# Patient Record
Sex: Male | Born: 1950 | Race: Black or African American | Hispanic: No | Marital: Single | Smoking: Former smoker
Health system: Southern US, Community
[De-identification: ages and names within clinical notes are randomized; demographics above are authoritative.]

## PROBLEM LIST (undated history)

## (undated) ENCOUNTER — Emergency Department: Discharge status: Absent without leave | Payer: Medicare Other

## (undated) DIAGNOSIS — Z87891 Personal history of nicotine dependence: Principal | ICD-10-CM

## (undated) DIAGNOSIS — M1990 Unspecified osteoarthritis, unspecified site: Secondary | ICD-10-CM

## (undated) DIAGNOSIS — C109 Malignant neoplasm of oropharynx, unspecified: Secondary | ICD-10-CM

## (undated) DIAGNOSIS — E119 Type 2 diabetes mellitus without complications: Secondary | ICD-10-CM

## (undated) HISTORY — DX: Personal history of nicotine dependence: Z87.891

## (undated) HISTORY — DX: Malignant neoplasm of oropharynx, unspecified: C10.9

## (undated) HISTORY — DX: Unspecified osteoarthritis, unspecified site: M19.90

## (undated) HISTORY — DX: Type 2 diabetes mellitus without complications: E11.9

---

## 1992-07-05 HISTORY — PX: THORACIC DISC SURGERY: SHX801

## 2007-07-06 DIAGNOSIS — C109 Malignant neoplasm of oropharynx, unspecified: Secondary | ICD-10-CM

## 2007-07-06 DIAGNOSIS — Z931 Gastrostomy status: Secondary | ICD-10-CM

## 2007-07-06 DIAGNOSIS — K9423 Gastrostomy malfunction: Secondary | ICD-10-CM

## 2007-07-06 HISTORY — DX: Malignant neoplasm of oropharynx, unspecified: C10.9

## 2007-07-12 ENCOUNTER — Emergency Department: Payer: Self-pay

## 2008-02-04 ENCOUNTER — Other Ambulatory Visit: Payer: Self-pay

## 2008-02-04 ENCOUNTER — Emergency Department: Payer: Self-pay | Admitting: Internal Medicine

## 2008-06-20 ENCOUNTER — Ambulatory Visit: Payer: Self-pay | Admitting: Gastroenterology

## 2015-08-29 ENCOUNTER — Ambulatory Visit: Payer: Self-pay | Admitting: Internal Medicine

## 2015-09-12 ENCOUNTER — Inpatient Hospital Stay: Payer: Medicare HMO | Attending: Internal Medicine | Admitting: Internal Medicine

## 2015-09-12 VITALS — BP 156/94 | HR 65 | Temp 95.8°F | Resp 18 | Wt 156.1 lb

## 2015-09-12 DIAGNOSIS — Z87891 Personal history of nicotine dependence: Secondary | ICD-10-CM | POA: Diagnosis not present

## 2015-09-12 DIAGNOSIS — E119 Type 2 diabetes mellitus without complications: Secondary | ICD-10-CM | POA: Insufficient documentation

## 2015-09-12 DIAGNOSIS — M26609 Unspecified temporomandibular joint disorder, unspecified side: Secondary | ICD-10-CM

## 2015-09-12 DIAGNOSIS — Z85819 Personal history of malignant neoplasm of unspecified site of lip, oral cavity, and pharynx: Secondary | ICD-10-CM | POA: Diagnosis not present

## 2015-09-12 DIAGNOSIS — C760 Malignant neoplasm of head, face and neck: Secondary | ICD-10-CM

## 2015-09-12 DIAGNOSIS — Z923 Personal history of irradiation: Secondary | ICD-10-CM | POA: Insufficient documentation

## 2015-09-12 DIAGNOSIS — Z79899 Other long term (current) drug therapy: Secondary | ICD-10-CM | POA: Diagnosis not present

## 2015-09-12 DIAGNOSIS — Z8042 Family history of malignant neoplasm of prostate: Secondary | ICD-10-CM | POA: Insufficient documentation

## 2015-09-12 DIAGNOSIS — Z9221 Personal history of antineoplastic chemotherapy: Secondary | ICD-10-CM | POA: Insufficient documentation

## 2015-09-12 NOTE — Progress Notes (Signed)
Patient here today as new evaluation regarding hx of throat cancer.  Referred by Dr. Brynda Greathouse.  Patient initially treated @ [facility].

## 2015-09-12 NOTE — Progress Notes (Signed)
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 Couch]; PET 2009- NED.   # 2009- ? Lung nodules ~1cm [on follow up PET]- March 2017-refer to Lung cancer screening program  # ? Bil TMJ  HISTORY OF PRESENTING ILLNESS:  Louis Stone 65 y.o.  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   Patient denies any unusual throat pain or difficulty swallowing or lumps or bumps. His appetite is good. Denies any  Chest pain or shortness of breath or cough.  Denies any dry mouth. Denies any tingling and numbness of his extremity.   Patient denies any unusual fatigue or tiredness.   No constipation.  ROS: A complete 10 point review of system is done which is negative except mentioned above in history of present illness  MEDICAL HISTORY:  Past Medical History  Diagnosis Date  . Oropharynx cancer ([place]) 2009    squamous cell cancer  Stage T3, N2c, M0  . Diabetes mellitus without complication ([place])   . Arthritis     per pt. I do not have md notes that indicate dx.    SURGICAL HISTORY: Past Surgical History  Procedure Laterality Date  . Thoracic disc surgery  1994    pt states ruptured disc and had surgery to repair it    SOCIAL HISTORY:  2 packs a day for  40 years;  Quit in 2008. Quit alcohol in 1991. Social History   Social History  . Marital Status: Single    Spouse Name: N/A  . Number of Children: N/A  . Years of Education: N/A   Occupational History  . Not on file.   Social History Main Topics  . Smoking status: Former Smoker -- 2.00 packs/day for 40 years    Quit date: 07/05/2006   . Smokeless tobacco: Not on file  . Alcohol Use: No     Comment: quit in 1991  . Drug Use: Not on file  . Sexual Activity: Not on file   Other Topics Concern  . Not on file   Social History Narrative  . No narrative on file    FAMILY HISTORY:  Prostate cancer brother. Family History  Problem Relation Age of Onset  . Prostate cancer Neg Hx     not sure what family member    ALLERGIES:  has no allergies on file.  MEDICATIONS:  Current Outpatient Prescriptions  Medication Sig Dispense Refill  . traMADol (ULTRAM) 50 MG tablet Take by mouth every 6 (six) hours as needed.    Nelva Nay SOLOSTAR 300 UNIT/ML SOPN      No current facility-administered medications for this visit.      Marland Kitchen  PHYSICAL EXAMINATION: ECOG PERFORMANCE STATUS: 0 - Asymptomatic  Filed Vitals:   09/12/15 1120  BP: 156/94  Pulse: 65  Temp: 95.8 F (35.4 C)  Resp: 18   Filed Weights   09/12/15 1120  Weight: 156 lb 1.4 oz (70.8 kg)    GENERAL: Well-nourished well-developed; Alert, no distress and comfortable. Alone.  EYES: no pallor or icterus OROPHARYNX: no thrush or ulceration; dentures.  NECK: supple, no masses felt LYMPH:  no palpable lymphadenopathy in the  cervical, axillary or inguinal regions LUNGS: clear to auscultation and  No wheeze or crackles HEART/CVS: regular rate & rhythm and no murmurs; No lower extremity edema ABDOMEN: abdomen soft, non-tender and normal bowel sounds Musculoskeletal:no cyanosis of digits and no clubbing  PSYCH: alert & oriented x 3 with fluent speech NEURO: no focal motor/sensory deficits SKIN:  no rashes or significant lesions  .  ASSESSMENT & PLAN:   #  Squamous cell carcinoma of the oropharynx-  Status post chemoradiation [ 2009].  Clinically no evidence of recurrence.  Recommend ENT evaluation.  Recommend CBC CMP TSH today.   #   History of smoking quit many years ago-  Patient is likely a candidate for  Lung cancer screening.  Patient is interested.   Will refer to the  Lung cancer screening program.  #  Patient follow-up with me  In 6 months/ no labs;  However  If labs today are abnormal he was see  Sooner.  # 30 minutes face-to-face with the patient discussing the above plan of care; more than 50% of time spent on counseling and coordination.  Thank you Dr. Brynda Greathouse  for allowing me to participate in the care of your pleasant patient. Please do not hesitate to contact me with questions or concerns in the interim.      Cammie Sickle, MD 09/16/2015 3:18 PM

## 2015-09-14 ENCOUNTER — Encounter: Payer: Self-pay | Admitting: Internal Medicine

## 2015-09-15 ENCOUNTER — Telehealth: Payer: Self-pay | Admitting: *Deleted

## 2015-09-15 NOTE — Telephone Encounter (Signed)
Called pt tto let him know about ENT appt with Dr. Pryor Ochoa on 10/01/15 3 pm arrival and 3:30 md visit.  I spoke to pt and told him this and then he asked what it will cost.  I told him that he needs to call them because depending on his insurance and if he has copay would be the deciding factor and I offered to give him the number and he will have to call me back he is in the middle of something and can't take down the phone number.  I had also asked him to come over and get labs done but he says he has them all the time at [place] office.  I called there and got latest labs 02/03/2015 and I showed them to Dr. B and he is ok with them.

## 2015-09-22 NOTE — Telephone Encounter (Signed)
Called back on 3/15 and pt was able to take down phone number of ENT in case he had questions. He already knew where to go because when he was in our clinic I took him to the window and showed him the [facility] where ENT was located.

## 2015-10-03 ENCOUNTER — Encounter: Payer: Self-pay | Admitting: *Deleted

## 2015-10-08 ENCOUNTER — Inpatient Hospital Stay: Payer: Medicare HMO | Attending: Family Medicine | Admitting: Family Medicine

## 2015-10-08 ENCOUNTER — Ambulatory Visit
Admission: RE | Admit: 2015-10-08 | Discharge: 2015-10-08 | Disposition: A | Discharge status: Home / Self Care | Payer: Medicare HMO | Source: Ambulatory Visit | Attending: Family Medicine | Admitting: Family Medicine

## 2015-10-08 ENCOUNTER — Encounter: Payer: Self-pay | Admitting: Family Medicine

## 2015-10-08 ENCOUNTER — Other Ambulatory Visit: Payer: Self-pay | Admitting: Family Medicine

## 2015-10-08 DIAGNOSIS — Z87891 Personal history of nicotine dependence: Secondary | ICD-10-CM

## 2015-10-08 DIAGNOSIS — Z122 Encounter for screening for malignant neoplasm of respiratory organs: Secondary | ICD-10-CM | POA: Diagnosis not present

## 2015-10-08 HISTORY — DX: Personal history of nicotine dependence: Z87.891

## 2015-10-08 NOTE — Progress Notes (Signed)
In accordance with CMS guidelines, patient has meet eligibility criteria including age, absence of signs or symptoms of lung cancer, the specific calculation of cigarette smoking pack-years was 60 years and is a former smoker having quit 9 years ago.   A shared decision-making session was conducted prior to the performance of CT scan. This includes one or more decision aids, includes benefits and harms of screening, follow-up diagnostic testing, over-diagnosis, false positive rate, and total radiation exposure.  Counseling on the importance of adherence to annual lung cancer LDCT screening, impact of co-morbidities, and ability or willingness to undergo diagnosis and treatment is imperative for compliance of the program.  Counseling on the importance of continued smoking cessation for former smokers; the importance of smoking cessation for current smokers and information about tobacco cessation interventions have been given to patient including the [place] at [facility], 1800 quit [place], as well as [place] specific smoking cessation programs.  Written order for lung cancer screening with LDCT has been given to the patient and any and all questions have been answered to the best of my abilities.   Yearly follow up will be scheduled by Burgess Estelle, Thoracic Navigator.

## 2015-10-10 ENCOUNTER — Telehealth: Payer: Self-pay | Admitting: *Deleted

## 2015-10-10 NOTE — Telephone Encounter (Signed)
Notified patient of LDCT lung cancer screening results of Lung [place] 1 finding with recommendation for 12 month follow up imaging. Patient verbalizes understanding.

## 2016-03-15 ENCOUNTER — Inpatient Hospital Stay: Payer: Medicare HMO | Admitting: Internal Medicine

## 2016-04-02 ENCOUNTER — Ambulatory Visit: Payer: Medicare HMO | Admitting: Internal Medicine

## 2016-09-16 ENCOUNTER — Inpatient Hospital Stay
Admission: EM | Admit: 2016-09-16 | Discharge: 2016-09-18 | DRG: 871 | Disposition: A | Discharge status: Home Health | Payer: Medicare HMO | Attending: Internal Medicine | Admitting: Internal Medicine

## 2016-09-16 ENCOUNTER — Emergency Department: Payer: Medicare HMO

## 2016-09-16 ENCOUNTER — Encounter: Payer: Self-pay | Admitting: Emergency Medicine

## 2016-09-16 DIAGNOSIS — Z87891 Personal history of nicotine dependence: Secondary | ICD-10-CM

## 2016-09-16 DIAGNOSIS — E870 Hyperosmolality and hypernatremia: Secondary | ICD-10-CM | POA: Diagnosis present

## 2016-09-16 DIAGNOSIS — E860 Dehydration: Secondary | ICD-10-CM | POA: Diagnosis present

## 2016-09-16 DIAGNOSIS — Z79899 Other long term (current) drug therapy: Secondary | ICD-10-CM | POA: Diagnosis not present

## 2016-09-16 DIAGNOSIS — E1165 Type 2 diabetes mellitus with hyperglycemia: Secondary | ICD-10-CM | POA: Diagnosis present

## 2016-09-16 DIAGNOSIS — R6520 Severe sepsis without septic shock: Secondary | ICD-10-CM | POA: Diagnosis present

## 2016-09-16 DIAGNOSIS — Z7951 Long term (current) use of inhaled steroids: Secondary | ICD-10-CM | POA: Diagnosis not present

## 2016-09-16 DIAGNOSIS — A419 Sepsis, unspecified organism: Principal | ICD-10-CM | POA: Diagnosis present

## 2016-09-16 DIAGNOSIS — J189 Pneumonia, unspecified organism: Secondary | ICD-10-CM

## 2016-09-16 DIAGNOSIS — N179 Acute kidney failure, unspecified: Secondary | ICD-10-CM | POA: Diagnosis present

## 2016-09-16 DIAGNOSIS — E11649 Type 2 diabetes mellitus with hypoglycemia without coma: Secondary | ICD-10-CM | POA: Diagnosis present

## 2016-09-16 DIAGNOSIS — R739 Hyperglycemia, unspecified: Secondary | ICD-10-CM

## 2016-09-16 DIAGNOSIS — R531 Weakness: Secondary | ICD-10-CM

## 2016-09-16 DIAGNOSIS — C760 Malignant neoplasm of head, face and neck: Secondary | ICD-10-CM

## 2016-09-16 DIAGNOSIS — J181 Lobar pneumonia, unspecified organism: Secondary | ICD-10-CM

## 2016-09-16 DIAGNOSIS — Z85818 Personal history of malignant neoplasm of other sites of lip, oral cavity, and pharynx: Secondary | ICD-10-CM | POA: Diagnosis not present

## 2016-09-16 LAB — URINALYSIS, COMPLETE (UACMP) WITH MICROSCOPIC
Bacteria, UA: NONE SEEN
Bilirubin Urine: NEGATIVE
Glucose, UA: >=500 mg/dL — AB
Ketones, ur: 5 mg/dL — AB
Leukocytes, UA: NEGATIVE
Nitrite: NEGATIVE
Protein, ur: NEGATIVE mg/dL
Specific Gravity, Urine: 1.027 (ref 1.005–1.030)
Squamous Epithelial / LPF: NONE SEEN
pH: 5 (ref 5.0–8.0)

## 2016-09-16 LAB — GLUCOSE, CAPILLARY
Glucose-Capillary: 353 mg/dL — ABNORMAL HIGH (ref 65–99)
Glucose-Capillary: 298 mg/dL — ABNORMAL HIGH (ref 65–99)
Glucose-Capillary: 254 mg/dL — ABNORMAL HIGH (ref 65–99)
Glucose-Capillary: 246 mg/dL — ABNORMAL HIGH (ref 65–99)

## 2016-09-16 LAB — CBC WITH DIFFERENTIAL/PLATELET
Basophils Absolute: 0.1 10*3/uL (ref 0–0.1)
Basophils Relative: 0 %
Eosinophils Absolute: 0 10*3/uL (ref 0–0.7)
Eosinophils Relative: 0 %
HCT: 43.5 % (ref 40.0–52.0)
Hemoglobin: 13.6 g/dL (ref 13.0–18.0)
Lymphocytes Relative: 3 %
Lymphs Abs: 0.6 10*3/uL — ABNORMAL LOW (ref 1.0–3.6)
MCH: 26.2 pg (ref 26.0–34.0)
MCHC: 31.2 g/dL — ABNORMAL LOW (ref 32.0–36.0)
MCV: 84 fL (ref 80.0–100.0)
Monocytes Absolute: 0.4 10*3/uL (ref 0.2–1.0)
Monocytes Relative: 3 %
Neutro Abs: 15.9 10*3/uL — ABNORMAL HIGH (ref 1.4–6.5)
Neutrophils Relative %: 94 %
Platelets: 295 10*3/uL (ref 150–440)
RBC: 5.17 MIL/uL (ref 4.40–5.90)
RDW: 16.6 % — ABNORMAL HIGH (ref 11.5–14.5)
WBC: 16.9 10*3/uL — ABNORMAL HIGH (ref 3.8–10.6)

## 2016-09-16 LAB — COMPREHENSIVE METABOLIC PANEL
ALT: 39 U/L (ref 17–63)
AST: 33 U/L (ref 15–41)
Albumin: 2.5 g/dL — ABNORMAL LOW (ref 3.5–5.0)
Alkaline Phosphatase: 225 U/L — ABNORMAL HIGH (ref 38–126)
Anion gap: 12 (ref 5–15)
BUN: 65 mg/dL — ABNORMAL HIGH (ref 6–20)
CO2: 23 mmol/L (ref 22–32)
Calcium: 8.7 mg/dL — ABNORMAL LOW (ref 8.9–10.3)
Chloride: 117 mmol/L — ABNORMAL HIGH (ref 101–111)
Creatinine, Ser: 1.53 mg/dL — ABNORMAL HIGH (ref 0.61–1.24)
GFR calc Af Amer: 54 mL/min — ABNORMAL LOW (ref >60)
GFR calc non Af Amer: 46 mL/min — ABNORMAL LOW (ref >60)
Glucose, Bld: 553 mg/dL (ref 65–99)
Potassium: 4.4 mmol/L (ref 3.5–5.1)
Sodium: 152 mmol/L — ABNORMAL HIGH (ref 135–145)
Total Bilirubin: 1.4 mg/dL — ABNORMAL HIGH (ref 0.3–1.2)
Total Protein: 7.6 g/dL (ref 6.5–8.1)

## 2016-09-16 LAB — LACTIC ACID, PLASMA: Lactic Acid, Venous: 1.9 mmol/L (ref 0.5–1.9)

## 2016-09-16 LAB — ETHANOL: Alcohol, Ethyl (B): <5 mg/dL (ref <5)

## 2016-09-16 LAB — LIPASE, BLOOD: Lipase: 34 U/L (ref 11–51)

## 2016-09-16 MED ORDER — INSULIN ASPART 100 UNIT/ML ~~LOC~~ SOLN
0.0000 [IU] | Freq: Three times a day (TID) | SUBCUTANEOUS | Status: DC
  Administered 2016-09-17: 7 [IU] via SUBCUTANEOUS
  Administered 2016-09-17 (×2): 5 [IU] via SUBCUTANEOUS
  Administered 2016-09-18 (×2): 3 [IU] via SUBCUTANEOUS
  Filled 2016-09-16: qty 7
  Filled 2016-09-16: qty 3
  Filled 2016-09-16 (×2): qty 5
  Filled 2016-09-16: qty 3

## 2016-09-16 MED ORDER — SODIUM CHLORIDE 0.9 % IV BOLUS (SEPSIS)
1000.0000 mL | Freq: Once | INTRAVENOUS | Status: AC
  Administered 2016-09-16: 1000 mL via INTRAVENOUS

## 2016-09-16 MED ORDER — INSULIN ASPART 100 UNIT/ML ~~LOC~~ SOLN
0.0000 [IU] | Freq: Every day | SUBCUTANEOUS | Status: DC
  Administered 2016-09-16 – 2016-09-17 (×2): 2 [IU] via SUBCUTANEOUS
  Filled 2016-09-16 (×2): qty 2

## 2016-09-16 MED ORDER — HEPARIN SODIUM (PORCINE) 5000 UNIT/ML IJ SOLN
5000.0000 [IU] | Freq: Three times a day (TID) | INTRAMUSCULAR | Status: DC
  Administered 2016-09-16 – 2016-09-18 (×6): 5000 [IU] via SUBCUTANEOUS
  Filled 2016-09-16 (×6): qty 1

## 2016-09-16 MED ORDER — CEFTRIAXONE SODIUM 1 G IJ SOLR: 1.0000 g | Freq: Once | INTRAMUSCULAR | Status: DC

## 2016-09-16 MED ORDER — DEXTROSE 5 % IV SOLN: INTRAVENOUS | Status: DC

## 2016-09-16 MED ORDER — DEXTROSE-NACL 5-0.45 % IV SOLN
INTRAVENOUS | Status: DC
  Administered 2016-09-16: 22:00:00 via INTRAVENOUS

## 2016-09-16 MED ORDER — INSULIN GLARGINE 100 UNIT/ML ~~LOC~~ SOLN
10.0000 [IU] | Freq: Every day | SUBCUTANEOUS | Status: DC
  Administered 2016-09-16: 10 [IU] via SUBCUTANEOUS
  Filled 2016-09-16 (×2): qty 0.1

## 2016-09-16 MED ORDER — CEFTRIAXONE SODIUM-DEXTROSE 1-3.74 GM-% IV SOLR
1.0000 g | Freq: Once | INTRAVENOUS | Status: AC
  Administered 2016-09-16: 1 g via INTRAVENOUS
  Filled 2016-09-16: qty 50

## 2016-09-16 MED ORDER — DEXTROSE 5 % IV SOLN: 500.0000 mg | INTRAVENOUS | Status: DC

## 2016-09-16 MED ORDER — ACETAMINOPHEN 325 MG PO TABS
650.0000 mg | ORAL_TABLET | Freq: Four times a day (QID) | ORAL | Status: DC | PRN
  Administered 2016-09-18: 650 mg via ORAL
  Filled 2016-09-16: qty 2

## 2016-09-16 MED ORDER — SODIUM CHLORIDE 0.9% FLUSH
3.0000 mL | Freq: Two times a day (BID) | INTRAVENOUS | Status: DC
  Administered 2016-09-16 – 2016-09-18 (×4): 3 mL via INTRAVENOUS

## 2016-09-16 MED ORDER — ACETAMINOPHEN 650 MG RE SUPP: 650.0000 mg | Freq: Four times a day (QID) | RECTAL | Status: DC | PRN

## 2016-09-16 MED ORDER — ONDANSETRON HCL 4 MG/2ML IJ SOLN: 4.0000 mg | Freq: Four times a day (QID) | INTRAMUSCULAR | Status: DC | PRN

## 2016-09-16 MED ORDER — INSULIN ASPART 100 UNIT/ML ~~LOC~~ SOLN
10.0000 [IU] | Freq: Once | SUBCUTANEOUS | Status: AC
  Administered 2016-09-16: 10 [IU] via INTRAVENOUS
  Filled 2016-09-16: qty 10

## 2016-09-16 MED ORDER — DEXTROSE 5 % IV SOLN
1.0000 g | INTRAVENOUS | Status: DC
  Administered 2016-09-17: 1 g via INTRAVENOUS
  Filled 2016-09-16 (×2): qty 10

## 2016-09-16 MED ORDER — DEXTROSE 5 % IV SOLN
500.0000 mg | Freq: Once | INTRAVENOUS | Status: AC
  Administered 2016-09-16: 500 mg via INTRAVENOUS
  Filled 2016-09-16: qty 500

## 2016-09-16 MED ORDER — AZITHROMYCIN 250 MG PO TABS
250.0000 mg | ORAL_TABLET | Freq: Every day | ORAL | Status: DC
  Administered 2016-09-17 – 2016-09-18 (×2): 250 mg via ORAL
  Filled 2016-09-16 (×2): qty 1

## 2016-09-16 MED ORDER — ONDANSETRON HCL 4 MG PO TABS: 4.0000 mg | ORAL_TABLET | Freq: Four times a day (QID) | ORAL | Status: DC | PRN

## 2016-09-16 NOTE — ED Provider Notes (Signed)
[facility] Emergency Department Provider Note  ____________________________________________  Time seen: Approximately 11:33 AM  I have reviewed the triage vital signs and the nursing notes.   HISTORY  Chief Complaint Shortness of Breath and Weakness    HPI Abas Leicht is a 66 y.o. male who complains of generalized weakness for the past 3 weeks. He is feeling fatigued and having decreased oral intake as well. He is diabetic. Denies any alcohol use. Denies any pain anywhere.No falls or other trauma     Past Medical History:  Diagnosis Date  . Arthritis    per pt. I do not have md notes that indicate dx.  . Diabetes mellitus without complication ([place])   . Oropharynx cancer ([place]) 2009   squamous cell cancer  Stage T3, N2c, M0  . Personal history of tobacco use, presenting hazards to health 10/08/2015     Patient Active Problem List   Diagnosis Date Noted  . Personal history of tobacco use, presenting hazards to health 10/08/2015     Past Surgical History:  Procedure Laterality Date  . [place]   pt states ruptured disc and had surgery to repair it     Prior to Admission medications   Medication Sig Start Date End Date Taking? Authorizing Provider  albuterol (PROVENTIL HFA;VENTOLIN HFA) 108 (90 Base) MCG/ACT inhaler Inhale 2 puffs into the lungs.   Yes Historical Provider, MD  benzonatate (TESSALON) 200 MG capsule Take 200 mg by mouth 3 (three) times daily as needed for cough.   Yes Historical Provider, MD  doxycycline (VIBRA-TABS) 100 MG tablet Take 100 mg by mouth 2 (two) times daily.   Yes Historical Provider, MD  TOUJEO SOLOSTAR 300 UNIT/ML [facility]  07/16/15   Historical Provider, MD     Allergies Patient has no known allergies.   Family History  Problem Relation Age of Onset  . Prostate cancer Neg Hx     not sure what family member    Social History Social History  Substance Use Topics  . Smoking status:  Former Smoker    Packs/day: 1.50    Years: 40.00    Quit date: 07/05/2006  . Smokeless tobacco: Never Used  . Alcohol use No     Comment: quit in 1991    Review of Systems  Constitutional:   No fever or chills.  ENT:   No sore throat. No rhinorrhea. Cardiovascular:   No chest pain. Respiratory:   No dyspnea or cough. Gastrointestinal:   Negative for abdominal pain, vomiting and diarrhea.  Genitourinary:   Negative for dysuria or difficulty urinating. Musculoskeletal:   Negative for focal pain or swelling Neurological:   Negative for headaches 10-point ROS otherwise negative.  ____________________________________________   PHYSICAL EXAM:  VITAL SIGNS: ED Triage Vitals  Enc Vitals Group     BP 09/16/16 1048 (!) 123/95     Pulse --      Resp 09/16/16 1048 18     Temp 09/16/16 1048 99.2 F (37.3 C)     Temp Source 09/16/16 1048 Oral     SpO2 --      Weight 09/16/16 1053 145 lb (65.8 kg)     Height 09/16/16 1053 5\' 7"  (1.702 m)     Head Circumference --      Peak Flow --      Pain Score 09/16/16 1054 6     Pain Loc --      Pain Edu? --  Excl. in [place]? --     Vital signs reviewed, nursing assessments reviewed.   Constitutional:   Alert and oriented. Well appearing and in no distress. Eyes:   No scleral icterus. No conjunctival pallor. PERRL. EOMI.  No nystagmus. ENT   Head:   Normocephalic and atraumatic.   Nose:   No congestion/rhinnorhea. No septal hematoma   Mouth/Throat:   Dry mucous membranes. Dentures, no pharyngeal erythema. No peritonsillar mass. Fruity breath   Neck:   No stridor. No SubQ emphysema. No meningismus. Hematological/Lymphatic/Immunilogical:   No cervical lymphadenopathy. Cardiovascular:   RRR. Symmetric bilateral radial and DP pulses.  No murmurs.  Respiratory:   Normal respiratory effort without tachypnea nor retractions. Breath sounds are clear and equal bilaterally. No wheezes/rales/rhonchi. Gastrointestinal:   Soft and  nontender. Non distended. There is no CVA tenderness.  No rebound, rigidity, or guarding. Genitourinary:   deferred Musculoskeletal:   Normal range of motion in all extremities. No joint effusions.  No lower extremity tenderness.  No edema. Neurologic:   Normal speech and language.  CN 2-10 normal. Motor grossly intact. No gross focal neurologic deficits are appreciated.  Skin:    Skin is warm, dry and intact. No rash noted.  No petechiae, purpura, or bullae.  ____________________________________________    LABS (pertinent positives/negatives) (all labs ordered are listed, but only abnormal results are displayed) Labs Reviewed  COMPREHENSIVE METABOLIC PANEL - Abnormal; Notable for the following:       Result Value   Sodium 152 (*)    Chloride 117 (*)    Glucose, Bld 553 (*)    BUN 65 (*)    Creatinine, Ser 1.53 (*)    Calcium 8.7 (*)    Albumin 2.5 (*)    Alkaline Phosphatase 225 (*)    Total Bilirubin 1.4 (*)    GFR calc non Af Amer 46 (*)    GFR calc Af Amer 54 (*)    All other components within normal limits  CBC WITH DIFFERENTIAL/PLATELET - Abnormal; Notable for the following:    WBC 16.9 (*)    MCHC 31.2 (*)    RDW 16.6 (*)    Neutro Abs 15.9 (*)    Lymphs Abs 0.6 (*)    All other components within normal limits  ETHANOL  LACTIC ACID, PLASMA  LIPASE, BLOOD  URINALYSIS, COMPLETE (UACMP) WITH MICROSCOPIC   ____________________________________________   EKG    ____________________________________________    RADIOLOGY  Dg Chest 2 View  Result Date: 09/16/2016 CLINICAL DATA:  Shortness of Breath EXAM: CHEST  2 VIEW COMPARISON:  Chest radiograph July 12, 2007 and chest CT October 08, 2015 FINDINGS: There is patchy airspace disease in the left lower lobe. Lungs elsewhere are clear. Heart size and pulmonary vascularity are normal. No adenopathy. No bone lesions. IMPRESSION: Left lower lobe patchy airspace disease consistent with pneumonia. Lungs elsewhere clear. No  adenopathy evident. Followup PA and lateral chest radiographs recommended in 3-4 weeks following trial of antibiotic therapy to ensure resolution and exclude underlying malignancy. Electronically Signed   By: Lowella Grip III M.D.   On: 09/16/2016 11:38    ____________________________________________   PROCEDURES Procedures  ____________________________________________   INITIAL IMPRESSION / ASSESSMENT AND PLAN / ED COURSE  Pertinent labs & imaging results that were available during my care of the patient were reviewed by me and considered in my medical decision making (see chart for details).  Patient not in distress, presents with generalized weakness. Ketotic smell on his breath. We'll check  lactate and labs, IV fluids. Concern for DKA.   ----------------------------------------- 3:24 PM on 09/16/2016 -----------------------------------------  Patient reports feeling better after 1 L IV fluids, but labs showed hemoconcentration. Still mildly tachycardic with a heart rate of 100. We'll give a second liter of IV fluids. Glucose is 550 on labs, we'll give IV insulin. Recheck sugar and reassess after IV fluids for sympathetic improvement. If not, may need hospitalization for failure of outpatient treatment for his community-acquired pneumonia with doxycycline.no acidosis on labs.  patient reexamined, abdomen is completely nontender.  cAre patient signed out to Dr. Dineen Kid for reassessment.      ____________________________________________   FINAL CLINICAL IMPRESSION(S) / ED DIAGNOSES  Final diagnoses:  Generalized weakness  Dehydration  Hyperglycemia      New Prescriptions   No medications on file     Portions of this note were generated with dragon dictation software. Dictation errors may occur despite best attempts at proofreading.    Carrie Mew, MD 09/16/16 [phone]

## 2016-09-16 NOTE — ED Triage Notes (Signed)
Patient presents to the ED with increased weakness and shortness of breath.  Patient has been diagnosed with pneumonia and rib fracture and has been taking doxycycline and using an inhaler.  Patient's FSBS was 416 per EMS.  Patient has not taken his insulin yet this morning.  Patient states, "I don't feel like I'm getting better."  Patient states he called his doctor and the doctor advised patient he needed to come to the ED.

## 2016-09-16 NOTE — ED Notes (Signed)
Admitting MD at bedside.

## 2016-09-16 NOTE — Progress Notes (Signed)
Patient admitted with elevated blood sugars and hypernatremia. Started on D5 fluids-change the fluids to D5 half normal saline and decrease the rate as sugars are elevated. -Seems like new onset diabetes mellitus. Along with sliding scale, will add low-dose Lantus at this time. Diabetes coordinator to follow up in the a.m.

## 2016-09-16 NOTE — H&P (Signed)
[facility] [facility] [place] NAME: Louis Stone    MR#:  [id number]  DATE OF BIRTH:  03/07/1951  DATE OF ADMISSION:  09/16/2016  PRIMARY CARE PHYSICIAN: Marden Noble, MD   REQUESTING/REFERRING PHYSICIAN: Dr. Larae Grooms  CHIEF COMPLAINT:   Chief Complaint  Patient presents with  . Shortness of Breath  . Weakness    HISTORY OF PRESENT ILLNESS:  Louis Stone  is a 66 y.o. male with a known history of Osteoarthritis, diabetes, history of oral cancer who presents to the hospital due to weakness, cough and shortness of breath and noted to have pneumonia. Patient is difficult to understand due to his history of oral cancer. Patient says that he has been having shortness of breath on exertion over the past few days, feeling weak, admits to a cough which is nonproductive but no fever. He presented to the emergency room and noted to have chest x-ray findings suggestive of a left lower lobe pneumonia. Patient was also noted to be hypoglycemic, hypernatremic. Hospitalist services were contacted further treatment and evaluation. Patient denies any chest pain, abdominal pain, nausea, vomiting, diaphoresis, palpitations, syncope, hematuria, weight loss. He does admit to poor by mouth intake.  PAST MEDICAL HISTORY:   Past Medical History:  Diagnosis Date  . Arthritis    per pt. I do not have md notes that indicate dx.  . Diabetes mellitus without complication ([place])   . Oropharynx cancer ([place]) 2009   squamous cell cancer  Stage T3, N2c, M0  . Personal history of tobacco use, presenting hazards to health 10/08/2015    PAST SURGICAL HISTORY:   Past Surgical History:  Procedure Laterality Date  . [place]   pt states ruptured disc and had surgery to repair it    SOCIAL HISTORY:   Social History  Substance Use Topics  . Smoking status: Former Smoker    Packs/day: 1.50    Years: 40.00    Quit date: 07/05/2006  .  Smokeless tobacco: Never Used  . Alcohol use No     Comment: quit in 1991    FAMILY HISTORY:   Family History  Problem Relation Age of Onset  . CVA Father   . Prostate cancer Neg Hx     not sure what family member    DRUG ALLERGIES:  No Known Allergies  REVIEW OF SYSTEMS:   Review of Systems  Constitutional: Negative for fever and weight loss.  HENT: Negative for congestion, nosebleeds and tinnitus.   Eyes: Negative for blurred vision, double vision and redness.  Respiratory: Positive for cough and shortness of breath. Negative for hemoptysis.   Cardiovascular: Negative for chest pain, orthopnea, leg swelling and PND.  Gastrointestinal: Negative for abdominal pain, diarrhea, melena, nausea and vomiting.  Genitourinary: Negative for dysuria, hematuria and urgency.  Musculoskeletal: Negative for falls and joint pain.  Neurological: Positive for weakness. Negative for dizziness, tingling, sensory change, focal weakness, seizures and headaches.  Endo/Heme/Allergies: Negative for polydipsia. Does not bruise/bleed easily.  Psychiatric/Behavioral: Negative for depression and memory loss. The patient is not nervous/anxious.     MEDICATIONS AT HOME:   Prior to Admission medications   Medication Sig Start Date End Date Taking? Authorizing Provider  albuterol (PROVENTIL HFA;VENTOLIN HFA) 108 (90 Base) MCG/ACT inhaler Inhale 2 puffs into the lungs.   Yes Historical Provider, MD  benzonatate (TESSALON) 200 MG capsule Take 200 mg by mouth 3 (three) times daily as needed for cough.  Yes Historical Provider, MD  doxycycline (VIBRA-TABS) 100 MG tablet Take 100 mg by mouth 2 (two) times daily.   Yes Historical Provider, MD  TOUJEO SOLOSTAR 300 UNIT/ML [facility]  07/16/15   Historical Provider, MD      VITAL SIGNS:  Blood pressure (!) 157/77, pulse (!) 49, temperature 99.2 F (37.3 C), temperature source Oral, resp. rate (!) 22, height 5\' 7"  (1.702 m), weight 65.8 kg (145 lb), SpO2 95  %.  PHYSICAL EXAMINATION:  Physical Exam  GENERAL:  66 y.o.-year-old unkempt patient lying in bed in no acute distress.  EYES: Pupils equal, round, reactive to light and accommodation. No scleral icterus. Extraocular muscles intact.  HEENT: Head atraumatic, normocephalic. Oropharynx and nasopharynx clear. No oropharyngeal erythema, dry oral Mucosa.  NECK:  Supple, no jugular venous distention. No thyroid enlargement, no tenderness.  LUNGS: Prolonged Insp. & exp. phase, no wheezing, rales, rhonchi. No use of accessory muscles of respiration.  CARDIOVASCULAR: S1, S2 RRR. No murmurs, rubs, gallops, clicks.  ABDOMEN: Soft, nontender, nondistended. Bowel sounds present. No organomegaly or mass.  EXTREMITIES: No pedal edema, cyanosis, or clubbing. + 2 pedal & radial pulses b/l.   NEUROLOGIC: Cranial nerves II through XII are intact. No focal Motor or sensory deficits appreciated b/l PSYCHIATRIC: The patient is alert and oriented x 3. Good affect.  SKIN: No obvious rash, lesion, or ulcer.   LABORATORY PANEL:   CBC  Recent Labs Lab 09/16/16 1054  WBC 16.9*  HGB 13.6  HCT 43.5  PLT 295   ------------------------------------------------------------------------------------------------------------------  Chemistries   Recent Labs Lab 09/16/16 1054  NA 152*  K 4.4  CL 117*  CO2 23  GLUCOSE 553*  BUN 65*  CREATININE 1.53*  CALCIUM 8.7*  AST 33  ALT 39  ALKPHOS 225*  BILITOT 1.4*   ------------------------------------------------------------------------------------------------------------------  Cardiac Enzymes No results for input(s): TROPONINI in the last 168 hours. ------------------------------------------------------------------------------------------------------------------  RADIOLOGY:  Dg Chest 2 View  Result Date: 09/16/2016 CLINICAL DATA:  Shortness of Breath EXAM: CHEST  2 VIEW COMPARISON:  Chest radiograph July 12, 2007 and chest CT October 08, 2015 FINDINGS:  There is patchy airspace disease in the left lower lobe. Lungs elsewhere are clear. Heart size and pulmonary vascularity are normal. No adenopathy. No bone lesions. IMPRESSION: Left lower lobe patchy airspace disease consistent with pneumonia. Lungs elsewhere clear. No adenopathy evident. Followup PA and lateral chest radiographs recommended in 3-4 weeks following trial of antibiotic therapy to ensure resolution and exclude underlying malignancy. Electronically Signed   By: Lowella Grip III M.D.   On: 09/16/2016 11:38     IMPRESSION AND PLAN:   66 year old male with past medical history of diabetes, history of oral cancer, osteoarthritis who presented to the hospital due to weakness shortness of breath and noted to have a pneumonia.  1. Pneumonia-this is the cause of patient's shortness of breath and weakness. -I will treat the patient with IV ceftriaxone, Zithromax. Follow blood, sputum cultures.  2. Hypernatremia - will place on D5W and follow sodium.  - due to dehydration and poor PO intake.   3. Acute kidney injury-secondary to dehydration, will place an IV fluids, follow BUN/creatinine.  4. Diabetes type 2 without complication-patient's blood sugars are significantly uncontrolled. -I will place the patient on Insulin SS and get diabetes coordinator consult.  - check A1c.   5. Leukocytosis - due to # 1.  - follow with IV abx therapy.   7. Hx of Oral Cancer - will get a speech eval as  pt. Is difficult to understand.    All the records are reviewed and case discussed with ED provider. Management plans discussed with the patient, family and they are in agreement.  CODE STATUS: Full code  TOTAL TIME TAKING CARE OF THIS PATIENT: 45 minutes.    Henreitta Leber M.D on 09/16/2016 at 4:47 PM  Between 7am to 6pm - Pager - [phone]  After 6pm go to [email or web address] - password [email or web address] [facility]  Office  [phone]  CC: Primary care physician; Marden Noble, MD

## 2016-09-16 NOTE — Progress Notes (Addendum)
Pharmacy Antibiotic Note  Louis Stone is a 66 y.o. male admitted on 09/16/2016 with  pneumonia.  Pharmacy has been consulted for Ceftriaxone and Azithromycin dosing. Patient received 1 dose Ceftriaxone 1gm IV and Azithromycin 500mg  IV in ED. Patient failed outpatient therapy with doxycycline.   Plan: Azithromycin 500mg  x 1 given in ED, then Azithromycin 250mg  every 24 hours ordered. Will continue ceftriaxone 1gm IV every 24 hours.   Height: 5\' 7"  (170.2 cm) Weight: 145 lb (65.8 kg) IBW/kg (Calculated) : 66.1  Temp (24hrs), Avg:99.2 F (37.3 C), Min:99.2 F (37.3 C), Max:99.2 F (37.3 C)   Recent Labs Lab 09/16/16 1054  WBC 16.9*  CREATININE 1.53*  LATICACIDVEN 1.9    Estimated Creatinine Clearance: 45.4 mL/min (A) (by C-G formula based on SCr of 1.53 mg/dL (H)).    No Known Allergies  Antimicrobials this admission: 3/15 Azithromycin  >>  3/15 Ceftriaxone  >>   Dose adjustments this admission:  Microbiology results: 3/15 BCx: sent   Thank you for allowing pharmacy to be a part of this patient's care.  Pernell Dupre, PharmD, BCPS Clinical Pharmacist 09/16/2016 4:36 PM

## 2016-09-16 NOTE — Progress Notes (Signed)
MD notified of pt's elevated blood sugars and hypernatremia. Received order to change IVF from D5 @100  to D5 1/2 NS @60 .

## 2016-09-16 NOTE — ED Provider Notes (Signed)
Sign off from Dr. Joni Fears to reassess this patient after IV fluids. Patient already on doxycycline by mouth as an outpatient for pneumonia. However, the patient is not having any improvement and continues to feel weak and was found to be tachycardic and hemoconcentrated in the emergency department here.   Physical Exam  BP (!) 157/98   Pulse (!) 101   Temp 99.2 F (37.3 C) (Oral)   Resp (!) 21   Ht 5\' 7"  (1.702 m)   Wt 145 lb (65.8 kg)   SpO2 96%   BMI 22.71 kg/m  ----------------------------------------- 3:57 PM on 09/16/2016 -----------------------------------------   Physical Exam Patient resting comfortable without any distress. Heart rate still below 100. ED Course  Procedures  MDM Glucose is improved. Patient says that he still feels weak and lightheaded when he gets up to walk around. Failure medication and antibiotics with patient meeting sepsis criteria. Sepsis alert called. He'll be admitted to the hospital. Signed out to Dr. Tressia Miners. Patient is aware of the plan and willing to comply. We will treat with community acquired antibiotics.       Orbie Pyo, MD 09/16/16 [phone]

## 2016-09-17 LAB — CBC
HCT: 40.7 % (ref 40.0–52.0)
Hemoglobin: 12.9 g/dL — ABNORMAL LOW (ref 13.0–18.0)
MCH: 26.9 pg (ref 26.0–34.0)
MCHC: 31.6 g/dL — ABNORMAL LOW (ref 32.0–36.0)
MCV: 85 fL (ref 80.0–100.0)
Platelets: 217 10*3/uL (ref 150–440)
RBC: 4.79 MIL/uL (ref 4.40–5.90)
RDW: 16.5 % — ABNORMAL HIGH (ref 11.5–14.5)
WBC: 10.5 10*3/uL (ref 3.8–10.6)

## 2016-09-17 LAB — BASIC METABOLIC PANEL
Anion gap: 2 — ABNORMAL LOW (ref 5–15)
BUN: 38 mg/dL — ABNORMAL HIGH (ref 6–20)
CO2: 25 mmol/L (ref 22–32)
Calcium: 8.5 mg/dL — ABNORMAL LOW (ref 8.9–10.3)
Chloride: 126 mmol/L — ABNORMAL HIGH (ref 101–111)
Creatinine, Ser: 0.89 mg/dL (ref 0.61–1.24)
GFR calc Af Amer: >60 mL/min (ref >60)
GFR calc non Af Amer: >60 mL/min (ref >60)
Glucose, Bld: 278 mg/dL — ABNORMAL HIGH (ref 65–99)
Potassium: 3.9 mmol/L (ref 3.5–5.1)
Sodium: 153 mmol/L — ABNORMAL HIGH (ref 135–145)

## 2016-09-17 LAB — GLUCOSE, CAPILLARY
Glucose-Capillary: 282 mg/dL — ABNORMAL HIGH (ref 65–99)
Glucose-Capillary: 264 mg/dL — ABNORMAL HIGH (ref 65–99)
Glucose-Capillary: 339 mg/dL — ABNORMAL HIGH (ref 65–99)
Glucose-Capillary: 205 mg/dL — ABNORMAL HIGH (ref 65–99)

## 2016-09-17 LAB — HEMOGLOBIN A1C
Hgb A1c MFr Bld: 10.6 % — ABNORMAL HIGH (ref 4.8–5.6)
Mean Plasma Glucose: 258 mg/dL

## 2016-09-17 MED ORDER — INSULIN GLARGINE 100 UNIT/ML ~~LOC~~ SOLN
20.0000 [IU] | Freq: Every day | SUBCUTANEOUS | Status: DC
  Administered 2016-09-17: 20 [IU] via SUBCUTANEOUS
  Filled 2016-09-17 (×2): qty 0.2

## 2016-09-17 MED ORDER — POTASSIUM CL IN DEXTROSE 5% 20 MEQ/L IV SOLN
20.0000 meq | INTRAVENOUS | Status: DC
  Administered 2016-09-17 (×2): 20 meq via INTRAVENOUS
  Filled 2016-09-17 (×5): qty 1000

## 2016-09-17 MED ORDER — METOCLOPRAMIDE HCL 5 MG/5ML PO SOLN
10.0000 mg | Freq: Once | ORAL | Status: AC
  Administered 2016-09-17: 10 mg via ORAL
  Filled 2016-09-17: qty 10

## 2016-09-17 NOTE — Evaluation (Signed)
Clinical/Bedside Swallow Evaluation Patient Details  Name: Louis Stone MRN: [id number] Date of Birth: May 16, 1951  Today's Date: 09/17/2016 Time: SLP Start Time (ACUTE ONLY): 1300 SLP Stop Time (ACUTE ONLY): 1400 SLP Time Calculation (min) (ACUTE ONLY): 60 min  Past Medical History:  Past Medical History:  Diagnosis Date  . Arthritis    per pt. I do not have md notes that indicate dx.  . Diabetes mellitus without complication ([place])   . 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 Personal history of tobacco use, presenting hazards to health 10/08/2015   Past Surgical History:  Past Surgical History:  Procedure Laterality Date  . [place]   pt states ruptured disc and had surgery to repair it   HPI:  Pt is a 66 y.o. 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 Patient is difficult to understand due to his history of oral-pharynx cancer(baseline for pt per his report - left tonsillar and supraglottic region). Patient says that he has been having shortness of breath on exertion over the past few days, feeling weak, admits to a cough which is nonproductive but no fever. He presented to the emergency room and noted to have chest x-ray findings suggestive of a left lower lobe pneumonia. Patient was also noted to be hypoglycemic, hypernatremic. Pt does admit to fatigue and reduced oral intake by mouth since being sick. Pt does wear full dentures which he stated the upper plate is loose - prefers to use the powder adhesive to secure them.    Assessment / Plan / Recommendation Clinical Impression  Pt appeared to adequately tolerate trials of thin liquids via straw only, then trials of purees/softened solids, w/ no immediate, overt s/s of aspiration noted. He fed self his drinks taking  single, then multiple, sips w/ clear vocal quality and no decline in respiratory status following. Pt exhibits reduced mouth opening/excursion to take boluses d/t results of h/o oral-pharynx cancer(this presentation is baseline for pt since 2008-2009 per his report). Given time, pt was able to demo adequate oral phase management and oral clearing of the boluses; pt does have an upper denture plate that is too loose and impeded bolus manipulation which he removed - he prefers to use a powder adhesive from home to secure the plate. Pt fed self indepdendently w/ setup assist. Education was given on general aspiration precautions; food prep and options for better bolus management d/t decreased oral excursion and bolus mastication when not wearing the upper denture plate. Recommend a Dysphagia level 3 diet w/ GROUND or well-cut meats; thin liquids. Recommend pills in Puree if any difficulty swallowing w/ liquids. NSG updated.  SLP Visit Diagnosis: Dysphagia, oral phase (R13.11)    Aspiration Risk   (reduced when following general precautions)    Diet Recommendation  Dysphagia level 3 w/ well-chopped or Ground meats moistened; Thin liquids. General aspiration precautions.  Medication Administration: Whole meds with liquid (as tolerates)    Other  Recommendations Recommended Consults:  (Dietician f/u - drink supplement) Oral Care Recommendations: Oral care BID;Staff/trained caregiver to provide oral care   Follow up Recommendations None      Frequency and Duration            Prognosis Prognosis for Safe Diet Advancement: Good Barriers to Reach Goals: Time post onset (  oral-pharynx cancer)      Swallow Study   General Date of Onset: 09/16/16 HPI: Pt is a 66 y.o. 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 Patient is difficult to  understand due to his history of oral-pharynx cancer(baseline for pt per his report - left tonsillar and supraglottic region). Patient says that he has been having shortness of breath on exertion over the past few days, feeling weak, admits to a cough which is nonproductive but no fever. He presented to the emergency room and noted to have chest x-ray findings suggestive of a left lower lobe pneumonia. Patient was also noted to be hypoglycemic, hypernatremic. Pt does admit to fatigue and reduced oral intake by mouth since being sick. Pt does wear full dentures which he stated the upper plate is loose - prefers to use the powder adhesive to secure them.  Type of Study: Bedside Swallow Evaluation Previous Swallow Assessment: none reported Diet Prior to this Study: Dysphagia 3 (soft);Thin liquids (soft foods for easier mastication - less meat/bread) Temperature Spikes Noted: No (wbc not elevated 10.5) Respiratory Status: Room air History of Recent Intubation: No Behavior/Cognition: Alert;Cooperative;Pleasant mood Oral Cavity Assessment: Within Functional Limits (min limited) Oral Care Completed by SLP: Recent completion by staff Oral Cavity - Dentition: Dentures, top;Dentures, bottom (loose upper dentition) Vision: Functional for self-feeding Self-Feeding Abilities: Able to feed self Patient Positioning: Upright in bed Baseline Vocal Quality: Normal Volitional Cough: Strong Volitional Swallow: Able to elicit    Oral/Motor/Sensory Function Overall Oral Motor/Sensory Function: Mild impairment Facial ROM: Within Functional Limits Facial Symmetry: Within Functional Limits Facial Strength: Within Functional Limits Lingual ROM:  (min reduced protrusion) Lingual Symmetry: Within Functional Limits Lingual Strength: Within Functional Limits Velum:  (CNT) Mandible: Impaired (reduced ROM (baseline))   Ice Chips Ice chips: Not tested Other Comments: already drinking liquids in room   Thin Liquid Thin  Liquid: Within functional limits Presentation: Self Fed;Straw ((baseline) ~10 ozs total)    Nectar Thick Nectar Thick Liquid: Not tested   Honey Thick Honey Thick Liquid: Not tested   Puree Puree: Impaired Presentation: Self Fed;Spoon (8 trials) Oral Phase Impairments:  (reduced mouth opening/excursion to take boluses) Oral Phase Functional Implications:  (none) Pharyngeal Phase Impairments:  (none) Other Comments: loose upper denture plate; reduced mouth opening/excursion to take boluses   Solid   GO   Solid: Impaired Presentation: Self Fed;Spoon (2 trials) Oral Phase Impairments: Impaired mastication (reduced mouth opening/excursion to take boluses) Oral Phase Functional Implications: Impaired mastication (lacking upper denture plate; gumming food) Pharyngeal Phase Impairments:  (none) Other Comments: ate more puree foods; Ensure supplement          Orinda Kenner, MS, CCC-SLP Watson,Katherine 09/17/2016,2:43 PM

## 2016-09-17 NOTE — Progress Notes (Signed)
Inpatient Diabetes Program Recommendations  AACE/ADA: New Consensus Statement on Inpatient Glycemic Control (2015)  Target Ranges:  Prepandial:   less than 140 mg/dL      Peak postprandial:   less than 180 mg/dL (1-2 hours)      Critically ill patients:  140 - 180 mg/dL   Lab Results  Component Value Date   GLUCAP 282 (H) 09/17/2016   HGBA1C 10.6 (H) 09/16/2016    Review of Glycemic Control  Results for Louis Stone, Louis Stone (MRN [id number]) as of 09/17/2016 09:04  Ref. Range 09/16/2016 15:48 09/16/2016 17:06 09/16/2016 19:57 09/16/2016 21:13 09/17/2016 07:52  Glucose-Capillary Latest Ref Range: 65 - 99 mg/dL 353 (H) 298 (H) 254 (H) 246 (H) 282 (H)    Diabetes history: Type 2 since 2009 Outpatient Diabetes medications: Taking Toujeo insulin 10 units qam Current orders for Inpatient glycemic control: Lantus 10 units qhs, Novolog 0-9 units tid, Novolog 0-5 units qhs  Inpatient Diabetes Program Recommendations:  Please consider increasing Lantus to 20 units qhs (0.30units/kg)- fasting blood sugar remains elevated.   Spoke to Louis Stone- he tells me he is taking 10 units of insulin every morning but could not tell me the type.  Spoke to Dr. Jerene Dilling office where the patient is cared for- the last note states the patient was ordered 20 units Toujeo qam.   Consider adding Novolog 3 units tid with meals (hold if the patient eats less than 50%).   Consider increasing Novolog correction to moderate correction 0-15 units tid.  Continue Novolog 0-5 units qhs as ordered.   Gentry Fitz, RN, BA, MHA, CDE Diabetes Coordinator Inpatient Diabetes Program  [phone] (Team Pager) [phone] ([place]) 09/17/2016 9:54 AM

## 2016-09-17 NOTE — Evaluation (Signed)
Physical Therapy Evaluation Patient Details Name: Louis Stone MRN: [id number] DOB: 12/29/1950 Today's Date: 09/17/2016   History of Present Illness  Pt came into hospital increased weakness, cough and SOB, diagnosed w/ sepsis due to L Lower Lobe pneumonia, also found to have elevated blood glucose and hypernatremia. PMH includes; OA, Diabetes, and oral cancer    Clinical Impression  Pt cognitively intact and willing to participate in PT evaluation, he is somewhat difficult to understand secondary to history of oral cancer. Pt lives with his wife and is active at baseline, independent in all ADLs and IADLs, drives independently, performs yard work and goes on occasional bike rides Overall strength appears WFLs for tasks assessed and patient able to perform bed mobility and transfers independently. Attempted ambulating w/o AD and he appeared unsteady with increased lateral swaying and required min assist to maintain balance, then attempted ambulating w/ RW and he appeared more stable and able to walk around nursing station w/ increased gait speed and min guarding for safety. Pt complained of bilat foot pain in standing that is uncommon for him at baseline. Pt able to perform toileting and hand washing under PT supervision. Overall pt displays decreased balance and appears unsteady during ambulation, he will benefit from skilled PT to return to his baseline level of functioning. Recommend HHPT following acute hospitalization.      Follow Up Recommendations Home health PT    Equipment Recommendations  Rolling walker with 5" wheels    Recommendations for Other Services       Precautions / Restrictions Precautions Precautions: Fall Restrictions Weight Bearing Restrictions: No      Mobility  Bed Mobility Overal bed mobility: Independent             General bed mobility comments: able to roll over and move to sitting at EOB w/o andy difficulties   Transfers Overall transfer  level: Needs assistance Equipment used: Rolling walker (2 wheeled) Transfers: Sit to/from Stand Sit to Stand: Supervision         General transfer comment: pt transfers w/ use of B UE, appears unsteady w/o AD  Ambulation/Gait Ambulation/Gait assistance: Min guard Ambulation Distance (Feet): 50 Feet Assistive device: None Gait Pattern/deviations: Decreased step length - right;Decreased step length - left;Decreased stride length;Antalgic   Gait velocity interpretation: Below normal speed for age/gender General Gait Details: pt attempted ambulating w/o AD, appeared unstable w/ increased pain in his feet stated this was not normal for him, required min assist to maintain balance, then ambulated w/ RW and pt displayed improved stability and able to walk around nursing station   Stairs            Wheelchair Mobility    Modified Rankin (Stroke Patients Only)       Balance Overall balance assessment: Needs assistance Sitting-balance support: No upper extremity supported;Feet supported Sitting balance-Leahy Scale: Good Sitting balance - Comments: able to maintain upright seated posture w/o back support   Standing balance support: No upper extremity supported Standing balance-Leahy Scale: Fair Standing balance comment: slighty unstable standing requires hands on assist due to lateral swaying and bilat foot pain, improved standing balance w/ use of RW                             Pertinent Vitals/Pain Pain Assessment: Faces Faces Pain Scale: Hurts little more Pain Location: R foot palmar side Pain Descriptors / Indicators: Aching Pain Intervention(s): Limited activity within patient's tolerance;Monitored during session;Repositioned  Home Living Family/patient expects to be discharged to:: Private residence Living Arrangements: Spouse/significant other Available Help at Discharge: Family;Available PRN/intermittently Type of Home: House Home Access: Stairs to  enter Entrance Stairs-Rails: Left Entrance Stairs-Number of Steps: 3 Home Layout: One level Home Equipment: None      Prior Function Level of Independence: Independent         Comments: pt independent at baseline able to drive and care for himself w/ AE or ADs, he is active and enjoys riding his bicycle      Hand Dominance        Extremity/Trunk Assessment   Upper Extremity Assessment Upper Extremity Assessment: Overall WFL for tasks assessed    Lower Extremity Assessment Lower Extremity Assessment: RLE deficits/detail;LLE deficits/detail RLE Deficits / Details: strength appears WFLs for tasks, increased pain in feet w/ some numbness and tingling  LLE Deficits / Details: strength appears WFLs for tasks, increased pain in feet w/ some numbness and tingling        Communication   Communication: Other (comment) (somewhat difficult to understand secondary to history of oral cancer)  Cognition Arousal/Alertness: Awake/alert Behavior During Therapy: WFL for tasks assessed/performed Overall Cognitive Status: Within Functional Limits for tasks assessed                      General Comments      Exercises Other Exercises Other Exercises: gait training w/ RW, ambulated 200' w/ RW and min guarding to assess need for assistive device, displayed improved stability and gait speed w/ use of RW   Assessment/Plan    PT Assessment Patient needs continued PT services  PT Problem List Decreased balance;Decreased activity tolerance;Decreased mobility;Decreased knowledge of use of DME       PT Treatment Interventions DME instruction;Gait training;Stair training;Functional mobility training;Balance training;Therapeutic exercise;Therapeutic activities;Patient/family education    PT Goals (Current goals can be found in the Care Plan section)  Acute Rehab PT Goals Patient Stated Goal: To decrease foot pain and return home  PT Goal Formulation: With patient Time For Goal  Achievement: 10/01/16 Potential to Achieve Goals: Good    Frequency Min 2X/week   Barriers to discharge        Co-evaluation               End of Session Equipment Utilized During Treatment: Gait belt Activity Tolerance: Patient tolerated treatment well Patient left: in bed;with call bell/phone within reach Nurse Communication: Mobility status PT Visit Diagnosis: Unsteadiness on feet (R26.81);Difficulty in walking, not elsewhere classified (R26.2)         Time: 5784-6962 PT Time Calculation (min) (ACUTE ONLY): 22 min   Charges:         PT G Codes:         Syrena Burges Student PT 09/17/2016, 4:04 PM

## 2016-09-17 NOTE — Progress Notes (Signed)
[facility] NAME: Tregan Read    MR#:  [id number]  DATE OF BIRTH:  06/25/1951  SUBJECTIVE:  CHIEF COMPLAINT:   Chief Complaint  Patient presents with  . Shortness of Breath  . Weakness   Feels weak. Some cough Afebrile  REVIEW OF SYSTEMS:    Review of Systems  Constitutional: Positive for malaise/fatigue. Negative for chills and fever.  HENT: Negative for sore throat.   Eyes: Negative for blurred vision, double vision and pain.  Respiratory: Positive for cough. Negative for hemoptysis, shortness of breath and wheezing.   Cardiovascular: Negative for chest pain, palpitations, orthopnea and leg swelling.  Gastrointestinal: Negative for abdominal pain, constipation, diarrhea, heartburn, nausea and vomiting.  Genitourinary: Negative for dysuria and hematuria.  Musculoskeletal: Negative for back pain and joint pain.  Skin: Negative for rash.  Neurological: Positive for weakness. Negative for sensory change, speech change, focal weakness and headaches.  Endo/Heme/Allergies: Does not bruise/bleed easily.  Psychiatric/Behavioral: Negative for depression. The patient is not nervous/anxious.     DRUG ALLERGIES:  No Known Allergies  VITALS:  Blood pressure (!) 153/66, pulse (!) 42, temperature 97.3 F (36.3 C), temperature source Oral, resp. rate 20, height 5\' 7"  (1.702 m), weight 65.8 kg (145 lb), SpO2 97 %.  PHYSICAL EXAMINATION:   Physical Exam  GENERAL:  66 y.o.-year-old patient lying in the bed with no acute distress.  EYES: Pupils equal, round, reactive to light and accommodation. No scleral icterus. Extraocular muscles intact.  HEENT: Head atraumatic, normocephalic. Oropharynx and nasopharynx clear.Edentulous   NECK:  Supple, no jugular venous distention. No thyroid enlargement, no tenderness.  LUNGS: Normal breath sounds bilaterally, no wheezing, rales, rhonchi. No use of accessory muscles of respiration.   CARDIOVASCULAR: S1, S2 normal. No murmurs, rubs, or gallops.  ABDOMEN: Soft, nontender, nondistended. Bowel sounds present. No organomegaly or mass.  EXTREMITIES: No cyanosis, clubbing or edema b/l.    NEUROLOGIC: Cranial nerves II through XII are intact. No focal Motor or sensory deficits b/l.   PSYCHIATRIC: The patient is alert and oriented x 3.  SKIN: No obvious rash, lesion, or ulcer.   LABORATORY PANEL:   CBC  Recent Labs Lab 09/17/16 0539  WBC 10.5  HGB 12.9*  HCT 40.7  PLT 217   ------------------------------------------------------------------------------------------------------------------ Chemistries   Recent Labs Lab 09/16/16 1054 09/17/16 0539  NA 152* 153*  K 4.4 3.9  CL 117* 126*  CO2 23 25  GLUCOSE 553* 278*  BUN 65* 38*  CREATININE 1.53* 0.89  CALCIUM 8.7* 8.5*  AST 33  --   ALT 39  --   ALKPHOS 225*  --   BILITOT 1.4*  --    ------------------------------------------------------------------------------------------------------------------  Cardiac Enzymes No results for input(s): TROPONINI in the last 168 hours. ------------------------------------------------------------------------------------------------------------------  RADIOLOGY:  Dg Chest 2 View  Result Date: 09/16/2016 CLINICAL DATA:  Shortness of Breath EXAM: CHEST  2 VIEW COMPARISON:  Chest radiograph July 12, 2007 and chest CT October 08, 2015 FINDINGS: There is patchy airspace disease in the left lower lobe. Lungs elsewhere are clear. Heart size and pulmonary vascularity are normal. No adenopathy. No bone lesions. IMPRESSION: Left lower lobe patchy airspace disease consistent with pneumonia. Lungs elsewhere clear. No adenopathy evident. Followup PA and lateral chest radiographs recommended in 3-4 weeks following trial of antibiotic therapy to ensure resolution and exclude underlying malignancy. Electronically Signed   By: Lowella Grip III M.D.   On: 09/16/2016 11:38     ASSESSMENT  AND PLAN:   66 year old male with past medical history of diabetes, history of oral cancer, osteoarthritis who presented to the hospital due to weakness shortness of breath and noted to have a pneumonia.  * LLL Pneumonia On IV abx Cx pending  * Hypernatremia - due to dehydration and poor PO intake.  - Change 1/2 NS to D5  * Acute kidney injury-secondary to dehydration On IVF  * Diabetes type 2 without complication-patient's blood sugars are significantly uncontrolled. HbA1c is 10.6 Increase lantus to 20 units. SSI Blood sugars high here due to D5  * Hx of Oral Cancer OP f/u  All the records are reviewed and case discussed with Care Management/Social Workerr. Management plans discussed with the patient, family and they are in agreement.  CODE STATUS: FULL CODE  DVT Prophylaxis: SCDs  TOTAL TIME TAKING CARE OF THIS PATIENT: 35 minutes.   POSSIBLE D/C IN 1-2 DAYS, DEPENDING ON CLINICAL CONDITION.  Hillary Bow R M.D on 09/17/2016 at 1:27 PM  Between 7am to 6pm - Pager - [phone]  After 6pm go to [email or web address] - password [email or web address] [facility]  Office  [phone]  CC: Primary care physician; Marden Noble, MD  Note: This dictation was prepared with Dragon dictation along with smaller phrase technology. Any transcriptional errors that result from this process are unintentional.

## 2016-09-17 NOTE — Care Management Important Message (Signed)
Important Message  Patient Details  Name: Louis Stone MRN: [id number] Date of Birth: 1950/08/06   Medicare Important Message Given:  Yes    Beverly Sessions, RN 09/17/2016, 11:54 AM

## 2016-09-17 NOTE — Plan of Care (Signed)
Problem: Respiratory: Goal: Respiratory status will improve Outcome: Progressing Patient maintaining acceptable O2 saturations on RA

## 2016-09-18 LAB — CBC WITH DIFFERENTIAL/PLATELET
Basophils Absolute: 0 10*3/uL (ref 0–0.1)
Basophils Relative: 0 %
Eosinophils Absolute: 0.2 10*3/uL (ref 0–0.7)
Eosinophils Relative: 2 %
HCT: 35.9 % — ABNORMAL LOW (ref 40.0–52.0)
Hemoglobin: 11.7 g/dL — ABNORMAL LOW (ref 13.0–18.0)
Lymphocytes Relative: 10 %
Lymphs Abs: 0.8 10*3/uL — ABNORMAL LOW (ref 1.0–3.6)
MCH: 27.5 pg (ref 26.0–34.0)
MCHC: 32.6 g/dL (ref 32.0–36.0)
MCV: 84.3 fL (ref 80.0–100.0)
Monocytes Absolute: 0.3 10*3/uL (ref 0.2–1.0)
Monocytes Relative: 4 %
Neutro Abs: 6.5 10*3/uL (ref 1.4–6.5)
Neutrophils Relative %: 84 %
Platelets: 194 10*3/uL (ref 150–440)
RBC: 4.26 MIL/uL — ABNORMAL LOW (ref 4.40–5.90)
RDW: 15.7 % — ABNORMAL HIGH (ref 11.5–14.5)
WBC: 7.7 10*3/uL (ref 3.8–10.6)

## 2016-09-18 LAB — BASIC METABOLIC PANEL
Anion gap: 4 — ABNORMAL LOW (ref 5–15)
BUN: 26 mg/dL — ABNORMAL HIGH (ref 6–20)
CO2: 26 mmol/L (ref 22–32)
Calcium: 8 mg/dL — ABNORMAL LOW (ref 8.9–10.3)
Chloride: 116 mmol/L — ABNORMAL HIGH (ref 101–111)
Creatinine, Ser: 0.86 mg/dL (ref 0.61–1.24)
GFR calc Af Amer: >60 mL/min (ref >60)
GFR calc non Af Amer: >60 mL/min (ref >60)
Glucose, Bld: 249 mg/dL — ABNORMAL HIGH (ref 65–99)
Potassium: 4 mmol/L (ref 3.5–5.1)
Sodium: 146 mmol/L — ABNORMAL HIGH (ref 135–145)

## 2016-09-18 LAB — GLUCOSE, CAPILLARY
Glucose-Capillary: 215 mg/dL — ABNORMAL HIGH (ref 65–99)
Glucose-Capillary: 228 mg/dL — ABNORMAL HIGH (ref 65–99)

## 2016-09-18 LAB — HIV ANTIBODY (ROUTINE TESTING W REFLEX): HIV Screen 4th Generation wRfx: NONREACTIVE

## 2016-09-18 LAB — MAGNESIUM: Magnesium: 2.3 mg/dL (ref 1.7–2.4)

## 2016-09-18 MED ORDER — TOUJEO SOLOSTAR 300 UNIT/ML ~~LOC~~ SOPN: 18.0000 [IU] | PEN_INJECTOR | Freq: Every day | SUBCUTANEOUS | Status: DC

## 2016-09-18 MED ORDER — AZITHROMYCIN 250 MG PO TABS: 250.0000 mg | ORAL_TABLET | Freq: Every day | ORAL | 0 refills | Status: DC

## 2016-09-18 NOTE — Care Management Note (Addendum)
Case Management Note  Patient Details  Name: Louis Stone MRN: [id number] Date of Birth: [birth year]  Subjective/Objective:      Discussed discharge planning with [name]. He does not have a RW at home and one was ordered from [facility] to be delivered to [name]'s room today. A referral for HH-PT was called to Melene Muller at [facility].              Action/Plan:   Expected Discharge Date:  09/18/16               Expected Discharge Plan:    DC home In-House Referral:     Discharge planning Services   HH-PT  Post Acute Care Choice:   [facility] Choice offered to:   [name]  DME Arranged:   RW DME Agency:     [facility] HH Arranged:   HH-PT HH Agency:    [facility]   Status of Service:   In Progress.  If discussed at [place] of Stay Meetings, dates discussed:    Additional Comments:  Doyle Tegethoff A, RN 09/18/2016, 10:28 AM

## 2016-09-18 NOTE — Progress Notes (Signed)
Notified Md of spikes on tele monitor, pt VSS and no chest pain, labs ordered, continue to monitor

## 2016-09-18 NOTE — Care Management Note (Signed)
Case Management Note  Patient Details  Name: Louis Stone MRN: [id number] Date of Birth: 04/12/1951  Subjective/Objective:      Call to [facility]              Action/Plan:   Expected Discharge Date:  09/18/16               Expected Discharge Plan:     In-House Referral:     Discharge planning Services     Post Acute Care Choice:    Choice offered to:     DME Arranged:    DME Agency:     HH Arranged:    [place] Agency:     Status of Service:     If discussed at [employer] of Stay Meetings, dates discussed:    Additional Comments:  Tudor Chandley A, RN 09/18/2016, 10:23 AM

## 2016-09-18 NOTE — Discharge Instructions (Signed)
Resume diet and activity as before ° ° °

## 2016-09-18 NOTE — Progress Notes (Signed)
Patient discharged this shift; denies pain; discharged instructions as ordered; voices understanding; discharged via w/c by staff with family by side.

## 2016-09-20 NOTE — ED Notes (Signed)
Chart reviewed by this RN. Two sets of blood cultures were collected and sent to the lab before the administration of antibiotics.

## 2016-09-21 LAB — CULTURE, BLOOD (ROUTINE X 2)
Culture: NO GROWTH
Culture: NO GROWTH

## 2016-09-21 NOTE — Discharge Summary (Signed)
[place] [facility] [place] NAME: Louis Stone    MR#:  [id number]  DATE OF BIRTH:  11-07-1950  DATE OF ADMISSION:  09/16/2016 ADMITTING PHYSICIAN: Henreitta Leber, MD  DATE OF DISCHARGE: 09/18/2016  4:09 PM  PRIMARY CARE PHYSICIAN: Marden Noble, MD   ADMISSION DIAGNOSIS:  Dehydration [E86.0] Hyperglycemia [R73.9] Generalized weakness [R53.1] Sepsis, due to unspecified organism ([place]) [A41.9] Community acquired pneumonia of left lower lobe of lung ([place]) [J18.1]  DISCHARGE DIAGNOSIS:  Active Problems:   Sepsis ([place])   SECONDARY DIAGNOSIS:   Past Medical History:  Diagnosis Date  . Arthritis    per pt. I do not have md notes that indicate dx.  . Diabetes mellitus without complication ([place])   . 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 Personal history of tobacco use, presenting hazards to health 10/08/2015     ADMITTING HISTORY  HISTORY OF PRESENT ILLNESS:  Louis Stone  is a 66 y.o. male with a known history of Osteoarthritis, diabetes, history of oral cancer who presents to the hospital due to weakness, cough and shortness of breath and noted to have pneumonia. Patient is difficult to understand due to his history of oral cancer. Patient says that he has been having shortness of breath on exertion over the past few days, feeling weak, admits to a cough which is nonproductive but no fever. He presented to the emergency room and noted to have chest x-ray findings suggestive of a left lower lobe pneumonia. Patient was also noted to be hypoglycemic, hypernatremic. Hospitalist services were contacted further treatment and evaluation. Patient denies any chest pain, abdominal pain, nausea, vomiting, diaphoresis, palpitations, syncope, hematuria, weight loss. He does admit to poor by mouth intake.   HOSPITAL COURSE:   65 year old male with past medical history of diabetes, history of oral cancer,  osteoarthritis who presented to the hospital due to weakness shortness of breath and noted to have a pneumonia.  * LLL Pneumonia On IV abx in the hospital. Afebrile with normal WBC by day of discharge. Change to azithromycin.  * Hypernatremia - due to dehydration and poor PO intake.  - Initially started on half-normal saline and later switched to D5. With this his sodium trended down to normal range. Consult to increase his fluid intake.  * Acute kidney injury-secondary to dehydration On IVF. And returned to normal  * Diabetes type 2 without complication-patient's blood sugars are significantly uncontrolled. HbA1c is 10.6 Blood sugars were uncontrolled in the hospital as patient was on D5. At discharge his insulin is being increased from 10 units to 15 units. Continue sliding scale.  * Hx of Oral Cancer OP f/u Did not have any trouble swallowing during the hospital stay. His problem was more with chewing due to not having his dentures.  Stable for discharge home.  CONSULTS OBTAINED:    DRUG ALLERGIES:  No Known Allergies  DISCHARGE MEDICATIONS:   Discharge Medication List as of 09/18/2016  9:26 AM    START taking these medications   Details  azithromycin (ZITHROMAX) 250 MG tablet Take 1 tablet (250 mg total) by mouth daily., Starting Sat 09/18/2016, Print      CONTINUE these medications which have CHANGED   Details  TOUJEO SOLOSTAR 300 UNIT/ML SOPN Inject 18 Units into the skin daily., Starting Sat 09/18/2016, No Print      CONTINUE these medications which have NOT CHANGED   Details  albuterol (  PROVENTIL HFA;VENTOLIN HFA) 108 (90 Base) MCG/ACT inhaler Inhale 2 puffs into the lungs., Historical Med    benzonatate (TESSALON) 200 MG capsule Take 200 mg by mouth 3 (three) times daily as needed for cough., Historical Med      STOP taking these medications     doxycycline (VIBRA-TABS) 100 MG tablet         Today   VITAL SIGNS:  Blood pressure 108/62, pulse 61,  temperature 98.2 F (36.8 C), temperature source Oral, resp. rate 20, height 5\' 7"  (1.702 m), weight 65.8 kg (145 lb), SpO2 97 %.  I/O:  No intake or output data in the 24 hours ending 09/21/16 1043  PHYSICAL EXAMINATION:  Physical Exam  GENERAL:  66 y.o.-year-old patient lying in the bed with no acute distress.  LUNGS: Normal breath sounds bilaterally, no wheezing, rales,rhonchi or crepitation. No use of accessory muscles of respiration.  CARDIOVASCULAR: S1, S2 normal. No murmurs, rubs, or gallops.  ABDOMEN: Soft, non-tender, non-distended. Bowel sounds present. No organomegaly or mass.  NEUROLOGIC: Moves all 4 extremities. PSYCHIATRIC: The patient is alert and oriented x 3.  SKIN: No obvious rash, lesion, or ulcer.   DATA REVIEW:   CBC  Recent Labs Lab 09/18/16 0346  WBC 7.7  HGB 11.7*  HCT 35.9*  PLT 194    Chemistries   Recent Labs Lab 09/16/16 1054  09/18/16 0346  NA 152*  < > 146*  K 4.4  < > 4.0  CL 117*  < > 116*  CO2 23  < > 26  GLUCOSE 553*  < > 249*  BUN 65*  < > 26*  CREATININE 1.53*  < > 0.86  CALCIUM 8.7*  < > 8.0*  MG  --   --  2.3  AST 33  --   --   ALT 39  --   --   ALKPHOS 225*  --   --   BILITOT 1.4*  --   --   < > = values in this interval not displayed.  Cardiac Enzymes No results for input(s): TROPONINI in the last 168 hours.  Microbiology Results  Results for orders placed or performed during the hospital encounter of 09/16/16  Blood Culture (routine x 2)     Status: None   Collection Time: 09/16/16  4:15 PM  Result Value Ref Range Status   Specimen Description BLOOD R AC  Final   Special Requests BOTTLES DRAWN AEROBIC AND ANAEROBIC BCAV  Final   Culture NO GROWTH 5 DAYS  Final   Report Status 09/21/2016 FINAL  Final  Blood Culture (routine x 2)     Status: None   Collection Time: 09/16/16  4:15 PM  Result Value Ref Range Status   Specimen Description BLOOD  L AC  Final   Special Requests BOTTLES DRAWN AEROBIC AND ANAEROBIC  BCAV  Final   Culture NO GROWTH 5 DAYS  Final   Report Status 09/21/2016 FINAL  Final    RADIOLOGY:  No results found.  Follow up with PCP in 1 week.  Management plans discussed with the patient, family and they are in agreement.  CODE STATUS:  Code Status History    Date Active Date Inactive Code Status Order ID Comments User Context   09/16/2016  7:43 PM 09/18/2016  7:14 PM Full Code [id number]  Henreitta Leber, MD Inpatient      TOTAL TIME TAKING CARE OF THIS PATIENT ON DAY OF DISCHARGE: more than 30 minutes.   Louis Stone,  Leia Alf M.D on 09/21/2016 at 10:43 AM  Between 7am to 6pm - Pager - [phone]  After 6pm go to [email or web address] - password [email or web address] [facility]  Office  [phone]  CC: Primary care physician; Marden Noble, MD  Note: This dictation was prepared with Dragon dictation along with smaller phrase technology. Any transcriptional errors that result from this process are unintentional.

## 2016-09-29 ENCOUNTER — Telehealth: Payer: Self-pay | Admitting: *Deleted

## 2016-09-29 DIAGNOSIS — Z87891 Personal history of nicotine dependence: Secondary | ICD-10-CM

## 2016-09-29 NOTE — Telephone Encounter (Signed)
Notified patient that annual lung cancer screening low dose CT scan is due. Confirmed that patient is within the age range of 55-77, and asymptomatic, (no signs or symptoms of lung cancer). Patient denies illness that would prevent curative treatment for lung cancer if found. The patient is a former smoker quit 2008, with a 60 pack year history. The shared decision making visit was done 10/08/15. Patient is agreeable for CT scan being scheduled at end of month.

## 2016-11-01 ENCOUNTER — Ambulatory Visit: Admission: RE | Admit: 2016-11-01 | Payer: Medicare HMO | Source: Ambulatory Visit

## 2016-11-10 ENCOUNTER — Telehealth: Payer: Self-pay | Admitting: *Deleted

## 2016-11-10 NOTE — Telephone Encounter (Signed)
Notified patient that annual lung cancer screening low dose CT scan is due and assessed desire for lung screening scan due to no show at last appt. Offered rescheduling. Patient refuses lung screening scan at this time.

## 2017-09-05 ENCOUNTER — Encounter: Payer: Self-pay | Admitting: Emergency Medicine

## 2017-09-05 ENCOUNTER — Emergency Department
Admission: EM | Admit: 2017-09-05 | Discharge: 2017-09-05 | Disposition: A | Discharge status: Home / Self Care | Payer: Medicare HMO | Attending: Emergency Medicine | Admitting: Emergency Medicine

## 2017-09-05 ENCOUNTER — Other Ambulatory Visit: Payer: Self-pay

## 2017-09-05 DIAGNOSIS — R066 Hiccough: Secondary | ICD-10-CM | POA: Insufficient documentation

## 2017-09-05 DIAGNOSIS — K2970 Gastritis, unspecified, without bleeding: Secondary | ICD-10-CM

## 2017-09-05 DIAGNOSIS — Z87891 Personal history of nicotine dependence: Secondary | ICD-10-CM | POA: Diagnosis not present

## 2017-09-05 DIAGNOSIS — Z79899 Other long term (current) drug therapy: Secondary | ICD-10-CM | POA: Diagnosis not present

## 2017-09-05 DIAGNOSIS — E119 Type 2 diabetes mellitus without complications: Secondary | ICD-10-CM | POA: Insufficient documentation

## 2017-09-05 DIAGNOSIS — K2900 Acute gastritis without bleeding: Secondary | ICD-10-CM | POA: Insufficient documentation

## 2017-09-05 DIAGNOSIS — R109 Unspecified abdominal pain: Secondary | ICD-10-CM

## 2017-09-05 DIAGNOSIS — R1010 Upper abdominal pain, unspecified: Secondary | ICD-10-CM | POA: Diagnosis present

## 2017-09-05 LAB — CBC
HCT: 31.7 % — ABNORMAL LOW (ref 40.0–52.0)
Hemoglobin: 10.5 g/dL — ABNORMAL LOW (ref 13.0–18.0)
MCH: 26.5 pg (ref 26.0–34.0)
MCHC: 33 g/dL (ref 32.0–36.0)
MCV: 80.4 fL (ref 80.0–100.0)
Platelets: 347 10*3/uL (ref 150–440)
RBC: 3.95 MIL/uL — ABNORMAL LOW (ref 4.40–5.90)
RDW: 14.8 % — ABNORMAL HIGH (ref 11.5–14.5)
WBC: 6.1 10*3/uL (ref 3.8–10.6)

## 2017-09-05 LAB — COMPREHENSIVE METABOLIC PANEL
ALT: 30 U/L (ref 17–63)
AST: 28 U/L (ref 15–41)
Albumin: 2.4 g/dL — ABNORMAL LOW (ref 3.5–5.0)
Alkaline Phosphatase: 190 U/L — ABNORMAL HIGH (ref 38–126)
Anion gap: 9 (ref 5–15)
BUN: 12 mg/dL (ref 6–20)
CO2: 28 mmol/L (ref 22–32)
Calcium: 8 mg/dL — ABNORMAL LOW (ref 8.9–10.3)
Chloride: 96 mmol/L — ABNORMAL LOW (ref 101–111)
Creatinine, Ser: 0.86 mg/dL (ref 0.61–1.24)
GFR calc Af Amer: >60 mL/min (ref >60)
GFR calc non Af Amer: >60 mL/min (ref >60)
Glucose, Bld: 176 mg/dL — ABNORMAL HIGH (ref 65–99)
Potassium: 3 mmol/L — ABNORMAL LOW (ref 3.5–5.1)
Sodium: 133 mmol/L — ABNORMAL LOW (ref 135–145)
Total Bilirubin: 1.2 mg/dL (ref 0.3–1.2)
Total Protein: 7.5 g/dL (ref 6.5–8.1)

## 2017-09-05 LAB — URINALYSIS, COMPLETE (UACMP) WITH MICROSCOPIC
Bacteria, UA: NONE SEEN
Bilirubin Urine: NEGATIVE
Glucose, UA: >=500 mg/dL — AB
Ketones, ur: NEGATIVE mg/dL
Leukocytes, UA: NEGATIVE
Nitrite: NEGATIVE
Protein, ur: 30 mg/dL — AB
Specific Gravity, Urine: 1.027 (ref 1.005–1.030)
pH: 5 (ref 5.0–8.0)

## 2017-09-05 LAB — TROPONIN I: Troponin I: <0.03 ng/mL (ref <0.03)

## 2017-09-05 LAB — LIPASE, BLOOD: Lipase: 27 U/L (ref 11–51)

## 2017-09-05 MED ORDER — GI COCKTAIL ~~LOC~~
30.0000 mL | Freq: Once | ORAL | Status: AC
  Administered 2017-09-05: 30 mL via ORAL
  Filled 2017-09-05: qty 30

## 2017-09-05 MED ORDER — ONDANSETRON HCL 4 MG/2ML IJ SOLN: 4.0000 mg | Freq: Once | INTRAMUSCULAR | Status: DC

## 2017-09-05 MED ORDER — SUCRALFATE 1 G PO TABS: 1.0000 g | ORAL_TABLET | Freq: Four times a day (QID) | ORAL | 0 refills | Status: DC

## 2017-09-05 MED ORDER — SODIUM CHLORIDE 0.9 % IV BOLUS (SEPSIS): 1000.0000 mL | Freq: Once | INTRAVENOUS | Status: DC

## 2017-09-05 MED ORDER — POTASSIUM CHLORIDE CRYS ER 20 MEQ PO TBCR
40.0000 meq | EXTENDED_RELEASE_TABLET | Freq: Once | ORAL | Status: AC
  Administered 2017-09-05: 40 meq via ORAL
  Filled 2017-09-05: qty 2

## 2017-09-05 MED ORDER — FAMOTIDINE 40 MG PO TABS: 40.0000 mg | ORAL_TABLET | Freq: Every evening | ORAL | 1 refills | Status: DC

## 2017-09-05 MED ORDER — MORPHINE SULFATE (PF) 4 MG/ML IV SOLN: 4.0000 mg | Freq: Once | INTRAVENOUS | Status: DC

## 2017-09-05 NOTE — ED Provider Notes (Addendum)
[facility] Emergency Department Provider Note  ____________________________________________   I have reviewed the triage vital signs and the nursing notes.   HISTORY  Chief Complaint Abdominal Pain   History limited by: Not Limited   HPI Louis Stone is a 67 y.o. male who presents to the emergency department today with concerns for abdominal pain and hiccups.  The pain is been present for the past 2-3 weeks.  Located primarily in the upper abdomen.  Has been fairly constant.  Describes it as somewhat sharp.  This has been accompanied by frequent hiccupping. States that he went to his primary care doctor's office today for routine checkup and they wanted him to be evaluated in the emergency department.  The patient is not sure what their concerns were.  He denies any fevers.  Denies any vomiting.  No bloody or black or tarry stool.   Per medical record review patient has a history of DM.  Past Medical History:  Diagnosis Date  . Arthritis    per pt. I do not have md notes that indicate dx.  . Diabetes mellitus without complication ([place])   . 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 Personal history of tobacco use, presenting hazards to health 10/08/2015    Patient Active Problem List   Diagnosis Date Noted  . Sepsis ([place]) 09/16/2016  . Personal history of tobacco use, presenting hazards to health 10/08/2015    Past Surgical History:  Procedure Laterality Date  . [place]   pt states ruptured disc and had surgery to repair it    Prior to Admission medications   Medication Sig Start Date End Date Taking? Authorizing Provider  albuterol (PROVENTIL HFA;VENTOLIN HFA) 108 (90 Base) MCG/ACT inhaler Inhale 2 puffs into the lungs.    [provider]  azithromycin (ZITHROMAX) 250 MG tablet Take 1 tablet (250 mg total) by mouth daily. 09/18/16   Hillary Bow, MD  benzonatate (TESSALON) 200 MG  capsule Take 200 mg by mouth 3 (three) times daily as needed for cough.    [provider]  TOUJEO SOLOSTAR 300 UNIT/ML SOPN Inject 18 Units into the skin daily. 09/18/16   Hillary Bow, MD    Allergies Patient has no known allergies.  Family History  Problem Relation Age of Onset  . CVA Father   . Prostate cancer Neg Hx        not sure what family member    Social History Social History   Tobacco Use  . Smoking status: Former Smoker    Packs/day: 1.50    Years: 40.00    Pack years: 60.00    Last attempt to quit: 07/05/2006    Years since quitting: 11.1  . Smokeless tobacco: Never Used  Substance Use Topics  . Alcohol use: No    Comment: quit in 1991  . Drug use: No    Review of Systems Constitutional: No fever/chills Eyes: No visual changes. ENT: No sore throat. Cardiovascular: Denies chest pain. Respiratory: Denies shortness of breath. Gastrointestinal: Positive for abdominal pain. Genitourinary: Negative for dysuria. Musculoskeletal: Negative for back pain. Skin: Negative for rash. Neurological: Negative for headaches, focal weakness or numbness.  ____________________________________________   PHYSICAL EXAM:  VITAL SIGNS: ED Triage Vitals  Enc Vitals Group     BP 09/05/17 1841 119/84     Pulse Rate 09/05/17 1840 86     Resp 09/05/17 1840 16  Temp 09/05/17 1840 98.5 F (36.9 C)     Temp Source 09/05/17 1840 Oral     SpO2 09/05/17 1840 98 %     Weight 09/05/17 1840 146 lb (66.2 kg)     Height 09/05/17 1840 5\' 7"  (1.702 m)     Head Circumference --      Peak Flow --      Pain Score 09/05/17 1839 8   Constitutional: Alert and oriented. Well appearing and in no distress. Frequent hiccupping.  Eyes: Conjunctivae are normal.  ENT   Head: Normocephalic and atraumatic.   Nose: No congestion/rhinnorhea.   Mouth/Throat: Mucous membranes are moist.   Neck: No stridor. Hematological/Lymphatic/Immunilogical: No cervical  lymphadenopathy. Cardiovascular: Normal rate, regular rhythm.  No murmurs, rubs, or gallops.  Respiratory: Normal respiratory effort without tachypnea nor retractions. Breath sounds are clear and equal bilaterally. No wheezes/rales/rhonchi. Gastrointestinal: Soft and non tender. No rebound. No guarding.  Genitourinary: Deferred Musculoskeletal: Normal range of motion in all extremities. No lower extremity edema. Neurologic:  Normal speech and language. No gross focal neurologic deficits are appreciated.  Skin:  Skin is warm, dry and intact. No rash noted. Psychiatric: Mood and affect are normal. Speech and behavior are normal. Patient exhibits appropriate insight and judgment.  ____________________________________________    LABS (pertinent positives/negatives)  Lipase 27 Trop <0.03 CBC wbc 6.1, hgb 10.5, plt 347 CMP na 133, k 3.0, glu 176, cr 0.86 UA rbc too numerous to count, wbc 6.-30 wbc, glu >500 ____________________________________________   EKG  I, Nance Pear, attending physician, personally viewed and interpreted this EKG  EKG Time: 1846 Rate: 85 Rhythm: sinus rhythm with pac Axis: normal Intervals: qtc 452 QRS: narrow, q waves v1 ST changes: no st elevation Impression: abnormal ekg   ____________________________________________    RADIOLOGY  None  ____________________________________________   PROCEDURES  Procedures  ____________________________________________   INITIAL IMPRESSION / ASSESSMENT AND PLAN / ED COURSE  Pertinent labs & imaging results that were available during my care of the patient were reviewed by me and considered in my medical decision making (see chart for details).  Patient presented to the emergency department today at the request of primary care physician because of concerns for abdominal pain.  Patient has also had frequent hiccuping with this pain.  Blood work without any concerning leukocytosis.  Abdomen is benign.   This point unclear etiology of the patient's symptoms although he did feel some relief with GI cocktail.  Will treat for gastritis.  Discussed with patient importance of primary care follow-up.   ____________________________________________   FINAL CLINICAL IMPRESSION(S) / ED DIAGNOSES  Final diagnoses:  Hiccups  Abdominal pain, unspecified abdominal location  Gastritis, presence of bleeding unspecified, unspecified chronicity, unspecified gastritis type     Note: This dictation was prepared with Dragon dictation. Any transcriptional errors that result from this process are unintentional     Nance Pear, MD 09/06/17 1540    Nance Pear, MD 09/19/17 1536

## 2017-09-05 NOTE — ED Notes (Signed)
Pt discharged to home.  Discharge instructions reviewed.  Verbalized understanding.  No questions or concerns at this time.  Teach back verified.  Pt in NAD.  No items left in ED.   

## 2017-09-05 NOTE — Discharge Instructions (Signed)
Please seek medical attention for any high fevers, chest pain, shortness of breath, change in behavior, persistent vomiting, bloody stool or any other new or concerning symptoms.  

## 2017-09-05 NOTE — ED Triage Notes (Signed)
Here for upper abdominal pain X 2-3 weeks that pt reports is constant.  No worse with food per pt. Denies NVD.  Denies fevers.  Denies urinary sx.  Ambulatory. VSS.

## 2020-02-08 ENCOUNTER — Encounter: Payer: Self-pay | Admitting: Emergency Medicine

## 2020-02-08 ENCOUNTER — Other Ambulatory Visit: Payer: Self-pay

## 2020-02-08 DIAGNOSIS — E119 Type 2 diabetes mellitus without complications: Secondary | ICD-10-CM | POA: Diagnosis not present

## 2020-02-08 DIAGNOSIS — R112 Nausea with vomiting, unspecified: Secondary | ICD-10-CM | POA: Diagnosis not present

## 2020-02-08 DIAGNOSIS — R109 Unspecified abdominal pain: Secondary | ICD-10-CM | POA: Diagnosis not present

## 2020-02-08 DIAGNOSIS — Z20822 Contact with and (suspected) exposure to covid-19: Secondary | ICD-10-CM | POA: Insufficient documentation

## 2020-02-08 DIAGNOSIS — J181 Lobar pneumonia, unspecified organism: Secondary | ICD-10-CM | POA: Insufficient documentation

## 2020-02-08 DIAGNOSIS — Z87891 Personal history of nicotine dependence: Secondary | ICD-10-CM | POA: Insufficient documentation

## 2020-02-08 DIAGNOSIS — R05 Cough: Secondary | ICD-10-CM | POA: Diagnosis present

## 2020-02-08 LAB — CBC
HCT: 36 % — ABNORMAL LOW (ref 39.0–52.0)
Hemoglobin: 11.8 g/dL — ABNORMAL LOW (ref 13.0–17.0)
MCH: 26.6 pg (ref 26.0–34.0)
MCHC: 32.8 g/dL (ref 30.0–36.0)
MCV: 81.1 fL (ref 80.0–100.0)
Platelets: 178 10*3/uL (ref 150–400)
RBC: 4.44 MIL/uL (ref 4.22–5.81)
RDW: 14 % (ref 11.5–15.5)
WBC: 9.6 10*3/uL (ref 4.0–10.5)
nRBC: 0 % (ref 0.0–0.2)

## 2020-02-08 LAB — COMPREHENSIVE METABOLIC PANEL
ALT: 14 U/L (ref 0–44)
AST: 28 U/L (ref 15–41)
Albumin: 3.4 g/dL — ABNORMAL LOW (ref 3.5–5.0)
Alkaline Phosphatase: 74 U/L (ref 38–126)
Anion gap: 7 (ref 5–15)
BUN: 18 mg/dL (ref 8–23)
CO2: 25 mmol/L (ref 22–32)
Calcium: 8.4 mg/dL — ABNORMAL LOW (ref 8.9–10.3)
Chloride: 103 mmol/L (ref 98–111)
Creatinine, Ser: 1.01 mg/dL (ref 0.61–1.24)
GFR calc Af Amer: >60 mL/min (ref >60)
GFR calc non Af Amer: >60 mL/min (ref >60)
Glucose, Bld: 254 mg/dL — ABNORMAL HIGH (ref 70–99)
Potassium: 4.1 mmol/L (ref 3.5–5.1)
Sodium: 135 mmol/L (ref 135–145)
Total Bilirubin: 1 mg/dL (ref 0.3–1.2)
Total Protein: 7 g/dL (ref 6.5–8.1)

## 2020-02-08 LAB — URINALYSIS, COMPLETE (UACMP) WITH MICROSCOPIC
Bacteria, UA: NONE SEEN
Bilirubin Urine: NEGATIVE
Glucose, UA: >=500 mg/dL — AB
Hgb urine dipstick: NEGATIVE
Ketones, ur: NEGATIVE mg/dL
Leukocytes,Ua: NEGATIVE
Nitrite: NEGATIVE
Protein, ur: NEGATIVE mg/dL
Specific Gravity, Urine: 1.023 (ref 1.005–1.030)
Squamous Epithelial / LPF: NONE SEEN (ref 0–5)
pH: 5 (ref 5.0–8.0)

## 2020-02-08 LAB — LIPASE, BLOOD: Lipase: 30 U/L (ref 11–51)

## 2020-02-08 NOTE — ED Triage Notes (Signed)
First RN note: [facility] with c/o emesis. Per EMS pt had approx 2 episodes of emesis PTA. Per EMS CBG >300. Pt given approx 400cc's NS bolus PTA.   325 systolic

## 2020-02-08 NOTE — ED Triage Notes (Signed)
Here for vomiting that started two hours ago. Some lower abdominal pain.  VSS, NAD. Unlabored.  Denies diarrhea.

## 2020-02-09 ENCOUNTER — Encounter: Payer: Self-pay | Admitting: Radiology

## 2020-02-09 ENCOUNTER — Emergency Department: Payer: Medicare Other

## 2020-02-09 ENCOUNTER — Emergency Department
Admission: EM | Admit: 2020-02-09 | Discharge: 2020-02-09 | Disposition: A | Discharge status: Home / Self Care | Payer: Medicare Other | Attending: Emergency Medicine | Admitting: Emergency Medicine

## 2020-02-09 DIAGNOSIS — J189 Pneumonia, unspecified organism: Secondary | ICD-10-CM

## 2020-02-09 DIAGNOSIS — R112 Nausea with vomiting, unspecified: Secondary | ICD-10-CM

## 2020-02-09 LAB — SARS CORONAVIRUS 2 BY RT PCR (HOSPITAL ORDER, PERFORMED IN ~~LOC~~ HOSPITAL LAB): SARS Coronavirus 2: NEGATIVE

## 2020-02-09 LAB — LACTIC ACID, PLASMA: Lactic Acid, Venous: 1.2 mmol/L (ref 0.5–1.9)

## 2020-02-09 MED ORDER — AZITHROMYCIN 500 MG PO TABS
500.0000 mg | ORAL_TABLET | Freq: Once | ORAL | Status: AC
  Administered 2020-02-09: 500 mg via ORAL
  Filled 2020-02-09: qty 1

## 2020-02-09 MED ORDER — SODIUM CHLORIDE 0.9 % IV BOLUS: 1000.0000 mL | Freq: Once | INTRAVENOUS | Status: DC

## 2020-02-09 MED ORDER — SODIUM CHLORIDE 0.9 % IV SOLN
2.0000 g | Freq: Once | INTRAVENOUS | Status: AC
  Administered 2020-02-09: 2 g via INTRAVENOUS
  Filled 2020-02-09: qty 20

## 2020-02-09 MED ORDER — MORPHINE SULFATE (PF) 4 MG/ML IV SOLN
4.0000 mg | Freq: Once | INTRAVENOUS | Status: AC
  Administered 2020-02-09: 4 mg via INTRAVENOUS
  Filled 2020-02-09: qty 1

## 2020-02-09 MED ORDER — PANTOPRAZOLE SODIUM 40 MG PO TBEC
40.0000 mg | DELAYED_RELEASE_TABLET | Freq: Every day | ORAL | 0 refills | Status: DC
Start: 2020-02-09 — End: 2020-07-17

## 2020-02-09 MED ORDER — ONDANSETRON HCL 4 MG/2ML IJ SOLN
4.0000 mg | Freq: Once | INTRAMUSCULAR | Status: AC
  Administered 2020-02-09: 4 mg via INTRAVENOUS
  Filled 2020-02-09: qty 2

## 2020-02-09 MED ORDER — IOHEXOL 300 MG/ML  SOLN
100.0000 mL | Freq: Once | INTRAMUSCULAR | Status: AC | PRN
  Administered 2020-02-09: 100 mL via INTRAVENOUS

## 2020-02-09 MED ORDER — SODIUM CHLORIDE 0.9 % IV SOLN
500.0000 mg | Freq: Once | INTRAVENOUS | Status: DC
  Filled 2020-02-09: qty 500

## 2020-02-09 MED ORDER — ONDANSETRON HCL 4 MG PO TABS: 4.0000 mg | ORAL_TABLET | Freq: Every day | ORAL | 0 refills | Status: AC | PRN

## 2020-02-09 MED ORDER — LEVOFLOXACIN 750 MG PO TABS
750.0000 mg | ORAL_TABLET | Freq: Every day | ORAL | 0 refills | Status: AC
Start: 2020-02-09 — End: 2020-02-14

## 2020-02-09 MED ORDER — SODIUM CHLORIDE 0.9 % IV BOLUS
1000.0000 mL | Freq: Once | INTRAVENOUS | Status: AC
  Administered 2020-02-09: 1000 mL via INTRAVENOUS

## 2020-02-09 NOTE — ED Provider Notes (Signed)
[facility] Emergency Department Provider Note  ____________________________________________   First MD Initiated Contact with Patient 02/09/20 [phone]     (approximate)  I have reviewed the triage vital signs and the nursing notes.   HISTORY  Chief Complaint Emesis    HPI Louis Stone is a 69 y.o. male  Here with fatigue, nausea/vomiting, cough. Pt has a h/o DM, oropharyngeal CA, prior smoking. He reports that over the past day, he has felt fatigued. He began coughing more than usual today but has not felt SOB, and has not produced any sputum. Earlier this afternoon, he began to feel more weak and nauseous, and vomited NBNB emesis several times. He subsequently presents for evaluation. Denies any fever, chills. No CP, SOB. His cough is chronic but slightly worse. No known sick contacts. No suspicious food intakes. Has some mild epigastric abd discomfort. No overt CP.        Past Medical History:  Diagnosis Date  . Arthritis    per pt. I do not have md notes that indicate dx.  . Diabetes mellitus without complication ([place])   . 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 Personal history of tobacco use, presenting hazards to health 10/08/2015    Patient Active Problem List   Diagnosis Date Noted  . Sepsis ([place]) 09/16/2016  . Personal history of tobacco use, presenting hazards to health 10/08/2015    Past Surgical History:  Procedure Laterality Date  . [place]   pt states ruptured disc and had surgery to repair it    Prior to Admission medications   Medication Sig Start Date End Date Taking? Authorizing Provider  albuterol (PROVENTIL HFA;VENTOLIN HFA) 108 (90 Base) MCG/ACT inhaler Inhale 2 puffs into the lungs.    [provider]  azithromycin (ZITHROMAX) 250 MG tablet Take 1 tablet (250 mg total) by mouth daily. 09/18/16   Hillary Bow, MD  benzonatate (TESSALON) 200 MG capsule Take  200 mg by mouth 3 (three) times daily as needed for cough.    [provider]  famotidine (PEPCID) 40 MG tablet Take 1 tablet (40 mg total) by mouth every evening. 09/05/17 09/05/18  Nance Pear, MD  levofloxacin (LEVAQUIN) 750 MG tablet Take 1 tablet (750 mg total) by mouth daily for 5 days. 02/09/20 02/14/20  Duffy Bruce, MD  ondansetron (ZOFRAN) 4 MG tablet Take 1 tablet (4 mg total) by mouth daily as needed for nausea or vomiting. 02/09/20 02/08/21  Duffy Bruce, MD  pantoprazole (PROTONIX) 40 MG tablet Take 1 tablet (40 mg total) by mouth daily for 14 days. 02/09/20 02/23/20  Duffy Bruce, MD  sucralfate (CARAFATE) 1 g tablet Take 1 tablet (1 g total) by mouth 4 (four) times daily. 09/05/17   Nance Pear, MD  TOUJEO SOLOSTAR 300 UNIT/ML SOPN Inject 18 Units into the skin daily. 09/18/16   Hillary Bow, MD    Allergies Patient has no known allergies.  Family History  Problem Relation Age of Onset  . CVA Father   . Prostate cancer Neg Hx        not sure what family member    Social History Social History   Tobacco Use  . Smoking status: Former Smoker    Packs/day: 1.50    Years: 40.00    Pack years: 60.00    Quit date: 07/05/2006    Years since quitting: 13.6  . Smokeless tobacco: Never Used  Substance Use  Topics  . Alcohol use: No    Comment: quit in 1991  . Drug use: No    Review of Systems  Review of Systems  Constitutional: Positive for fatigue. Negative for chills and fever.  HENT: Negative for sore throat.   Respiratory: Positive for cough. Negative for shortness of breath.   Cardiovascular: Negative for chest pain.  Gastrointestinal: Positive for abdominal pain, nausea and vomiting.  Genitourinary: Negative for flank pain.  Musculoskeletal: Negative for neck pain.  Skin: Negative for rash and wound.  Allergic/Immunologic: Negative for immunocompromised state.  Neurological: Positive for weakness. Negative for numbness.  Hematological: Does not  bruise/bleed easily.  All other systems reviewed and are negative.    ____________________________________________  PHYSICAL EXAM:      VITAL SIGNS: ED Triage Vitals  Enc Vitals Group     BP 02/08/20 1302 (!) 150/81     Pulse Rate 02/08/20 1302 89     Resp 02/08/20 1302 18     Temp 02/08/20 1302 99.8 F (37.7 C)     Temp Source 02/08/20 1302 Oral     SpO2 02/08/20 1302 99 %     Weight 02/08/20 1303 155 lb (70.3 kg)     Height 02/08/20 1303 5\' 7"  (1.702 m)     Head Circumference --      Peak Flow --      Pain Score 02/08/20 1302 4     Pain Loc --      Pain Edu? --      Excl. in [place]? --      Physical Exam Vitals and nursing note reviewed.  Constitutional:      General: He is not in acute distress.    Appearance: He is well-developed.  HENT:     Head: Normocephalic and atraumatic.  Eyes:     Conjunctiva/sclera: Conjunctivae normal.  Cardiovascular:     Rate and Rhythm: Normal rate and regular rhythm.     Heart sounds: Normal heart sounds. No murmur heard.  No friction rub.  Pulmonary:     Effort: Pulmonary effort is normal. No respiratory distress.     Breath sounds: Rhonchi present. No wheezing or rales.  Abdominal:     General: There is no distension.     Palpations: Abdomen is soft.     Tenderness: There is abdominal tenderness (mild, epigastric).  Musculoskeletal:     Cervical back: Neck supple.  Skin:    General: Skin is warm.     Capillary Refill: Capillary refill takes less than 2 seconds.  Neurological:     Mental Status: He is alert and oriented to person, place, and time.     Motor: No abnormal muscle tone.       ____________________________________________   LABS (all labs ordered are listed, but only abnormal results are displayed)  Labs Reviewed  COMPREHENSIVE METABOLIC PANEL - Abnormal; Notable for the following components:      Result Value   Glucose, Bld 254 (*)    Calcium 8.4 (*)    Albumin 3.4 (*)    All other components within  normal limits  CBC - Abnormal; Notable for the following components:   Hemoglobin 11.8 (*)    HCT 36.0 (*)    All other components within normal limits  URINALYSIS, COMPLETE (UACMP) WITH MICROSCOPIC - Abnormal; Notable for the following components:   Color, Urine YELLOW (*)    APPearance HAZY (*)    Glucose, UA >=500 (*)    All other components  within normal limits  SARS CORONAVIRUS 2 BY RT PCR (HOSPITAL ORDER, [place] [facility])  LIPASE, BLOOD  LACTIC ACID, PLASMA  LACTIC ACID, PLASMA    ____________________________________________  EKG: Normal sinus rhythm, VR 70. QRS 81, QTc 444. No ST elevations or depressions. ________________________________________  RADIOLOGY All imaging, including plain films, CT scans, and ultrasounds, independently reviewed by me, and interpretations confirmed via formal radiology reads.  ED MD interpretation:   CXR: Left basilar infiltrate CT A/P: Airspace disease in lung bases, hiatal hernia, no bowel obstruction  Official radiology report(s): DG Chest 2 View  Result Date: 02/09/2020 CLINICAL DATA:  Cough and shortness of breath. Possible pneumonia on CT. Vomiting. EXAM: CHEST - 2 VIEW COMPARISON:  09/16/2016 FINDINGS: Heart size and pulmonary vascularity are normal. Mild hyperinflation in the lungs. Suggestion of infiltration in the left lung base. No consolidation. No pleural effusions. No pneumothorax. Mediastinal contours appear intact. IMPRESSION: Suggestion of infiltration in the left lung base. Electronically Signed   By: Lucienne Capers M.D.   On: 02/09/2020 02:09   CT ABDOMEN PELVIS W CONTRAST  Result Date: 02/09/2020 CLINICAL DATA:  Abdominal pain and fever.  Vomiting. EXAM: CT ABDOMEN AND PELVIS WITH CONTRAST TECHNIQUE: Multidetector CT imaging of the abdomen and pelvis was performed using the standard protocol following bolus administration of intravenous contrast. CONTRAST:  116mL OMNIPAQUE IOHEXOL 300 MG/ML  SOLN  COMPARISON:  CT chest 10/08/2015 FINDINGS: Lower chest: Motion artifact limits examination. Airspace infiltrates in the lung bases possibly due to pneumonia. Moderate esophageal hiatal hernia. Hepatobiliary: No focal liver abnormality is seen. No gallstones, gallbladder wall thickening, or biliary dilatation. Pancreas: Unremarkable. No pancreatic ductal dilatation or surrounding inflammatory changes. Spleen: Normal in size without focal abnormality. Adrenals/Urinary Tract: No adrenal gland nodules. Bilateral renal cysts. Nephrograms are symmetrical. No hydronephrosis or hydroureter. Bladder is unremarkable. Stomach/Bowel: Stomach, small bowel, and colon are not abnormally distended. No wall thickening or inflammatory change. Colon is diffusely stool-filled. Appendix is not identified. Vascular/Lymphatic: Aortic atherosclerosis. No enlarged abdominal or pelvic lymph nodes. Reproductive: Prostate gland is enlarged, measuring 5.1 cm diameter. Other: No abdominal wall hernia or abnormality. No abdominopelvic ascites. Musculoskeletal: No acute or significant osseous findings. IMPRESSION: 1. Airspace infiltrates in the lung bases possibly due to pneumonia. 2. Moderate esophageal hiatal hernia. 3. No evidence of bowel obstruction or inflammation. 4. Enlarged prostate gland. 5. Aortic atherosclerosis. Aortic Atherosclerosis (ICD10-I70.0). Electronically Signed   By: Lucienne Capers M.D.   On: 02/09/2020 01:29    ____________________________________________  PROCEDURES   Procedure(s) performed (including Critical Care):  Procedures  ____________________________________________  INITIAL IMPRESSION / MDM / [place] / ED COURSE  As part of my medical decision making, I reviewed the following data within the [facility] notes reviewed and incorporated, Old chart reviewed, Notes from prior ED visits, and [place] Controlled Substance Database       *Chukwuemeka Artola was  evaluated in Emergency Department on 02/09/2020 for the symptoms described in the history of present illness. He was evaluated in the context of the global COVID-19 pandemic, which necessitated consideration that the patient might be at risk for infection with the SARS-CoV-2 virus that causes COVID-19. Institutional protocols and algorithms that pertain to the evaluation of patients at risk for COVID-19 are in a state of rapid change based on information released by regulatory bodies including the CDC and federal and state organizations. These policies and algorithms were followed during the patient's care in the ED.  Some  ED evaluations and interventions may be delayed as a result of limited staffing during the pandemic.*     Medical Decision Making:  69 yo M here with nausea, vomiting, mild cough. Suspect symptoms are 2/2 a basilar CAP, with related n/v and epigastric abd discomfort. Otherwise, WBC normal, no lactic acidosis, no fever, no hypoxia or signs of sepsis or resp failure. Remainder of CT of his abd/pelvis is unremarkable with exception of PNA. COVID is negative. He feels much better after IVF, antiemetics. He is ambulatory w/o difficulty or hypoxia. Will treat for CAP. Given that he has difficulty remembering to take meds, will elect for levaquin for possibility of better adherence, and f/u with PCP.  ____________________________________________  FINAL CLINICAL IMPRESSION(S) / ED DIAGNOSES  Final diagnoses:  Community acquired pneumonia of left lower lobe of lung  Non-intractable vomiting with nausea, unspecified vomiting type     MEDICATIONS GIVEN DURING THIS VISIT:  Medications  sodium chloride 0.9 % bolus 1,000 mL (0 mLs Intravenous Stopped 02/09/20 0547)  iohexol (OMNIPAQUE) 300 MG/ML solution 100 mL (100 mLs Intravenous Contrast Given 02/09/20 0114)  ondansetron (ZOFRAN) injection 4 mg (4 mg Intravenous Given 02/09/20 0338)  morphine 4 MG/ML injection 4 mg (4 mg Intravenous Given  02/09/20 0339)  cefTRIAXone (ROCEPHIN) 2 g in sodium chloride 0.9 % 100 mL IVPB (0 g Intravenous Stopped 02/09/20 0547)  azithromycin (ZITHROMAX) tablet 500 mg (500 mg Oral Given 02/09/20 0551)     ED Discharge Orders         Ordered    levofloxacin (LEVAQUIN) 750 MG tablet  Daily     Discontinue  Reprint     02/09/20 0503    ondansetron (ZOFRAN) 4 MG tablet  Daily PRN     Discontinue  Reprint     02/09/20 0504    pantoprazole (PROTONIX) 40 MG tablet  Daily     Discontinue  Reprint     02/09/20 0511           Note:  This document was prepared using Dragon voice recognition software and may include unintentional dictation errors.   Duffy Bruce, MD 02/09/20 [phone]

## 2020-07-16 ENCOUNTER — Observation Stay (HOSPITAL_COMMUNITY): Payer: Medicare Other

## 2020-07-16 ENCOUNTER — Emergency Department (HOSPITAL_COMMUNITY): Payer: Medicare Other

## 2020-07-16 ENCOUNTER — Inpatient Hospital Stay (HOSPITAL_COMMUNITY)
Admission: EM | Admit: 2020-07-16 | Discharge: 2020-07-21 | DRG: 177 | Disposition: A | Discharge status: Home Health | Payer: Medicare Other | Attending: Internal Medicine | Admitting: Internal Medicine

## 2020-07-16 ENCOUNTER — Encounter (HOSPITAL_COMMUNITY): Payer: Self-pay | Admitting: Radiology

## 2020-07-16 DIAGNOSIS — R001 Bradycardia, unspecified: Secondary | ICD-10-CM | POA: Diagnosis present

## 2020-07-16 DIAGNOSIS — C760 Malignant neoplasm of head, face and neck: Secondary | ICD-10-CM

## 2020-07-16 DIAGNOSIS — E160 Drug-induced hypoglycemia without coma: Secondary | ICD-10-CM | POA: Diagnosis not present

## 2020-07-16 DIAGNOSIS — Z823 Family history of stroke: Secondary | ICD-10-CM

## 2020-07-16 DIAGNOSIS — Z85818 Personal history of malignant neoplasm of other sites of lip, oral cavity, and pharynx: Secondary | ICD-10-CM | POA: Diagnosis not present

## 2020-07-16 DIAGNOSIS — Z794 Long term (current) use of insulin: Secondary | ICD-10-CM

## 2020-07-16 DIAGNOSIS — E1169 Type 2 diabetes mellitus with other specified complication: Secondary | ICD-10-CM | POA: Diagnosis present

## 2020-07-16 DIAGNOSIS — R531 Weakness: Secondary | ICD-10-CM

## 2020-07-16 DIAGNOSIS — R471 Dysarthria and anarthria: Secondary | ICD-10-CM | POA: Diagnosis present

## 2020-07-16 DIAGNOSIS — J1282 Pneumonia due to coronavirus disease 2019: Secondary | ICD-10-CM | POA: Diagnosis present

## 2020-07-16 DIAGNOSIS — Z923 Personal history of irradiation: Secondary | ICD-10-CM | POA: Diagnosis not present

## 2020-07-16 DIAGNOSIS — G9341 Metabolic encephalopathy: Secondary | ICD-10-CM | POA: Diagnosis present

## 2020-07-16 DIAGNOSIS — G459 Transient cerebral ischemic attack, unspecified: Secondary | ICD-10-CM

## 2020-07-16 DIAGNOSIS — E876 Hypokalemia: Secondary | ICD-10-CM | POA: Diagnosis present

## 2020-07-16 DIAGNOSIS — Z791 Long term (current) use of non-steroidal anti-inflammatories (NSAID): Secondary | ICD-10-CM | POA: Diagnosis not present

## 2020-07-16 DIAGNOSIS — K219 Gastro-esophageal reflux disease without esophagitis: Secondary | ICD-10-CM

## 2020-07-16 DIAGNOSIS — E11649 Type 2 diabetes mellitus with hypoglycemia without coma: Secondary | ICD-10-CM | POA: Diagnosis present

## 2020-07-16 DIAGNOSIS — I10 Essential (primary) hypertension: Secondary | ICD-10-CM | POA: Diagnosis present

## 2020-07-16 DIAGNOSIS — Z9221 Personal history of antineoplastic chemotherapy: Secondary | ICD-10-CM

## 2020-07-16 DIAGNOSIS — Z79899 Other long term (current) drug therapy: Secondary | ICD-10-CM

## 2020-07-16 DIAGNOSIS — R0902 Hypoxemia: Secondary | ICD-10-CM

## 2020-07-16 DIAGNOSIS — T383X5A Adverse effect of insulin and oral hypoglycemic [antidiabetic] drugs, initial encounter: Secondary | ICD-10-CM | POA: Diagnosis not present

## 2020-07-16 DIAGNOSIS — J9601 Acute respiratory failure with hypoxia: Secondary | ICD-10-CM | POA: Diagnosis present

## 2020-07-16 DIAGNOSIS — R1310 Dysphagia, unspecified: Secondary | ICD-10-CM | POA: Diagnosis present

## 2020-07-16 DIAGNOSIS — Z87891 Personal history of nicotine dependence: Secondary | ICD-10-CM | POA: Diagnosis not present

## 2020-07-16 DIAGNOSIS — E782 Mixed hyperlipidemia: Secondary | ICD-10-CM | POA: Diagnosis present

## 2020-07-16 DIAGNOSIS — U071 COVID-19: Principal | ICD-10-CM | POA: Diagnosis present

## 2020-07-16 DIAGNOSIS — I6389 Other cerebral infarction: Secondary | ICD-10-CM | POA: Diagnosis not present

## 2020-07-16 DIAGNOSIS — G9340 Encephalopathy, unspecified: Secondary | ICD-10-CM

## 2020-07-16 DIAGNOSIS — E162 Hypoglycemia, unspecified: Secondary | ICD-10-CM

## 2020-07-16 LAB — DIFFERENTIAL
Abs Immature Granulocytes: 0.03 10*3/uL (ref 0.00–0.07)
Basophils Absolute: 0 10*3/uL (ref 0.0–0.1)
Basophils Relative: 0 %
Eosinophils Absolute: 0 10*3/uL (ref 0.0–0.5)
Eosinophils Relative: 0 %
Immature Granulocytes: 0 %
Lymphocytes Relative: 4 %
Lymphs Abs: 0.3 10*3/uL — ABNORMAL LOW (ref 0.7–4.0)
Monocytes Absolute: 0.6 10*3/uL (ref 0.1–1.0)
Monocytes Relative: 8 %
Neutro Abs: 6.5 10*3/uL (ref 1.7–7.7)
Neutrophils Relative %: 88 %

## 2020-07-16 LAB — CBG MONITORING, ED
Glucose-Capillary: 58 mg/dL — ABNORMAL LOW (ref 70–99)
Glucose-Capillary: 87 mg/dL (ref 70–99)
Glucose-Capillary: 89 mg/dL (ref 70–99)
Glucose-Capillary: 57 mg/dL — ABNORMAL LOW (ref 70–99)
Glucose-Capillary: 14 mg/dL — CL (ref 70–99)
Glucose-Capillary: 173 mg/dL — ABNORMAL HIGH (ref 70–99)
Glucose-Capillary: 88 mg/dL (ref 70–99)

## 2020-07-16 LAB — I-STAT CHEM 8, ED
BUN: 34 mg/dL — ABNORMAL HIGH (ref 8–23)
Calcium, Ion: 1.06 mmol/L — ABNORMAL LOW (ref 1.15–1.40)
Chloride: 104 mmol/L (ref 98–111)
Creatinine, Ser: 1.1 mg/dL (ref 0.61–1.24)
Glucose, Bld: 58 mg/dL — ABNORMAL LOW (ref 70–99)
HCT: 43 % (ref 39.0–52.0)
Hemoglobin: 14.6 g/dL (ref 13.0–17.0)
Potassium: 3.2 mmol/L — ABNORMAL LOW (ref 3.5–5.1)
Sodium: 143 mmol/L (ref 135–145)
TCO2: 28 mmol/L (ref 22–32)

## 2020-07-16 LAB — COMPREHENSIVE METABOLIC PANEL
ALT: 21 U/L (ref 0–44)
AST: 34 U/L (ref 15–41)
Albumin: 2.9 g/dL — ABNORMAL LOW (ref 3.5–5.0)
Alkaline Phosphatase: 59 U/L (ref 38–126)
Anion gap: 13 (ref 5–15)
BUN: 33 mg/dL — ABNORMAL HIGH (ref 8–23)
CO2: 25 mmol/L (ref 22–32)
Calcium: 8.7 mg/dL — ABNORMAL LOW (ref 8.9–10.3)
Chloride: 103 mmol/L (ref 98–111)
Creatinine, Ser: 1.17 mg/dL (ref 0.61–1.24)
GFR, Estimated: >60 mL/min (ref >60)
Glucose, Bld: 60 mg/dL — ABNORMAL LOW (ref 70–99)
Potassium: 3.3 mmol/L — ABNORMAL LOW (ref 3.5–5.1)
Sodium: 141 mmol/L (ref 135–145)
Total Bilirubin: 1.2 mg/dL (ref 0.3–1.2)
Total Protein: 7.8 g/dL (ref 6.5–8.1)

## 2020-07-16 LAB — CBC
HCT: 43.5 % (ref 39.0–52.0)
Hemoglobin: 13.3 g/dL (ref 13.0–17.0)
MCH: 25.8 pg — ABNORMAL LOW (ref 26.0–34.0)
MCHC: 30.6 g/dL (ref 30.0–36.0)
MCV: 84.3 fL (ref 80.0–100.0)
Platelets: 328 10*3/uL (ref 150–400)
RBC: 5.16 MIL/uL (ref 4.22–5.81)
RDW: 13.9 % (ref 11.5–15.5)
WBC: 7.4 10*3/uL (ref 4.0–10.5)
nRBC: 0 % (ref 0.0–0.2)

## 2020-07-16 LAB — TSH: TSH: 2.695 u[IU]/mL (ref 0.350–4.500)

## 2020-07-16 LAB — AMMONIA: Ammonia: 25 umol/L (ref 9–35)

## 2020-07-16 LAB — FOLATE: Folate: 4.6 ng/mL — ABNORMAL LOW (ref >5.9)

## 2020-07-16 LAB — LACTIC ACID, PLASMA: Lactic Acid, Venous: 1 mmol/L (ref 0.5–1.9)

## 2020-07-16 LAB — PROTIME-INR
INR: 1.2 (ref 0.8–1.2)
Prothrombin Time: 14.8 seconds (ref 11.4–15.2)

## 2020-07-16 LAB — TROPONIN I (HIGH SENSITIVITY): Troponin I (High Sensitivity): 91 ng/L — ABNORMAL HIGH (ref <18)

## 2020-07-16 LAB — POC SARS CORONAVIRUS 2 AG -  ED: SARS Coronavirus 2 Ag: POSITIVE — AB

## 2020-07-16 LAB — VITAMIN B12: Vitamin B-12: 144 pg/mL — ABNORMAL LOW (ref 180–914)

## 2020-07-16 LAB — APTT: aPTT: 31 seconds (ref 24–36)

## 2020-07-16 MED ORDER — LORAZEPAM 2 MG/ML IJ SOLN
1.0000 mg | Freq: Once | INTRAMUSCULAR | Status: AC
  Administered 2020-07-16: 1 mg via INTRAVENOUS
  Filled 2020-07-16: qty 1

## 2020-07-16 MED ORDER — DEXTROSE 50 % IV SOLN
INTRAVENOUS | Status: AC
  Administered 2020-07-16: 50 mL via INTRAVENOUS
  Filled 2020-07-16: qty 50

## 2020-07-16 MED ORDER — IOHEXOL 350 MG/ML SOLN
50.0000 mL | Freq: Once | INTRAVENOUS | Status: AC | PRN
  Administered 2020-07-16: 50 mL via INTRAVENOUS

## 2020-07-16 MED ORDER — INSULIN ASPART 100 UNIT/ML ~~LOC~~ SOLN: 0.0000 [IU] | SUBCUTANEOUS | Status: AC

## 2020-07-16 MED ORDER — DEXTROSE 50 % IV SOLN
25.0000 mL | Freq: Once | INTRAVENOUS | Status: AC
  Administered 2020-07-16: 25 mL via INTRAVENOUS

## 2020-07-16 MED ORDER — ACETAMINOPHEN 650 MG RE SUPP: 650.0000 mg | RECTAL | Status: DC | PRN

## 2020-07-16 MED ORDER — HYDRALAZINE HCL 20 MG/ML IJ SOLN: 10.0000 mg | Freq: Four times a day (QID) | INTRAMUSCULAR | Status: DC | PRN

## 2020-07-16 MED ORDER — DEXTROSE 50 % IV SOLN: 50.0000 mL | Freq: Once | INTRAVENOUS | Status: AC

## 2020-07-16 MED ORDER — DEXTROSE-NACL 5-0.9 % IV SOLN: INTRAVENOUS | Status: DC

## 2020-07-16 MED ORDER — ASPIRIN 300 MG RE SUPP: 300.0000 mg | Freq: Every day | RECTAL | Status: DC

## 2020-07-16 MED ORDER — DEXTROSE 50 % IV SOLN: 12.5000 g | Freq: Once | INTRAVENOUS | Status: AC

## 2020-07-16 MED ORDER — POTASSIUM CHLORIDE 10 MEQ/100ML IV SOLN
10.0000 meq | INTRAVENOUS | Status: AC
  Administered 2020-07-17 (×5): 10 meq via INTRAVENOUS
  Filled 2020-07-16 (×5): qty 100

## 2020-07-16 MED ORDER — ACETAMINOPHEN 160 MG/5ML PO SOLN: 650.0000 mg | ORAL | Status: DC | PRN

## 2020-07-16 MED ORDER — DEXTROSE 10 % IV SOLN
INTRAVENOUS | Status: DC
  Filled 2020-07-16: qty 1000

## 2020-07-16 MED ORDER — ENOXAPARIN SODIUM 40 MG/0.4ML ~~LOC~~ SOLN
40.0000 mg | Freq: Every day | SUBCUTANEOUS | Status: DC
  Administered 2020-07-17 – 2020-07-21 (×5): 40 mg via SUBCUTANEOUS
  Filled 2020-07-16 (×6): qty 0.4

## 2020-07-16 MED ORDER — ONDANSETRON HCL 4 MG/2ML IJ SOLN: 4.0000 mg | Freq: Four times a day (QID) | INTRAMUSCULAR | Status: DC | PRN

## 2020-07-16 MED ORDER — DEXTROSE 50 % IV SOLN
INTRAVENOUS | Status: AC
  Administered 2020-07-16: 12.5 g via INTRAVENOUS
  Filled 2020-07-16: qty 50

## 2020-07-16 MED ORDER — PANTOPRAZOLE SODIUM 40 MG IV SOLR
40.0000 mg | Freq: Every day | INTRAVENOUS | Status: DC
  Administered 2020-07-17: 40 mg via INTRAVENOUS
  Filled 2020-07-16 (×3): qty 40

## 2020-07-16 MED ORDER — SODIUM CHLORIDE 0.9% FLUSH
3.0000 mL | Freq: Once | INTRAVENOUS | Status: AC
Start: 2020-07-16 — End: 2020-07-16
  Administered 2020-07-16: 3 mL via INTRAVENOUS

## 2020-07-16 MED ORDER — STROKE: EARLY STAGES OF RECOVERY BOOK
Freq: Once | Status: DC
  Filled 2020-07-16: qty 1

## 2020-07-16 MED ORDER — ACETAMINOPHEN 325 MG PO TABS
650.0000 mg | ORAL_TABLET | ORAL | Status: DC | PRN
  Administered 2020-07-18: 650 mg via ORAL
  Filled 2020-07-16: qty 2

## 2020-07-16 MED ORDER — ASPIRIN 325 MG PO TABS
325.0000 mg | ORAL_TABLET | Freq: Every day | ORAL | Status: DC
  Administered 2020-07-16 – 2020-07-17 (×2): 325 mg via ORAL
  Filled 2020-07-16 (×2): qty 1

## 2020-07-16 NOTE — ED Triage Notes (Signed)
Per ems they were called out after patients family went to check on the patient and found him not acting his normal self. Unsure patients LKW. Ems states patients speech is different and pt having left sided weakness and unable to ambulate like normal. With ems pt sugar 48. Pt alert and oriented.

## 2020-07-16 NOTE — ED Notes (Signed)
Patient transported to CT 

## 2020-07-16 NOTE — Consult Note (Addendum)
Neurology Consultation  CC: Code stroke; per EMS left sided weakness, slurred speech  History is obtained from: EMS, patient, chart review  HPI: Louis Stone is a 70 y.o. male with a history significant for type 2 diabetes mellitus, oropharyngeal cancer, osteoarthritis, past history of smoking (quit in 2008) who presented to [facility] today as a stroke alert. Patient was found by family at his girlfriend's home with slurred speech and left arm weakness. EMS arrived and noted he had an altered and unsteady gait in addition to his slurred speech and left arm drift. His blood glucose was checked and was found to be 48. EMS administered 213mL of D5W with improvement in blood glucose to 82. On arrival to the ED, there was no weakness noted in his extremities and only his slurred speech persisted. His blood glucose was found to be 58 on arrival to the ED.   Patient is able to give history but the consistency and accuracy is undetermined without collateral. He lives alone and states he woke up around 10:00AM this morning laying beside of the bed and struggled to get up because he was weak (generalized). He states he later managed to ambulate to take his 25 units of insulin this morning but endorses feeling "crummy" recently and not having much of an appetite. He did claim that he did not check his blood glucose this morning prior to the insulin administration. He claims the small area of edema and scab to his left forehead was from bumping his head on a cabinet but he does not recall falling or injuring his head.    LKW: 07/15/2020 before bed (22:00 approximately with patient's best guess) tpa given?: No, last known well unknown.  IR Thrombectomy? No, no LVO Modified Rankin Scale: 0-Completely asymptomatic and back to baseline post- stroke NIHSS:  1a Level of Conscious.: 0 1b LOC Questions: 0 1c LOC Commands: 0 2 Best Gaze: 0 3 Visual: 0 4 Facial Palsy: 0 5a Motor Arm - left: 0 5b Motor Arm - Right:  0 6a Motor Leg - Left: 0 6b Motor Leg - Right: 0 7 Limb Ataxia: 0 8 Sensory: 0 9 Best Language: 0 10 Dysarthria: 1 11 Extinct. and Inatten.: 0 TOTAL: 1  ROS: A complete ROS was performed and is negative except as noted in the HPI.  Past Medical History:  Diagnosis Date  . Arthritis    per pt. I do not have md notes that indicate dx.  . Diabetes mellitus without complication ([place])   . 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 Personal history of tobacco use, presenting hazards to health 10/08/2015   Family History  Problem Relation Age of Onset  . CVA Father   . Prostate cancer Neg Hx        not sure what family member   Social History:  reports that he quit smoking about 14 years ago. He has a 60.00 pack-year smoking history. He has never used smokeless tobacco. He reports that he does not drink alcohol and does not use drugs.  Current Scheduled Medications: Current Outpatient Medications  Medication Instructions  . albuterol (PROVENTIL HFA;VENTOLIN HFA) 108 (90 Base) MCG/ACT inhaler 2 puffs, Inhalation  . azithromycin (ZITHROMAX) 250 mg, Oral, Daily  . benzonatate (TESSALON) 200 mg, Oral, 3 times daily PRN  . famotidine (PEPCID) 40 mg, Oral, Every evening  . ondansetron (ZOFRAN) 4 mg, Oral, Daily PRN  . pantoprazole (PROTONIX) 40 mg, Oral, Daily  .  sucralfate (CARAFATE) 1 g, Oral, 4 times daily  . Toujeo SoloStar 18 Units, Subcutaneous, Daily   Exam: Current vital signs: BP (!) 155/82   Pulse 77   Resp 19   SpO2 97%   Physical Exam  Constitutional: Appears disheveled, in no acute distress Psych: Affect appropriate to situation HEENT: Small laceration and area of edema to the left forehead without active hemorrhage, wears eyeglasses at baseline, head normocephalic. Corneal arcus present bilaterally.  Cardiovascular: Extremities warm without edema Respiratory: Effort normal, non-labored breathing.  GI: Soft.  No distension.  Skin:  skin is dry, intact  Neuro: Mental Status: Patient is awake, alert to self, time, age, and place. Unclear if patient's history is clear and coherent without collateral. Occasionally changes his history depending on which examiner is asking. No aphasia or neglect noted. Follows all commands, intermittently struggles with 2-step commands.  Speech is dysarthric, per EMS/family his speech is acutely altered.  Cranial Nerves: II: Visual Fields are full. Pupils are equal, round, and reactive to light 66mm/brisk.  III,IV, VI: EOMI. No ptosis or diplopia noted.  V: Facial sensation is symmetric to light touch.  VII: Facial movement is symmetric resting and smiling.   VIII: Hearing is intact to voice X: Phonation intact. XI: Shoulder shrug is symmetric. XII: Tongue protrudes midline  Motor: All extremities with antigravity movement without pronator drift. Upper and lower extremities 5/5 and equal bilaterally. Tone is normal. Bulk is normal.  Sensory: Sensation is symmetric to light touch in the arms and legs. Plantars: Toes are downgoing bilaterally.  Cerebellar: FNF and HKS are intact bilaterally. Gait: Deferred  I have reviewed labs in epic and the pertinent results are: Blood glucose 48 PTA improved to 82 with EMS en route with 237mL D5W. Blood glucose 58 on arrival to ER; patient was given 1/2 amp of 10% dextrose  CBC    Component Value Date/Time   WBC 7.4 07/16/2020 1538   RBC 5.16 07/16/2020 1538   HGB 14.6 07/16/2020 1550   HCT 43.0 07/16/2020 1550   PLT 328 07/16/2020 1538   MCV 84.3 07/16/2020 1538   MCH 25.8 (L) 07/16/2020 1538   MCHC 30.6 07/16/2020 1538   RDW 13.9 07/16/2020 1538   LYMPHSABS 0.3 (L) 07/16/2020 1538   MONOABS 0.6 07/16/2020 1538   EOSABS 0.0 07/16/2020 1538   BASOSABS 0.0 07/16/2020 1538   CMP     Component Value Date/Time   NA 143 07/16/2020 1550   K 3.2 (L) 07/16/2020 1550   CL 104 07/16/2020 1550   CO2 25 07/16/2020 1538   GLUCOSE 58 (L)  07/16/2020 1550   BUN 34 (H) 07/16/2020 1550   CREATININE 1.10 07/16/2020 1550   CALCIUM 8.7 (L) 07/16/2020 1538   PROT 7.8 07/16/2020 1538   ALBUMIN 2.9 (L) 07/16/2020 1538   AST 34 07/16/2020 1538   ALT 21 07/16/2020 1538   ALKPHOS 59 07/16/2020 1538   BILITOT 1.2 07/16/2020 1538   GFRNONAA >60 07/16/2020 1538   GFRAA >60 02/08/2020 1304   I have reviewed the images obtained: CT head IMPRESSION: 1. Asymmetric blurring of the left frontotemporal gray-white junctions may reflect artifact versus acute insult. 2. ASPECTS is 8  CT angio head and neck IMPRESSION: No large vessel occlusion. Circumferential plaque about the right common carotid causing 50% stenosis. No hemodynamically significant stenosis at the ICA origins. Noncalcified plaque along the distal left V1 vertebral artery causing 50% stenosis. Asymmetric enhancement at the left lateral aspect of the tongue (series 7,  image 85). Recommend direct visual inspection in this patient with history of head/neck cancer. Nonenlarged, but prominent submental lymph node.  Primary Diagnosis:  Hypoglycemia- symptomatic  Secondary Diagnosis: Type 2 diabetes mellitus with complications  Assessment: 70 year old male who lives alone and last known well unknown presents with slurred speech and transient left-sided weakness that had resolved on hospital arrival. Patient found to be hypoglycemic with persistent slurred speech but transient left-sided weakness with correction of blood glucose.  CT head with questionable artifact versus insult in the left frontotemporal area and CT angio with approximately 50% stenosis of the right common carotid and distal left V1 vertebral artery but without LVO. DDx include hypoglycemia and TIA/ischemic etiology from embolic or atherosclerotic source.   Impression:  - Symptomatic hypoglycemia - Concern for TIA/ischemic etiology for symptoms with multiple stroke risk factors- history of cancer, type 2 diabetes  mellitus with complications (last I6N 62.9 in 2018 with large amounts of glucosuria noted 02/08/2020).  Recommendations: - Frequent blood glucose monitoring and correction of hypoglycemia - HgbA1c, fasting lipid panel - MRI, MRA of the brain without contrast to further evaluate CT head findings: artifact versus acute insult - Frequent neuro checks - Echocardiogram - Prophylactic therapy- Antiplatelet med: Aspirin - dose 325mg  PO or 300mg  PR - Risk factor modification - Telemetry monitoring - PT consult, OT consult, Speech consult - Stroke team to follow  Examination performed alongside attending provider, Dr. Erlinda Hong. Assessment and plan to be edited by attending provider as needed.   Anibal Henderson, AGAC-NP [facility] [phone]  ATTENDING NOTE: I reviewed above note and agree with the assessment and plan. Pt was seen and examined.   70 year old male with history of diabetes living alone was found to have left-sided weakness and slurred speech. Per EMS, patient lives with "a lady" who has died in the hospital over the weekend. Therefore, patient lives by himself for the last several days. This afternoon around 2:30 PM, the lady's daughter came back home and found patient to have left-sided weakness and slurred speech. Patient not a good historian, however, he denies he has a slurred speech but stated that this morning he could not get out of bed due to weakness. He cannot elaborate further about his weakness. Time onset unclear. EMS was called at that time. On arrival he was found to have left-sided and slurred speech, not able to walk, but also found glucose 48. He was given D5 250 cc and recheck glucose 82. However his symptoms did not improve. Code stroke activated. In route, EKG concerning for V1 to V3 ST elevation, code STEMI was also activated. However, EKG was with a lot of artifact. He is EKG on arrival showed sinus rhythm, no STEMI. Also on arrival, patient left-sided  weakness has resolved. Still has slurred speech, however patient said that was his baseline. Otherwise neurologically intact, NIH score 1 for dysarthria. CT no acute abnormality. CT head and neck showed right common carotid mid segment 50% stenosis, left V1 50% stenosis, no LVO.  Pt not tPA candidate due to unclear LSW, low NIHSS with nondisabling symptoms and likely nonstroke. Not IR candidate given no LVO and low NIHSS. Etiology for patient's symptoms could be due to hypoglycemia. Currently glucose 58 on CMP at ED arrival but a finger stick 87. We will give D50 12.5 g and put on D5NS until swallow screen to see better he can be put on diet. DDx also including TIA given his risk factors. Recommend TIA work-up including MRI, 2D echo,  LDL and A1c. Recommend internal medicine admission for overnight observation while getting TIA work-up done and getting his glucose stabilized. We will follow.  Rosalin Hawking, MD PhD Stroke Neurology 07/16/2020 5:49 PM

## 2020-07-16 NOTE — ED Provider Notes (Signed)
[facility] EMERGENCY DEPARTMENT Provider Note   CSN: [id number] Arrival date & time: 07/16/20  1533     History Chief Complaint  Patient presents with   Code Stroke    Louis Stone is a 70 y.o. male.  HPI Patient presents via EMS as a code stroke and a code STEMI. Patient himself is a withdrawn, slow historian, but seemingly has developed dysarthria, weakness and gait difficulties. History is obtained by EMS providers, who spoke with family prior to transfer. Unclear last seen normal. Patient has multiple medical issues including prior cancer, diabetes. Seemingly, however, the patient was in his usual state of health until developing dysarthria, gait instability and left-sided weakness. Patient denies pain, acknowledges weakness, otherwise seemingly denies focal changes, though there is some lack of clarity given the patient's withdrawn status, dysarthria, level 5 caveat secondary to acuity of condition. EMS reports that the patient seemingly had ST elevations in his anterior leads on monitoring in route and he was designated as a code STEMI as well.     Past Medical History:  Diagnosis Date   Arthritis    per pt. I do not have md notes that indicate dx.   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) 07/16/2020   COVID-19 virus infection 07/16/2020   GERD without esophagitis 07/16/2020   Bradycardia 07/16/2020   Sepsis ([place]) 09/16/2016   Personal  history of tobacco use, presenting hazards to health 10/08/2015    Past Surgical History:  Procedure Laterality Date   [place]   pt states ruptured disc and had surgery to repair it       Family History  Problem Relation Age of Onset   CVA Father    Prostate cancer Neg Hx        not sure what family member    Social History   Tobacco Use   Smoking status: Former Smoker    Packs/day: 1.50    Years: 40.00    Pack years: 60.00    Quit date: 07/05/2006    Years since quitting: 14.0   Smokeless tobacco: Never Used  Substance Use Topics   Alcohol use: No    Comment: quit in 1991   Drug use: No    Home Medications Prior to Admission medications   Medication Sig Start Date End Date Taking? Authorizing Provider  acetaminophen (TYLENOL) 325 MG tablet Take 650 mg by mouth every 6 (six) hours as needed for headache or mild pain.   Yes [provider]  albuterol (PROVENTIL HFA;VENTOLIN HFA) 108 (90 Base) MCG/ACT inhaler Inhale 2 puffs into the lungs every 6 (six) hours as needed for wheezing.   Yes [provider]  atorvastatin (LIPITOR) 20 MG tablet Take 20 mg by mouth daily. 06/06/20  Yes [provider]  benzonatate (TESSALON) 200 MG capsule Take 200 mg by mouth 3 (three) times daily as needed for cough.   Yes [provider]  famotidine (PEPCID) 40 MG tablet Take 40 mg by mouth daily.  Yes [provider]  lisinopril (ZESTRIL) 5 MG tablet Take 5 mg by mouth daily. 06/06/20  Yes [provider]  meloxicam (MOBIC) 7.5 MG tablet Take 7.5 mg by mouth 2 (two) times daily. 06/06/20  Yes [provider]  omeprazole (PRILOSEC) 40 MG capsule Take 40 mg by mouth daily. 06/06/20  Yes [provider]  ondansetron (ZOFRAN) 4 MG tablet Take 1 tablet (4 mg total) by mouth daily as needed for nausea or vomiting. 02/09/20 02/08/21 Yes Duffy Bruce, MD  TOUJEO SOLOSTAR 300 UNIT/ML SOPN Inject 18 Units into  the skin daily. Patient taking differently: Inject 25 Units into the skin daily. 09/18/16  Yes Hillary Bow, MD    Allergies    Patient has no known allergies.  Review of Systems   Review of Systems  Unable to perform ROS: Acuity of condition    Physical Exam Updated Vital Signs BP (!) 160/100 (BP Location: Right Arm)    Pulse 71    Temp 97.9 F (36.6 C) (Oral)    Resp 17    SpO2 93%   Physical Exam Vitals and nursing note reviewed.  Constitutional:      Appearance: He is well-developed. He is ill-appearing. He is not diaphoretic.  HENT:     Head: Normocephalic and atraumatic.  Eyes:     Extraocular Movements: EOM normal.     Conjunctiva/sclera: Conjunctivae normal.  Cardiovascular:     Rate and Rhythm: Normal rate and regular rhythm.  Pulmonary:     Effort: Pulmonary effort is normal. No respiratory distress.     Breath sounds: No stridor.  Abdominal:     General: There is no distension.     Tenderness: There is no abdominal tenderness.  Musculoskeletal:        General: No deformity or edema.  Skin:    General: Skin is warm and dry.  Neurological:     Mental Status: He is alert.     Comments: Face is symmetric, though dysarthria is obvious.  Patient follows commands appropriately, lifting both lower extremities, both upper extremities, strength is 4/5 throughout, the patient describes more weakness in the left upper extremity.   Psychiatric:        Mood and Affect: Mood and affect normal.        Behavior: Behavior is slowed and withdrawn.        Cognition and Memory: Cognition is impaired. Memory is impaired.     ED Results / Procedures / Treatments   Labs (all labs ordered are listed, but only abnormal results are displayed) Labs Reviewed  CBC - Abnormal; Notable for the following components:      Result Value   MCH 25.8 (*)    All other components within normal limits  DIFFERENTIAL - Abnormal; Notable for the following components:   Lymphs Abs 0.3 (*)     All other components within normal limits  COMPREHENSIVE METABOLIC PANEL - Abnormal; Notable for the following components:   Potassium 3.3 (*)    Glucose, Bld 60 (*)    BUN 33 (*)    Calcium 8.7 (*)    Albumin 2.9 (*)    All other components within normal limits  HEMOGLOBIN A1C - Abnormal; Notable for the following components:   Hgb A1c MFr Bld 6.3 (*)    All other components within normal limits  LIPID PANEL - Abnormal; Notable for the following components:   HDL 26 (*)    All other components within normal limits  VITAMIN B12 - Abnormal; Notable for the following components:   Vitamin B-12 144 (*)    All other components within normal limits  URINALYSIS, COMPLETE (UACMP) WITH MICROSCOPIC - Abnormal; Notable for the following components:   APPearance HAZY (*)    Specific Gravity, Urine >1.046 (*)    Glucose, UA 150 (*)    Hgb urine dipstick SMALL (*)    Protein, ur 30 (*)    Bacteria, UA RARE (*)    All other components within normal limits  FOLATE - Abnormal; Notable for the following components:   Folate 4.6 (*)    All other components within normal limits  BASIC METABOLIC PANEL - Abnormal; Notable for the following components:   Potassium 3.4 (*)    Glucose, Bld 67 (*)    BUN 25 (*)    Calcium 8.6 (*)    All other components within normal limits  GLUCOSE, CAPILLARY - Abnormal; Notable for the following components:   Glucose-Capillary 128 (*)    All other components within normal limits  I-STAT CHEM 8, ED - Abnormal; Notable for the following components:   Potassium 3.2 (*)    BUN 34 (*)    Glucose, Bld 58 (*)    Calcium, Ion 1.06 (*)    All other components within normal limits  CBG MONITORING, ED - Abnormal; Notable for the following components:   Glucose-Capillary 58 (*)    All other components within normal limits  POC SARS CORONAVIRUS 2 AG -  ED - Abnormal; Notable for the following components:   SARS Coronavirus 2 Ag POSITIVE (*)    All other components within  normal limits  CBG MONITORING, ED - Abnormal; Notable for the following components:   Glucose-Capillary <10 (*)    All other components within normal limits  CBG MONITORING, ED - Abnormal; Notable for the following components:   Glucose-Capillary 57 (*)    All other components within normal limits  CBG MONITORING, ED - Abnormal; Notable for the following components:   Glucose-Capillary 14 (*)    All other components within normal limits  CBG MONITORING, ED - Abnormal; Notable for the following components:   Glucose-Capillary 173 (*)    All other components within normal limits  I-STAT VENOUS BLOOD GAS, ED - Abnormal; Notable for the following components:   pO2, Ven 178.0 (*)    Bicarbonate 33.4 (*)    TCO2 35 (*)    Acid-Base Excess 6.0 (*)    Potassium 3.4 (*)    All other components within normal limits  CBG MONITORING, ED - Abnormal; Notable for the following components:   Glucose-Capillary 58 (*)    All other components within normal limits  CBG MONITORING, ED - Abnormal; Notable for the following components:   Glucose-Capillary 58 (*)    All other components within normal limits  CBG MONITORING, ED - Abnormal; Notable for the following components:   Glucose-Capillary 157 (*)    All other components within normal limits  TROPONIN I (HIGH SENSITIVITY) - Abnormal; Notable for the following components:   Troponin I (High Sensitivity) 91 (*)    All other components within normal limits  PROTIME-INR  APTT  RAPID URINE DRUG SCREEN, HOSP PERFORMED  AMMONIA  LACTIC ACID, PLASMA  TSH  MAGNESIUM  HIV ANTIBODY (ROUTINE TESTING W REFLEX)  BLOOD GAS, VENOUS  COMPREHENSIVE METABOLIC PANEL  C-REACTIVE PROTEIN  D-DIMER, QUANTITATIVE (NOT AT [facility])  MAGNESIUM  CBC  PHOSPHORUS  CBG MONITORING, ED  CBG MONITORING, ED  CBG MONITORING, ED  CBG MONITORING, ED  CBG MONITORING, ED  CBG MONITORING, ED  CBG MONITORING, ED  CBG MONITORING, ED  CBG MONITORING, ED  CBG MONITORING, ED   CBG MONITORING, ED  CBG MONITORING, ED    EKG EKG Interpretation  Date/Time:  Wednesday July 16 2020 16:11:39 EST Ventricular Rate:  78 PR Interval:    QRS Duration: 113 QT Interval:  449 QTC Calculation: 512 R Axis:   -27 Text Interpretation: Sinus rhythm Probable left ventricular hypertrophy Anterior Q waves, possibly due to LVH Borderline T abnormalities, inferior leads Prolonged QT interval Baseline wander Artifact Abnormal ECG Confirmed by Carmin Muskrat [phone]) on 07/16/2020 4:47:15 PM   Radiology CT ANGIO HEAD W OR WO CONTRAST  Result Date: 07/16/2020 CLINICAL DATA:  Code stroke follow-up EXAM: CT ANGIOGRAPHY HEAD AND NECK TECHNIQUE: Multidetector CT imaging of the head and neck was performed using the standard protocol during bolus administration of intravenous contrast. Multiplanar CT image reconstructions and MIPs were obtained to evaluate the vascular anatomy. Carotid stenosis measurements (when applicable) are obtained utilizing NASCET criteria, using the distal internal carotid diameter as the denominator. CONTRAST:  49mL OMNIPAQUE IOHEXOL 350 MG/ML SOLN COMPARISON:  None. FINDINGS: CTA NECK Aortic arch: Great vessel origins are patent. Right carotid system: There is circumferential plaque about the common carotid causing 50% stenosis. Mixed plaque is present at the ICA origin with minimal stenosis. Left carotid system: Patent. Mild noncalcified plaque along the common carotid causing less than 50% stenosis. Mixed plaque at the ICA origin causing minimal stenosis. Vertebral arteries: Patent and is codominant. Eccentric noncalcified plaque along the distal left V1 segment causing 50% stenosis. Skeleton: Mild cervical spine degenerative changes. Other neck: Probable radiation changes in the neck; correlate with history. There is asymmetric enhancement at the left lateral aspect of the tongue (series 7, image 85). No enlarged lymph nodes. Prominent level 1 submental node is not  pathologically enlarged. Upper chest: Scarring at the lung apices. Review of the MIP images confirms the above findings CTA HEAD Anterior circulation: Intracranial internal carotid arteries are patent with calcified plaque causing mild stenosis. Anterior and middle cerebral arteries are patent. Posterior circulation: Intracranial vertebral arteries, basilar artery, and posterior cerebral arteries are patent. Venous sinuses: Patent as allowed by contrast bolus timing. Review of the MIP images confirms the above findings IMPRESSION: No large vessel occlusion. Circumferential plaque about the right common carotid causing 50% stenosis. No hemodynamically significant stenosis at the ICA origins. Noncalcified plaque along the distal left V1 vertebral artery causing 50% stenosis. Asymmetric enhancement at the left lateral aspect of the tongue (series 7, image 85). Recommend direct visual inspection in this patient with history of head/neck cancer. Nonenlarged, but prominent submental lymph node. Electronically Signed   By: Macy Mis M.D.   On: 07/16/2020 16:29   DG Chest 1 View  Result Date: 07/17/2020 CLINICAL DATA:  Hypoxia, COVID-19, code stroke EXAM: CHEST  1 VIEW COMPARISON:  Radiograph 02/09/2020, CT 10/08/2015 FINDINGS: Heterogeneous areas of coarse interstitial and patchy opacity throughout both lungs. No pneumothorax. No effusion. Pulmonary vascularity is normally distributed. The aorta is calcified. The remaining cardiomediastinal contours are unremarkable. No acute osseous or soft tissue abnormality. Degenerative changes are present in the imaged spine and shoulders. Telemetry leads overlie the chest. IMPRESSION: Heterogeneous areas of coarse interstitial and patchy opacity throughout both lungs, compatible with a multifocal infection in the setting of COVID-19. Electronically Signed   By: Lovena Le M.D.   On: 07/17/2020 00:33  CT ANGIO NECK W OR WO CONTRAST  Result Date: 07/16/2020 CLINICAL  DATA:  Code stroke follow-up EXAM: CT ANGIOGRAPHY HEAD AND NECK TECHNIQUE: Multidetector CT imaging of the head and neck was performed using the standard protocol during bolus administration of intravenous contrast. Multiplanar CT image reconstructions and MIPs were obtained to evaluate the vascular anatomy. Carotid stenosis measurements (when applicable) are obtained utilizing NASCET criteria, using the distal internal carotid diameter as the denominator. CONTRAST:  31mL OMNIPAQUE IOHEXOL 350 MG/ML SOLN COMPARISON:  None. FINDINGS: CTA NECK Aortic arch: Great vessel origins are patent. Right carotid system: There is circumferential plaque about the common carotid causing 50% stenosis. Mixed plaque is present at the ICA origin with minimal stenosis. Left carotid system: Patent. Mild noncalcified plaque along the common carotid causing less than 50% stenosis. Mixed plaque at the ICA origin causing minimal stenosis. Vertebral arteries: Patent and is codominant. Eccentric noncalcified plaque along the distal left V1 segment causing 50% stenosis. Skeleton: Mild cervical spine degenerative changes. Other neck: Probable radiation changes in the neck; correlate with history. There is asymmetric enhancement at the left lateral aspect of the tongue (series 7, image 85). No enlarged lymph nodes. Prominent level 1 submental node is not pathologically enlarged. Upper chest: Scarring at the lung apices. Review of the MIP images confirms the above findings CTA HEAD Anterior circulation: Intracranial internal carotid arteries are patent with calcified plaque causing mild stenosis. Anterior and middle cerebral arteries are patent. Posterior circulation: Intracranial vertebral arteries, basilar artery, and posterior cerebral arteries are patent. Venous sinuses: Patent as allowed by contrast bolus timing. Review of the MIP images confirms the above findings IMPRESSION: No large vessel occlusion. Circumferential plaque about the right  common carotid causing 50% stenosis. No hemodynamically significant stenosis at the ICA origins. Noncalcified plaque along the distal left V1 vertebral artery causing 50% stenosis. Asymmetric enhancement at the left lateral aspect of the tongue (series 7, image 85). Recommend direct visual inspection in this patient with history of head/neck cancer. Nonenlarged, but prominent submental lymph node. Electronically Signed   By: Macy Mis M.D.   On: 07/16/2020 16:29     CT HEAD CODE STROKE WO CONTRAST  Result Date: 07/16/2020 CLINICAL DATA:  Code stroke.  Neuro deficit, acute, stroke suspected EXAM: CT HEAD WITHOUT CONTRAST TECHNIQUE: Contiguous axial images were obtained from the base of the skull through the vertex without intravenous contrast. COMPARISON:  None. FINDINGS: Brain: No intracranial hemorrhage. Asymmetric blurring of the gray-white junctions involving the left frontotemporal region. No mass lesion. No midline shift, ventriculomegaly or extra-axial fluid collection. Vascular: No hyperdense vessel or unexpected calcification. Bilateral skull base atherosclerotic calcifications. Skull: Negative for fracture or focal lesion. Sinuses/Orbits: Normal orbits. Right maxillary sinus secretions. No mastoid effusion. Other: None. ASPECTS [facility] Stroke Program Early CT Score) - Ganglionic level infarction (caudate, lentiform nuclei, internal capsule, insula, M1-M3 cortex): 6 - Supraganglionic infarction (M4-M6 cortex): 2 Total score (0-10 with 10 being normal): 8 IMPRESSION: 1. Asymmetric blurring of the left frontotemporal gray-white junctions may reflect artifact versus acute insult. 2. ASPECTS is 8 3. Code stroke imaging results were communicated on 07/16/2020 at 3:49 pm to provider Dr. Erlinda Hong Via secure text paging. Electronically Signed   By: Primitivo Gauze M.D.   On: 07/16/2020 16:00     Procedures Procedures (including critical care time)  Medications Ordered in ED Medications   stroke:  mapping our early stages of recovery book (has no administration in time range)  dextrose 10 % infusion (  Intravenous Rate/Dose Change 07/17/20 1510)  pantoprazole (PROTONIX) injection 40 mg (40 mg Intravenous Given 07/17/20 0920)  acetaminophen (TYLENOL) tablet 650 mg (has no administration in time range)    Or  acetaminophen (TYLENOL) 160 MG/5ML solution 650 mg (has no administration in time range)    Or  acetaminophen (TYLENOL) suppository 650 mg (has no administration in time range)  enoxaparin (LOVENOX) injection 40 mg (40 mg Subcutaneous Given 07/17/20 0920)  ondansetron (ZOFRAN) injection 4 mg (has no administration in time range)  hydrALAZINE (APRESOLINE) injection 10 mg (has no administration in time range)  insulin aspart (novoLOG) injection 0-3 Units (0 Units Subcutaneous Not Given 07/17/20 0619)  remdesivir 200 mg in sodium chloride 0.9% 250 mL IVPB (0 mg Intravenous Stopped 07/17/20 1724)    Followed by  remdesivir 100 mg in sodium chloride 0.9 % 100 mL IVPB (has no administration in time range)  albuterol (VENTOLIN HFA) 108 (90 Base) MCG/ACT inhaler 2 puff (has no administration in time range)  guaiFENesin-dextromethorphan (ROBITUSSIN DM) 100-10 MG/5ML syrup 10 mL (has no administration in time range)  chlorpheniramine-HYDROcodone (TUSSIONEX) 10-8 MG/5ML suspension 5 mL (has no administration in time range)  ascorbic acid (VITAMIN C) tablet 500 mg (500 mg Oral Not Given 07/17/20 1008)  zinc sulfate capsule 220 mg (220 mg Oral Not Given 07/17/20 1004)  aspirin EC tablet 81 mg (has no administration in time range)  atorvastatin (LIPITOR) tablet 20 mg (20 mg Oral Given 07/17/20 2118)  sodium chloride flush (NS) 0.9 % injection 3 mL (3 mLs Intravenous Given 07/16/20 1623)  iohexol (OMNIPAQUE) 350 MG/ML injection 50 mL (50 mLs Intravenous Contrast Given 07/16/20 1605)  dextrose 50 % solution 12.5 g (12.5 g Intravenous Given 07/16/20 1622)  LORazepam (ATIVAN) injection 1 mg (1 mg Intravenous  Given 07/16/20 1935)  dextrose 50 % solution 25 mL (25 mLs Intravenous Given 07/16/20 1935)  dextrose 50 % solution 50 mL (50 mLs Intravenous Given 07/16/20 2209)  potassium chloride 10 mEq in 100 mL IVPB (0 mEq Intravenous Stopped 07/17/20 [id number])    ED Course  I have reviewed the triage vital signs and the nursing notes.  Pertinent labs & imaging results that were available during my care of the patient were reviewed by me and considered in my medical decision making (see chart for details).   With consideration of stroke first STEMI patient had expedited evaluation in the EMS arrival bay.  Without chest pain, I reviewed his EKG, canceled the code STEMI. In regards to the patient's code stroke he was evaluated with our neurology colleagues, patient had expedited CT, labs, monitoring.  Update:, Initial labs notable for mild hypokalemia, mild hypoglycemia.  Update:, Patient started on D5, half-normal saline drip after he had a recurrence of hypoglycemia. Soon thereafter the patient became agitated, while he had a brief cessation of his medication in an attempt to get an MRI. Upon returning to the ED the patient was found to have hypoglycemia, value of 10.  This again improved with dextrose bolus, patient was started on a D10 drip.  Update:, Patient again back to condition upon arrival, with dysarthria persistently, but spontaneous movement of his left extremities.  Elderly male presents as a code stroke, initially as a code STEMI as well, though this latter concern was canceled. Patient has dysarthria, but the rest of his neurologic exam is inconsistent, with ongoing motion in all extremities. Patient symptoms more consistent to hypoglycemia, and at one point he acknowledges having taken his insulin in the morning, not  eating anything afterwards. Hypoglycemia was difficult control, required multiple boluses of dextrose, as well as drips, first D5, then D10. With consideration of hypoglycemia  versus stroke, the patient will require admission, and I discussed his case with internal medicine colleagues.  Next day EMR completion: MRI does not demonstrate stroke. Final Clinical Impression(s) / ED Diagnoses Final diagnoses:  Dysarthria  Hypoglycemia  Encephalopathy   MDM Number of Diagnoses or Management Options Dysarthria: new, needed workup Encephalopathy: new, needed workup Hypoglycemia: new, needed workup   Amount and/or Complexity of Data Reviewed Clinical lab tests: reviewed Tests in the radiology section of CPT: reviewed Tests in the medicine section of CPT: reviewed Decide to obtain previous medical records or to obtain history from someone other than the patient: yes Obtain history from someone other than the patient: yes Review and summarize past medical records: yes Discuss the patient with other providers: yes Independent visualization of images, tracings, or specimens: yes  Risk of Complications, Morbidity, and/or Mortality Presenting problems: high Diagnostic procedures: high Management options: high  Critical Care Total time providing critical care: 30-74 minutes (35)  Patient Progress Patient progress: stable     Carmin Muskrat, MD 07/17/20 2329

## 2020-07-16 NOTE — ED Notes (Signed)
Patient very drowsy and not following commands. Provider made aware of this and CBG being 57.

## 2020-07-16 NOTE — ED Notes (Signed)
MRI called.patient being very combative and not following commands. When this RN arrived, patient being very combative with staff and not Alert and oriented. Pt not redirectable. Provider made aware. CBG checked.

## 2020-07-16 NOTE — H&P (Signed)
History and Physical    Louis Stone [id number] DOB: [birth year] DOA: 07/16/2020  PCP: Theotis Burrow, MD  Patient coming from: Home via EMS   Chief Complaint:  Chief Complaint  Patient presents with  . Code Stroke     HPI:   70 year old male with past medical history of hyperlipidemia, hypertension, gastroesophageal reflux disease, diabetes mellitus type 2 and oropharyngeal cancer who presents via EMS due to severe lethargy and concerns for acute stroke.  Patient is extremely lethargic at this time and unable to provide history.  41 of the history is obtained from the daughter of the patient's deceased girlfriend who visits the patient on a somewhat regular basis.  Of note, both patient and his girlfriend were mutual caretakers of each other.  His deceased girlfriend's daughters would frequently visit them as well and assist with their care.  According to the daughter via phone conversation, her mother unfortunately expired last week.  Since then, the patient has not been feeling well with poor oral intake.  Apparently, patient has still been consistently taking his insulin regimen despite his extremely poor oral intake.  Patient has been unable to comment as to whether or not patient has experienced any recent symptoms such as cough, shortness of breath, diarrhea, abdominal pain or dysuria.  This morning, patient's girlfriend's other daughter came to the home to check on him and found him extremely lethargic and slumped over in his chair.  This point, EMS was contacted.  Upon EMS prompt assessment patient was found to be hypoglycemic with initial blood sugar of 48.  There was also initial concern for possible left-sided weakness in combination with patient's continued dysarthria and therefore patient was brought into [facility] emergency department as a code stroke.   Upon patient at [facility] emergency department patient was promptly evaluated by   Dr. Erlinda Hong with neurology.  Due to the fact that a last known normal cannot be established tPA was not administered.  Interval noncontrast CT head revealed asymmetric blurring in the left frontotemporal gray-white junctions that were thought possibly secondary to artifact versus acute insult.  Neurology recommended that patient not only undergo complete stroke work-up including CT angiogram of the head and neck and MRI brain but also recommended that urgent attention be given to patient severe hypoglycemia as this could be exacerbating patient's presentation.  The hospitalist group was then called to assess the patient for admission to the hospital.   Review of Systems:   Review of Systems  Unable to perform ROS: Mental status change    Past Medical History:  Diagnosis Date  . Arthritis    per pt. I do not have md notes that indicate dx.  . Diabetes mellitus without complication ([place])   . 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 Personal history of tobacco use, presenting hazards to health 10/08/2015    Past Surgical History:  Procedure Laterality Date  . [place]   pt states ruptured disc and had surgery to repair it     reports that he quit smoking about 14 years ago. He has a 60.00 pack-year smoking history. He has never used smokeless tobacco. He reports that he does not drink alcohol and does not use drugs.  No Known Allergies  Family History  Problem Relation Age of Onset  . CVA Father   . Prostate cancer Neg Hx        not  sure what family member     Prior to Admission medications   Medication Sig Start Date End Date Taking? Authorizing Provider  famotidine (PEPCID) 40 MG tablet Take 40 mg by mouth daily.   Yes [provider]  albuterol (PROVENTIL HFA;VENTOLIN HFA) 108 (90 Base) MCG/ACT inhaler Inhale 2 puffs into the lungs.    [provider]  atorvastatin (LIPITOR) 20 MG tablet Take 20 mg by mouth daily.  06/06/20   [provider]  azithromycin (ZITHROMAX) 250 MG tablet Take 1 tablet (250 mg total) by mouth daily. 09/18/16   Hillary Bow, MD  benzonatate (TESSALON) 200 MG capsule Take 200 mg by mouth 3 (three) times daily as needed for cough.    [provider]  famotidine (PEPCID) 40 MG tablet Take 1 tablet (40 mg total) by mouth every evening. 09/05/17 09/05/18  Nance Pear, MD  lisinopril (ZESTRIL) 5 MG tablet Take 5 mg by mouth daily. 06/06/20   [provider]  meloxicam (MOBIC) 7.5 MG tablet Take 7.5 mg by mouth 2 (two) times daily. 06/06/20   [provider]  omeprazole (PRILOSEC) 40 MG capsule Take 40 mg by mouth daily. 06/06/20   [provider]  ondansetron (ZOFRAN) 4 MG tablet Take 1 tablet (4 mg total) by mouth daily as needed for nausea or vomiting. 02/09/20 02/08/21  Duffy Bruce, MD  pantoprazole (PROTONIX) 40 MG tablet Take 1 tablet (40 mg total) by mouth daily for 14 days. 02/09/20 02/23/20  Duffy Bruce, MD  sucralfate (CARAFATE) 1 g tablet Take 1 tablet (1 g total) by mouth 4 (four) times daily. 09/05/17   Nance Pear, MD  TOUJEO SOLOSTAR 300 UNIT/ML SOPN Inject 18 Units into the skin daily. 09/18/16   Hillary Bow, MD    Physical Exam: Vitals:   07/16/20 2000 07/16/20 2200 07/16/20 2215 07/16/20 2230  BP: 116/68 (!) 143/69 (!) 147/67 (!) 162/67  Pulse: 61 (!) 45 (!) 47 (!) 42  Resp: 18 17 20 17   Temp:      TempSrc:      SpO2: 91% 97% 95% 98%    Constitutional: Patient is extremely lethargic, minimally arousable.  Patient is not in any acute distress. Skin: no rashes, no lesions, notable poor skin turgor. Eyes: Pupils are equally reactive to light.  No evidence of scleral icterus or conjunctival pallor.  ENMT: Unable to assess mucous membranes and posterior oropharynx as patient is not willingly opening his mouth for me.   Neck: normal, supple, no masses, no thyromegaly.  No evidence of jugular venous distension.    Respiratory: Scattered rhonchi bilaterally.  No evidence of wheezing or rales.  Normal respiratory effort. No accessory muscle use.  Cardiovascular: Bradycardic rate with regular rhythm, no murmurs / rubs / gallops. No extremity edema. 2+ pedal pulses. No carotid bruits.  Chest:   Nontender without crepitus or deformity.   Back:   Nontender without crepitus or deformity. Abdomen: Abdomen is soft and nontender.  No evidence of intra-abdominal masses.  Positive bowel sounds noted in all quadrants.   Musculoskeletal: No joint deformity upper and lower extremities. Good ROM, no contractures. Normal muscle tone.  Neurologic: Patient is extremely lethargic and minimally arousable.  Patient is essentially nonverbal.  Patient is spontaneously moving all 4 extremities.  Patient is currently not following commands.  Patient is responsive to painful stimuli and does seem to localize to pain Psychiatric: Unable to assess due to severe lethargy.  Patient currently does not seem to possess insight as to  his current situation.  Labs on Admission: I have personally reviewed following labs and imaging studies -   CBC: Recent Labs  Lab 07/16/20 1538 07/16/20 1550  WBC 7.4  --   NEUTROABS 6.5  --   HGB 13.3 14.6  HCT 43.5 43.0  MCV 84.3  --   PLT 328  --    Basic Metabolic Panel: Recent Labs  Lab 07/16/20 1538 07/16/20 1550  NA 141 143  K 3.3* 3.2*  CL 103 104  CO2 25  --   GLUCOSE 60* 58*  BUN 33* 34*  CREATININE 1.17 1.10  CALCIUM 8.7*  --    GFR: CrCl cannot be calculated (Unknown ideal weight.). Liver Function Tests: Recent Labs  Lab 07/16/20 1538  AST 34  ALT 21  ALKPHOS 59  BILITOT 1.2  PROT 7.8  ALBUMIN 2.9*   No results for input(s): LIPASE, AMYLASE in the last 168 hours. Recent Labs  Lab 07/16/20 2127  AMMONIA 25   Coagulation Profile: Recent Labs  Lab 07/16/20 1538  INR 1.2   Cardiac Enzymes: No results for input(s): CKTOTAL, CKMB, CKMBINDEX, TROPONINI in the  last 168 hours. BNP (last 3 results) No results for input(s): PROBNP in the last 8760 hours. HbA1C: No results for input(s): HGBA1C in the last 72 hours. CBG: Recent Labs  Lab 07/16/20 1933 07/16/20 1938 07/16/20 2041 07/16/20 2202 07/16/20 2225  GLUCAP <10* 89 57* 14* 173*   Lipid Profile: No results for input(s): CHOL, HDL, LDLCALC, TRIG, CHOLHDL, LDLDIRECT in the last 72 hours. Thyroid Function Tests: Recent Labs    07/16/20 2127  TSH 2.695   Anemia Panel: Recent Labs    07/16/20 2127  VITAMINB12 144*  FOLATE 4.6*   Urine analysis:    Component Value Date/Time   COLORURINE YELLOW (A) 02/08/2020 1305   APPEARANCEUR HAZY (A) 02/08/2020 1305   LABSPEC 1.023 02/08/2020 1305   PHURINE 5.0 02/08/2020 1305   GLUCOSEU >=500 (A) 02/08/2020 1305   HGBUR NEGATIVE 02/08/2020 1305   [place] 02/08/2020 1305   KETONESUR NEGATIVE 02/08/2020 1305   PROTEINUR NEGATIVE 02/08/2020 1305   NITRITE NEGATIVE 02/08/2020 [place] 02/08/2020 1305    Radiological Exams on Admission - Personally Reviewed: CT ANGIO HEAD W OR WO CONTRAST  Result Date: 07/16/2020 CLINICAL DATA:  Code stroke follow-up EXAM: CT ANGIOGRAPHY HEAD AND NECK TECHNIQUE: Multidetector CT imaging of the head and neck was performed using the standard protocol during bolus administration of intravenous contrast. Multiplanar CT image reconstructions and MIPs were obtained to evaluate the vascular anatomy. Carotid stenosis measurements (when applicable) are obtained utilizing NASCET criteria, using the distal internal carotid diameter as the denominator. CONTRAST:  11mL OMNIPAQUE IOHEXOL 350 MG/ML SOLN COMPARISON:  None. FINDINGS: CTA NECK Aortic arch: Great vessel origins are patent. Right carotid system: There is circumferential plaque about the common carotid causing 50% stenosis. Mixed plaque is present at the ICA origin with minimal stenosis. Left carotid system: Patent. Mild noncalcified  plaque along the common carotid causing less than 50% stenosis. Mixed plaque at the ICA origin causing minimal stenosis. Vertebral arteries: Patent and is codominant. Eccentric noncalcified plaque along the distal left V1 segment causing 50% stenosis. Skeleton: Mild cervical spine degenerative changes. Other neck: Probable radiation changes in the neck; correlate with history. There is asymmetric enhancement at the left lateral aspect of the tongue (series 7, image 85). No enlarged lymph nodes. Prominent level 1 submental node is not pathologically enlarged. Upper chest: Scarring at  the lung apices. Review of the MIP images confirms the above findings CTA HEAD Anterior circulation: Intracranial internal carotid arteries are patent with calcified plaque causing mild stenosis. Anterior and middle cerebral arteries are patent. Posterior circulation: Intracranial vertebral arteries, basilar artery, and posterior cerebral arteries are patent. Venous sinuses: Patent as allowed by contrast bolus timing. Review of the MIP images confirms the above findings IMPRESSION: No large vessel occlusion. Circumferential plaque about the right common carotid causing 50% stenosis. No hemodynamically significant stenosis at the ICA origins. Noncalcified plaque along the distal left V1 vertebral artery causing 50% stenosis. Asymmetric enhancement at the left lateral aspect of the tongue (series 7, image 85). Recommend direct visual inspection in this patient with history of head/neck cancer. Nonenlarged, but prominent submental lymph node. Electronically Signed   By: Macy Mis M.D.   On: 07/16/2020 16:29   CT ANGIO NECK W OR WO CONTRAST  Result Date: 07/16/2020 CLINICAL DATA:  Code stroke follow-up EXAM: CT ANGIOGRAPHY HEAD AND NECK TECHNIQUE: Multidetector CT imaging of the head and neck was performed using the standard protocol during bolus administration of intravenous contrast. Multiplanar CT image reconstructions and MIPs  were obtained to evaluate the vascular anatomy. Carotid stenosis measurements (when applicable) are obtained utilizing NASCET criteria, using the distal internal carotid diameter as the denominator. CONTRAST:  69mL OMNIPAQUE IOHEXOL 350 MG/ML SOLN COMPARISON:  None. FINDINGS: CTA NECK Aortic arch: Great vessel origins are patent. Right carotid system: There is circumferential plaque about the common carotid causing 50% stenosis. Mixed plaque is present at the ICA origin with minimal stenosis. Left carotid system: Patent. Mild noncalcified plaque along the common carotid causing less than 50% stenosis. Mixed plaque at the ICA origin causing minimal stenosis. Vertebral arteries: Patent and is codominant. Eccentric noncalcified plaque along the distal left V1 segment causing 50% stenosis. Skeleton: Mild cervical spine degenerative changes. Other neck: Probable radiation changes in the neck; correlate with history. There is asymmetric enhancement at the left lateral aspect of the tongue (series 7, image 85). No enlarged lymph nodes. Prominent level 1 submental node is not pathologically enlarged. Upper chest: Scarring at the lung apices. Review of the MIP images confirms the above findings CTA HEAD Anterior circulation: Intracranial internal carotid arteries are patent with calcified plaque causing mild stenosis. Anterior and middle cerebral arteries are patent. Posterior circulation: Intracranial vertebral arteries, basilar artery, and posterior cerebral arteries are patent. Venous sinuses: Patent as allowed by contrast bolus timing. Review of the MIP images confirms the above findings IMPRESSION: No large vessel occlusion. Circumferential plaque about the right common carotid causing 50% stenosis. No hemodynamically significant stenosis at the ICA origins. Noncalcified plaque along the distal left V1 vertebral artery causing 50% stenosis. Asymmetric enhancement at the left lateral aspect of the tongue (series 7, image  85). Recommend direct visual inspection in this patient with history of head/neck cancer. Nonenlarged, but prominent submental lymph node. Electronically Signed   By: Macy Mis M.D.   On: 07/16/2020 16:29   CT HEAD CODE STROKE WO CONTRAST  Result Date: 07/16/2020 CLINICAL DATA:  Code stroke.  Neuro deficit, acute, stroke suspected EXAM: CT HEAD WITHOUT CONTRAST TECHNIQUE: Contiguous axial images were obtained from the base of the skull through the vertex without intravenous contrast. COMPARISON:  None. FINDINGS: Brain: No intracranial hemorrhage. Asymmetric blurring of the gray-white junctions involving the left frontotemporal region. No mass lesion. No midline shift, ventriculomegaly or extra-axial fluid collection. Vascular: No hyperdense vessel or unexpected calcification. Bilateral skull base atherosclerotic  calcifications. Skull: Negative for fracture or focal lesion. Sinuses/Orbits: Normal orbits. Right maxillary sinus secretions. No mastoid effusion. Other: None. ASPECTS [facility] Stroke Program Early CT Score) - Ganglionic level infarction (caudate, lentiform nuclei, internal capsule, insula, M1-M3 cortex): 6 - Supraganglionic infarction (M4-M6 cortex): 2 Total score (0-10 with 10 being normal): 8 IMPRESSION: 1. Asymmetric blurring of the left frontotemporal gray-white junctions may reflect artifact versus acute insult. 2. ASPECTS is 8 3. Code stroke imaging results were communicated on 07/16/2020 at 3:49 pm to provider Dr. Erlinda Hong Via secure text paging. Electronically Signed   By: Primitivo Gauze M.D.   On: 07/16/2020 16:00    EKG: Personally reviewed.  Rhythm is normal sinus rhythm with heart rate of 78 bpm.  Evidence of left-ventricular hypertrophy.     Assessment/Plan Principal Problem:   Acute metabolic encephalopathy   Patient presenting with extreme lethargy and minimal responsiveness in the setting of several days of not feeling well with poor oral intake and continued use of daily  insulin  Profound encephalopathy this point is likely secondary to severe hypoglycemia  Patient has continued to exhibit bouts of severe hypoglycemia despite D5 infusion and several doses of D50.  We will transition patient to D10 infusion for now and admit to progressive unit  Close clinical  monitoring with every hour Accu-Cheks  Completing stroke work-up per neurology recommendations to ensure that there is no underlying acute stroke  Also evaluating patient for other potential causes of encephalopathy with urine toxicology screen, urinalysis, TSH, vitamin B12, folate, chest x-ray  Patient will remain n.p.o. as patient is at extremely high aspiration risk at this point  Monitoring for clinical improvement  Active Problems:   Acute left-sided weakness   EMS had questioned left-sided weakness on their initial assessment.  Initial noncontrast CT of the head did reveal possible asymmetric blurring of the left frontotemporal gray-white junctions  Patient is already been evaluated by Dr. Erlinda Hong neurology who is recommending completion of stroke work-up  Provided patient with daily antiplatelet therapy with aspirin for now  MRI brain  Serial neurologic checks  PT, OT and SLP evaluations  Echocardiogram in the morning  Permissive hypertension for now until stroke is ruled out  Hemoglobin A1c and lipid panel in the morning    Uncontrolled type 2 diabetes mellitus with hypoglycemia without coma, with long-term current use of insulin ([place])   Patient presenting with profound hypoglycemia secondary to ongoing insulin use despite extremely poor oral intake as of late  Accu-Cheks every hour while patient is on D10 infusion.  Once patient is transitioned off of D10 patient can be transition to every 2 hours Accu-Cheks    COVID-19 virus infection   Patient incidentally found to have COVID-19 based on PCR testing  Patient is currently not hypoxic, however per my discussion with  Verdene Lennert (deceased girlfriends daughter) she was recently positive for COVID  Supportive care for now  If patient develops hypoxia will readdress treatment plan  Airborne and contact isolation    Bradycardia   Patient exhibiting significant bradycardia here in the emergency department with heart rates in the mid 40s  Patient is hemodynamically stable  It is my opinion that patient's encephalopathy is not secondary to the bradycardia  Severe hypoglycemia is likely exacerbating bradycardia  Additionally attempting to replace electrolyte abnormalities including hypokalemia with intravenous potassium chloride  Mixed diabetic hyperlipidemia associated with type 2 diabetes mellitus ([place])  Holding statin therapy until patient is able to safely take oral intake  GERD without esophagitis    Placing patient on intravenous PPI for now.   Code Status:  Full code Family Communication: Patient does not have a power of attorney or guardian.  Case has been discussed with Verdene Lennert, patient's recently deceased girlfriend's daughter who has intermittently been the patient's caretaker.  She has been updated on plan of care.  Status is: Inpatient  Remains inpatient appropriate because:Ongoing diagnostic testing needed not appropriate for outpatient work up, IV treatments appropriate due to intensity of illness or inability to take PO and Inpatient level of care appropriate due to severity of illness   Dispo: The patient is from: Home              Anticipated d/c is to: Home              Anticipated d/c date is: > 3 days              Patient currently is not medically stable to d/c.        Vernelle Emerald MD [facility] Pager [phone]  If 7PM-7AM, please contact night-coverage [email or web address] Use universal [place] password for that web site. If you do not have the password, please call the hospital operator.  07/16/2020, 11:24 PM

## 2020-07-16 NOTE — Code Documentation (Signed)
Stroke Response Nurse Documentation Code Documentation  Louis Stone is a 70 y.o. male arriving to [place] ED via [place] [facility] on 07/16/2020 with past medical hx of insulin dependant diabetes. Code stroke was activated by EMS. Patient from home where his LKW was unknown. Pt lives by himself and sees family often. He is living in his gilrfriend's house alone. The daughter of the grilfroend came over and noted he was acting abnormal with slurred speech. She could not state when he was last known well. The patient reports that he woke up in the floor this morning around 1000. He struggled, but he put himself back in bed. He had a abrasion to his left forehead and reports hitting a cabinet, but "it didn't hurt." EMS called and noted slurred speech. Code Stroke activated.   CBG noted to be 48 and given dextrose with no resolution of slurred speech during transport. Upon arrival to the ED, CBG 58 and patient NIHSS 1 due to dysarthria.  Stroke team at the bedside on patient arrival. Labs drawn and patient cleared for CT by Dr. Vanita Panda. Patient to CT with team. See documentation for details and code stroke times. Patient with dysarthria  on exam. The following imaging was completed:  CT, CTA head and neck. Patient is not a candidate for tPA due to outside window. Care/Plan: q2 mNIHSS/VS. Bedside handoff with ED RN Daija.    Kathrin Greathouse  Stroke Response RN

## 2020-07-16 NOTE — ED Notes (Signed)
Provider at bedside

## 2020-07-16 NOTE — ED Notes (Signed)
Provider aware of patients HR being in the 40s

## 2020-07-17 ENCOUNTER — Inpatient Hospital Stay (HOSPITAL_COMMUNITY): Payer: Medicare Other

## 2020-07-17 DIAGNOSIS — U071 COVID-19: Secondary | ICD-10-CM | POA: Diagnosis not present

## 2020-07-17 DIAGNOSIS — Z794 Long term (current) use of insulin: Secondary | ICD-10-CM

## 2020-07-17 DIAGNOSIS — E1169 Type 2 diabetes mellitus with other specified complication: Secondary | ICD-10-CM

## 2020-07-17 DIAGNOSIS — I6389 Other cerebral infarction: Secondary | ICD-10-CM | POA: Diagnosis not present

## 2020-07-17 DIAGNOSIS — E782 Mixed hyperlipidemia: Secondary | ICD-10-CM

## 2020-07-17 DIAGNOSIS — G9341 Metabolic encephalopathy: Secondary | ICD-10-CM | POA: Diagnosis not present

## 2020-07-17 DIAGNOSIS — E11649 Type 2 diabetes mellitus with hypoglycemia without coma: Secondary | ICD-10-CM | POA: Diagnosis not present

## 2020-07-17 LAB — LIPID PANEL
Cholesterol: 100 mg/dL (ref 0–200)
HDL: 26 mg/dL — ABNORMAL LOW (ref >40)
LDL Cholesterol: 63 mg/dL (ref 0–99)
Total CHOL/HDL Ratio: 3.8 RATIO
Triglycerides: 57 mg/dL (ref <150)
VLDL: 11 mg/dL (ref 0–40)

## 2020-07-17 LAB — URINALYSIS, COMPLETE (UACMP) WITH MICROSCOPIC
Bilirubin Urine: NEGATIVE
Glucose, UA: 150 mg/dL — AB
Ketones, ur: NEGATIVE mg/dL
Leukocytes,Ua: NEGATIVE
Nitrite: NEGATIVE
Protein, ur: 30 mg/dL — AB
Specific Gravity, Urine: >1.046 — ABNORMAL HIGH (ref 1.005–1.030)
pH: 5 (ref 5.0–8.0)

## 2020-07-17 LAB — RAPID URINE DRUG SCREEN, HOSP PERFORMED
Amphetamines: NOT DETECTED
Barbiturates: NOT DETECTED
Benzodiazepines: NOT DETECTED
Cocaine: NOT DETECTED
Opiates: NOT DETECTED
Tetrahydrocannabinol: NOT DETECTED

## 2020-07-17 LAB — CBG MONITORING, ED
Glucose-Capillary: 157 mg/dL — ABNORMAL HIGH (ref 70–99)
Glucose-Capillary: 58 mg/dL — ABNORMAL LOW (ref 70–99)
Glucose-Capillary: 58 mg/dL — ABNORMAL LOW (ref 70–99)
Glucose-Capillary: 71 mg/dL (ref 70–99)
Glucose-Capillary: 71 mg/dL (ref 70–99)
Glucose-Capillary: 75 mg/dL (ref 70–99)
Glucose-Capillary: 75 mg/dL (ref 70–99)
Glucose-Capillary: 76 mg/dL (ref 70–99)
Glucose-Capillary: 79 mg/dL (ref 70–99)
Glucose-Capillary: 82 mg/dL (ref 70–99)
Glucose-Capillary: 83 mg/dL (ref 70–99)
Glucose-Capillary: 85 mg/dL (ref 70–99)
Glucose-Capillary: <10 mg/dL — CL (ref 70–99)

## 2020-07-17 LAB — GLUCOSE, CAPILLARY: Glucose-Capillary: 128 mg/dL — ABNORMAL HIGH (ref 70–99)

## 2020-07-17 LAB — I-STAT VENOUS BLOOD GAS, ED
Acid-Base Excess: 6 mmol/L — ABNORMAL HIGH (ref 0.0–2.0)
Bicarbonate: 33.4 mmol/L — ABNORMAL HIGH (ref 20.0–28.0)
Calcium, Ion: 1.2 mmol/L (ref 1.15–1.40)
HCT: 40 % (ref 39.0–52.0)
Hemoglobin: 13.6 g/dL (ref 13.0–17.0)
O2 Saturation: 99 %
Potassium: 3.4 mmol/L — ABNORMAL LOW (ref 3.5–5.1)
Sodium: 144 mmol/L (ref 135–145)
TCO2: 35 mmol/L — ABNORMAL HIGH (ref 22–32)
pCO2, Ven: 58.7 mmHg (ref 44.0–60.0)
pH, Ven: 7.363 (ref 7.250–7.430)
pO2, Ven: 178 mmHg — ABNORMAL HIGH (ref 32.0–45.0)

## 2020-07-17 LAB — ECHOCARDIOGRAM LIMITED
Area-P 1/2: 2.6 cm2
S' Lateral: 2.9 cm
Single Plane A4C EF: 43.9 %

## 2020-07-17 LAB — BASIC METABOLIC PANEL
Anion gap: 13 (ref 5–15)
BUN: 25 mg/dL — ABNORMAL HIGH (ref 8–23)
CO2: 24 mmol/L (ref 22–32)
Calcium: 8.6 mg/dL — ABNORMAL LOW (ref 8.9–10.3)
Chloride: 104 mmol/L (ref 98–111)
Creatinine, Ser: 0.85 mg/dL (ref 0.61–1.24)
GFR, Estimated: >60 mL/min (ref >60)
Glucose, Bld: 67 mg/dL — ABNORMAL LOW (ref 70–99)
Potassium: 3.4 mmol/L — ABNORMAL LOW (ref 3.5–5.1)
Sodium: 141 mmol/L (ref 135–145)

## 2020-07-17 LAB — MAGNESIUM: Magnesium: 2.3 mg/dL (ref 1.7–2.4)

## 2020-07-17 LAB — HIV ANTIBODY (ROUTINE TESTING W REFLEX): HIV Screen 4th Generation wRfx: NONREACTIVE

## 2020-07-17 LAB — HEMOGLOBIN A1C
Hgb A1c MFr Bld: 6.3 % — ABNORMAL HIGH (ref 4.8–5.6)
Mean Plasma Glucose: 134.11 mg/dL

## 2020-07-17 MED ORDER — ALBUTEROL SULFATE HFA 108 (90 BASE) MCG/ACT IN AERS
2.0000 | INHALATION_SPRAY | RESPIRATORY_TRACT | Status: DC | PRN
  Filled 2020-07-17: qty 6.7

## 2020-07-17 MED ORDER — HYDROCOD POLST-CPM POLST ER 10-8 MG/5ML PO SUER
5.0000 mL | Freq: Two times a day (BID) | ORAL | Status: DC | PRN
Start: 2020-07-17 — End: 2020-07-21
  Administered 2020-07-20: 5 mL via ORAL
  Filled 2020-07-17: qty 5

## 2020-07-17 MED ORDER — SODIUM CHLORIDE 0.9 % IV SOLN
100.0000 mg | Freq: Every day | INTRAVENOUS | Status: DC
  Administered 2020-07-18: 100 mg via INTRAVENOUS
  Filled 2020-07-17: qty 20

## 2020-07-17 MED ORDER — ATORVASTATIN CALCIUM 10 MG PO TABS
20.0000 mg | ORAL_TABLET | Freq: Every day | ORAL | Status: DC
  Administered 2020-07-17 – 2020-07-20 (×4): 20 mg via ORAL
  Filled 2020-07-17 (×4): qty 2

## 2020-07-17 MED ORDER — GUAIFENESIN-DM 100-10 MG/5ML PO SYRP
10.0000 mL | ORAL_SOLUTION | ORAL | Status: DC | PRN
  Administered 2020-07-18: 10 mL via ORAL
  Filled 2020-07-17: qty 10

## 2020-07-17 MED ORDER — ASCORBIC ACID 500 MG PO TABS
500.0000 mg | ORAL_TABLET | Freq: Every day | ORAL | Status: DC
  Administered 2020-07-18 – 2020-07-21 (×4): 500 mg via ORAL
  Filled 2020-07-17 (×4): qty 1

## 2020-07-17 MED ORDER — ZINC SULFATE 220 (50 ZN) MG PO CAPS
220.0000 mg | ORAL_CAPSULE | Freq: Every day | ORAL | Status: DC
  Administered 2020-07-18 – 2020-07-21 (×4): 220 mg via ORAL
  Filled 2020-07-17 (×5): qty 1

## 2020-07-17 MED ORDER — SODIUM CHLORIDE 0.9 % IV SOLN
200.0000 mg | Freq: Once | INTRAVENOUS | Status: AC
  Administered 2020-07-17: 200 mg via INTRAVENOUS
  Filled 2020-07-17: qty 40

## 2020-07-17 MED ORDER — ASPIRIN EC 81 MG PO TBEC
81.0000 mg | DELAYED_RELEASE_TABLET | Freq: Every day | ORAL | Status: DC
  Administered 2020-07-18 – 2020-07-21 (×4): 81 mg via ORAL
  Filled 2020-07-17 (×5): qty 1

## 2020-07-17 NOTE — ED Notes (Signed)
Given dinner tray resting in bed  On hospital bed

## 2020-07-17 NOTE — ED Notes (Signed)
Lunch Tray Ordered @ 1022. 

## 2020-07-17 NOTE — ED Notes (Signed)
Provider notified of patients CBG being 58. Will continue to monitor the patient at this time

## 2020-07-17 NOTE — Evaluation (Signed)
Physical Therapy Evaluation Patient Details Name: Louis Stone MRN: [id number] DOB: 06/29/1951 Today's Date: 07/17/2020   History of Present Illness  Pt is a 70 y/o male admitted secondary to weakness and decreased responsiveness. Thought to be secondary to acute metabolic encephalopathy and hypoglycemia. Pt also found to be COVID +. PMH includes DM, HTN, GERD, oropharyngeal cancer.  Clinical Impression  Pt admitted secondary to problem above with deficits below. Pt requiring min to min guard A to take steps at EOB. Pt with decreased safety awareness, and disoriented to time. Pt lives alone and has been unable to care for himself. Feel pt would benefit from SNF at d/c, however, pt likely to refuse. If pt refuses, will require max HH services. Will continue to follow acutely.     Follow Up Recommendations SNF;Home health PT;Supervision/Assistance - 24 hour (Pt likely to refuse SNF)    Equipment Recommendations  Rolling walker with 5" wheels    Recommendations for Other Services       Precautions / Restrictions Precautions Precautions: Fall Restrictions Weight Bearing Restrictions: No      Mobility  Bed Mobility Overal bed mobility: Needs Assistance Bed Mobility: Supine to Sit;Sit to Supine     Supine to sit: Min assist Sit to supine: Min assist   General bed mobility comments: Min A for sequencing and LE assist. Increased time required.    Transfers Overall transfer level: Needs assistance Equipment used: 1 person hand held assist Transfers: Sit to/from Stand Sit to Stand: Min guard         General transfer comment: Min guard A for safety.  Ambulation/Gait Ambulation/Gait assistance: Min guard   Assistive device: 1 person hand held assist   Gait velocity: Decreased   General Gait Details: Able to take side steps at EOB with min guard A. Distance limited as IV pole connected to stretcher in ED.  Stairs            Wheelchair Mobility    Modified  Rankin (Stroke Patients Only)       Balance Overall balance assessment: Needs assistance Sitting-balance support: Feet supported Sitting balance-Leahy Scale: Fair     Standing balance support: Single extremity supported;During functional activity Standing balance-Leahy Scale: Poor Standing balance comment: reliant on at least 1 UE support                             Pertinent Vitals/Pain Pain Assessment: No/denies pain    Home Living Family/patient expects to be discharged to:: Private residence Living Arrangements: Alone Available Help at Discharge: Family;Available PRN/intermittently Type of Home: House Home Access: Ramped entrance     Home Layout: One level Home Equipment: None Additional Comments: Pt's girlfriend recently passed, but her daughters have been checking on pt.    Prior Function Level of Independence: Independent               Hand Dominance        Extremity/Trunk Assessment   Upper Extremity Assessment Upper Extremity Assessment: Defer to OT evaluation    Lower Extremity Assessment Lower Extremity Assessment: Generalized weakness       Communication   Communication: HOH  Cognition Arousal/Alertness: Awake/alert Behavior During Therapy: WFL for tasks assessed/performed Overall Cognitive Status: No family/caregiver present to determine baseline cognitive functioning  General Comments: Decreased safety awareness, as pt down at end of stretcher sitting up upon entry. Pt unsure of month, but could recall the year.      General Comments General comments (skin integrity, edema, etc.): During mobility, oxygen sats at 85-86% on RA. Notified RN and RN requesting to place pt on oxygen. Placed on 2.5L and oxygen sats at 90%; notified RN.    Exercises     Assessment/Plan    PT Assessment Patient needs continued PT services  PT Problem List Decreased strength;Decreased  balance;Decreased mobility;Decreased activity tolerance;Decreased knowledge of use of DME;Decreased knowledge of precautions       PT Treatment Interventions Gait training;DME instruction;Functional mobility training;Therapeutic activities;Therapeutic exercise;Balance training;Stair training;Patient/family education    PT Goals (Current goals can be found in the Care Plan section)  Acute Rehab PT Goals Patient Stated Goal: to go home PT Goal Formulation: With patient Time For Goal Achievement: 07/31/20 Potential to Achieve Goals: Good    Frequency Min 3X/week   Barriers to discharge        Co-evaluation               AM-PAC PT "6 Clicks" Mobility  Outcome Measure Help needed turning from your back to your side while in a flat bed without using bedrails?: A Little Help needed moving from lying on your back to sitting on the side of a flat bed without using bedrails?: A Little Help needed moving to and from a bed to a chair (including a wheelchair)?: A Little Help needed standing up from a chair using your arms (e.g., wheelchair or bedside chair)?: A Little Help needed to walk in hospital room?: A Little Help needed climbing 3-5 steps with a railing? : A Little 6 Click Score: 18    End of Session Equipment Utilized During Treatment: Oxygen Activity Tolerance: Patient tolerated treatment well Patient left: in bed;with call bell/phone within reach (on stretcher in ED) Nurse Communication: Mobility status;Other (comment) (pt oxygen sats) PT Visit Diagnosis: Other abnormalities of gait and mobility (R26.89);Difficulty in walking, not elsewhere classified (R26.2)    Time: 4196-2229 PT Time Calculation (min) (ACUTE ONLY): 20 min   Charges:   PT Evaluation $PT Eval Moderate Complexity: 1 Mod          Reuel Derby, PT, DPT  Acute Rehabilitation Services  Pager: [phone] Office: [phone]   Rudean Hitt 07/17/2020, 12:00 PM

## 2020-07-17 NOTE — Progress Notes (Addendum)
STROKE TEAM PROGRESS NOTE   INTERVAL HISTORY Blood glucose continued to be low overnight with 58 reading at 0200.  Per ED RN having difficulty swallowing medications. She activated SLP eval per protocol.  Patient is conversant but not appropriate at times and does not stay on topic. He is a poor historian.  Patient denies any new issues currently. He feels he is speaking and swallowing at his baseline. 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 He states he was eating a lot of chicken noodle soup before admission.  He denies any weakness or difficulty with coordination in limbs today.    Vitals:   07/17/20 0500 07/17/20 0515 07/17/20 0600 07/17/20 0815  BP: (!) 150/78 (!) 155/82 (!) 168/89 (!) 148/76  Pulse: (!) 50 (!) 50 (!) 50 (!) 48  Resp: 18 20 16 18   Temp:      TempSrc:      SpO2: 96% 96% 96% 98%   CBC:  Recent Labs  Lab 07/16/20 1538 07/16/20 1550 07/17/20 0038  WBC 7.4  --   --   NEUTROABS 6.5  --   --   HGB 13.3 14.6 13.6  HCT 43.5 43.0 40.0  MCV 84.3  --   --   PLT 328  --   --    Basic Metabolic Panel:  Recent Labs  Lab 07/16/20 1538 07/16/20 1550 07/17/20 0038 07/17/20 0509  NA 141 143 144 141  K 3.3* 3.2* 3.4* 3.4*  CL 103 104  --  104  CO2 25  --   --  24  GLUCOSE 60* 58*  --  67*  BUN 33* 34*  --  25*  CREATININE 1.17 1.10  --  0.85  CALCIUM 8.7*  --   --  8.6*  MG  --   --   --  2.3   Lipid Panel:  Recent Labs  Lab 07/17/20 0509  CHOL 100  TRIG 57  HDL 26*  CHOLHDL 3.8  VLDL 11  LDLCALC 63   HgbA1c:  Recent Labs  Lab 07/17/20 0509  HGBA1C 6.3*   Urine Drug Screen:  Recent Labs  Lab 07/17/20 0218  LABOPIA NONE DETECTED  COCAINSCRNUR NONE DETECTED  LABBENZ NONE DETECTED  AMPHETMU NONE DETECTED  THCU NONE DETECTED  LABBARB NONE DETECTED    Alcohol Level No results for input(s): ETH in the last 168 hours.  IMAGING past 24 hours CT ANGIO HEAD W OR WO CONTRAST  Result  Date: 07/16/2020 CLINICAL DATA:  Code stroke follow-up EXAM: CT ANGIOGRAPHY HEAD AND NECK TECHNIQUE: Multidetector CT imaging of the head and neck was performed using the standard protocol during bolus administration of intravenous contrast. Multiplanar CT image reconstructions and MIPs were obtained to evaluate the vascular anatomy. Carotid stenosis measurements (when applicable) are obtained utilizing NASCET criteria, using the distal internal carotid diameter as the denominator. CONTRAST:  67mL OMNIPAQUE IOHEXOL 350 MG/ML SOLN COMPARISON:  None. FINDINGS: CTA NECK Aortic arch: Great vessel origins are patent. Right carotid system: There is circumferential plaque about the common carotid causing 50% stenosis. Mixed plaque is present at the ICA origin with minimal stenosis. Left carotid system: Patent. Mild noncalcified plaque along the common carotid causing less than 50% stenosis. Mixed plaque at the ICA origin causing minimal stenosis. Vertebral arteries: Patent and is codominant. Eccentric noncalcified plaque along the distal left V1 segment causing 50% stenosis. Skeleton: Mild cervical spine degenerative changes. Other neck: Probable radiation changes in  the neck; correlate with history. There is asymmetric enhancement at the left lateral aspect of the tongue (series 7, image 85). No enlarged lymph nodes. Prominent level 1 submental node is not pathologically enlarged. Upper chest: Scarring at the lung apices. Review of the MIP images confirms the above findings CTA HEAD Anterior circulation: Intracranial internal carotid arteries are patent with calcified plaque causing mild stenosis. Anterior and middle cerebral arteries are patent. Posterior circulation: Intracranial vertebral arteries, basilar artery, and posterior cerebral arteries are patent. Venous sinuses: Patent as allowed by contrast bolus timing. Review of the MIP images confirms the above findings IMPRESSION: No large vessel occlusion.  Circumferential plaque about the right common carotid causing 50% stenosis. No hemodynamically significant stenosis at the ICA origins. Noncalcified plaque along the distal left V1 vertebral artery causing 50% stenosis. Asymmetric enhancement at the left lateral aspect of the tongue (series 7, image 85). Recommend direct visual inspection in this patient with history of head/neck cancer. Nonenlarged, but prominent submental lymph node. Electronically Signed   By: Macy Mis M.D.   On: 07/16/2020 16:29   DG Chest 1 View  Result Date: 07/17/2020 CLINICAL DATA:  Hypoxia, COVID-19, code stroke EXAM: CHEST  1 VIEW COMPARISON:  Radiograph 02/09/2020, CT 10/08/2015 FINDINGS: Heterogeneous areas of coarse interstitial and patchy opacity throughout both lungs. No pneumothorax. No effusion. Pulmonary vascularity is normally distributed. The aorta is calcified. The remaining cardiomediastinal contours are unremarkable. No acute osseous or soft tissue abnormality. Degenerative changes are present in the imaged spine and shoulders. Telemetry leads overlie the chest. IMPRESSION: Heterogeneous areas of coarse interstitial and patchy opacity throughout both lungs, compatible with a multifocal infection in the setting of COVID-19. Electronically Signed   By: Lovena Le M.D.   On: 07/17/2020 00:33   CT ANGIO NECK W OR WO CONTRAST  Result Date: 07/16/2020 CLINICAL DATA:  Code stroke follow-up EXAM: CT ANGIOGRAPHY HEAD AND NECK TECHNIQUE: Multidetector CT imaging of the head and neck was performed using the standard protocol during bolus administration of intravenous contrast. Multiplanar CT image reconstructions and MIPs were obtained to evaluate the vascular anatomy. Carotid stenosis measurements (when applicable) are obtained utilizing NASCET criteria, using the distal internal carotid diameter as the denominator. CONTRAST:  3mL OMNIPAQUE IOHEXOL 350 MG/ML SOLN COMPARISON:  None. FINDINGS: CTA NECK Aortic arch: Great  vessel origins are patent. Right carotid system: There is circumferential plaque about the common carotid causing 50% stenosis. Mixed plaque is present at the ICA origin with minimal stenosis. Left carotid system: Patent. Mild noncalcified plaque along the common carotid causing less than 50% stenosis. Mixed plaque at the ICA origin causing minimal stenosis. Vertebral arteries: Patent and is codominant. Eccentric noncalcified plaque along the distal left V1 segment causing 50% stenosis. Skeleton: Mild cervical spine degenerative changes. Other neck: Probable radiation changes in the neck; correlate with history. There is asymmetric enhancement at the left lateral aspect of the tongue (series 7, image 85). No enlarged lymph nodes. Prominent level 1 submental node is not pathologically enlarged. Upper chest: Scarring at the lung apices. Review of the MIP images confirms the above findings CTA HEAD Anterior circulation: Intracranial internal carotid arteries are patent with calcified plaque causing mild stenosis. Anterior and middle cerebral arteries are patent. Posterior circulation: Intracranial vertebral arteries, basilar artery, and posterior cerebral arteries are patent. Venous sinuses: Patent as allowed by contrast bolus timing. Review of the MIP images confirms the above findings IMPRESSION: No large vessel occlusion. Circumferential plaque about the right common carotid causing  50% stenosis. No hemodynamically significant stenosis at the ICA origins. Noncalcified plaque along the distal left V1 vertebral artery causing 50% stenosis. Asymmetric enhancement at the left lateral aspect of the tongue (series 7, image 85). Recommend direct visual inspection in this patient with history of head/neck cancer. Nonenlarged, but prominent submental lymph node. Electronically Signed   By: Macy Mis M.D.   On: 07/16/2020 16:29   MR BRAIN WO CONTRAST  Result Date: 07/16/2020 CLINICAL DATA:  Transient ischemic attack.   Slurred speech. EXAM: MRI HEAD WITHOUT CONTRAST TECHNIQUE: Multiplanar, multiecho pulse sequences of the brain and surrounding structures were obtained without intravenous contrast. COMPARISON:  None. FINDINGS: Brain: No acute infarct, mass effect or extra-axial collection. Single chronic microhemorrhage in the left parietal lobe. There is multifocal hyperintense T2-weighted signal within the white matter. Generalized volume loss without a clear lobar predilection. The midline structures are normal. Vascular: Major flow voids are preserved. Skull and upper cervical spine: Normal calvarium and skull base. Visualized upper cervical spine and soft tissues are normal. Sinuses/Orbits:Trace fluid in the right maxillary sinus. Normal orbits. IMPRESSION: 1. No acute intracranial abnormality. 2. Findings of chronic small vessel ischemia and generalized volume loss. Electronically Signed   By: Ulyses Jarred M.D.   On: 07/16/2020 23:26   CT HEAD CODE STROKE WO CONTRAST  Result Date: 07/16/2020 CLINICAL DATA:  Code stroke.  Neuro deficit, acute, stroke suspected EXAM: CT HEAD WITHOUT CONTRAST TECHNIQUE: Contiguous axial images were obtained from the base of the skull through the vertex without intravenous contrast. COMPARISON:  None. FINDINGS: Brain: No intracranial hemorrhage. Asymmetric blurring of the gray-white junctions involving the left frontotemporal region. No mass lesion. No midline shift, ventriculomegaly or extra-axial fluid collection. Vascular: No hyperdense vessel or unexpected calcification. Bilateral skull base atherosclerotic calcifications. Skull: Negative for fracture or focal lesion. Sinuses/Orbits: Normal orbits. Right maxillary sinus secretions. No mastoid effusion. Other: None. ASPECTS [facility] Stroke Program Early CT Score) - Ganglionic level infarction (caudate, lentiform nuclei, internal capsule, insula, M1-M3 cortex): 6 - Supraganglionic infarction (M4-M6 cortex): 2 Total score (0-10 with 10  being normal): 8 IMPRESSION: 1. Asymmetric blurring of the left frontotemporal gray-white junctions may reflect artifact versus acute insult. 2. ASPECTS is 8 3. Code stroke imaging results were communicated on 07/16/2020 at 3:49 pm to provider Dr. Erlinda Hong Via secure text paging. Electronically Signed   By: Primitivo Gauze M.D.   On: 07/16/2020 16:00   Echo 07/17/2020 IMPRESSIONS  1. Left ventricular ejection fraction, by estimation, is 50 to 55%. The  left ventricle has low normal function. There is moderate asymmetric left  ventricular hypertrophy of the basal-septal segment. Left ventricular  diastolic parameters are consistent  with Grade I diastolic dysfunction (impaired relaxation).  2. Right ventricular systolic function is normal. The right ventricular  size is normal.  3. The mitral valve is normal in structure. No evidence of mitral valve  regurgitation.  4. The aortic valve was not well visualized. Aortic valve regurgitation  is not visualized. No aortic stenosis is present.  5. The inferior vena cava is normal in size with greater than 50%  respiratory variability, suggesting right atrial pressure of 3 mmHg.   PHYSICAL EXAM General:  70 y.o. male sitting up in bed in NAD No increased work of breathing.   Mental Status: Awake, alert, oriented to place with multiple choice provided, oriented to date, year, president and self.  Conversant. Requires cueing to stay on topic and answer questions.   Cranial Nerves: II: Visual Fields  are full. Pupils are equal, round, and reactive to light 67mm/brisk.  III,IV, VI: EOMI. No ptosis or diplopia noted.  V: Facial sensation is symmetric to light touch.  VII: Facial movement is symmetric resting and smiling.   VIII: Hearing is intact to voice X: Phonation intact. XI: Shoulder shrug is symmetric. XII: Tongue protrudes midline  Motor: All extremities with antigravity movement without pronator drift. Upper and lower extremities 5/5  and equal bilaterally. Tone is normal. Bulk is normal.  Sensory: Sensation is symmetric to light touch in the arms and legs. Cerebellar: FNF and HKS are intact bilaterally. Gait: Deferred  ASSESSMENT/PLAN Mr. Iven Earnhart is a 70 y.o. male with history of hyperlipidemia, hypertension, gastroesophageal reflux disease, diabetes mellitus type 2 and oropharyngeal cancer who presented with generalized weakness, dysarthria and LUE weakness found to have severe hypoglycemia. He was incidentally found to be positive for COVID 19 on recent PCR testing with sick contact confirmed (girlfriend's daughter). There is  concern for PNA on XR chest. No hypoxia.   Stroke like symptoms likely due to hypoglycemia. DDx including TIA  CT head No acute abnormality.    CTA head & neck: No large vessel occlusion. Right mid CCA 50% and left V1 50% stenosis  MRI Brain: No acute intracranial abnormality. Chronic small vessel ischemia and generalized volume loss.  2D Echo: 50-55% EF, The interatrial septum was not well visualized.   LDL 63  HgbA1c 6.3  VTE prophylaxis - Lovenox 40 mg daily   No antiplatelet or anticoagulant therapy prior to admission, now on ASA 81mg  for stroke prevention.  Therapy recommendations:  Pending  Disposition:  Pending   Neurology Stroke willl sign off.   Diabetes type II Controlled hypoglycemia  Home meds:  Toujeo solostar 18 units into the skin daily  HgbA1c 6.3, goal < 7.0  Several hypoglycemia readings  S/p D50 and now on D10  CBGs   SSI  Close PCP follow up for better DM control  Hypertension  Home meds:    Stable . Long-term BP goal normotensive  Hyperlipidemia  Home meds: resumed in hospital  LDL 63, goal < 70  Continue statin at discharge  COVID infection  COVID positive this admission  On remdesivir   oxygenating well on RA.  CXR - Heterogeneous areas of coarse interstitial and patchy opacity throughout both lungs, compatible  with a multifocal infection in the setting of COVID-19.  Other Stroke Risk Factors Advanced Age >/= 65   60 pack year cigarette smoking history. Quit 14 years ago.   Substance abuse - UDS:  THC NONE DETECTED, Cocaine NONE DETECTED.   Other Active Problems  Hypokalemia - K 3.4 - supplement  Hospital day # 1  Bailey-Modzik, Delila A, NP  ATTENDING NOTE: I reviewed above note and agree with the assessment and plan. Pt was seen and examined.   71 year old male with history of hypertension, hyperlipidemia, diabetes presented as a code stroke due to slurred speech and left arm weakness.  On arrival weakness resolved.  Patient stated that he is speech at baseline.  Glucose was low at EMS arrival and ED arrival, received D50 and now on D10.  Currently passed a swallow, on diet.  However, glucose level still 70 to 80s. Stroke work-up negative MRI, plain CT.  CT head and neck showed right mid CCA and left V1 50% stenosis.  LDL 63 and A1c 6.3, EF 50 to 55%.  Strokelike symptoms likely due to hypoglycemia.  Put on aspirin 81 and continue  statin on discharge.  Patient found to have a COVID infection with COVID positive.  CXR consistent with COVID pneumonia.  On remdesivir.   Neurology will sign off. Please call with questions. Thanks for the consult.   Rosalin Hawking, MD PhD Stroke Neurology 07/17/2020 5:38 PM   To contact Stroke Continuity provider, please refer to [email or web address] After hours, contact General Neurology

## 2020-07-17 NOTE — Progress Notes (Signed)
  Echocardiogram 2D Echocardiogram has been performed.  Louis Stone 07/17/2020, 2:30 PM

## 2020-07-17 NOTE — ED Notes (Signed)
Attempted to call report on patient. Unable to give report at this time.  

## 2020-07-17 NOTE — ED Notes (Signed)
Dinner Tray Ordered @ 1733. 

## 2020-07-17 NOTE — Progress Notes (Signed)
PROGRESS NOTE    Louis Stone  MWN:[id number] DOB: March 13, 1951 DOA: 07/16/2020 PCP: Theotis Burrow, MD    Brief Narrative:  Louis Stone is a 70 year old male with past medical history significant for hyperlipidemia, essential hypertension, GERD, type 2 diabetes mellitus, and oropharyngeal cancer who presented to the ED via EMS due to confusion, lethargy.  Patient was found by family slumped over in his chair.  Upon EMS arrival, patient was noted to be hypoglycemic with a blood sugar of 48.  There was also concern of possible left-sided weakness with dysarthria and patient was transported to [facility] ED for concern of acute stroke.  Patient was living with his girlfriend, in which she passed away last week.  And per family's report, since he has been not feeling well with poor oral intake.  In the ED, temperature 97.6, HR 77, RR 19, BP 155/82, SPO2 97% on room air.  Sodium 141, potassium 3.3, chloride 103, CO2 25, glucose 60, BUN 33, creatinine 1.17, albumin 2.9, AST 34, ALT 21, total bilirubin 1.2.  WBC 7.4 hemoglobin 13.3, platelets 328.  SARS-CoV-2/COVID-19 PCR positive.  CT head without contrast with asymmetric blurring left frontotemporal gray-white junctions which may reflect artifact versus acute insult.  Patient was seen by neurology, Dr. Erlinda Hong; who recommended against tPA administration given his last known normal is not well-established.  Hospital service consulted for further evaluation and treatment of acute COVID-19 infection, acute metabolic encephalopathy, and hypoglycemia.   Assessment & Plan:   Principal Problem:   Acute metabolic encephalopathy Active Problems:   Acute left-sided weakness   Mixed diabetic hyperlipidemia associated with type 2 diabetes mellitus ([place])   Uncontrolled type 2 diabetes mellitus with hypoglycemia without coma, with long-term current use of insulin ([place])   COVID-19 virus infection   GERD without esophagitis    Bradycardia   Acute metabolic encephalopathy, POA Patient presenting with confusion, concern for possible focal neurological weakness with a possible CVA.  Patient was also noted to be hypoglycemic with reported poor oral oral intake in the preceding days prior to hospitalization and with recent loss of his girlfriend last week.  Patient was found to be COVID-19 positive.  CT head without contrast with area of possible concern of acute insult left frontotemporal gray-white junction.  MR brain with no acute intracranial findings but notes chronic small vessel ischemia.  CT angiogram head/neck with no large vessel occlusion.  Etiology of his presenting symptoms likely related to active COVID-19 infection coupled with poor oral intake and hypoglycemia.  Treatment as dictated below. --Neurology following --PT/OT/SLP pending --TTE pending  Hypoglycemia Hx T2DM Hemoglobin A1c 6.3, well-controlled.  Home regimen includes Toujeo 25 units subcutaneously daily.  Patient was found to have a blood sugar of 48 on EMS arrival. --Continue D10 infusion at 74mL/hr --Change q1h CBGs to q4h now that glucose seems to have stabilized on drip --Hold insulin for now until SLP swallow evaluation and start diet  Acute Covid-19 viral infection during the ongoing Covid 19 Pandemic - POA Patient with recent loss of his girlfriend of unclear reasons and was found to be positive for COVID-19 presentation.  Patient with poor oral intake, likely etiology from underlying viral infection.  Chest x-ray with heterogeneous areas coarse interstitial and patchy opacities throughout both lungs consistent with multifocal infection. --COVID test: + 07/16/2020 --CRP pending --ddimer pending --Remdesivir, plan 5-day course (Day #1/3) --Patient oxygenating well on room air, deferring baricitinib/Actemra and steroids at this time --prone for 2-3hrs every 12hrs if able --Continue  supplemental oxygen, titrate to maintain SPO2 greater than  92%, currently oxygenating well on room air --Continue supportive care with albuterol MDI prn, vitamin C, zinc, Tylenol, antitussives --Follow CBC, CMP, D-dimer, and CRP daily --Continue airborne/contact isolation precautions for 10 days from the day of diagnosis  The treatment plan and use of medications and known side effects were discussed with patient/family. Some of the medications used are based on case reports/anecdotal data.  All other medications being used in the management of COVID-19 based on limited study data.  Complete risks and long-term side effects are unknown, however in the best clinical judgment they seem to be of some benefit.  Patient wanted to proceed with treatment options provided.  Essential hypertension BP 168/89 this am. On lisinopril 5 mg p.o. daily at home. --Hold oral medicines until swallow eval --Hydralazine 10mg  q6h prn   Hyperlipidemia --Hold home atorvastatin 20 mg p.o. daily at home mental status improved and following swallow evaluation  GERD On Protonix 40 mg p.o. daily, Pepcid 40 mg daily, and Carafate 1 g p.o. 4 times daily at home. 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  Last seen by medical oncology, [facility] Don[facility].  CT angiogram head/neck on admission shows asymmetric echogenicity left lateral aspect tongue and nonenlarged prominent level 1 submental lymph node. -- Outpatient follow-up with oncology   DVT prophylaxis: Lovenox Code Status: Full code Family Communication: No family present at bedside this morning  Disposition Plan:  Status is: Inpatient  Remains inpatient appropriate because:Altered mental status, Ongoing diagnostic testing needed not appropriate for outpatient work up, Unsafe d/c plan, IV treatments appropriate due to intensity of illness or inability to  take PO and Inpatient level of care appropriate due to severity of illness   Dispo: The patient is from: Home              Anticipated d/c is to: To be determined              Anticipated d/c date is: 3 days              Patient currently is not medically stable to d/c.   Consultants:   Neurology  Procedures:   None  Antimicrobials:   None   Subjective: Patient seen and examined bedside, resting comfortably.  Continues in ED holding area.  Confusion improved, remains slow to respond but appropriate answers to orientation questions.  States he is hungry and thirsty.  Continues with weakness and fatigue.  No other specific complaints or concerns this morning.  Denies headache, no fever/chills/night sweats, no nausea/vomiting/diarrhea, no chest pain, palpitations, no shortness of breath, no abdominal pain.  Nursing now reports glucose has been stable requesting change in every hour glucose checks, otherwise no no acute concerns overnight per nursing staff.  Objective: Vitals:   07/17/20 0515 07/17/20 0600 07/17/20 0815 07/17/20 1113  BP: (!) 155/82 (!) 168/89 (!) 148/76   Pulse: (!) 50 (!) 50 (!) 48   Resp: 20 16 18    Temp:    97.6 F (36.4 C)  TempSrc:    Oral  SpO2: 96% 96% 98%    No intake or output data in the 24 hours ending 07/17/20 1116 There were no vitals filed for this visit.  Examination:  General exam: Appears calm and comfortable, appears older than stated age, ill in appearance Respiratory system: Clear  to auscultation. Respiratory effort normal.  Oxygenating well on room air Cardiovascular system: S1 & S2 heard, RRR. No JVD, murmurs, rubs, gallops or clicks. No pedal edema. Gastrointestinal system: Abdomen is nondistended, soft and nontender. No organomegaly or masses felt. Normal bowel sounds heard. Central nervous system: Alert and oriented. No focal neurological deficits. Extremities: Symmetric 5 x 5 power. Skin: No rashes, lesions or ulcers Psychiatry:  Judgement and insight appear poor.  Depressed mood & flat affect.     Data Reviewed: I have personally reviewed following labs and imaging studies  CBC: Recent Labs  Lab 07/16/20 1538 07/16/20 1550 07/17/20 0038  WBC 7.4  --   --   NEUTROABS 6.5  --   --   HGB 13.3 14.6 13.6  HCT 43.5 43.0 40.0  MCV 84.3  --   --   PLT 328  --   --    Basic Metabolic Panel: Recent Labs  Lab 07/16/20 1538 07/16/20 1550 07/17/20 0038 07/17/20 0509  NA 141 143 144 141  K 3.3* 3.2* 3.4* 3.4*  CL 103 104  --  104  CO2 25  --   --  24  GLUCOSE 60* 58*  --  67*  BUN 33* 34*  --  25*  CREATININE 1.17 1.10  --  0.85  CALCIUM 8.7*  --   --  8.6*  MG  --   --   --  2.3   GFR: CrCl cannot be calculated (Unknown ideal weight.). Liver Function Tests: Recent Labs  Lab 07/16/20 1538  AST 34  ALT 21  ALKPHOS 59  BILITOT 1.2  PROT 7.8  ALBUMIN 2.9*   No results for input(s): LIPASE, AMYLASE in the last 168 hours. Recent Labs  Lab 07/16/20 2127  AMMONIA 25   Coagulation Profile: Recent Labs  Lab 07/16/20 1538  INR 1.2   Cardiac Enzymes: No results for input(s): CKTOTAL, CKMB, CKMBINDEX, TROPONINI in the last 168 hours. BNP (last 3 results) No results for input(s): PROBNP in the last 8760 hours. HbA1C: Recent Labs    07/17/20 0509  HGBA1C 6.3*   CBG: Recent Labs  Lab 07/17/20 0414 07/17/20 0520 07/17/20 0602 07/17/20 0720 07/17/20 1111  GLUCAP 71 75 83 82 85   Lipid Profile: Recent Labs    07/17/20 0509  CHOL 100  HDL 26*  LDLCALC 63  TRIG 57  CHOLHDL 3.8   Thyroid Function Tests: Recent Labs    07/16/20 2127  TSH 2.695   Anemia Panel: Recent Labs    07/16/20 2127  VITAMINB12 144*  FOLATE 4.6*   Sepsis Labs: Recent Labs  Lab 07/16/20 2127  LATICACIDVEN 1.0    No results found for this or any previous visit (from the past 240 hour(s)).       Radiology Studies: CT ANGIO HEAD W OR WO CONTRAST  Result Date: 07/16/2020 CLINICAL DATA:  Code  stroke follow-up EXAM: CT ANGIOGRAPHY HEAD AND NECK TECHNIQUE: Multidetector CT imaging of the head and neck was performed using the standard protocol during bolus administration of intravenous contrast. Multiplanar CT image reconstructions and MIPs were obtained to evaluate the vascular anatomy. Carotid stenosis measurements (when applicable) are obtained utilizing NASCET criteria, using the distal internal carotid diameter as the denominator. CONTRAST:  52mL OMNIPAQUE IOHEXOL 350 MG/ML SOLN COMPARISON:  None. FINDINGS: CTA NECK Aortic arch: Great vessel origins are patent. Right carotid system: There is circumferential plaque about the common carotid causing 50% stenosis. Mixed plaque is present at the ICA origin  with minimal stenosis. Left carotid system: Patent. Mild noncalcified plaque along the common carotid causing less than 50% stenosis. Mixed plaque at the ICA origin causing minimal stenosis. Vertebral arteries: Patent and is codominant. Eccentric noncalcified plaque along the distal left V1 segment causing 50% stenosis. Skeleton: Mild cervical spine degenerative changes. Other neck: Probable radiation changes in the neck; correlate with history. There is asymmetric enhancement at the left lateral aspect of the tongue (series 7, image 85). No enlarged lymph nodes. Prominent level 1 submental node is not pathologically enlarged. Upper chest: Scarring at the lung apices. Review of the MIP images confirms the above findings CTA HEAD Anterior circulation: Intracranial internal carotid arteries are patent with calcified plaque causing mild stenosis. Anterior and middle cerebral arteries are patent. Posterior circulation: Intracranial vertebral arteries, basilar artery, and posterior cerebral arteries are patent. Venous sinuses: Patent as allowed by contrast bolus timing. Review of the MIP images confirms the above findings IMPRESSION: No large vessel occlusion. Circumferential plaque about the right common  carotid causing 50% stenosis. No hemodynamically significant stenosis at the ICA origins. Noncalcified plaque along the distal left V1 vertebral artery causing 50% stenosis. Asymmetric enhancement at the left lateral aspect of the tongue (series 7, image 85). Recommend direct visual inspection in this patient with history of head/neck cancer. Nonenlarged, but prominent submental lymph node. Electronically Signed   By: Macy Mis M.D.   On: 07/16/2020 16:29   DG Chest 1 View  Result Date: 07/17/2020 CLINICAL DATA:  Hypoxia, COVID-19, code stroke EXAM: CHEST  1 VIEW COMPARISON:  Radiograph 02/09/2020, CT 10/08/2015 FINDINGS: Heterogeneous areas of coarse interstitial and patchy opacity throughout both lungs. No pneumothorax. No effusion. Pulmonary vascularity is normally distributed. The aorta is calcified. The remaining cardiomediastinal contours are unremarkable. No acute osseous or soft tissue abnormality. Degenerative changes are present in the imaged spine and shoulders. Telemetry leads overlie the chest. IMPRESSION: Heterogeneous areas of coarse interstitial and patchy opacity throughout both lungs, compatible with a multifocal infection in the setting of COVID-19. Electronically Signed   By: Lovena Le M.D.   On: 07/17/2020 00:33   CT ANGIO NECK W OR WO CONTRAST  Result Date: 07/16/2020 CLINICAL DATA:  Code stroke follow-up EXAM: CT ANGIOGRAPHY HEAD AND NECK TECHNIQUE: Multidetector CT imaging of the head and neck was performed using the standard protocol during bolus administration of intravenous contrast. Multiplanar CT image reconstructions and MIPs were obtained to evaluate the vascular anatomy. Carotid stenosis measurements (when applicable) are obtained utilizing NASCET criteria, using the distal internal carotid diameter as the denominator. CONTRAST:  21mL OMNIPAQUE IOHEXOL 350 MG/ML SOLN COMPARISON:  None. FINDINGS: CTA NECK Aortic arch: Great vessel origins are patent. Right carotid  system: There is circumferential plaque about the common carotid causing 50% stenosis. Mixed plaque is present at the ICA origin with minimal stenosis. Left carotid system: Patent. Mild noncalcified plaque along the common carotid causing less than 50% stenosis. Mixed plaque at the ICA origin causing minimal stenosis. Vertebral arteries: Patent and is codominant. Eccentric noncalcified plaque along the distal left V1 segment causing 50% stenosis. Skeleton: Mild cervical spine degenerative changes. Other neck: Probable radiation changes in the neck; correlate with history. There is asymmetric enhancement at the left lateral aspect of the tongue (series 7, image 85). No enlarged lymph nodes. Prominent level 1 submental node is not pathologically enlarged. Upper chest: Scarring at the lung apices. Review of the MIP images confirms the above findings CTA HEAD Anterior circulation: Intracranial internal carotid arteries are  patent with calcified plaque causing mild stenosis. Anterior and middle cerebral arteries are patent. Posterior circulation: Intracranial vertebral arteries, basilar artery, and posterior cerebral arteries are patent. Venous sinuses: Patent as allowed by contrast bolus timing. Review of the MIP images confirms the above findings IMPRESSION: No large vessel occlusion. Circumferential plaque about the right common carotid causing 50% stenosis. No hemodynamically significant stenosis at the ICA origins. Noncalcified plaque along the distal left V1 vertebral artery causing 50% stenosis. Asymmetric enhancement at the left lateral aspect of the tongue (series 7, image 85). Recommend direct visual inspection in this patient with history of head/neck cancer. Nonenlarged, but prominent submental lymph node. Electronically Signed   By: Macy Mis M.D.   On: 07/16/2020 16:29   MR BRAIN WO CONTRAST  Result Date: 07/16/2020 CLINICAL DATA:  Transient ischemic attack.  Slurred speech. EXAM: MRI HEAD WITHOUT  CONTRAST TECHNIQUE: Multiplanar, multiecho pulse sequences of the brain and surrounding structures were obtained without intravenous contrast. COMPARISON:  None. FINDINGS: Brain: No acute infarct, mass effect or extra-axial collection. Single chronic microhemorrhage in the left parietal lobe. There is multifocal hyperintense T2-weighted signal within the white matter. Generalized volume loss without a clear lobar predilection. The midline structures are normal. Vascular: Major flow voids are preserved. Skull and upper cervical spine: Normal calvarium and skull base. Visualized upper cervical spine and soft tissues are normal. Sinuses/Orbits:Trace fluid in the right maxillary sinus. Normal orbits. IMPRESSION: 1. No acute intracranial abnormality. 2. Findings of chronic small vessel ischemia and generalized volume loss. Electronically Signed   By: Ulyses Jarred M.D.   On: 07/16/2020 23:26   CT HEAD CODE STROKE WO CONTRAST  Result Date: 07/16/2020 CLINICAL DATA:  Code stroke.  Neuro deficit, acute, stroke suspected EXAM: CT HEAD WITHOUT CONTRAST TECHNIQUE: Contiguous axial images were obtained from the base of the skull through the vertex without intravenous contrast. COMPARISON:  None. FINDINGS: Brain: No intracranial hemorrhage. Asymmetric blurring of the gray-white junctions involving the left frontotemporal region. No mass lesion. No midline shift, ventriculomegaly or extra-axial fluid collection. Vascular: No hyperdense vessel or unexpected calcification. Bilateral skull base atherosclerotic calcifications. Skull: Negative for fracture or focal lesion. Sinuses/Orbits: Normal orbits. Right maxillary sinus secretions. No mastoid effusion. Other: None. ASPECTS [facility] Stroke Program Early CT Score) - Ganglionic level infarction (caudate, lentiform nuclei, internal capsule, insula, M1-M3 cortex): 6 - Supraganglionic infarction (M4-M6 cortex): 2 Total score (0-10 with 10 being normal): 8 IMPRESSION: 1. Asymmetric  blurring of the left frontotemporal gray-white junctions may reflect artifact versus acute insult. 2. ASPECTS is 8 3. Code stroke imaging results were communicated on 07/16/2020 at 3:49 pm to provider Dr. Erlinda Hong Via secure text paging. Electronically Signed   By: Primitivo Gauze M.D.   On: 07/16/2020 16:00        Scheduled Meds: .  stroke: mapping our early stages of recovery book   Does not apply Once  . vitamin C  500 mg Oral Daily  . aspirin  300 mg Rectal Daily   Or  . aspirin  325 mg Oral Daily  . enoxaparin (LOVENOX) injection  40 mg Subcutaneous Daily  . pantoprazole (PROTONIX) IV  40 mg Intravenous Daily  . zinc sulfate  220 mg Oral Daily   Continuous Infusions: . dextrose 75 mL/hr at 07/16/20 2340  . remdesivir 200 mg in sodium chloride 0.9% 250 mL IVPB     Followed by  . [START ON 07/18/2020] remdesivir 100 mg in NS 100 mL  LOS: 1 day    Time spent: 38 minutes spent on chart review, discussion with nursing staff, consultants, updating family and interview/physical exam; more than 50% of that time was spent in counseling and/or coordination of care.    Cody Oliger J [place] DO [facility] Available via Epic secure chat 7am-7pm After these hours, please refer to coverage provider listed on [email or web address] 07/17/2020, 11:16 AM

## 2020-07-18 ENCOUNTER — Other Ambulatory Visit: Payer: Self-pay

## 2020-07-18 DIAGNOSIS — G9341 Metabolic encephalopathy: Secondary | ICD-10-CM | POA: Diagnosis not present

## 2020-07-18 LAB — CBC
HCT: 40.2 % (ref 39.0–52.0)
Hemoglobin: 13.2 g/dL (ref 13.0–17.0)
MCH: 26.5 pg (ref 26.0–34.0)
MCHC: 32.8 g/dL (ref 30.0–36.0)
MCV: 80.7 fL (ref 80.0–100.0)
Platelets: 313 10*3/uL (ref 150–400)
RBC: 4.98 MIL/uL (ref 4.22–5.81)
RDW: 13.7 % (ref 11.5–15.5)
WBC: 7.6 10*3/uL (ref 4.0–10.5)
nRBC: 0 % (ref 0.0–0.2)

## 2020-07-18 LAB — COMPREHENSIVE METABOLIC PANEL
ALT: 19 U/L (ref 0–44)
AST: 30 U/L (ref 15–41)
Albumin: 2.3 g/dL — ABNORMAL LOW (ref 3.5–5.0)
Alkaline Phosphatase: 56 U/L (ref 38–126)
Anion gap: 8 (ref 5–15)
BUN: 15 mg/dL (ref 8–23)
CO2: 27 mmol/L (ref 22–32)
Calcium: 8.3 mg/dL — ABNORMAL LOW (ref 8.9–10.3)
Chloride: 98 mmol/L (ref 98–111)
Creatinine, Ser: 0.93 mg/dL (ref 0.61–1.24)
GFR, Estimated: >60 mL/min (ref >60)
Glucose, Bld: 123 mg/dL — ABNORMAL HIGH (ref 70–99)
Potassium: 3.3 mmol/L — ABNORMAL LOW (ref 3.5–5.1)
Sodium: 133 mmol/L — ABNORMAL LOW (ref 135–145)
Total Bilirubin: 0.7 mg/dL (ref 0.3–1.2)
Total Protein: 6.4 g/dL — ABNORMAL LOW (ref 6.5–8.1)

## 2020-07-18 LAB — GLUCOSE, CAPILLARY
Glucose-Capillary: 121 mg/dL — ABNORMAL HIGH (ref 70–99)
Glucose-Capillary: 133 mg/dL — ABNORMAL HIGH (ref 70–99)
Glucose-Capillary: 150 mg/dL — ABNORMAL HIGH (ref 70–99)
Glucose-Capillary: 156 mg/dL — ABNORMAL HIGH (ref 70–99)
Glucose-Capillary: 160 mg/dL — ABNORMAL HIGH (ref 70–99)
Glucose-Capillary: 195 mg/dL — ABNORMAL HIGH (ref 70–99)

## 2020-07-18 LAB — MAGNESIUM: Magnesium: 1.9 mg/dL (ref 1.7–2.4)

## 2020-07-18 LAB — PHOSPHORUS: Phosphorus: 2.1 mg/dL — ABNORMAL LOW (ref 2.5–4.6)

## 2020-07-18 LAB — D-DIMER, QUANTITATIVE: D-Dimer, Quant: 2.69 ug/mL-FEU — ABNORMAL HIGH (ref 0.00–0.50)

## 2020-07-18 LAB — C-REACTIVE PROTEIN: CRP: 7.3 mg/dL — ABNORMAL HIGH (ref <1.0)

## 2020-07-18 MED ORDER — POTASSIUM CHLORIDE CRYS ER 20 MEQ PO TBCR: 40.0000 meq | EXTENDED_RELEASE_TABLET | Freq: Once | ORAL | Status: DC

## 2020-07-18 MED ORDER — POTASSIUM CHLORIDE CRYS ER 20 MEQ PO TBCR
40.0000 meq | EXTENDED_RELEASE_TABLET | Freq: Once | ORAL | Status: AC
  Administered 2020-07-18: 40 meq via ORAL
  Filled 2020-07-18: qty 2

## 2020-07-18 MED ORDER — BENZONATATE 100 MG PO CAPS
200.0000 mg | ORAL_CAPSULE | Freq: Three times a day (TID) | ORAL | Status: DC | PRN
  Administered 2020-07-21: 200 mg via ORAL
  Filled 2020-07-18: qty 2

## 2020-07-18 MED ORDER — LISINOPRIL 5 MG PO TABS
5.0000 mg | ORAL_TABLET | Freq: Every day | ORAL | Status: DC
  Administered 2020-07-18 – 2020-07-21 (×4): 5 mg via ORAL
  Filled 2020-07-18 (×4): qty 1

## 2020-07-18 MED ORDER — PANTOPRAZOLE SODIUM 40 MG PO TBEC
40.0000 mg | DELAYED_RELEASE_TABLET | Freq: Every day | ORAL | Status: DC
Start: 2020-07-18 — End: 2020-07-21
  Administered 2020-07-19 – 2020-07-21 (×3): 40 mg via ORAL
  Filled 2020-07-18 (×3): qty 1

## 2020-07-18 MED ORDER — POTASSIUM PHOSPHATES 15 MMOLE/5ML IV SOLN
30.0000 mmol | Freq: Once | INTRAVENOUS | Status: AC
  Administered 2020-07-18: 30 mmol via INTRAVENOUS
  Filled 2020-07-18: qty 10

## 2020-07-18 MED ORDER — METHYLPREDNISOLONE SODIUM SUCC 40 MG IJ SOLR
40.0000 mg | Freq: Two times a day (BID) | INTRAMUSCULAR | Status: DC
  Administered 2020-07-18 – 2020-07-20 (×4): 40 mg via INTRAVENOUS
  Filled 2020-07-18 (×4): qty 1

## 2020-07-18 MED ORDER — SODIUM CHLORIDE 0.9 % IV SOLN
100.0000 mg | Freq: Every day | INTRAVENOUS | Status: AC
  Administered 2020-07-19 – 2020-07-21 (×3): 100 mg via INTRAVENOUS
  Filled 2020-07-18 (×3): qty 100

## 2020-07-18 NOTE — Plan of Care (Signed)
  Problem: Education: Goal: Knowledge of risk factors and measures for prevention of condition will improve Outcome: Progressing   Problem: Coping: Goal: Psychosocial and spiritual needs will be supported Outcome: Progressing   Problem: Respiratory: Goal: Will maintain a patent airway Outcome: Progressing Goal: Complications related to the disease process, condition or treatment will be avoided or minimized Outcome: Progressing   Problem: Education: Goal: Knowledge of disease or condition will improve Outcome: Progressing Goal: Knowledge of secondary prevention will improve Outcome: Progressing Goal: Knowledge of patient specific risk factors addressed and post discharge goals established will improve Outcome: Progressing   Problem: Coping: Goal: Will verbalize positive feelings about self Outcome: Progressing Goal: Will identify appropriate support needs Outcome: Progressing   Problem: Self-Care: Goal: Ability to participate in self-care as condition permits will improve Outcome: Progressing Goal: Ability to communicate needs accurately will improve Outcome: Progressing   Problem: Nutrition: Goal: Risk of aspiration will decrease Outcome: Progressing   Problem: Ischemic Stroke/TIA Tissue Perfusion: Goal: Complications of ischemic stroke/TIA will be minimized Outcome: Progressing   Problem: Education: Goal: Knowledge of General Education information will improve Description: Including pain rating scale, medication(s)/side effects and non-pharmacologic comfort measures Outcome: Progressing   Problem: Health Behavior/Discharge Planning: Goal: Ability to manage health-related needs will improve Outcome: Progressing   Problem: Clinical Measurements: Goal: Ability to maintain clinical measurements within normal limits will improve Outcome: Progressing Goal: Will remain free from infection Outcome: Progressing Goal: Diagnostic test results will improve Outcome:  Progressing Goal: Respiratory complications will improve Outcome: Progressing Goal: Cardiovascular complication will be avoided Outcome: Progressing   Problem: Activity: Goal: Risk for activity intolerance will decrease Outcome: Progressing   Problem: Nutrition: Goal: Adequate nutrition will be maintained Outcome: Progressing   Problem: Coping: Goal: Level of anxiety will decrease Outcome: Progressing   Problem: Elimination: Goal: Will not experience complications related to bowel motility Outcome: Progressing Goal: Will not experience complications related to urinary retention Outcome: Progressing   Problem: Pain Managment: Goal: General experience of comfort will improve Outcome: Progressing   Problem: Safety: Goal: Ability to remain free from injury will improve Outcome: Progressing   Problem: Skin Integrity: Goal: Risk for impaired skin integrity will decrease Outcome: Progressing

## 2020-07-18 NOTE — Progress Notes (Addendum)
Speech Pathology: Bedside swallow eval LATE NOTE FROM 07/17/20  Pt evaluated and documentation completed by Herbie Baltimore; Progress note populated by Juan Quam     07/17/20 1400  SLP Visit Information  SLP Received On 07/17/20  General Information  HPI Mr. Louis Stone is a 70 y.o. male with history of hyperlipidemia, hypertension, gastroesophageal reflux disease, diabetes mellitus type 2 and oropharyngeal cancer who presented with generalized weakness, dysarthria and LUE weakness found to have severe hypoglycemia. He was incidentally found to be positive for COVID 19 on recent PCR testing with sick contact confirmed (girlfriend's daughter). There is  concern for PNA on XR chest. No hypoxia.Pts history of oropharyngeal cancer includes this information from 2018 clinical swallow eval "Pt exhibits reduced mouth opening/excursion to take boluses d/t results of h/o oral-pharynx cancer(this presentation is baseline for pt since 2008-2009 per his report)." Pt recommended to consume mech soft solids at that time.  Type of Study Bedside Swallow Evaluation  Diet Prior to this Study Regular;Thin liquids  Temperature Spikes Noted No  Respiratory Status Room air  History of Recent Intubation No  Behavior/Cognition Alert;Cooperative;Pleasant mood  Oral Cavity Assessment WFL  Oral Care Completed by SLP No  Oral Cavity - Dentition Dentures, top;Dentures, bottom  Vision Functional for self-feeding  Self-Feeding Abilities Able to feed self  Patient Positioning Upright in bed  Baseline Vocal Quality Normal  Volitional Cough Strong  Volitional Swallow Able to elicit  Oral Motor/Sensory Function  Overall Oral Motor/Sensory Function WFL  Thin Liquid  Thin Liquid Impaired  Presentation Straw  Oral Phase Impairments Reduced lingual movement/coordination  Pharyngeal  Phase Impairments Multiple swallows;Decreased hyoid-laryngeal movement  Nectar Thick Liquid  Nectar Thick Liquid NT  Honey  Thick Liquid  Honey Thick Liquid NT  Puree  Puree Impaired  Presentation Spoon;Self Fed  Oral Phase Impairments Reduced lingual movement/coordination  Pharyngeal Phase Impairments Decreased hyoid-laryngeal movement;Multiple swallows  Solid  Solid Impaired  Presentation Self Fed  Oral Phase Impairments Impaired mastication;Reduced lingual movement/coordination  Oral Phase Functional Implications Prolonged oral transit;Impaired mastication  SLP - End of Session  Patient left in bed  SLP Assessment  Clinical Impression Statement (ACUTE ONLY) Pt seen in ED, demonstrates a stable chronic baseline dysphagia secondary to a history of head and neck cancer with radiation. He is has moderate trismus, can only open mouth about an inch, decreased lingual lateralization and fibrotic neck tissue with apparent decreased hyolaryngeal movement with swallows. Despite these impairments, pt has been toelrating soft solids and thin liquids since 2008. He does ahve to take extra precautions, such as soft moist food choices, following bites with sips, crushing meds. He is not the best historian and these behaviors were more observed than explicitly stated. Pt does feel he typically eats well, but not over the past few days due to lack of appetite, likely due to change in level of care (partner died) and COVID 19 infection. Pt is recommended to resume a dys 3 mech soft diet with thin liquids. Discussed ened for crushed meds with RN. Set up suction in room given oral impairment. Will f/u for tolerance as needed while admitted.  SLP Visit Diagnosis Dysphagia, unspecified (R13.10)  Impact on safety and function Moderate aspiration risk;Risk for inadequate nutrition/hydration  Other Related Risk Factors History of dysphagia  Swallow Evaluation Recommendations  Medication Administration Crushed with puree  Treatment Plan  Treatment Recommendations Therapy as outlined in treatment plan below  Follow up Recommendations None   Speech Therapy Frequency (ACUTE ONLY) min 2x/week  Treatment Duration 2 weeks  Prognosis  Prognosis for Safe Diet Advancement Fair  Individuals Consulted  Consulted and Agree with Results and Recommendations Patient;RN  SLP Time Calculation  SLP Start Time (ACUTE ONLY) 1415  SLP Stop Time (ACUTE ONLY) 1430  SLP Time Calculation (min) (ACUTE ONLY) 15 min  SLP Evaluations  $ SLP Speech Visit 1 Visit  SLP Evaluations  $BSS Swallow 1 Procedure

## 2020-07-18 NOTE — Significant Event (Addendum)
Rapid Response Event Note   Reason for Call :   O2 sats 82% on 4L [place] Per RN patient choked on his pill when she was giving him this morning medication.  Initial Focused Assessment:  Upon my arrival he is in no distress on a NRB. Lung sounds decreased bases. Heart tones regular He is alert and oriented.  Interventions:  Assisted patient OOB to chair, he moved easily and tolerated the activity well. Placed on 6L [place] O2 sats 94% IS 500 No respiratory distress  Plan of Care:  Continue to wean O2 as tolerated  1600:  Patient is sitting up in the chair.  OT assisted him to ambulate to the door x2,  O2 sat to 85-90%  And HR 120s.  After sitting for a few minutes HR 90s O2 sat 91 on 4L RR 21  Event Summary:   MD Notified: [place] Call Time: 2957 Arrival Time: 4734 End Time: [place]  Raliegh Ip, RN

## 2020-07-18 NOTE — Progress Notes (Signed)
PROGRESS NOTE  Louis Stone  DOB: 1951/07/01  PCP: Theotis Burrow, MD JC:[id number]  DOA: 07/16/2020  LOS: 2 days   Chief Complaint  Patient presents with  . Code Stroke   Brief narrative: Louis Stone is a 70 y.o. male with PMH significant for DM2, HTN, HLD, GERD, oropharyngeal cancer. Patient was brought to the ED on 1/12 by EMS after family found him slumped on the chair confused and lethargic.   EMS noted him hypoglycemic to 48.  There was also concern of possible left-sided weakness with dysarthria and patient was transported to [facility] ED for concern of acute stroke. Of note, patient was living with his girlfriend, in which she passed away last week.  And per family's report, since he has been not feeling well with poor oral intake.  In the ED, patient was hemodynamically stable. COVID PCR positive Chest x-ray showed heterogeneous areas coarse interstitial and patchy opacities throughout both lungs consistent with multifocal infection. CT head without contrast with asymmetric blurring left frontotemporal gray-white junctions which may reflect artifact versus acute insult.  Patient was seen by neurology, Dr. Erlinda Hong; who recommended against tPA administration given his last known normal is not well-established.   Patient was admitted under hospitalist service   Subjective: Patient was seen and examined this afternoon.  Elderly African-American male.  Sitting up in chair.  Not in distress.  On 4 L oxygen by nasal telemetry.  Working with occupational therapy.  Assessment/Plan: Acute metabolic encephalopathy, POA -Brought into the hospital with confusion, possible focal neurological deficit.  -Initially concern for CVA.  Neurology evaluation was obtained.   CT head without contrast with area of possible concern of acute insult left frontotemporal gray-white junction.  MR brain with no acute intracranial findings but notes chronic small vessel ischemia.  CT  angiogram head/neck with no large vessel occlusion.  -It was ultimately determined that patient's altered mental status was due to hypoglycemia, coexisting COVID-19 infection, poor oral intake.  -PT/OT/ST eval obtained.  COVID pneumonia Acute respiratory failure with hypoxia  -Presented with lethargy -COVID test: PCR positive -Chest x-ray showed heterogeneous areas coarse interstitial and patchy opacities throughout both lungs consistent with multifocal infection. -Treatment: On a course of IV remdesivir.  I we will plan to continue it for 5 days.  I will also start the patient on Solu-Medrol IV twice daily. -Oxygen - SpO2: 94 % O2 Flow Rate (L/min): 4 L/min -Supportive care: Vitamin C, Zinc, PRN inhalers, Tylenol, Antitussives (benzonatate/ Mucinex/Tussionex).   -Encouraged incentive spirometry, prone position, out of bed and early mobilization as much as possible -Continue airborne/contact isolation precautions for duration of 3 weeks from the day of diagnosis. -WBC and inflammatory markers trend as below. Recent Labs  Lab 07/16/20 1538 07/16/20 2127 07/18/20 0500  WBC 7.4  --  7.6  LATICACIDVEN  --  1.0  --   DDIMER  --   --  2.69*  CRP  --   --  7.3*  ALT 21  --  19   The treatment plan and use of medications and known side effects were discussed with patient/family. Some of the medications used are based on case reports/anecdotal data.  All other medications being used in the management of COVID-19 based on limited study data.  Complete risks and long-term side effects are unknown, however in the best clinical judgment they seem to be of some benefit.  Patient wanted to proceed with treatment options provided.  Dysphagia -Speech therapy evaluation ordered..  Hypoglycemia Type  2 diabetes mellitus -A1c 6.3 on 1/13.   -Home regimen includes Toujeo 25 units subcutaneously daily.  Patient was found to have a blood sugar of 48 on EMS arrival.  He was started on D10 drip and sliding  scale insulin. -He is currently not on any long-acting insulin.  Patient's alertness and appetite has improved.  We will stop dextrose drip at this time.  Continue blood sugar monitoring. -Will initiate long-acting insulin once the blood sugar level trends up. Recent Labs  Lab 07/18/20 0042 07/18/20 0336 07/18/20 0853 07/18/20 1141 07/18/20 1544  GLUCAP 133* 121* 160* 156* 150*   Hypokalemia/hypophosphatemia -Replacement ordered this afternoon.  Repeat tomorrow. Recent Labs  Lab 07/16/20 1538 07/16/20 1550 07/17/20 0038 07/17/20 0509 07/18/20 0500  K 3.3* 3.2* 3.4* 3.4* 3.3*  MG  --   --   --  2.3 1.9  PHOS  --   --   --   --  2.1*   Essential hypertension -Resume lisinopril.  Continue hydralazine IV as needed.    Hyperlipidemia -Continue aspirin and statin  GERD -On Protonix 40 mg p.o. daily, Pepcid 40 mg daily, and Carafate 1 g p.o. 4 times daily at home. -Resume oral meds.  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  Last seen by medical oncology, Dr. Rogue Bussing at [facility] 09/11/2017.  CT angiogram head/neck on admission shows asymmetric echogenicity left lateral aspect tongue and nonenlarged prominent level 1 submental lymph node. -Outpatient follow-up with oncology  Mobility: PT/OT eval Code Status:   Code Status: Full Code  Nutritional status: There is no height or weight on file to calculate BMI.     Diet Order            DIET DYS 3 Room service appropriate? Yes; Fluid consistency: Thin  Diet effective now                 DVT prophylaxis: enoxaparin (LOVENOX) injection 40 mg Start: 07/17/20 1000   Antimicrobials:  None Fluid: Stop dextrose drip Consultants: Neurology Family Communication:  Not at bedside  Status is: Inpatient  Remains inpatient appropriate because -ongoing management for COVID-19 pneumonia  Dispo:  Patient From: Home  Planned  Disposition: To be determined  Expected discharge date: 1/17  medically stable for discharge: No        Infusions:  . potassium PHOSPHATE IVPB (in mmol)    . [START ON 07/19/2020] remdesivir 100 mg in NS 100 mL      Scheduled Meds: .  stroke: mapping our early stages of recovery book   Does not apply Once  . vitamin C  500 mg Oral Daily  . aspirin EC  81 mg Oral Daily  . atorvastatin  20 mg Oral QHS  . enoxaparin (LOVENOX) injection  40 mg Subcutaneous Daily  . lisinopril  5 mg Oral Daily  . methylPREDNISolone (SOLU-MEDROL) injection  40 mg Intravenous Q12H  . pantoprazole  40 mg Oral Daily  . potassium chloride  40 mEq Oral Once  . zinc sulfate  220 mg Oral Daily    Antimicrobials: Anti-infectives (From admission, onward)   Start     Dose/Rate Route Frequency Ordered Stop   07/19/20 1000  remdesivir 100 mg in sodium chloride 0.9 % 100 mL IVPB       "Followed by" Linked Group Details   100 mg 200 mL/hr over 30 Minutes Intravenous Daily 07/18/20 1602 07/22/20 0959   07/18/20 1000  remdesivir 100 mg in sodium chloride 0.9 % 100 mL IVPB  Status:  Discontinued       "Followed by" Linked Group Details   100 mg 200 mL/hr over 30 Minutes Intravenous Daily 07/17/20 0936 07/18/20 1602   07/17/20 1030  remdesivir 200 mg in sodium chloride 0.9% 250 mL IVPB       "Followed by" Linked Group Details   200 mg 580 mL/hr over 30 Minutes Intravenous Once 07/17/20 0936 07/17/20 1724      PRN meds: acetaminophen **OR** acetaminophen (TYLENOL) oral liquid 160 mg/5 mL **OR** acetaminophen, albuterol, benzonatate, chlorpheniramine-HYDROcodone, guaiFENesin-dextromethorphan, hydrALAZINE, ondansetron (ZOFRAN) IV   Objective: Vitals:   07/18/20 1140 07/18/20 1142  BP: 125/75 125/75  Pulse: 73 73  Resp: (!) 24 19  Temp:  98.6 F (37 C)  SpO2: 94% 94%    Intake/Output Summary (Last 24 hours) at 07/18/2020 1610 Last data filed at 07/18/2020 1500 Gross per 24 hour  Intake 1914.38 ml   Output 650 ml  Net 1264.38 ml   There were no vitals filed for this visit. Weight change:  There is no height or weight on file to calculate BMI.   Physical Exam: General exam: Thin built elderly African-American male.  Not in physical distress Skin: No rashes, lesions or ulcers. HEENT: Atraumatic, normocephalic, no obvious bleeding Lungs: Diminished air entry bilaterally CVS: Regular rate and rhythm, no murmur GI/Abd soft and nontender, nondistended, bowel sounds present CNS: Hard of hearing but oriented to place and person Psychiatry: Depressed look Extremities: No pedal edema, no calf tenderness  Data Review: I have personally reviewed the laboratory data and studies available.  Recent Labs  Lab 07/16/20 1538 07/16/20 1550 07/17/20 0038 07/18/20 0500  WBC 7.4  --   --  7.6  NEUTROABS 6.5  --   --   --   HGB 13.3 14.6 13.6 13.2  HCT 43.5 43.0 40.0 40.2  MCV 84.3  --   --  80.7  PLT 328  --   --  313   Recent Labs  Lab 07/16/20 1538 07/16/20 1550 07/17/20 0038 07/17/20 0509 07/18/20 0500  NA 141 143 144 141 133*  K 3.3* 3.2* 3.4* 3.4* 3.3*  CL 103 104  --  104 98  CO2 25  --   --  24 27  GLUCOSE 60* 58*  --  67* 123*  BUN 33* 34*  --  25* 15  CREATININE 1.17 1.10  --  0.85 0.93  CALCIUM 8.7*  --   --  8.6* 8.3*  MG  --   --   --  2.3 1.9  PHOS  --   --   --   --  2.1*    F/u labs ordered  Signed, Terrilee Croak, MD [facility] 07/18/2020

## 2020-07-18 NOTE — Progress Notes (Signed)
  Speech Language Pathology Treatment: Dysphagia  Patient Details Name: Louis Stone MRN: [id number] DOB: August 10, 1950 Today's Date: 07/18/2020 Time: 3295-1884 SLP Time Calculation (min) (ACUTE ONLY): 21 min  Assessment / Plan / Recommendation Clinical Impression  Ongoing diagnostic dysphagia therapy provided today; Pt reportedly "choked" on a large pill provided earlier today and rapid response was called. Pt was briefly placed on non-rebreather mask but is now tolerating nasal cannula 4L with O2 in the 90s.  Pt consumed sips of thin without incident and consumed applesauce without overt s/sx of aspiration. As previously reported, trismus limits oral cavity opening more than about an inch; pt was presented with a soft cookie. Pt put a piece of the cookie in his mouth and despite a lengthy amount of time, was unable to thoroughly masticate the cookie which was eventually expectorated. Recommend downgrade Pt to D1/puree continue with thin liquids and meds should be crushed in puree. ST will continue to follow acutely  .  HPI HPI: Mr. Petro Talent is a 70 y.o. male with history of hyperlipidemia, hypertension, gastroesophageal reflux disease, diabetes mellitus type 2 and oropharyngeal cancer who presented with generalized weakness, dysarthria and LUE weakness found to have severe hypoglycemia. He was incidentally found to be positive for COVID 19 on recent PCR testing with sick contact confirmed (girlfriend's daughter). There is  concern for PNA on XR chest. No hypoxia.Pts history of oropharyngeal cancer includes this information from 2018 clinical swallow eval "Pt exhibits reduced mouth opening/excursion to take boluses d/t results of h/o oral-pharynx cancer(this presentation is baseline for pt since 2008-2009 per his report)." Pt recommended to consume mech soft solids at that time.      SLP Plan  Continue with current plan of care       Recommendations  Diet recommendations: Dysphagia  1 (puree);Thin liquid Liquids provided via: Cup;Straw Medication Administration: Crushed with puree Supervision: Patient able to self feed Compensations: Minimize environmental distractions;Slow rate;Small sips/bites;Clear throat intermittently Postural Changes and/or Swallow Maneuvers: Seated upright 90 degrees;Upright 30-60 min after meal                Plan: Continue with current plan of care       Damarkus Balis H. Roddie Mc, CCC-SLP Speech Language Pathologist   Wende Bushy 07/18/2020, 5:30 PM

## 2020-07-18 NOTE — Evaluation (Signed)
Occupational Therapy Evaluation Patient Details Name: Louis Stone MRN: [id number] DOB: 1950/11/23 Today's Date: 07/18/2020    History of Present Illness Pt is a 70 y/o male admitted secondary to weakness and decreased responsiveness. Thought to be secondary to acute metabolic encephalopathy and hypoglycemia. Pt also found to be COVID +. PMH includes DM, HTN, GERD, oropharyngeal cancer.   Clinical Impression   PTA, pt was living alone and was independent; using a RW "as needed" per patient. Pt currently requiring Min A for LB ADLs and functional mobility. Presenting with decreased balance and posterior/left lateral lean; one episode of LOB while using urinal requiring physical A to prevent fall. Pt also presenting with decreased activity tolerance as seen by fatigue and drops in SpO2 requiring 4L O2 to maintain >87%. Pt would benefit from further acute OT to facilitate safe dc. Recommend dc to SNF for further OT to optimize safety, independence with ADLs, and return to PLOF.   BP 147/85 (101).  Rest: SPO2 96% on 4L; dropped to 86% on 2L at rest. HR 101. RR 18-22.  Activity: SpO2 90-87% on 4L, HR 120-130s, RR 30s.    Follow Up Recommendations  SNF    Equipment Recommendations  Other (comment) (Defer to next venue)    Recommendations for Other Services PT consult;Speech consult     Precautions / Restrictions Precautions Precautions: Fall      Mobility Bed Mobility               General bed mobility comments: In recliner upon arrival    Transfers Overall transfer level: Needs assistance Equipment used: None;Rolling walker (2 wheeled) Transfers: Sit to/from Stand Sit to Stand: Min guard;Min assist         General transfer comment: Without RW. Pt requiring Min A for correcting posterior lean. Min Guard A for safety with RW    Balance Overall balance assessment: Needs assistance Sitting-balance support: Feet supported Sitting balance-Leahy Scale: Fair      Standing balance support: Single extremity supported;During functional activity Standing balance-Leahy Scale: Poor                             ADL either performed or assessed with clinical judgement   ADL Overall ADL's : Needs assistance/impaired Eating/Feeding: Set up;Sitting Eating/Feeding Details (indicate cue type and reason): Difficulty swallowing pill earlier Grooming: Set up;Supervision/safety;Sitting   Upper Body Bathing: Min guard;Sitting   Lower Body Bathing: Minimal assistance;Sit to/from stand   Upper Body Dressing : Min guard;Sitting   Lower Body Dressing: Minimal assistance;Sit to/from stand   Toilet Transfer: Minimal assistance;Ambulation;RW (simulated to recliner) Toilet Transfer Details (indicate cue type and reason): Min A for posterior and left lean         Functional mobility during ADLs: Minimal assistance;Rolling walker General ADL Comments: pt presenting with decreased cognition, balance, and activity tolerance. Presenting with posterior and left lean requiring Min A and RW for balance. Pt fatigues quickly     Vision Baseline Vision/History: Wears glasses Wears Glasses: At all times Patient Visual Report: No change from baseline       Perception     Praxis      Pertinent Vitals/Pain Pain Assessment: No/denies pain     Hand Dominance Right   Extremity/Trunk Assessment Upper Extremity Assessment Upper Extremity Assessment: Generalized weakness   Lower Extremity Assessment Lower Extremity Assessment: Generalized weakness   Cervical / Trunk Assessment Cervical / Trunk Assessment: Normal   Communication Communication Communication:  HOH   Cognition Arousal/Alertness: Awake/alert Behavior During Therapy: WFL for tasks assessed/performed Overall Cognitive Status: Impaired/Different from baseline Area of Impairment: Following commands;Problem solving;Safety/judgement                       Following Commands: Follows  one step commands with increased time;Follows multi-step commands with increased time;Follows multi-step commands inconsistently Safety/Judgement: Decreased awareness of safety   Problem Solving: Slow processing;Requires verbal cues General Comments: Increased time and poor safety. Very HOH and requiring cues repeated.   General Comments  BP 147/85 (101). Rest: SPO2 96% on 4L; dropped to 86% on 2L at rest. HR 101. RR 18-22. Standing: SpO2 90-87% on 4L, HR 120-130s, RR 30s.    Exercises Exercises: Other exercises Other Exercises Other Exercises: IS; 255ml pull. x10 Other Exercises: BLE "kicks" while sitting in recliner; 10x   Shoulder Instructions      Home Living Family/patient expects to be discharged to:: Private residence Living Arrangements: Alone Available Help at Discharge: Family;Available PRN/intermittently Type of Home: House Home Access: Ramped entrance     Home Layout: One level     Bathroom Shower/Tub: Occupational psychologist: Standard     Home Equipment: Environmental consultant - 2 wheels          Prior Functioning/Environment Level of Independence: Independent with assistive device(s)        Comments: uses RW "as needed". Reports he perfroms ADLs, IADLs,and drives        OT Problem List: Decreased strength;Decreased range of motion;Decreased activity tolerance;Impaired balance (sitting and/or standing);Decreased cognition;Decreased safety awareness;Decreased knowledge of use of DME or AE;Decreased knowledge of precautions;Cardiopulmonary status limiting activity      OT Treatment/Interventions: Self-care/ADL training;Therapeutic exercise;Energy conservation;DME and/or AE instruction;Therapeutic activities;Patient/family education    OT Goals(Current goals can be found in the care plan section) Acute Rehab OT Goals Patient Stated Goal: to go home OT Goal Formulation: With patient Time For Goal Achievement: 08/01/20 Potential to Achieve Goals: Good  OT  Frequency: Min 2X/week   Barriers to D/C:            Co-evaluation              AM-PAC OT "6 Clicks" Daily Activity     Outcome Measure Help from another person eating meals?: A Little Help from another person taking care of personal grooming?: A Little Help from another person toileting, which includes using toliet, bedpan, or urinal?: A Little Help from another person bathing (including washing, rinsing, drying)?: A Little Help from another person to put on and taking off regular upper body clothing?: A Little Help from another person to put on and taking off regular lower body clothing?: A Little 6 Click Score: 18   End of Session Equipment Utilized During Treatment: Rolling walker;Oxygen (4L) Nurse Communication: Mobility status  Activity Tolerance: Patient tolerated treatment well Patient left: in chair;with call bell/phone within reach;with chair alarm set  OT Visit Diagnosis: Unsteadiness on feet (R26.81);Other abnormalities of gait and mobility (R26.89);Muscle weakness (generalized) (M62.81)                Time: 6948-5462 OT Time Calculation (min): 37 min Charges:  OT General Charges $OT Visit: 1 Visit OT Evaluation $OT Eval Moderate Complexity: 1 Mod OT Treatments $Self Care/Home Management : 8-22 mins  Belva Koziel MSOT, OTR/L Acute Rehab Pager: [phone] Office: [place] 07/18/2020, 6:02 PM

## 2020-07-18 NOTE — Progress Notes (Signed)
OT Cancellation Note  Patient Details Name: Elena Cothern MRN: [id number] DOB: 1950/11/18   Cancelled Treatment:    Reason Eval/Treat Not Completed: Other (comment) (Just got OOB to recliner with nursing staff. SpO2 dropped per RN. Needs rest break. Will return as schedule allows. Thank you.)  [place], OTR/L Acute Rehab Pager: [phone] Office: [phone] 07/18/2020, 10:50 AM

## 2020-07-18 NOTE — Progress Notes (Signed)
[facility] night shift telemetry coverage note..  The patient's glucose level has been trending up and he no longer has symptoms of hypoglycemia.  CBG monitoring was changed from every 2 to every 4 hours.  Tennis Must, MD.

## 2020-07-19 DIAGNOSIS — G9341 Metabolic encephalopathy: Secondary | ICD-10-CM | POA: Diagnosis not present

## 2020-07-19 LAB — CBC WITH DIFFERENTIAL/PLATELET
Abs Immature Granulocytes: 0.04 10*3/uL (ref 0.00–0.07)
Basophils Absolute: 0 10*3/uL (ref 0.0–0.1)
Basophils Relative: 0 %
Eosinophils Absolute: 0 10*3/uL (ref 0.0–0.5)
Eosinophils Relative: 0 %
HCT: 40.6 % (ref 39.0–52.0)
Hemoglobin: 13.6 g/dL (ref 13.0–17.0)
Immature Granulocytes: 0 %
Lymphocytes Relative: 5 %
Lymphs Abs: 0.6 10*3/uL — ABNORMAL LOW (ref 0.7–4.0)
MCH: 26.6 pg (ref 26.0–34.0)
MCHC: 33.5 g/dL (ref 30.0–36.0)
MCV: 79.5 fL — ABNORMAL LOW (ref 80.0–100.0)
Monocytes Absolute: 0.2 10*3/uL (ref 0.1–1.0)
Monocytes Relative: 2 %
Neutro Abs: 11.1 10*3/uL — ABNORMAL HIGH (ref 1.7–7.7)
Neutrophils Relative %: 93 %
Platelets: 338 10*3/uL (ref 150–400)
RBC: 5.11 MIL/uL (ref 4.22–5.81)
RDW: 13.7 % (ref 11.5–15.5)
WBC: 11.9 10*3/uL — ABNORMAL HIGH (ref 4.0–10.5)
nRBC: 0 % (ref 0.0–0.2)

## 2020-07-19 LAB — PHOSPHORUS: Phosphorus: 4.3 mg/dL (ref 2.5–4.6)

## 2020-07-19 LAB — COMPREHENSIVE METABOLIC PANEL
ALT: 20 U/L (ref 0–44)
AST: 28 U/L (ref 15–41)
Albumin: 2.3 g/dL — ABNORMAL LOW (ref 3.5–5.0)
Alkaline Phosphatase: 61 U/L (ref 38–126)
Anion gap: 11 (ref 5–15)
BUN: 21 mg/dL (ref 8–23)
CO2: 24 mmol/L (ref 22–32)
Calcium: 8 mg/dL — ABNORMAL LOW (ref 8.9–10.3)
Chloride: 96 mmol/L — ABNORMAL LOW (ref 98–111)
Creatinine, Ser: 1.18 mg/dL (ref 0.61–1.24)
GFR, Estimated: >60 mL/min (ref >60)
Glucose, Bld: 200 mg/dL — ABNORMAL HIGH (ref 70–99)
Potassium: 4.5 mmol/L (ref 3.5–5.1)
Sodium: 131 mmol/L — ABNORMAL LOW (ref 135–145)
Total Bilirubin: 1 mg/dL (ref 0.3–1.2)
Total Protein: 6.8 g/dL (ref 6.5–8.1)

## 2020-07-19 LAB — MAGNESIUM: Magnesium: 1.8 mg/dL (ref 1.7–2.4)

## 2020-07-19 LAB — GLUCOSE, CAPILLARY
Glucose-Capillary: 186 mg/dL — ABNORMAL HIGH (ref 70–99)
Glucose-Capillary: 191 mg/dL — ABNORMAL HIGH (ref 70–99)
Glucose-Capillary: 194 mg/dL — ABNORMAL HIGH (ref 70–99)
Glucose-Capillary: 209 mg/dL — ABNORMAL HIGH (ref 70–99)
Glucose-Capillary: 220 mg/dL — ABNORMAL HIGH (ref 70–99)
Glucose-Capillary: 231 mg/dL — ABNORMAL HIGH (ref 70–99)

## 2020-07-19 LAB — D-DIMER, QUANTITATIVE: D-Dimer, Quant: 2.02 ug/mL-FEU — ABNORMAL HIGH (ref 0.00–0.50)

## 2020-07-19 LAB — C-REACTIVE PROTEIN: CRP: 13.9 mg/dL — ABNORMAL HIGH (ref <1.0)

## 2020-07-19 MED ORDER — INSULIN GLARGINE 100 UNIT/ML ~~LOC~~ SOLN
10.0000 [IU] | Freq: Every day | SUBCUTANEOUS | Status: DC
  Administered 2020-07-19 – 2020-07-20 (×2): 10 [IU] via SUBCUTANEOUS
  Filled 2020-07-19 (×4): qty 0.1

## 2020-07-19 NOTE — Progress Notes (Signed)
PROGRESS NOTE  Louis Stone  DOB: 1951/02/28  PCP: Theotis Burrow, MD WJX:[id number]  DOA: 07/16/2020  LOS: 3 days   Chief Complaint  Patient presents with  . Code Stroke   Brief narrative: Louis Stone is a 70 y.o. male with PMH significant for DM2, HTN, HLD, GERD, oropharyngeal cancer. Patient was brought to the ED on 1/12 by EMS after family found him slumped on the chair confused and lethargic.   EMS noted him hypoglycemic to 48.  There was also concern of possible left-sided weakness with dysarthria and patient was transported to [facility] ED for concern of acute stroke. Of note, patient was living with his girlfriend, in which she passed away last week.  And per family's report, since he has been not feeling well with poor oral intake.  In the ED, patient was hemodynamically stable. COVID PCR positive Chest x-ray showed heterogeneous areas coarse interstitial and patchy opacities throughout both lungs consistent with multifocal infection. CT head without contrast with asymmetric blurring left frontotemporal gray-white junctions which may reflect artifact versus acute insult.  Patient was seen by neurology, Dr. Erlinda Hong; who recommended against tPA administration given his last known normal is not well-established.   Patient was admitted under hospitalist service   Subjective: Patient was seen and examined this morning.  Elderly African-American male.  Lying in bed.  Not in distress.  Remains on 4 L oxygen by nasal cannula.  Assessment/Plan: Acute metabolic encephalopathy, POA -Brought into the hospital with confusion, possible focal neurological deficit.  -Initially concern for CVA.  Neurology evaluation was obtained.   CT head without contrast with area of possible concern of acute insult left frontotemporal gray-white junction.  MR brain with no acute intracranial findings but notes chronic small vessel ischemia.  CT angiogram head/neck with no large vessel  occlusion.  -It was ultimately determined that patient's altered mental status was due to hypoglycemia, coexisting COVID-19 infection, poor oral intake.  -PT/OT/ST eval obtained.  COVID pneumonia Acute respiratory failure with hypoxia  -Presented with lethargy -COVID test: PCR positive -Chest x-ray showed heterogeneous areas coarse interstitial and patchy opacities throughout both lungs consistent with multifocal infection. -Treatment: On a 5-day course of IV remdesivir till 1/17.  Also on Solu-Medrol 40 mg IV twice daily.  CRP doubled to 14 today but clinically patient is stable.  I will continue the same treatment plan for now. -Oxygen - SpO2: 96 % O2 Flow Rate (L/min): 2 L/min -Supportive care: Vitamin C, Zinc, PRN inhalers, Tylenol, Antitussives (benzonatate/ Mucinex/Tussionex).   -Encouraged incentive spirometry, prone position, out of bed and early mobilization as much as possible -Continue airborne/contact isolation precautions for duration of 3 weeks from the day of diagnosis. -WBC and inflammatory markers trend as below. Recent Labs  Lab 07/16/20 1538 07/16/20 2127 07/18/20 0500 07/19/20 0317  WBC 7.4  --  7.6 11.9*  LATICACIDVEN  --  1.0  --   --   DDIMER  --   --  2.69* 2.02*  CRP  --   --  7.3* 13.9*  ALT 21  --  19 20   The treatment plan and use of medications and known side effects were discussed with patient/family. Some of the medications used are based on case reports/anecdotal data.  All other medications being used in the management of COVID-19 based on limited study data.  Complete risks and long-term side effects are unknown, however in the best clinical judgment they seem to be of some benefit.  Patient wanted to  proceed with treatment options provided.  Dysphagia -Speech therapy evaluation ordered..  Hypoglycemia Type 2 diabetes mellitus -A1c 6.3 on 1/13.   -Home regimen includes Toujeo 25 units subcutaneously daily.  Patient was found to have a blood  sugar of 48 on EMS arrival.  He was started on D10 drip and sliding scale insulin. -He is currently not on any long-acting insulin.  He has poor oral intake but blood sugar level creeping up, close to 200 this morning.  -I will start him on 10 units of Lantus from today.  Continue sliding scale insulin with Accu-Cheks. Recent Labs  Lab 07/18/20 2005 07/19/20 0023 07/19/20 0401 07/19/20 0900 07/19/20 1229  GLUCAP 195* 209* 191* 194* 220*   Hypokalemia/hypophosphatemia -Levels improved with replacement. Recent Labs  Lab 07/16/20 1550 07/17/20 0038 07/17/20 0509 07/18/20 0500 07/19/20 0317  K 3.2* 3.4* 3.4* 3.3* 4.5  MG  --   --  2.3 1.9 1.8  PHOS  --   --   --  2.1* 4.3   Essential hypertension -Resume lisinopril.  Continue hydralazine IV as needed.    Hyperlipidemia -Continue aspirin and statin  GERD -On Protonix 40 mg p.o. daily, Pepcid 40 mg daily, and Carafate 1 g p.o. 4 times daily at home. -Resume oral meds.  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  Last seen by medical oncology, Dr. Rogue Bussing at [facility] 09/11/2017.  CT angiogram head/neck on admission shows asymmetric echogenicity left lateral aspect tongue and nonenlarged prominent level 1 submental lymph node. -Outpatient follow-up with oncology  Mobility: PT/OT eval obtained.  SNF recommended. Code Status:   Code Status: Full Code  Nutritional status: There is no height or weight on file to calculate BMI.     Diet Order            DIET - DYS 1 Room service appropriate? Yes; Fluid consistency: Thin  Diet effective now                 DVT prophylaxis: enoxaparin (LOVENOX) injection 40 mg Start: 07/17/20 1000   Antimicrobials:  None Fluid: Not on IV fluid Consultants: Neurology Family Communication:  Not at bedside  Status is: Inpatient  Remains inpatient appropriate because -ongoing management for  COVID-19 pneumonia  Dispo:  Patient From: Home  Planned Disposition: To be determined  Expected discharge date: 1/17  medically stable for discharge: No        Infusions:  . remdesivir 100 mg in NS 100 mL 100 mg (07/19/20 1020)    Scheduled Meds: .  stroke: mapping our early stages of recovery book   Does not apply Once  . vitamin C  500 mg Oral Daily  . aspirin EC  81 mg Oral Daily  . atorvastatin  20 mg Oral QHS  . enoxaparin (LOVENOX) injection  40 mg Subcutaneous Daily  . insulin glargine  10 Units Subcutaneous Daily  . lisinopril  5 mg Oral Daily  . methylPREDNISolone (SOLU-MEDROL) injection  40 mg Intravenous Q12H  . pantoprazole  40 mg Oral Daily  . zinc sulfate  220 mg Oral Daily    Antimicrobials: Anti-infectives (From admission, onward)   Start     Dose/Rate Route Frequency Ordered Stop   07/19/20 1000  remdesivir 100 mg in sodium chloride 0.9 % 100 mL IVPB       "Followed by" Linked Group Details   100 mg 200 mL/hr over 30 Minutes Intravenous Daily 07/18/20 1602  07/22/20 0959   07/18/20 1000  remdesivir 100 mg in sodium chloride 0.9 % 100 mL IVPB  Status:  Discontinued       "Followed by" Linked Group Details   100 mg 200 mL/hr over 30 Minutes Intravenous Daily 07/17/20 0936 07/18/20 1602   07/17/20 1030  remdesivir 200 mg in sodium chloride 0.9% 250 mL IVPB       "Followed by" Linked Group Details   200 mg 580 mL/hr over 30 Minutes Intravenous Once 07/17/20 0936 07/17/20 1724      PRN meds: acetaminophen **OR** acetaminophen (TYLENOL) oral liquid 160 mg/5 mL **OR** acetaminophen, albuterol, benzonatate, chlorpheniramine-HYDROcodone, guaiFENesin-dextromethorphan, hydrALAZINE, ondansetron (ZOFRAN) IV   Objective: Vitals:   07/19/20 0902 07/19/20 1233  BP: (!) 146/92 92/66  Pulse: (!) 110 74  Resp: 20 16  Temp: 98.4 F (36.9 C) 97.9 F (36.6 C)  SpO2: 93% 96%    Intake/Output Summary (Last 24 hours) at 07/19/2020 1334 Last data filed at  07/19/2020 1020 Gross per 24 hour  Intake 150 ml  Output 900 ml  Net -750 ml   There were no vitals filed for this visit. Weight change:  There is no height or weight on file to calculate BMI.   Physical Exam: General exam: Thin built elderly African-American male.  Not in physical distress Skin: No rashes, lesions or ulcers. HEENT: Atraumatic, normocephalic, no obvious bleeding Lungs: Poor respiratory effort, on 4 L oxygen nasal cannula. CVS: Regular rate and rhythm, no murmur GI/Abd soft and nontender, nondistended, bowel sounds present CNS: Hard of hearing but oriented to place and person Psychiatry: Depressed look Extremities: No pedal edema, no calf tenderness  Data Review: I have personally reviewed the laboratory data and studies available.  Recent Labs  Lab 07/16/20 1538 07/16/20 1550 07/17/20 0038 07/18/20 0500 07/19/20 0317  WBC 7.4  --   --  7.6 11.9*  NEUTROABS 6.5  --   --   --  11.1*  HGB 13.3 14.6 13.6 13.2 13.6  HCT 43.5 43.0 40.0 40.2 40.6  MCV 84.3  --   --  80.7 79.5*  PLT 328  --   --  313 338   Recent Labs  Lab 07/16/20 1538 07/16/20 1550 07/17/20 0038 07/17/20 0509 07/18/20 0500 07/19/20 0317  NA 141 143 144 141 133* 131*  K 3.3* 3.2* 3.4* 3.4* 3.3* 4.5  CL 103 104  --  104 98 96*  CO2 25  --   --  24 27 24   GLUCOSE 60* 58*  --  67* 123* 200*  BUN 33* 34*  --  25* 15 21  CREATININE 1.17 1.10  --  0.85 0.93 1.18  CALCIUM 8.7*  --   --  8.6* 8.3* 8.0*  MG  --   --   --  2.3 1.9 1.8  PHOS  --   --   --   --  2.1* 4.3    F/u labs ordered  Signed, Terrilee Croak, MD [facility] 07/19/2020

## 2020-07-20 DIAGNOSIS — G9341 Metabolic encephalopathy: Secondary | ICD-10-CM | POA: Diagnosis not present

## 2020-07-20 LAB — CBC WITH DIFFERENTIAL/PLATELET
Abs Immature Granulocytes: 0.07 10*3/uL (ref 0.00–0.07)
Basophils Absolute: 0 10*3/uL (ref 0.0–0.1)
Basophils Relative: 0 %
Eosinophils Absolute: 0 10*3/uL (ref 0.0–0.5)
Eosinophils Relative: 0 %
HCT: 36.9 % — ABNORMAL LOW (ref 39.0–52.0)
Hemoglobin: 12.6 g/dL — ABNORMAL LOW (ref 13.0–17.0)
Immature Granulocytes: 1 %
Lymphocytes Relative: 4 %
Lymphs Abs: 0.5 10*3/uL — ABNORMAL LOW (ref 0.7–4.0)
MCH: 26.9 pg (ref 26.0–34.0)
MCHC: 34.1 g/dL (ref 30.0–36.0)
MCV: 78.8 fL — ABNORMAL LOW (ref 80.0–100.0)
Monocytes Absolute: 0.6 10*3/uL (ref 0.1–1.0)
Monocytes Relative: 5 %
Neutro Abs: 10.4 10*3/uL — ABNORMAL HIGH (ref 1.7–7.7)
Neutrophils Relative %: 90 %
Platelets: 325 10*3/uL (ref 150–400)
RBC: 4.68 MIL/uL (ref 4.22–5.81)
RDW: 14 % (ref 11.5–15.5)
WBC: 11.6 10*3/uL — ABNORMAL HIGH (ref 4.0–10.5)
nRBC: 0 % (ref 0.0–0.2)

## 2020-07-20 LAB — COMPREHENSIVE METABOLIC PANEL
ALT: 23 U/L (ref 0–44)
AST: 29 U/L (ref 15–41)
Albumin: 2.2 g/dL — ABNORMAL LOW (ref 3.5–5.0)
Alkaline Phosphatase: 56 U/L (ref 38–126)
Anion gap: 9 (ref 5–15)
BUN: 38 mg/dL — ABNORMAL HIGH (ref 8–23)
CO2: 24 mmol/L (ref 22–32)
Calcium: 8 mg/dL — ABNORMAL LOW (ref 8.9–10.3)
Chloride: 98 mmol/L (ref 98–111)
Creatinine, Ser: 1.19 mg/dL (ref 0.61–1.24)
GFR, Estimated: >60 mL/min (ref >60)
Glucose, Bld: 274 mg/dL — ABNORMAL HIGH (ref 70–99)
Potassium: 4.7 mmol/L (ref 3.5–5.1)
Sodium: 131 mmol/L — ABNORMAL LOW (ref 135–145)
Total Bilirubin: 0.3 mg/dL (ref 0.3–1.2)
Total Protein: 6.1 g/dL — ABNORMAL LOW (ref 6.5–8.1)

## 2020-07-20 LAB — HEMOGLOBIN A1C
Hgb A1c MFr Bld: 6.6 % — ABNORMAL HIGH (ref 4.8–5.6)
Mean Plasma Glucose: 142.72 mg/dL

## 2020-07-20 LAB — GLUCOSE, CAPILLARY
Glucose-Capillary: 200 mg/dL — ABNORMAL HIGH (ref 70–99)
Glucose-Capillary: 222 mg/dL — ABNORMAL HIGH (ref 70–99)
Glucose-Capillary: 232 mg/dL — ABNORMAL HIGH (ref 70–99)
Glucose-Capillary: 233 mg/dL — ABNORMAL HIGH (ref 70–99)
Glucose-Capillary: 243 mg/dL — ABNORMAL HIGH (ref 70–99)
Glucose-Capillary: 245 mg/dL — ABNORMAL HIGH (ref 70–99)
Glucose-Capillary: 288 mg/dL — ABNORMAL HIGH (ref 70–99)

## 2020-07-20 LAB — D-DIMER, QUANTITATIVE: D-Dimer, Quant: 2.18 ug/mL-FEU — ABNORMAL HIGH (ref 0.00–0.50)

## 2020-07-20 LAB — C-REACTIVE PROTEIN: CRP: 9.7 mg/dL — ABNORMAL HIGH (ref <1.0)

## 2020-07-20 MED ORDER — INSULIN GLARGINE 100 UNIT/ML ~~LOC~~ SOLN
15.0000 [IU] | Freq: Every day | SUBCUTANEOUS | Status: DC
  Administered 2020-07-21: 15 [IU] via SUBCUTANEOUS
  Filled 2020-07-20: qty 0.15

## 2020-07-20 MED ORDER — INSULIN ASPART 100 UNIT/ML ~~LOC~~ SOLN
0.0000 [IU] | Freq: Every day | SUBCUTANEOUS | Status: DC
  Administered 2020-07-20: 2 [IU] via SUBCUTANEOUS

## 2020-07-20 MED ORDER — INSULIN ASPART 100 UNIT/ML ~~LOC~~ SOLN
0.0000 [IU] | Freq: Three times a day (TID) | SUBCUTANEOUS | Status: DC
  Administered 2020-07-20: 2 [IU] via SUBCUTANEOUS
  Administered 2020-07-20: 3 [IU] via SUBCUTANEOUS
  Administered 2020-07-20: 5 [IU] via SUBCUTANEOUS

## 2020-07-20 MED ORDER — PREDNISONE 20 MG PO TABS
40.0000 mg | ORAL_TABLET | Freq: Every day | ORAL | Status: DC
  Administered 2020-07-20 – 2020-07-21 (×2): 40 mg via ORAL
  Filled 2020-07-20 (×3): qty 2

## 2020-07-20 NOTE — Progress Notes (Signed)
PROGRESS NOTE  Louis Stone  DOB: Dec 23, 1950  PCP: Theotis Burrow, MD GQQ:[id number]  DOA: 07/16/2020  LOS: 4 days   Chief Complaint  Patient presents with  . Code Stroke   Brief narrative: Louis Stone is a 70 y.o. male with PMH significant for DM2, HTN, HLD, GERD, oropharyngeal cancer. Patient was brought to the ED on 1/12 by EMS after family found him slumped on the chair confused and lethargic.   EMS noted him hypoglycemic to 48.  There was also concern of possible left-sided weakness with dysarthria and patient was transported to [facility] ED for concern of acute stroke. Of note, patient was living with his girlfriend, in which she passed away last week.  And per family's report, since he has been not feeling well with poor oral intake.  In the ED, patient was hemodynamically stable. COVID PCR positive Chest x-ray showed heterogeneous areas coarse interstitial and patchy opacities throughout both lungs consistent with multifocal infection. CT head without contrast with asymmetric blurring left frontotemporal gray-white junctions which may reflect artifact versus acute insult.  Patient was seen by neurology, Dr. Erlinda Hong; who recommended against tPA administration given his last known normal is not well-established.   Patient was admitted under hospitalist service   Subjective: Patient was seen and examined this morning.   Elderly African-American male.   Sitting up in chair.  Not in distress.  Not on supplemental oxygen today.  Assessment/Plan: Acute metabolic encephalopathy, POA -Brought into the hospital with confusion, possible focal neurological deficit.  -Initially concerned for CVA.  Neurology evaluation was obtained.   CT head without contrast with area of possible concern of acute insult left frontotemporal gray-white junction.  MR brain with no acute intracranial findings but notes chronic small vessel ischemia.  CT angiogram head/neck with no large  vessel occlusion.  -It was ultimately determined that patient's altered mental status was due to hypoglycemia, coexisting COVID-19 infection, poor oral intake.  -PT/OT/ST eval obtained.  SNF versus home health PT recommended.  COVID pneumonia Acute respiratory failure with hypoxia  -Presented with lethargy -COVID test: PCR positive -Chest x-ray showed heterogeneous areas coarse interstitial and patchy opacities throughout both lungs consistent with multifocal infection. -Treatment: On a 5-day course of IV remdesivir till 1/17.  Also on Solu-Medrol 40 mg IV twice daily.  CRP improving.  Oxygen requirement has significantly improved.  Will reduce steroids to prednisone 40 mg daily.   -Oxygen - SpO2: 93 % O2 Flow Rate (L/min): 2 L/min -Supportive care: Vitamin C, Zinc, PRN inhalers, Tylenol, Antitussives (benzonatate/ Mucinex/Tussionex).   -Encouraged incentive spirometry, prone position, out of bed and early mobilization as much as possible -Continue airborne/contact isolation precautions for duration of 3 weeks from the day of diagnosis. -WBC and inflammatory markers trend as below. Recent Labs  Lab 07/16/20 1538 07/16/20 2127 07/18/20 0500 07/19/20 0317 07/20/20 0325  WBC 7.4  --  7.6 11.9* 11.6*  LATICACIDVEN  --  1.0  --   --   --   DDIMER  --   --  2.69* 2.02* 2.18*  CRP  --   --  7.3* 13.9* 9.7*  ALT 21  --  19 20 23    The treatment plan and use of medications and known side effects were discussed with patient/family. Some of the medications used are based on case reports/anecdotal data.  All other medications being used in the management of COVID-19 based on limited study data.  Complete risks and long-term side effects are unknown, however in  the best clinical judgment they seem to be of some benefit.  Patient wanted to proceed with treatment options provided.  Dysphagia -Speech therapy evaluation obtained.  Dysphagia 1 pured diet ordered.  Hypoglycemia Type 2 diabetes  mellitus -A1c 6.3 on 1/13.   -Blood sugar level is currently over 200. -Home regimen includes Toujeo 25 units subcutaneously daily.  Patient was found to have a blood sugar of 48 on EMS arrival.  He was started on D10 drip and sliding scale insulin. -He is currently on Lantus 10 units daily.  Will increase to Lantus 15 units daily.  Continue sliding scale insulin with Accu-Cheks. Recent Labs  Lab 07/19/20 2034 07/20/20 0040 07/20/20 0440 07/20/20 0841 07/20/20 1204  GLUCAP 231* 232* 222* 200* 288*   Hypokalemia/hypophosphatemia -Levels improved with replacement. Recent Labs  Lab 07/17/20 0038 07/17/20 0509 07/18/20 0500 07/19/20 0317 07/20/20 0325  K 3.4* 3.4* 3.3* 4.5 4.7  MG  --  2.3 1.9 1.8  --   PHOS  --   --  2.1* 4.3  --    Essential hypertension -Resume lisinopril.  Continue hydralazine IV as needed.    Hyperlipidemia -Continue aspirin and statin  GERD -On Protonix 40 mg p.o. daily, Pepcid 40 mg daily, and Carafate 1 g p.o. 4 times daily at home. -Resume oral meds.  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  Last seen by medical oncology, Dr. Rogue Bussing at [facility] 09/11/2017.  CT angiogram head/neck on admission shows asymmetric echogenicity left lateral aspect tongue and nonenlarged prominent level 1 submental lymph node. -Outpatient follow-up with oncology  Mobility: PT/OT eval obtained.  SNF recommended. Code Status:   Code Status: Full Code  Nutritional status: There is no height or weight on file to calculate BMI.     Diet Order            DIET - DYS 1 Room service appropriate? Yes; Fluid consistency: Thin  Diet effective now                 DVT prophylaxis: enoxaparin (LOVENOX) injection 40 mg Start: 07/17/20 1000   Antimicrobials:  None Fluid: Not on IV fluid Consultants: Neurology Family Communication:  Not at bedside  Status is:  Inpatient  Remains inpatient appropriate because -ongoing management for COVID-19 pneumonia  Dispo:  Patient From: Home  Planned Disposition: To be determined  Expected discharge date: 1/17  medically stable for discharge: No   Infusions:  . remdesivir 100 mg in NS 100 mL 100 mg (07/20/20 1000)    Scheduled Meds: .  stroke: mapping our early stages of recovery book   Does not apply Once  . vitamin C  500 mg Oral Daily  . aspirin EC  81 mg Oral Daily  . atorvastatin  20 mg Oral QHS  . enoxaparin (LOVENOX) injection  40 mg Subcutaneous Daily  . insulin aspart  0-5 Units Subcutaneous QHS  . insulin aspart  0-9 Units Subcutaneous TID WC  . insulin glargine  10 Units Subcutaneous Daily  . lisinopril  5 mg Oral Daily  . pantoprazole  40 mg Oral Daily  . predniSONE  40 mg Oral Q breakfast  . zinc sulfate  220 mg Oral Daily    Antimicrobials: Anti-infectives (From admission, onward)   Start     Dose/Rate Route Frequency Ordered Stop   07/19/20 1000  remdesivir 100 mg in sodium chloride 0.9 % 100 mL IVPB       "  Followed by" Linked Group Details   100 mg 200 mL/hr over 30 Minutes Intravenous Daily 07/18/20 1602 07/22/20 0959   07/18/20 1000  remdesivir 100 mg in sodium chloride 0.9 % 100 mL IVPB  Status:  Discontinued       "Followed by" Linked Group Details   100 mg 200 mL/hr over 30 Minutes Intravenous Daily 07/17/20 0936 07/18/20 1602   07/17/20 1030  remdesivir 200 mg in sodium chloride 0.9% 250 mL IVPB       "Followed by" Linked Group Details   200 mg 580 mL/hr over 30 Minutes Intravenous Once 07/17/20 0936 07/17/20 1724      PRN meds: acetaminophen **OR** acetaminophen (TYLENOL) oral liquid 160 mg/5 mL **OR** acetaminophen, albuterol, benzonatate, chlorpheniramine-HYDROcodone, guaiFENesin-dextromethorphan, hydrALAZINE, ondansetron (ZOFRAN) IV   Objective: Vitals:   07/20/20 0442 07/20/20 1210  BP: 100/61 (!) 107/56  Pulse:  68  Resp: 19 18  Temp: 97.9 F (36.6 C)  97.9 F (36.6 C)  SpO2: 96% 93%    Intake/Output Summary (Last 24 hours) at 07/20/2020 1333 Last data filed at 07/20/2020 1255 Gross per 24 hour  Intake 357 ml  Output 1025 ml  Net -668 ml   There were no vitals filed for this visit. Weight change:  There is no height or weight on file to calculate BMI.   Physical Exam: General exam: Thin built elderly African-American male.  Not in physical distress Skin: No rashes, lesions or ulcers. HEENT: Atraumatic, normocephalic, no obvious bleeding Lungs: Clear to auscultation bilaterally.  Not on supplemental oxygen CVS: Regular rate and rhythm, no murmur GI/Abd soft and nontender, nondistended, bowel sounds present CNS: Hard of hearing but oriented to place and person Psychiatry: Depressed look Extremities: No pedal edema, no calf tenderness  Data Review: I have personally reviewed the laboratory data and studies available.  Recent Labs  Lab 07/16/20 1538 07/16/20 1550 07/17/20 0038 07/18/20 0500 07/19/20 0317 07/20/20 0325  WBC 7.4  --   --  7.6 11.9* 11.6*  NEUTROABS 6.5  --   --   --  11.1* 10.4*  HGB 13.3 14.6 13.6 13.2 13.6 12.6*  HCT 43.5 43.0 40.0 40.2 40.6 36.9*  MCV 84.3  --   --  80.7 79.5* 78.8*  PLT 328  --   --  313 338 325   Recent Labs  Lab 07/16/20 1538 07/16/20 1550 07/17/20 0038 07/17/20 0509 07/18/20 0500 07/19/20 0317 07/20/20 0325  NA 141 143 144 141 133* 131* 131*  K 3.3* 3.2* 3.4* 3.4* 3.3* 4.5 4.7  CL 103 104  --  104 98 96* 98  CO2 25  --   --  24 27 24 24   GLUCOSE 60* 58*  --  67* 123* 200* 274*  BUN 33* 34*  --  25* 15 21 38*  CREATININE 1.17 1.10  --  0.85 0.93 1.18 1.19  CALCIUM 8.7*  --   --  8.6* 8.3* 8.0* 8.0*  MG  --   --   --  2.3 1.9 1.8  --   PHOS  --   --   --   --  2.1* 4.3  --     F/u labs ordered  Signed, Terrilee Croak, MD [facility] 07/20/2020

## 2020-07-21 DIAGNOSIS — G9341 Metabolic encephalopathy: Secondary | ICD-10-CM | POA: Diagnosis not present

## 2020-07-21 LAB — COMPREHENSIVE METABOLIC PANEL
ALT: 24 U/L (ref 0–44)
AST: 33 U/L (ref 15–41)
Albumin: 2.2 g/dL — ABNORMAL LOW (ref 3.5–5.0)
Alkaline Phosphatase: 55 U/L (ref 38–126)
Anion gap: 9 (ref 5–15)
BUN: 35 mg/dL — ABNORMAL HIGH (ref 8–23)
CO2: 24 mmol/L (ref 22–32)
Calcium: 8.2 mg/dL — ABNORMAL LOW (ref 8.9–10.3)
Chloride: 98 mmol/L (ref 98–111)
Creatinine, Ser: 1.02 mg/dL (ref 0.61–1.24)
GFR, Estimated: >60 mL/min (ref >60)
Glucose, Bld: 207 mg/dL — ABNORMAL HIGH (ref 70–99)
Potassium: 4.3 mmol/L (ref 3.5–5.1)
Sodium: 131 mmol/L — ABNORMAL LOW (ref 135–145)
Total Bilirubin: 0.5 mg/dL (ref 0.3–1.2)
Total Protein: 6.3 g/dL — ABNORMAL LOW (ref 6.5–8.1)

## 2020-07-21 LAB — CBC WITH DIFFERENTIAL/PLATELET
Abs Immature Granulocytes: 0.09 10*3/uL — ABNORMAL HIGH (ref 0.00–0.07)
Basophils Absolute: 0 10*3/uL (ref 0.0–0.1)
Basophils Relative: 0 %
Eosinophils Absolute: 0 10*3/uL (ref 0.0–0.5)
Eosinophils Relative: 0 %
HCT: 37.5 % — ABNORMAL LOW (ref 39.0–52.0)
Hemoglobin: 12 g/dL — ABNORMAL LOW (ref 13.0–17.0)
Immature Granulocytes: 1 %
Lymphocytes Relative: 6 %
Lymphs Abs: 0.6 10*3/uL — ABNORMAL LOW (ref 0.7–4.0)
MCH: 25.8 pg — ABNORMAL LOW (ref 26.0–34.0)
MCHC: 32 g/dL (ref 30.0–36.0)
MCV: 80.5 fL (ref 80.0–100.0)
Monocytes Absolute: 0.8 10*3/uL (ref 0.1–1.0)
Monocytes Relative: 7 %
Neutro Abs: 8.8 10*3/uL — ABNORMAL HIGH (ref 1.7–7.7)
Neutrophils Relative %: 86 %
Platelets: 313 10*3/uL (ref 150–400)
RBC: 4.66 MIL/uL (ref 4.22–5.81)
RDW: 13.8 % (ref 11.5–15.5)
WBC: 10.2 10*3/uL (ref 4.0–10.5)
nRBC: 0 % (ref 0.0–0.2)

## 2020-07-21 LAB — D-DIMER, QUANTITATIVE: D-Dimer, Quant: 1.74 ug/mL-FEU — ABNORMAL HIGH (ref 0.00–0.50)

## 2020-07-21 LAB — GLUCOSE, CAPILLARY
Glucose-Capillary: 113 mg/dL — ABNORMAL HIGH (ref 70–99)
Glucose-Capillary: 80 mg/dL (ref 70–99)
Glucose-Capillary: 211 mg/dL — ABNORMAL HIGH (ref 70–99)

## 2020-07-21 LAB — C-REACTIVE PROTEIN: CRP: 5.7 mg/dL — ABNORMAL HIGH (ref <1.0)

## 2020-07-21 MED ORDER — PREDNISONE 20 MG PO TABS: 40.0000 mg | ORAL_TABLET | Freq: Every day | ORAL | 0 refills | Status: AC

## 2020-07-21 MED ORDER — ASPIRIN 81 MG PO TBEC: 81.0000 mg | DELAYED_RELEASE_TABLET | Freq: Every day | ORAL | 2 refills | Status: AC

## 2020-07-21 MED ORDER — TOUJEO SOLOSTAR 300 UNIT/ML ~~LOC~~ SOPN: 15.0000 [IU] | PEN_INJECTOR | Freq: Every day | SUBCUTANEOUS | Status: AC

## 2020-07-21 MED ORDER — GUAIFENESIN-DM 100-10 MG/5ML PO SYRP: 10.0000 mL | ORAL_SOLUTION | ORAL | 0 refills | Status: DC | PRN

## 2020-07-21 NOTE — Progress Notes (Signed)
OT Cancellation Note  Patient Details Name: Louis Stone MRN: [id number] DOB: Sep 17, 1950   Cancelled Treatment:    Reason Eval/Treat Not Completed: Other (comment) patient being d/c'd from hospital.   Gloris Manchester OTR/L Supplemental OT, Department of rehab services [phone]  Hasheem Voland R H. 07/21/2020, 1:15 PM

## 2020-07-21 NOTE — Discharge Summary (Signed)
Physician Discharge Summary  Louis Stone [id number] DOB: March 18, 1951 DOA: 07/16/2020  PCP: Theotis Burrow, MD  Admit date: 07/16/2020 Discharge date: 07/21/2020  Admitted From: Home Discharge disposition: Home with home health PT   Code Status: Full Code  Diet Recommendation: Cardiac/diabetic diet  Discharge Diagnosis:   Principal Problem:   Acute metabolic encephalopathy Active Problems:   Acute left-sided weakness   Mixed diabetic hyperlipidemia associated with type 2 diabetes mellitus ([name])   Uncontrolled type 2 diabetes mellitus with hypoglycemia without coma, with long-term current use of insulin ([name])   COVID-19 virus infection   GERD without esophagitis   Bradycardia   Chief Complaint  Patient presents with  . Code Stroke   Brief narrative: Louis Stone is a 70 y.o. male with PMH significant for DM2, HTN, HLD, GERD, oropharyngeal cancer. Patient was brought to the ED on 1/12 by EMS after family found him slumped on the chair confused and lethargic.   EMS noted him hypoglycemic to 48.  There was also concern of possible left-sided weakness with dysarthria and patient was transported to [facility] ED for concern of acute stroke. Of note, patient was living with his girlfriend, in which she passed away last week.  And per family's report, since he has been not feeling well with poor oral intake.  In the ED, patient was hemodynamically stable. COVID PCR positive Chest x-ray showed heterogeneous areas coarse interstitial and patchy opacities throughout both lungs consistent with multifocal infection. CT head without contrast with asymmetric blurring left frontotemporal gray-white junctions which may reflect artifact versus acute insult.  Patient was seen by neurology, Dr. Erlinda Hong; who recommended against tPA administration given his last known normal is not well-established.   Patient was admitted under hospitalist service   Subjective: Patient was  seen and examined this morning.   Sitting up in bed.  Not in distress not on supplemental oxygen.  Hospital course: Acute metabolic encephalopathy, POA -Brought into the hospital with confusion, possible focal neurological deficit.  -Initially concerned for CVA.  Neurology evaluation was obtained.   CT head without contrast with area of possible concern of acute insult left frontotemporal gray-white junction.  MR brain with no acute intracranial findings but notes chronic small vessel ischemia.  CT angiogram head/neck with no large vessel occlusion.  Started on aspirin 81 mg daily. -It was ultimately determined that patient's altered mental status was due to hypoglycemia, coexisting COVID-19 infection, poor oral intake.  -PT/OT/ST eval obtained.  SNF versus home health PT recommended.  Patient chooses to go home with PT.   COVID pneumonia Acute respiratory failure with hypoxia  -Presented with lethargy -COVID test: PCR positive -Chest x-ray showed heterogeneous areas coarse interstitial and patchy opacities throughout both lungs consistent with multifocal infection. -Treatment: Completed a 5-day course of IV remdesivir today 1/17.  Also on prednisone 40 mg daily.  Oxygenation improved.  Not on supplemental oxygen.  CRP improving.  - Continue prednisone 40 mg daily for next 5 days at home. -Supportive care: Vitamin C, Zinc, PRN inhalers, Tylenol, Antitussives (benzonatate/ Mucinex/Tussionex).   -Encouraged incentive spirometry, prone position, out of bed and early mobilization as much as possible -Continue airborne/contact isolation precautions for duration of 3 weeks from the day of diagnosis. -WBC and inflammatory markers trend as below. Recent Labs  Lab 07/16/20 1538 07/16/20 2127 07/18/20 0500 07/19/20 0317 07/20/20 0325 07/21/20 0024  WBC 7.4  --  7.6 11.9* 11.6* 10.2  LATICACIDVEN  --  1.0  --   --   --   --  DDIMER  --   --  2.69* 2.02* 2.18* 1.74*  CRP  --   --  7.3* 13.9*  9.7* 5.7*  ALT 21  --  19 20 23 24    The treatment plan and use of medications and known side effects were discussed with patient/family. Some of the medications used are based on case reports/anecdotal data.  All other medications being used in the management of COVID-19 based on limited study data.  Complete risks and long-term side effects are unknown, however in the best clinical judgment they seem to be of some benefit.  Patient wanted to proceed with treatment options provided.  Dysphagia -Speech therapy evaluation obtained.  Dysphagia 1 pured diet ordered.  Hypoglycemia Type 2 diabetes mellitus -A1c 6.3 on 1/13.   -Home regimen includes Toujeo 25 units subcutaneously daily.  Patient was found to have a blood sugar of 48 on EMS arrival.  His blood sugar level was carefully monitored in the hospital.  Currently he is blood sugar level is stable on Lantus 15 units daily.  We will discharge him to continue to have at home at a reduced dose of 15 units daily.   Recent Labs  Lab 07/20/20 1643 07/20/20 2018 07/20/20 2319 07/21/20 0502 07/21/20 0840  GLUCAP 233* 243* 245* 113* 80   Hypokalemia/hypophosphatemia -Levels improved with replacement. Recent Labs  Lab 07/17/20 0509 07/18/20 0500 07/19/20 0317 07/20/20 0325 07/21/20 0024  K 3.4* 3.3* 4.5 4.7 4.3  MG 2.3 1.9 1.8  --   --   PHOS  --  2.1* 4.3  --   --    Essential hypertension -Continue lisinopril.    Hyperlipidemia -Continue aspirin and statin  GERD -On Protonix 40 mg p.o. daily, Pepcid 40 mg daily, and Carafate 1 g p.o. 4 times daily at home. -Resume oral meds.  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  Last seen by medical oncology, Dr. Rogue Bussing at [facility] 09/11/2017.  CT angiogram head/neck on admission shows asymmetric echogenicity left lateral aspect tongue and nonenlarged prominent level 1 submental lymph  node. -Outpatient follow-up with oncology  Stable for discharge home today with home health PT.  Wound care:    Discharge Exam:   Vitals:   07/20/20 2325 07/21/20 0100 07/21/20 0500 07/21/20 0800  BP: (!) 97/58 124/75 120/69 122/71  Pulse: (!) 58 (!) 50 (!) 44 (!) 50  Resp: 16     Temp: 98.2 F (36.8 C)  (!) 97 F (36.1 C) 98.2 F (36.8 C)  TempSrc: Oral  Axillary Oral  SpO2: 95% 95% 95% 93%    There is no height or weight on file to calculate BMI.  General exam: Pleasant elderly African-American male.  Not in distress.  Sitting up in bed.  Not on supplemental oxygen Skin: No rashes, lesions or ulcers. HEENT: Atraumatic, normocephalic, no obvious bleeding Lungs: Clear to auscultation bilaterally CVS: Regular rate and rhythm, no murmur GI/Abd soft, nontender, nondistended, bowel sound present CNS: Alert, awake, oriented x3 Psychiatry: Mood appropriate Extremities: No pedal edema, no calf tenderness  Follow ups:   Discharge Instructions    Diet - low sodium heart healthy   Complete by: As directed    Dysphagia 1 diet   Increase activity slowly   Complete by: As directed       Follow-up Information    Revelo, Elyse Jarvis, MD Follow up.   Specialty: Family Medicine Contact information: [place] [id number]  [place] [address] [phone]               Recommendations for Outpatient Follow-Up:   1. Follow-up with PCP as an outpatient  Discharge Instructions:  Follow with Primary MD Revelo, Elyse Jarvis, MD in 7 days   Get CBC/BMP checked in next visit within 1 week by PCP or SNF MD ( we routinely change or add medications that can affect your baseline labs and fluid status, therefore we recommend that you get the mentioned basic workup next visit with your PCP, your PCP may decide not to get them or add new tests based on their clinical decision)  On your next visit with your PCP, please Get Medicines reviewed and adjusted.  Please request  your PCP  to go over all Hospital Tests and Procedure/Radiological results at the follow up, please get all Hospital records sent to your Prim MD by signing hospital release before you go home.  Activity: As tolerated with Full fall precautions use walker/cane & assistance as needed  For Heart failure patients - Check your Weight same time everyday, if you gain over 2 pounds, or you develop in leg swelling, experience more shortness of breath or chest pain, call your Primary MD immediately. Follow Cardiac Low Salt Diet and 1.5 lit/day fluid restriction.  If you have smoked or chewed Tobacco in the last 2 yrs please stop smoking, stop any regular Alcohol  and or any Recreational drug use.  If you experience worsening of your admission symptoms, develop shortness of breath, life threatening emergency, suicidal or homicidal thoughts you must seek medical attention immediately by calling 911 or calling your MD immediately  if symptoms less severe.  You Must read complete instructions/literature along with all the possible adverse reactions/side effects for all the Medicines you take and that have been prescribed to you. Take any new Medicines after you have completely understood and accpet all the possible adverse reactions/side effects.   Do not drive, operate heavy machinery, perform activities at heights, swimming or participation in water activities or provide baby sitting services if your were admitted for syncope or siezures until you have seen by Primary MD or a Neurologist and advised to do so again.  Do not drive when taking Pain medications.  Do not take more than prescribed Pain, Sleep and Anxiety Medications  Wear Seat belts while driving.   Please note You were cared for by a hospitalist during your hospital stay. If you have any questions about your discharge medications or the care you received while you were in the hospital after you are discharged, you can call the unit and asked to  speak with the hospitalist on call if the hospitalist that took care of you is not available. Once you are discharged, your primary care physician will handle any further medical issues. Please note that NO REFILLS for any discharge medications will be authorized once you are discharged, as it is imperative that you return to your primary care physician (or establish a relationship with a primary care physician if you do not have one) for your aftercare needs so that they can reassess your need for medications and monitor your lab values.    Allergies as of 07/21/2020   No Known Allergies     Medication List    STOP taking these medications   meloxicam 7.5 MG tablet Commonly known as: MOBIC     TAKE these medications   acetaminophen 325 MG tablet Commonly known as: TYLENOL Take 650  mg by mouth every 6 (six) hours as needed for headache or mild pain.   albuterol 108 (90 Base) MCG/ACT inhaler Commonly known as: VENTOLIN HFA Inhale 2 puffs into the lungs every 6 (six) hours as needed for wheezing.   aspirin 81 MG EC tablet Take 1 tablet (81 mg total) by mouth daily. Start taking on: July 22, 2020   atorvastatin 20 MG tablet Commonly known as: LIPITOR Take 20 mg by mouth daily.   benzonatate 200 MG capsule Commonly known as: TESSALON Take 200 mg by mouth 3 (three) times daily as needed for cough.   famotidine 40 MG tablet Commonly known as: PEPCID Take 40 mg by mouth daily.   guaiFENesin-dextromethorphan 100-10 MG/5ML syrup Commonly known as: ROBITUSSIN DM Take 10 mLs by mouth every 4 (four) hours as needed for cough.   lisinopril 5 MG tablet Commonly known as: ZESTRIL Take 5 mg by mouth daily.   omeprazole 40 MG capsule Commonly known as: PRILOSEC Take 40 mg by mouth daily.   ondansetron 4 MG tablet Commonly known as: Zofran Take 1 tablet (4 mg total) by mouth daily as needed for nausea or vomiting.   predniSONE 20 MG tablet Commonly known as: DELTASONE Take 2  tablets (40 mg total) by mouth daily with breakfast for 5 days. Start taking on: July 22, 2020   Toujeo SoloStar 300 UNIT/ML Solostar Pen Generic drug: insulin glargine (1 Unit Dial) Inject 15 Units into the skin daily. What changed: how much to take       Time coordinating discharge: 35 minutes  The results of significant diagnostics from this hospitalization (including imaging, microbiology, ancillary and laboratory) are listed below for reference.    Procedures and Diagnostic Studies:   CT ANGIO HEAD W OR WO CONTRAST  Result Date: 07/16/2020 CLINICAL DATA:  Code stroke follow-up EXAM: CT ANGIOGRAPHY HEAD AND NECK TECHNIQUE: Multidetector CT imaging of the head and neck was performed using the standard protocol during bolus administration of intravenous contrast. Multiplanar CT image reconstructions and MIPs were obtained to evaluate the vascular anatomy. Carotid stenosis measurements (when applicable) are obtained utilizing NASCET criteria, using the distal internal carotid diameter as the denominator. CONTRAST:  52mL OMNIPAQUE IOHEXOL 350 MG/ML SOLN COMPARISON:  None. FINDINGS: CTA NECK Aortic arch: Great vessel origins are patent. Right carotid system: There is circumferential plaque about the common carotid causing 50% stenosis. Mixed plaque is present at the ICA origin with minimal stenosis. Left carotid system: Patent. Mild noncalcified plaque along the common carotid causing less than 50% stenosis. Mixed plaque at the ICA origin causing minimal stenosis. Vertebral arteries: Patent and is codominant. Eccentric noncalcified plaque along the distal left V1 segment causing 50% stenosis. Skeleton: Mild cervical spine degenerative changes. Other neck: Probable radiation changes in the neck; correlate with history. There is asymmetric enhancement at the left lateral aspect of the tongue (series 7, image 85). No enlarged lymph nodes. Prominent level 1 submental node is not pathologically  enlarged. Upper chest: Scarring at the lung apices. Review of the MIP images confirms the above findings CTA HEAD Anterior circulation: Intracranial internal carotid arteries are patent with calcified plaque causing mild stenosis. Anterior and middle cerebral arteries are patent. Posterior circulation: Intracranial vertebral arteries, basilar artery, and posterior cerebral arteries are patent. Venous sinuses: Patent as allowed by contrast bolus timing. Review of the MIP images confirms the above findings IMPRESSION: No large vessel occlusion. Circumferential plaque about the right common carotid causing 50% stenosis. No hemodynamically significant stenosis at  the ICA origins. Noncalcified plaque along the distal left V1 vertebral artery causing 50% stenosis. Asymmetric enhancement at the left lateral aspect of the tongue (series 7, image 85). Recommend direct visual inspection in this patient with history of head/neck cancer. Nonenlarged, but prominent submental lymph node. Electronically Signed   By: Macy Mis M.D.   On: 07/16/2020 16:29   DG Chest 1 View  Result Date: 07/17/2020 CLINICAL DATA:  Hypoxia, COVID-19, code stroke EXAM: CHEST  1 VIEW COMPARISON:  Radiograph 02/09/2020, CT 10/08/2015 FINDINGS: Heterogeneous areas of coarse interstitial and patchy opacity throughout both lungs. No pneumothorax. No effusion. Pulmonary vascularity is normally distributed. The aorta is calcified. The remaining cardiomediastinal contours are unremarkable. No acute osseous or soft tissue abnormality. Degenerative changes are present in the imaged spine and shoulders. Telemetry leads overlie the chest. IMPRESSION: Heterogeneous areas of coarse interstitial and patchy opacity throughout both lungs, compatible with a multifocal infection in the setting of COVID-19. Electronically Signed   By: Lovena Le M.D.   On: 07/17/2020 00:33   CT ANGIO NECK W OR WO CONTRAST  Result Date: 07/16/2020 CLINICAL DATA:  Code stroke  follow-up EXAM: CT ANGIOGRAPHY HEAD AND NECK TECHNIQUE: Multidetector CT imaging of the head and neck was performed using the standard protocol during bolus administration of intravenous contrast. Multiplanar CT image reconstructions and MIPs were obtained to evaluate the vascular anatomy. Carotid stenosis measurements (when applicable) are obtained utilizing NASCET criteria, using the distal internal carotid diameter as the denominator. CONTRAST:  40mL OMNIPAQUE IOHEXOL 350 MG/ML SOLN COMPARISON:  None. FINDINGS: CTA NECK Aortic arch: Great vessel origins are patent. Right carotid system: There is circumferential plaque about the common carotid causing 50% stenosis. Mixed plaque is present at the ICA origin with minimal stenosis. Left carotid system: Patent. Mild noncalcified plaque along the common carotid causing less than 50% stenosis. Mixed plaque at the ICA origin causing minimal stenosis. Vertebral arteries: Patent and is codominant. Eccentric noncalcified plaque along the distal left V1 segment causing 50% stenosis. Skeleton: Mild cervical spine degenerative changes. Other neck: Probable radiation changes in the neck; correlate with history. There is asymmetric enhancement at the left lateral aspect of the tongue (series 7, image 85). No enlarged lymph nodes. Prominent level 1 submental node is not pathologically enlarged. Upper chest: Scarring at the lung apices. Review of the MIP images confirms the above findings CTA HEAD Anterior circulation: Intracranial internal carotid arteries are patent with calcified plaque causing mild stenosis. Anterior and middle cerebral arteries are patent. Posterior circulation: Intracranial vertebral arteries, basilar artery, and posterior cerebral arteries are patent. Venous sinuses: Patent as allowed by contrast bolus timing. Review of the MIP images confirms the above findings IMPRESSION: No large vessel occlusion. Circumferential plaque about the right common carotid  causing 50% stenosis. No hemodynamically significant stenosis at the ICA origins. Noncalcified plaque along the distal left V1 vertebral artery causing 50% stenosis. Asymmetric enhancement at the left lateral aspect of the tongue (series 7, image 85). Recommend direct visual inspection in this patient with history of head/neck cancer. Nonenlarged, but prominent submental lymph node. Electronically Signed   By: Macy Mis M.D.   On: 07/16/2020 16:29   MR BRAIN WO CONTRAST  Result Date: 07/16/2020 CLINICAL DATA:  Transient ischemic attack.  Slurred speech. EXAM: MRI HEAD WITHOUT CONTRAST TECHNIQUE: Multiplanar, multiecho pulse sequences of the brain and surrounding structures were obtained without intravenous contrast. COMPARISON:  None. FINDINGS: Brain: No acute infarct, mass effect or extra-axial collection. Single chronic microhemorrhage  in the left parietal lobe. There is multifocal hyperintense T2-weighted signal within the white matter. Generalized volume loss without a clear lobar predilection. The midline structures are normal. Vascular: Major flow voids are preserved. Skull and upper cervical spine: Normal calvarium and skull base. Visualized upper cervical spine and soft tissues are normal. Sinuses/Orbits:Trace fluid in the right maxillary sinus. Normal orbits. IMPRESSION: 1. No acute intracranial abnormality. 2. Findings of chronic small vessel ischemia and generalized volume loss. Electronically Signed   By: Ulyses Jarred M.D.   On: 07/16/2020 23:26   CT HEAD CODE STROKE WO CONTRAST  Result Date: 07/16/2020 CLINICAL DATA:  Code stroke.  Neuro deficit, acute, stroke suspected EXAM: CT HEAD WITHOUT CONTRAST TECHNIQUE: Contiguous axial images were obtained from the base of the skull through the vertex without intravenous contrast. COMPARISON:  None. FINDINGS: Brain: No intracranial hemorrhage. Asymmetric blurring of the gray-white junctions involving the left frontotemporal region. No mass lesion.  No midline shift, ventriculomegaly or extra-axial fluid collection. Vascular: No hyperdense vessel or unexpected calcification. Bilateral skull base atherosclerotic calcifications. Skull: Negative for fracture or focal lesion. Sinuses/Orbits: Normal orbits. Right maxillary sinus secretions. No mastoid effusion. Other: None. ASPECTS [facility] Stroke Program Early CT Score) - Ganglionic level infarction (caudate, lentiform nuclei, internal capsule, insula, M1-M3 cortex): 6 - Supraganglionic infarction (M4-M6 cortex): 2 Total score (0-10 with 10 being normal): 8 IMPRESSION: 1. Asymmetric blurring of the left frontotemporal gray-white junctions may reflect artifact versus acute insult. 2. ASPECTS is 8 3. Code stroke imaging results were communicated on 07/16/2020 at 3:49 pm to provider Dr. Erlinda Hong Via secure text paging. Electronically Signed   By: Primitivo Gauze M.D.   On: 07/16/2020 16:00   ECHOCARDIOGRAM LIMITED  Result Date: 07/17/2020    ECHOCARDIOGRAM LIMITED REPORT   Patient Name:   QUARTEZ BRADEEN Date of Exam: 07/17/2020 Medical Rec #:  [id number]          Height:       67.0 in Accession #:    [id number]         Weight:       155.0 lb Date of Birth:  February 29, 1951          BSA:          1.815 m Patient Age:    69 years           BP:           155/80 mmHg Patient Gender: M                  HR:           62 bpm. Exam Location:  Inpatient Procedure: Limited Echo, Cardiac Doppler and Color Doppler Indications:    CVA  History:        Patient has no prior history of Echocardiogram examinations.                 Stroke; Risk Factors:Hypertension, Dyslipidemia, Diabetes and                 Current Smoker. COVID+.  Sonographer:    Dustin Flock Referring Phys: [id number] Vernelle Emerald  Sonographer Comments: Limited echo for COVID+ IMPRESSIONS  1. Left ventricular ejection fraction, by estimation, is 50 to 55%. The left ventricle has low normal function. There is moderate asymmetric left ventricular hypertrophy of  the basal-septal segment. Left ventricular diastolic parameters are consistent with Grade I diastolic dysfunction (impaired relaxation).  2. Right ventricular systolic function is normal. The right  ventricular size is normal.  3. The mitral valve is normal in structure. No evidence of mitral valve regurgitation.  4. The aortic valve was not well visualized. Aortic valve regurgitation is not visualized. No aortic stenosis is present.  5. The inferior vena cava is normal in size with greater than 50% respiratory variability, suggesting right atrial pressure of 3 mmHg. FINDINGS  Left Ventricle: Left ventricular ejection fraction, by estimation, is 50 to 55%. The left ventricle has low normal function. The left ventricular internal cavity size was normal in size. There is moderate asymmetric left ventricular hypertrophy of the basal-septal segment. Left ventricular diastolic parameters are consistent with Grade I diastolic dysfunction (impaired relaxation). Right Ventricle: The right ventricular size is normal. Right vetricular wall thickness was not assessed. Right ventricular systolic function is normal. Pericardium: There is no evidence of pericardial effusion. Mitral Valve: The mitral valve is normal in structure. Tricuspid Valve: The tricuspid valve is normal in structure. Tricuspid valve regurgitation is trivial. Aortic Valve: The aortic valve was not well visualized. Aortic valve regurgitation is not visualized. No aortic stenosis is present. Pulmonic Valve: The pulmonic valve was not well visualized. Pulmonic valve regurgitation is not visualized. Aorta: The aortic root is normal in size and structure. Venous: The inferior vena cava is normal in size with greater than 50% respiratory variability, suggesting right atrial pressure of 3 mmHg. IAS/Shunts: The interatrial septum was not well visualized. LEFT VENTRICLE PLAX 2D LVIDd:         3.90 cm     Diastology LVIDs:         2.90 cm     LV e' medial:    4.35 cm/s  LV PW:         1.00 cm     LV E/e' medial:  12.4 LV IVS:        1.10 cm     LV e' lateral:   4.46 cm/s LVOT diam:     2.20 cm     LV E/e' lateral: 12.1 LVOT Area:     3.80 cm  LV Volumes (MOD) LV vol d, MOD A4C: 74.9 ml LV vol s, MOD A4C: 42.0 ml LV SV MOD A4C:     74.9 ml RIGHT VENTRICLE RV Basal diam:  3.00 cm RV S prime:     12.40 cm/s LEFT ATRIUM         Index LA diam:    3.70 cm 2.04 cm/m   AORTA Ao Root diam: 3.00 cm MITRAL VALVE MV Area (PHT): 2.60 cm    SHUNTS MV Decel Time: 292 msec    Systemic Diam: 2.20 cm MV E velocity: 54.00 cm/s MV A velocity: 71.10 cm/s MV E/A ratio:  0.76 Oswaldo Milian MD Electronically signed by Oswaldo Milian MD Signature Date/Time: 07/17/2020/4:38:57 PM    Final      Labs:   Basic Metabolic Panel: Recent Labs  Lab 07/17/20 0509 07/18/20 0500 07/19/20 0317 07/20/20 0325 07/21/20 0024  NA 141 133* 131* 131* 131*  K 3.4* 3.3* 4.5 4.7 4.3  CL 104 98 96* 98 98  CO2 24 27 24 24 24   GLUCOSE 67* 123* 200* 274* 207*  BUN 25* 15 21 38* 35*  CREATININE 0.85 0.93 1.18 1.19 1.02  CALCIUM 8.6* 8.3* 8.0* 8.0* 8.2*  MG 2.3 1.9 1.8  --   --   PHOS  --  2.1* 4.3  --   --    GFR CrCl cannot be calculated (Unknown ideal weight.). Liver Function  Tests: Recent Labs  Lab 07/16/20 1538 07/18/20 0500 07/19/20 0317 07/20/20 0325 07/21/20 0024  AST 34 30 28 29  33  ALT 21 19 20 23 24   ALKPHOS 59 56 61 56 55  BILITOT 1.2 0.7 1.0 0.3 0.5  PROT 7.8 6.4* 6.8 6.1* 6.3*  ALBUMIN 2.9* 2.3* 2.3* 2.2* 2.2*   No results for input(s): LIPASE, AMYLASE in the last 168 hours. Recent Labs  Lab 07/16/20 2127  AMMONIA 25   Coagulation profile Recent Labs  Lab 07/16/20 1538  INR 1.2    CBC: Recent Labs  Lab 07/16/20 1538 07/16/20 1550 07/17/20 0038 07/18/20 0500 07/19/20 0317 07/20/20 0325 07/21/20 0024  WBC 7.4  --   --  7.6 11.9* 11.6* 10.2  NEUTROABS 6.5  --   --   --  11.1* 10.4* 8.8*  HGB 13.3   < > 13.6 13.2 13.6 12.6* 12.0*  HCT 43.5   <  > 40.0 40.2 40.6 36.9* 37.5*  MCV 84.3  --   --  80.7 79.5* 78.8* 80.5  PLT 328  --   --  313 338 325 313   < > = values in this interval not displayed.   Cardiac Enzymes: No results for input(s): CKTOTAL, CKMB, CKMBINDEX, TROPONINI in the last 168 hours. BNP: Invalid input(s): POCBNP CBG: Recent Labs  Lab 07/20/20 1643 07/20/20 2018 07/20/20 2319 07/21/20 0502 07/21/20 0840  GLUCAP 233* 243* 245* 113* 80   D-Dimer Recent Labs    07/20/20 0325 07/21/20 0024  DDIMER 2.18* 1.74*   Hgb A1c Recent Labs    07/20/20 0325  HGBA1C 6.6*   Lipid Profile No results for input(s): CHOL, HDL, LDLCALC, TRIG, CHOLHDL, LDLDIRECT in the last 72 hours. Thyroid function studies No results for input(s): TSH, T4TOTAL, T3FREE, THYROIDAB in the last 72 hours.  Invalid input(s): FREET3 Anemia work up No results for input(s): VITAMINB12, FOLATE, FERRITIN, TIBC, IRON, RETICCTPCT in the last 72 hours. Microbiology No results found for this or any previous visit (from the past 240 hour(s)).   Signed: Marlowe Aschoff Nevena Rozenberg  [facility] 07/21/2020, 10:31 AM

## 2020-07-21 NOTE — Progress Notes (Signed)
Patient discharge teaching given, including activity, diet, follow-up appoints, and medications. Patient verbalized understanding of all discharge instructions. IV access was d/c'd. Vitals are stable. Skin is intact except as charted in most recent assessments. Pt to be escorted out by RN, to be driven home by [facility] transportation.

## 2020-07-21 NOTE — TOC Transition Note (Signed)
Transition of Care [facility]) - CM/SW Discharge Note   Patient Details  Name: Louis Stone MRN: [id number] Date of Birth: 06-May-1951  [facility] of [facility]) CM/SW Contact:  Bartholomew Crews, RN Phone Number: [phone] 07/21/2020, 11:44 AM   Clinical Narrative:     Spoke with patient on hospital phone. He wants to go home but does not have a ride. Difficult to understand patient on the phone. Verbal permission received to contact his contact, Tiffany.   Spoke with Tiffany on the phone. Patient lives alone. She stated that patient was married to her mom who passed away recently 2020/07/24, and patient was admitted to hospital a few days later. Tiffany is unable to pick patient up d/t weather conditions, but his home is available if he has his key. Discussed that hospital has a transportation system for patients when needed.   Verified with patient that he has his key. Covid transport arranged with [facility]. Nursing aware of arrangements made. Transportation to call nurses station at [phone] upon arrival to [place] tower entrance.   HH PT and SW arranged with [facility] with a delayed start of care. Patient made aware of accepting agency and follow up likely the end of week.   No further TOC needs identified at this time.   Final next level of care: [place] Barriers to Discharge: No Barriers Identified   Patient Goals and CMS Choice Patient states their goals for this hospitalization and ongoing recovery are:: return to his home CMS Medicare.gov Compare Post Acute Care list provided to:: Patient Choice offered to / list presented to : Patient  Discharge Placement                       Discharge Plan and Services In-house Referral: Clinical Social Work Discharge Planning Services: CM Consult Post Acute Care Choice: Home Health          DME Arranged: N/A DME Agency: NA       HH Arranged: PT,Social Work [place]: [place] Date [facility]  Agency Contacted: 07/21/20 Time [place]: 1143 Representative spoke with at [place]: [place] ([place]) Interventions     Readmission Risk Interventions No flowsheet data found.

## 2020-07-21 NOTE — Progress Notes (Addendum)
Spoke with Tiffany, step daughter. She is unable to transport patient home due to the road being obstructed in [place] and around her home. RN informed MD about need for PTAR or other transportation for home.  RN educated patient on benefits of short term rehab to see if it may be a good option. Patient declined and requests to go home.

## 2020-07-21 NOTE — Plan of Care (Signed)
  Problem: Education: Goal: Knowledge of risk factors and measures for prevention of condition will improve Outcome: Progressing   Problem: Coping: Goal: Psychosocial and spiritual needs will be supported Outcome: Progressing   Problem: Education: Goal: Knowledge of disease or condition will improve Outcome: Progressing   Problem: Ischemic Stroke/TIA Tissue Perfusion: Goal: Complications of ischemic stroke/TIA will be minimized Outcome: Progressing   Problem: Nutrition: Goal: Adequate nutrition will be maintained Outcome: Progressing

## 2020-10-15 ENCOUNTER — Encounter: Payer: Self-pay | Admitting: Physician Assistant

## 2020-10-16 ENCOUNTER — Encounter: Payer: Self-pay | Admitting: Gastroenterology

## 2020-10-22 ENCOUNTER — Encounter: Payer: Self-pay | Admitting: Physician Assistant

## 2020-10-24 ENCOUNTER — Inpatient Hospital Stay: Payer: Medicare Other | Attending: Oncology | Admitting: Oncology

## 2020-10-24 ENCOUNTER — Inpatient Hospital Stay: Payer: Medicare Other

## 2020-10-24 ENCOUNTER — Encounter: Payer: Self-pay | Admitting: Oncology

## 2020-10-24 VITALS — BP 89/61 | HR 75 | Temp 97.8°F | Resp 20 | Wt 147.8 lb

## 2020-10-24 DIAGNOSIS — R109 Unspecified abdominal pain: Secondary | ICD-10-CM | POA: Diagnosis not present

## 2020-10-24 DIAGNOSIS — Z79899 Other long term (current) drug therapy: Secondary | ICD-10-CM

## 2020-10-24 DIAGNOSIS — Z923 Personal history of irradiation: Secondary | ICD-10-CM | POA: Insufficient documentation

## 2020-10-24 DIAGNOSIS — I959 Hypotension, unspecified: Secondary | ICD-10-CM | POA: Diagnosis not present

## 2020-10-24 DIAGNOSIS — Z9221 Personal history of antineoplastic chemotherapy: Secondary | ICD-10-CM | POA: Diagnosis not present

## 2020-10-24 DIAGNOSIS — Z794 Long term (current) use of insulin: Secondary | ICD-10-CM | POA: Insufficient documentation

## 2020-10-24 DIAGNOSIS — Z87891 Personal history of nicotine dependence: Secondary | ICD-10-CM

## 2020-10-24 DIAGNOSIS — Z8249 Family history of ischemic heart disease and other diseases of the circulatory system: Secondary | ICD-10-CM

## 2020-10-24 DIAGNOSIS — C109 Malignant neoplasm of oropharynx, unspecified: Secondary | ICD-10-CM | POA: Diagnosis not present

## 2020-10-24 DIAGNOSIS — E119 Type 2 diabetes mellitus without complications: Secondary | ICD-10-CM | POA: Diagnosis not present

## 2020-10-24 DIAGNOSIS — R059 Cough, unspecified: Secondary | ICD-10-CM

## 2020-10-24 NOTE — Progress Notes (Signed)
Hematology/Oncology Consult note [facility] [phone] [phone]   Patient Care Team: Alene Mires Elyse Jarvis, MD as PCP - General (Family Medicine)  REFERRING PROVIDER: Felicity Coyer, PA  CHIEF COMPLAINTS/REASON FOR VISIT:  Evaluation of history of oropharyngeal cancer  HISTORY OF PRESENTING ILLNESS:   Louis Stone is a  70 y.o.  male with PMH listed below was seen in consultation at the request of  Felicity Coyer, [place]  for evaluation of history of oropharyngeal cancer.  Patient reports a history of oropharyngeal cancer in 2009.Marland Kitchen  Her treatment records were not available in current EMR or Care Everywhere.  Patient was seen by ENT in 2017.  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 [cisplatin 100mg /m2 x2- RT 7000cGy- [facility] Dr.Rosenman; Dr. Couch]; PET 2009- NED.   Patient was recently seen by primary care provider and reports history of stomach pain and when he cough he noticed some blood in his spit.  Reports that he has not been actively follow-up with any ENT oncologist so patient was referred to oncology to establish care. 10/15/2015, patient was seen by ENT Dr. Pryor Ochoa. Patient had no evidence of recurrence on examination at that time.  He was recommended to follow-up with ENT annually for examination and a chest x-ray and TSH annually.  He did not follow-up.  I reviewed his blood work that was recently done at primary care provider's office  10/15/2020, PSA 1, WBC 4.2, platelet 255,000, hemoglobin 11.1, hematocrit 34.3, MCV 84, creatinine 0.93, potassium 2.9, calcium 8.5, protein 6.4, albumin 3.1, bilirubin 1.3, alkaline phosphatase 103, AST 12 ALT 7,  Patient denies any new complaints. 60-pack-year smoking history, quitting in 2008. Patient was previously referred to lung cancer screening program and had a CT scan of the chest on 10/08/2015- lung Rads-1 He has not had any  CT of lung since then   Review of Systems  Constitutional: Negative for appetite change, chills, fatigue, fever and unexpected weight change.  HENT:   Negative for hearing loss and voice change.   Eyes: Negative for eye problems and icterus.  Respiratory: Negative for chest tightness, cough and shortness of breath.   Cardiovascular: Negative for chest pain and leg swelling.  Gastrointestinal: Negative for abdominal distention and abdominal pain.  Endocrine: Negative for hot flashes.  Genitourinary: Negative for difficulty urinating, dysuria and frequency.   Musculoskeletal: Negative for arthralgias.  Skin: Negative for itching and rash.  Neurological: Negative for light-headedness and numbness.  Hematological: Negative for adenopathy. Does not bruise/bleed easily.  Psychiatric/Behavioral: Negative for confusion.    MEDICAL HISTORY:  Past Medical History:  Diagnosis Date  . Arthritis    per pt. I do not have md notes that indicate dx.  . Diabetes mellitus without complication ([place])   . Oropharynx cancer ([place]) 2009   squamous cell cancer  Stage T3, N2c, M0  . Personal history of tobacco use, presenting hazards to health 10/08/2015    SURGICAL HISTORY: Past Surgical History:  Procedure Laterality Date  . Andrew   pt states ruptured disc and had surgery to repair it    SOCIAL HISTORY: Social History   Socioeconomic History  . Marital status: Single    Spouse name: Not on file  . Number of children: Not on file  . Years of education: Not on file  . Highest education level: Not on file  Occupational History  . Not on file  Tobacco Use  . Smoking status:  Former Smoker    Packs/day: 1.50    Years: 40.00    Pack years: 60.00    Quit date: 07/05/2006    Years since quitting: 14.3  . Smokeless tobacco: Never Used  Substance and Sexual Activity  . Alcohol use: No    Comment: quit in 1991  . Drug use: No  . Sexual activity: Not on file  Other Topics  Concern  . Not on file  Social History Narrative  . Not on file   Social Determinants of Health   Financial Resource Strain: Not on file  Food Insecurity: Not on file  Transportation Needs: Not on file  Physical Activity: Not on file  Stress: Not on file  Social Connections: Not on file  Intimate Partner Violence: Not on file    FAMILY HISTORY: Family History  Problem Relation Age of Onset  . CVA Father   . Prostate cancer Neg Hx        not sure what family member    ALLERGIES:  has No Known Allergies.  MEDICATIONS:  Current Outpatient Medications  Medication Sig Dispense Refill  . ACCU-CHEK AVIVA PLUS test strip     . acetaminophen (TYLENOL) 325 MG tablet Take 650 mg by mouth every 6 (six) hours as needed for headache or mild pain.    Marland Kitchen albuterol (PROVENTIL HFA;VENTOLIN HFA) 108 (90 Base) MCG/ACT inhaler Inhale 2 puffs into the lungs every 6 (six) hours as needed for wheezing.    Marland Kitchen atorvastatin (LIPITOR) 20 MG tablet Take 20 mg by mouth daily.    . benzonatate (TESSALON) 200 MG capsule Take 200 mg by mouth 3 (three) times daily as needed for cough.    . famotidine (PEPCID) 40 MG tablet Take 40 mg by mouth daily.    Marland Kitchen lisinopril (ZESTRIL) 5 MG tablet Take 5 mg by mouth daily.    Marland Kitchen omeprazole (PRILOSEC) 40 MG capsule Take 40 mg by mouth daily.    . ondansetron (ZOFRAN) 4 MG tablet Take 1 tablet (4 mg total) by mouth daily as needed for nausea or vomiting. 20 tablet 0  . Potassium Chloride ER 20 MEQ TBCR Take 1 tablet by mouth 2 (two) times daily.    Nelva Nay SOLOSTAR 300 UNIT/ML Solostar Pen Inject 15 Units into the skin daily.    Marland Kitchen guaiFENesin-dextromethorphan (ROBITUSSIN DM) 100-10 MG/5ML syrup Take 10 mLs by mouth every 4 (four) hours as needed for cough. (Patient not taking: Reported on 10/24/2020) 118 mL 0   No current facility-administered medications for this visit.     PHYSICAL EXAMINATION: ECOG PERFORMANCE STATUS: 1 - Symptomatic but completely  ambulatory Vitals:   10/24/20 1042  BP: (!) 89/61  Pulse: 75  Resp: 20  Temp: 97.8 F (36.6 C)  SpO2: 98%   Filed Weights   10/24/20 1042  Weight: 147 lb 12.8 oz (67 kg)    Physical Exam Constitutional:      General: He is not in acute distress. HENT:     Head: Normocephalic and atraumatic.     Mouth/Throat:     Comments: Reduced mouth opening Eyes:     General: No scleral icterus. Cardiovascular:     Rate and Rhythm: Normal rate and regular rhythm.     Heart sounds: Normal heart sounds.  Pulmonary:     Effort: Pulmonary effort is normal. No respiratory distress.     Breath sounds: No wheezing.  Abdominal:     General: Bowel sounds are normal. There is no distension.  Palpations: Abdomen is soft.  Musculoskeletal:        General: No deformity. Normal range of motion.     Cervical back: Normal range of motion and neck supple.  Skin:    General: Skin is warm and dry.     Findings: No erythema or rash.  Neurological:     Mental Status: He is alert and oriented to person, place, and time. Mental status is at baseline.     Cranial Nerves: No cranial nerve deficit.     Coordination: Coordination normal.  Psychiatric:        Mood and Affect: Mood normal.     LABORATORY DATA:  I have reviewed the data as listed Lab Results  Component Value Date   WBC 10.2 07/21/2020   HGB 12.0 (L) 07/21/2020   HCT 37.5 (L) 07/21/2020   MCV 80.5 07/21/2020   PLT 313 07/21/2020   Recent Labs    02/08/20 1304 07/16/20 1538 07/19/20 0317 07/20/20 0325 07/21/20 0024  NA 135   < > 131* 131* 131*  K 4.1   < > 4.5 4.7 4.3  CL 103   < > 96* 98 98  CO2 25   < > 24 24 24   GLUCOSE 254*   < > 200* 274* 207*  BUN 18   < > 21 38* 35*  CREATININE 1.01   < > 1.18 1.19 1.02  CALCIUM 8.4*   < > 8.0* 8.0* 8.2*  GFRNONAA >60   < > >60 >60 >60  GFRAA >60  --   --   --   --   PROT 7.0   < > 6.8 6.1* 6.3*  ALBUMIN 3.4*   < > 2.3* 2.2* 2.2*  AST 28   < > 28 29 33  ALT 14   < > 20 23 24    ALKPHOS 74   < > 61 56 55  BILITOT 1.0   < > 1.0 0.3 0.5   < > = values in this interval not displayed.   Iron/TIBC/Ferritin/ %Sat No results found for: IRON, TIBC, FERRITIN, IRONPCTSAT    RADIOGRAPHIC STUDIES: I have personally reviewed the radiological images as listed and agreed with the findings in the report. No results found.    ASSESSMENT & PLAN:  1. Oropharyngeal cancer ([place])   2. Former smoker   3. Hypotension, unspecified hypotension type    #Remote history of stage IV oropharyngeal cancer status post chemoradiation in 2009. Clinically no evidence of recurrence. Patient has not followed up with ENT.  I will refer him to reestablish care with Dr. Pryor Ochoa for evaluation. Recent CBC CMP done at primary care provider's office, results were reviewed.   07/16/2020, TSH 2.6.  Recommend CBC CMP TSH annually.  #History of smoking, quitting 2008.-I will refer patient back to lung cancer screening program. #Hypotension, recommend patient to monitor blood pressure at home prior to take blood pressure medication.  Follow-up with primary care provider.  Encourage oral hydration. Recommend patient to follow-up in 12 months.  Orders Placed This Encounter  Procedures  . Ambulatory referral to ENT    Referral Priority:   Routine    Referral Type:   Consultation    Referral Reason:   Specialty Services Required    Referred to Provider:   Carloyn Manner, MD    Requested Specialty:   Otolaryngology    Number of Visits Requested:   1    All questions were answered. The patient knows to call the clinic  with any problems questions or concerns.  cc Felicity Coyer, PA   Thank you for this kind referral and the opportunity to participate in the care of this patient. A copy of today's note is routed to referring provider    Earlie Server, MD, PhD Hematology Oncology [facility] Pager- [phone] 10/24/2020

## 2020-10-28 ENCOUNTER — Telehealth: Payer: Self-pay

## 2020-10-28 NOTE — Telephone Encounter (Signed)
Please schedule as requested.

## 2020-10-28 NOTE — Telephone Encounter (Signed)
let him know that I reviewed his records. recommend him to see ENT for examination. follow up with me 1 year, lab cbc cmp TSH.

## 2020-10-28 NOTE — Telephone Encounter (Signed)
10/28/2020 F/u appts made for 1 year, spoke w/ pt and informed him-he confirmed appts. Will mail AVS  SRW

## 2021-01-13 ENCOUNTER — Ambulatory Visit: Payer: Medicare Other | Attending: Family Medicine | Admitting: Speech Pathology

## 2021-01-13 ENCOUNTER — Other Ambulatory Visit: Payer: Self-pay

## 2021-01-13 DIAGNOSIS — R252 Cramp and spasm: Secondary | ICD-10-CM | POA: Diagnosis present

## 2021-01-13 DIAGNOSIS — R471 Dysarthria and anarthria: Secondary | ICD-10-CM

## 2021-01-13 DIAGNOSIS — Z85818 Personal history of malignant neoplasm of other sites of lip, oral cavity, and pharynx: Secondary | ICD-10-CM | POA: Diagnosis present

## 2021-01-14 ENCOUNTER — Encounter: Payer: Self-pay | Admitting: Speech Pathology

## 2021-01-14 ENCOUNTER — Telehealth: Payer: Self-pay | Admitting: Speech Pathology

## 2021-01-14 NOTE — Therapy (Signed)
[place] MAIN [facility] SERVICES [address] [place], [place], [address] Phone: [phone]   Fax:  [phone]  Patient Details  Name: Corrado Hymon MRN: [id number] Date of Birth: [birth year] Referring Provider:  Carloyn Manner  Encounter Date: 01/14/2021  Pt requested deferring skilled ST intervention until his house could be treated for bedbugs. Once treatment has been completed, this Probation officer will contact referring MD for another order.  Aaric Dolph B. Rutherford Nail M.S., CCC-SLP, [place] Pathologist Rehabilitation Services Office [phone]  [name] 01/14/2021, 8:06 PM  [place] MAIN [facility] SERVICES [address] [place], [place], [address] Phone: [phone]   Fax:  [phone]

## 2021-01-14 NOTE — Therapy (Signed)
[place] [facility] SERVICES [address] [place], [place], [address] Phone: [phone]   Fax:  [phone]  Speech Language Pathology Evaluation  Patient Details  Name: Louis Stone MRN: [id number] Date of Birth: 1951/02/23 Referring Provider (SLP): Louis Stone   Encounter Date: 01/13/2021   End of Session - 01/14/21 1926     Visit Number 1    Number of Visits 17    Date for SLP Re-Evaluation 03/11/21    Authorization Type UHC Medicare    Authorization Time Period 01/13/2021 thru 03/11/2021    Authorization - Visit Number 1    Progress Note Due on Visit 10    SLP Start Time 1000    SLP Stop Time  1100    SLP Time Calculation (min) 60 min    Activity Tolerance Patient tolerated treatment well;Other (comment)   evaluation ended early as pt had insects on his body and appeared uncomfortablly scratching            Past Medical History:  Diagnosis Date   Arthritis    per pt. I do not have md notes that indicate dx.   Diabetes mellitus without complication ([place])    Oropharynx cancer ([place]) 2009   squamous cell cancer  Stage T3, N2c, M0   Personal history of tobacco use, presenting hazards to health 10/08/2015    Past Surgical History:  Procedure Laterality Date   [place]   pt states ruptured disc and had surgery to repair it    There were no vitals filed for this visit.   Subjective Assessment - 01/14/21 1917     Subjective "Dr Pryor Ochoa isn't sure this (therapy) will help"    Currently in Pain? 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 Pt with history significant for T3N2c SCCA of base  of tongue [facility] 2009). Pt currently diagnosed with "significant trismus with limited mouth opening to 3 cm." This appears to be chronic d/t XRT changes throughout neck and oral cavity. Laryngoscopy also revealed cobblestoning and erythema as well as interarytenoid pachydermia.    Behavioral/Cognition pleasant, good historian    Mobility Status ambulatory      Balance Screen   Has the patient fallen in the past 6 months No    Has the patient had a decrease in activity level because of a fear of falling?  No    Is the patient reluctant to leave their home because of a fear of falling?  No      Prior Functional Status   Cognitive/Linguistic Baseline Information not available    Type of Home House     Lives With Alone      Cognition   Overall Cognitive Status Within Functional Limits for tasks assessed      Expression   Primary Mode of Expression Verbal      Written Expression   Dominant Hand Right      Oral Motor/Sensory Function   Overall Oral Motor/Sensory Function Impaired    Labial  ROM Within Functional Limits    Labial Symmetry Within Functional Limits    Labial Strength Within Functional Limits    Labial Sensation Within Functional Limits    Labial Coordination WFL    Lingual ROM Reduced right;Reduced left    Lingual Strength Reduced    Lingual Sensation Reduced    Lingual Coordination Reduced    Facial ROM Within Functional Limits    Facial Symmetry Within Functional Limits    Facial Strength Within Functional Limits    Facial Sensation Within Functional Limits    Facial Coordination WFL    Mandible Impaired    Overall Oral Motor/Sensory Function severe Trismus, moderate lingual impairments      Motor Speech   Overall Motor Speech Impaired    Respiration Within functional limits    Phonation Low vocal intensity    Resonance Hypernasality    Articulation Impaired    Level of Impairment Word    Intelligibility Intelligibility reduced    Word 50-74% accurate     Phrase 50-74% accurate    Sentence 25-49% accurate    Conversation 25-49% accurate    Motor Planning Witnin functional limits    Motor Speech Errors Not applicable    Interfering Components Anatomical limitations    Phonation Impaired    Vocal Abuses Habitual Hyperphonia    Tension Present Jaw;Neck;Shoulder    Volume Appropriate                             SLP Education - 01/14/21 1925     Education Details Trismus, POC    Person(s) Educated Patient    Methods Explanation    Comprehension Verbalized understanding              SLP Short Term Goals - 01/14/21 1936       SLP SHORT TERM GOAL #1   Title Pt will complete Trismus HEP with 80% accuracy.    Baseline new goal    Time 10    Period --   sessions   Status New              SLP Long Term Goals - 01/14/21 1943       SLP LONG TERM GOAL #1   Title Pt will increase speech intelligibility to achieve ~ 75% intelligibility at the sentence level.    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 Pt reports trismus since XRT in 2009, he is aware that prognosis is guarded for improvement but wishes to attempt given that his speech intelligibility and food consumption is severely impacted by Trismus. Pt stated that when attempting to open his mouth he has subtle pain located around his right TMJ. This was not worsened with therapeutic activity. Pt's has immense difficulty protuding and lateralizing his tongue which reduced his overall lingual ROM. These deficits result in decreased speech intelligibility of ~ 50% intelligibility at the sentence level. Pt's speech is c/b reduced imprecise articulatory movements, strained vocal  quality and hypernasality. At this time, recommend trial period of skilled ST to increase pt's speech intelligibility by targeting pt's Trismus  in an effort to help to better communicate his wants and needs.    Speech Therapy Frequency 2x / week    Duration 12 weeks    Treatment/Interventions SLP instruction and feedback;Patient/family education;Oral motor exercises    Potential to Achieve Goals Fair    Potential Considerations Severity of impairments;Financial resources;Family/community support;Other (comment)   13 years post onset   Consulted and Agree with Plan of Care Patient             Patient will benefit from skilled therapeutic intervention in order to improve the following deficits and impairments:   Dysarthria and anarthria  Trismus  Personal history of malignant neoplasm of other sites of lip, oral cavity, and pharynx    Problem List Patient Active Problem List   Diagnosis Date Noted   Acute left-sided weakness 16/04/9603   Acute metabolic encephalopathy 54/03/8118   Mixed diabetic hyperlipidemia associated with type 2 diabetes mellitus ([place]) 07/16/2020   Uncontrolled type 2 diabetes mellitus with hypoglycemia without coma, with long-term current use of insulin ([place]) 07/16/2020   COVID-19 virus infection 07/16/2020   GERD without esophagitis 07/16/2020   Bradycardia 07/16/2020   Sepsis ([place]) 09/16/2016   Personal history of tobacco use, presenting hazards to health 10/08/2015   Steele Stracener B. Rutherford Nail M.S., CCC-SLP, [place] Pathologist [facility] Services Office [phone]  [name] 01/14/2021, 7:47 PM  [place] [facility] SERVICES [address] [place], [place], [address] Phone: [phone]   Fax:  [phone]  Name: [place] Frieden MRN: [id number] Date of Birth: 1951-06-27

## 2021-01-14 NOTE — Telephone Encounter (Signed)
In following [facility] policy, I attempted to contact pt with information regarding identification of insects found crawling on him.   At 1145, I used my Ascom and called [phone] for 3 attempts with phone answered but immediately hung up. I also called pt's other contact number [phone] and received message "We're sorry, your call cannot be completed as dialed, please hang up and try again."    At 1253 I use my Ascom to call both numbers again with the same results as described above.   Knowing that the receptionist had been able to reach pt when scheduling his evaluation, I then used her phone at 1255 to call pt at [phone]. Pt answered. I provided information that the insects on pt were identified as bedbugs and additional information shared with pt on ways to continue offering skilled therapy sessions but requesting pt not use valet services as well as change in therapy room to rehab conference room. Pt stated, "I have been meaning to get some spray for my house, but have been able to yet." In response to this comment, I asked pt if he would be interested in me pursuing some possible resources to help pt with treating his house. He stated, "I reckon that would be ok, I reckon that would be a big help." I committed to pt to investigate resources and to calling him back with possible information.   At this time, pt expressed desire to pursue ST services once his house has been treated. He voiced appreciation of this writer's call and effort to help pt.   Shavona Gunderman B. Rutherford Nail M.S., [place], [place] Office [phone]

## 2021-01-15 ENCOUNTER — Ambulatory Visit: Payer: Medicare Other | Admitting: Speech Pathology

## 2021-01-20 ENCOUNTER — Ambulatory Visit: Payer: Medicare Other | Admitting: Speech Pathology

## 2021-01-22 ENCOUNTER — Encounter: Payer: Medicare Other | Admitting: Speech Pathology

## 2021-01-27 ENCOUNTER — Encounter: Payer: Medicare Other | Admitting: Speech Pathology

## 2021-01-29 ENCOUNTER — Encounter: Payer: Medicare Other | Admitting: Speech Pathology

## 2021-02-01 ENCOUNTER — Emergency Department: Payer: Medicare Other

## 2021-02-01 ENCOUNTER — Other Ambulatory Visit: Payer: Self-pay

## 2021-02-01 ENCOUNTER — Encounter: Payer: Self-pay | Admitting: Internal Medicine

## 2021-02-01 ENCOUNTER — Inpatient Hospital Stay
Admission: EM | Admit: 2021-02-01 | Discharge: 2021-02-06 | DRG: 871 | Disposition: A | Discharge status: Home Health | Payer: Medicare Other | Attending: Hospitalist | Admitting: Hospitalist

## 2021-02-01 DIAGNOSIS — Z6821 Body mass index (BMI) 21.0-21.9, adult: Secondary | ICD-10-CM

## 2021-02-01 DIAGNOSIS — E782 Mixed hyperlipidemia: Secondary | ICD-10-CM | POA: Diagnosis present

## 2021-02-01 DIAGNOSIS — K219 Gastro-esophageal reflux disease without esophagitis: Secondary | ICD-10-CM | POA: Diagnosis present

## 2021-02-01 DIAGNOSIS — A419 Sepsis, unspecified organism: Secondary | ICD-10-CM | POA: Diagnosis present

## 2021-02-01 DIAGNOSIS — Z9221 Personal history of antineoplastic chemotherapy: Secondary | ICD-10-CM

## 2021-02-01 DIAGNOSIS — J181 Lobar pneumonia, unspecified organism: Secondary | ICD-10-CM | POA: Diagnosis not present

## 2021-02-01 DIAGNOSIS — Z8616 Personal history of COVID-19: Secondary | ICD-10-CM | POA: Diagnosis not present

## 2021-02-01 DIAGNOSIS — E1169 Type 2 diabetes mellitus with other specified complication: Secondary | ICD-10-CM | POA: Diagnosis present

## 2021-02-01 DIAGNOSIS — J189 Pneumonia, unspecified organism: Secondary | ICD-10-CM | POA: Diagnosis present

## 2021-02-01 DIAGNOSIS — Z79899 Other long term (current) drug therapy: Secondary | ICD-10-CM | POA: Diagnosis not present

## 2021-02-01 DIAGNOSIS — R6520 Severe sepsis without septic shock: Secondary | ICD-10-CM

## 2021-02-01 DIAGNOSIS — E1165 Type 2 diabetes mellitus with hyperglycemia: Secondary | ICD-10-CM | POA: Diagnosis present

## 2021-02-01 DIAGNOSIS — Z20822 Contact with and (suspected) exposure to covid-19: Secondary | ICD-10-CM | POA: Diagnosis present

## 2021-02-01 DIAGNOSIS — E785 Hyperlipidemia, unspecified: Secondary | ICD-10-CM | POA: Diagnosis present

## 2021-02-01 DIAGNOSIS — I10 Essential (primary) hypertension: Secondary | ICD-10-CM | POA: Diagnosis present

## 2021-02-01 DIAGNOSIS — E11649 Type 2 diabetes mellitus with hypoglycemia without coma: Secondary | ICD-10-CM | POA: Diagnosis present

## 2021-02-01 DIAGNOSIS — E43 Unspecified severe protein-calorie malnutrition: Secondary | ICD-10-CM | POA: Diagnosis present

## 2021-02-01 DIAGNOSIS — E860 Dehydration: Secondary | ICD-10-CM | POA: Diagnosis present

## 2021-02-01 DIAGNOSIS — B888 Other specified infestations: Secondary | ICD-10-CM | POA: Diagnosis not present

## 2021-02-01 DIAGNOSIS — J9601 Acute respiratory failure with hypoxia: Secondary | ICD-10-CM | POA: Diagnosis present

## 2021-02-01 DIAGNOSIS — Z87891 Personal history of nicotine dependence: Secondary | ICD-10-CM

## 2021-02-01 DIAGNOSIS — B854 Mixed pediculosis and phthiriasis: Secondary | ICD-10-CM | POA: Diagnosis not present

## 2021-02-01 DIAGNOSIS — C109 Malignant neoplasm of oropharynx, unspecified: Secondary | ICD-10-CM | POA: Diagnosis present

## 2021-02-01 DIAGNOSIS — Z923 Personal history of irradiation: Secondary | ICD-10-CM | POA: Diagnosis not present

## 2021-02-01 DIAGNOSIS — Z823 Family history of stroke: Secondary | ICD-10-CM

## 2021-02-01 DIAGNOSIS — N179 Acute kidney failure, unspecified: Secondary | ICD-10-CM

## 2021-02-01 DIAGNOSIS — Z85818 Personal history of malignant neoplasm of other sites of lip, oral cavity, and pharynx: Secondary | ICD-10-CM | POA: Diagnosis not present

## 2021-02-01 DIAGNOSIS — I248 Other forms of acute ischemic heart disease: Secondary | ICD-10-CM | POA: Diagnosis present

## 2021-02-01 DIAGNOSIS — Z794 Long term (current) use of insulin: Secondary | ICD-10-CM

## 2021-02-01 LAB — TROPONIN I (HIGH SENSITIVITY)
Troponin I (High Sensitivity): 34 ng/L — ABNORMAL HIGH (ref <18)
Troponin I (High Sensitivity): 22 ng/L — ABNORMAL HIGH (ref <18)

## 2021-02-01 LAB — COMPREHENSIVE METABOLIC PANEL
ALT: 14 U/L (ref 0–44)
AST: 30 U/L (ref 15–41)
Albumin: 3.1 g/dL — ABNORMAL LOW (ref 3.5–5.0)
Alkaline Phosphatase: 63 U/L (ref 38–126)
Anion gap: 12 (ref 5–15)
BUN: 49 mg/dL — ABNORMAL HIGH (ref 8–23)
CO2: 24 mmol/L (ref 22–32)
Calcium: 8.7 mg/dL — ABNORMAL LOW (ref 8.9–10.3)
Chloride: 104 mmol/L (ref 98–111)
Creatinine, Ser: 3.07 mg/dL — ABNORMAL HIGH (ref 0.61–1.24)
GFR, Estimated: 21 mL/min — ABNORMAL LOW (ref >60)
Glucose, Bld: 183 mg/dL — ABNORMAL HIGH (ref 70–99)
Potassium: 4.1 mmol/L (ref 3.5–5.1)
Sodium: 140 mmol/L (ref 135–145)
Total Bilirubin: 1.5 mg/dL — ABNORMAL HIGH (ref 0.3–1.2)
Total Protein: 7.4 g/dL (ref 6.5–8.1)

## 2021-02-01 LAB — GLUCOSE, CAPILLARY
Glucose-Capillary: 211 mg/dL — ABNORMAL HIGH (ref 70–99)
Glucose-Capillary: 133 mg/dL — ABNORMAL HIGH (ref 70–99)

## 2021-02-01 LAB — MRSA NEXT GEN BY PCR, NASAL: MRSA by PCR Next Gen: NOT DETECTED

## 2021-02-01 LAB — CBC WITH DIFFERENTIAL/PLATELET
Abs Immature Granulocytes: 0.09 10*3/uL — ABNORMAL HIGH (ref 0.00–0.07)
Basophils Absolute: 0 10*3/uL (ref 0.0–0.1)
Basophils Relative: 0 %
Eosinophils Absolute: 0 10*3/uL (ref 0.0–0.5)
Eosinophils Relative: 0 %
HCT: 39.1 % (ref 39.0–52.0)
Hemoglobin: 12.8 g/dL — ABNORMAL LOW (ref 13.0–17.0)
Immature Granulocytes: 1 %
Lymphocytes Relative: 4 %
Lymphs Abs: 0.5 10*3/uL — ABNORMAL LOW (ref 0.7–4.0)
MCH: 27.5 pg (ref 26.0–34.0)
MCHC: 32.7 g/dL (ref 30.0–36.0)
MCV: 84.1 fL (ref 80.0–100.0)
Monocytes Absolute: 0.5 10*3/uL (ref 0.1–1.0)
Monocytes Relative: 4 %
Neutro Abs: 10.6 10*3/uL — ABNORMAL HIGH (ref 1.7–7.7)
Neutrophils Relative %: 91 %
Platelets: 220 10*3/uL (ref 150–400)
RBC: 4.65 MIL/uL (ref 4.22–5.81)
RDW: 14.6 % (ref 11.5–15.5)
WBC: 11.9 10*3/uL — ABNORMAL HIGH (ref 4.0–10.5)
nRBC: 0 % (ref 0.0–0.2)

## 2021-02-01 LAB — BLOOD GAS, ARTERIAL
Acid-base deficit: 1.3 mmol/L (ref 0.0–2.0)
Bicarbonate: 23.7 mmol/L (ref 20.0–28.0)
FIO2: 1
O2 Saturation: 89.6 %
Patient temperature: 37
pCO2 arterial: 40 mmHg (ref 32.0–48.0)
pH, Arterial: 7.38 (ref 7.350–7.450)
pO2, Arterial: 59 mmHg — ABNORMAL LOW (ref 83.0–108.0)

## 2021-02-01 LAB — LACTIC ACID, PLASMA
Lactic Acid, Venous: 3.4 mmol/L (ref 0.5–1.9)
Lactic Acid, Venous: 2.9 mmol/L (ref 0.5–1.9)

## 2021-02-01 LAB — COOXEMETRY PANEL
Carboxyhemoglobin: 1.5 % (ref 0.5–1.5)
Methemoglobin: 0.8 % (ref 0.0–1.5)
O2 Saturation: 88.6 %
Total hemoglobin: 15.6 g/dL (ref 12.0–16.0)
Total oxygen content: 85.1 mL/dL

## 2021-02-01 LAB — RESP PANEL BY RT-PCR (FLU A&B, COVID) ARPGX2
Influenza A by PCR: NEGATIVE
Influenza B by PCR: NEGATIVE
SARS Coronavirus 2 by RT PCR: NEGATIVE

## 2021-02-01 LAB — BRAIN NATRIURETIC PEPTIDE: B Natriuretic Peptide: 86.8 pg/mL (ref 0.0–100.0)

## 2021-02-01 MED ORDER — LACTATED RINGERS IV BOLUS
1000.0000 mL | Freq: Once | INTRAVENOUS | Status: AC
  Administered 2021-02-01: 1000 mL via INTRAVENOUS

## 2021-02-01 MED ORDER — HEPARIN SODIUM (PORCINE) 5000 UNIT/ML IJ SOLN
5000.0000 [IU] | Freq: Three times a day (TID) | INTRAMUSCULAR | Status: DC
  Administered 2021-02-01 – 2021-02-04 (×8): 5000 [IU] via SUBCUTANEOUS
  Filled 2021-02-01 (×8): qty 1

## 2021-02-01 MED ORDER — OXYCODONE HCL 5 MG PO TABS
5.0000 mg | ORAL_TABLET | ORAL | Status: DC | PRN
  Administered 2021-02-01 – 2021-02-04 (×2): 5 mg via ORAL
  Filled 2021-02-01 (×2): qty 1

## 2021-02-01 MED ORDER — POLYETHYLENE GLYCOL 3350 17 G PO PACK
17.0000 g | PACK | Freq: Every day | ORAL | Status: DC | PRN
Start: 2021-02-01 — End: 2021-02-06
  Administered 2021-02-04: 19:00:00 17 g via ORAL
  Filled 2021-02-01: qty 1

## 2021-02-01 MED ORDER — ONDANSETRON HCL 4 MG PO TABS: 4.0000 mg | ORAL_TABLET | Freq: Four times a day (QID) | ORAL | Status: DC | PRN

## 2021-02-01 MED ORDER — SODIUM CHLORIDE 0.9 % IV SOLN
500.0000 mg | Freq: Once | INTRAVENOUS | Status: AC
  Administered 2021-02-01: 500 mg via INTRAVENOUS
  Filled 2021-02-01: qty 500

## 2021-02-01 MED ORDER — ACETAMINOPHEN 650 MG RE SUPP: 650.0000 mg | Freq: Four times a day (QID) | RECTAL | Status: DC | PRN

## 2021-02-01 MED ORDER — SODIUM CHLORIDE 0.9 % IV SOLN
2.0000 g | INTRAVENOUS | Status: DC
  Administered 2021-02-01 – 2021-02-04 (×4): 2 g via INTRAVENOUS
  Filled 2021-02-01 (×4): qty 2
  Filled 2021-02-01 (×2): qty 20

## 2021-02-01 MED ORDER — SODIUM CHLORIDE 0.9 % IV SOLN
2.0000 g | Freq: Once | INTRAVENOUS | Status: AC
  Administered 2021-02-01: 2 g via INTRAVENOUS
  Filled 2021-02-01: qty 2

## 2021-02-01 MED ORDER — INSULIN ASPART 100 UNIT/ML IJ SOLN
0.0000 [IU] | Freq: Three times a day (TID) | INTRAMUSCULAR | Status: DC
  Administered 2021-02-01: 3 [IU] via SUBCUTANEOUS
  Administered 2021-02-02: 1 [IU] via SUBCUTANEOUS
  Administered 2021-02-03: 2 [IU] via SUBCUTANEOUS
  Administered 2021-02-03: 17:00:00 9 [IU] via SUBCUTANEOUS
  Administered 2021-02-04 (×2): 2 [IU] via SUBCUTANEOUS
  Administered 2021-02-04 – 2021-02-05 (×2): 5 [IU] via SUBCUTANEOUS
  Administered 2021-02-05 (×2): 3 [IU] via SUBCUTANEOUS
  Administered 2021-02-06: 11:00:00 2 [IU] via SUBCUTANEOUS
  Administered 2021-02-06: 13:00:00 5 [IU] via SUBCUTANEOUS
  Filled 2021-02-01 (×12): qty 1

## 2021-02-01 MED ORDER — LACTATED RINGERS IV SOLN: INTRAVENOUS | Status: DC

## 2021-02-01 MED ORDER — SODIUM CHLORIDE 0.9 % IV SOLN: 1.0000 g | Freq: Once | INTRAVENOUS | Status: DC

## 2021-02-01 MED ORDER — ATORVASTATIN CALCIUM 20 MG PO TABS
20.0000 mg | ORAL_TABLET | Freq: Every day | ORAL | Status: DC
  Administered 2021-02-02 – 2021-02-06 (×5): 20 mg via ORAL
  Filled 2021-02-01 (×5): qty 1

## 2021-02-01 MED ORDER — ALBUTEROL SULFATE (2.5 MG/3ML) 0.083% IN NEBU: 2.5000 mg | INHALATION_SOLUTION | RESPIRATORY_TRACT | Status: DC | PRN

## 2021-02-01 MED ORDER — CHLORHEXIDINE GLUCONATE CLOTH 2 % EX PADS
6.0000 | MEDICATED_PAD | Freq: Every day | CUTANEOUS | Status: DC
  Administered 2021-02-01 – 2021-02-03 (×3): 6 via TOPICAL

## 2021-02-01 MED ORDER — AZITHROMYCIN 500 MG PO TABS
500.0000 mg | ORAL_TABLET | Freq: Every day | ORAL | Status: AC
  Administered 2021-02-02 – 2021-02-06 (×5): 500 mg via ORAL
  Filled 2021-02-01 (×5): qty 1

## 2021-02-01 MED ORDER — INSULIN ASPART 100 UNIT/ML IJ SOLN
0.0000 [IU] | Freq: Every day | INTRAMUSCULAR | Status: DC
  Administered 2021-02-04 – 2021-02-05 (×2): 3 [IU] via SUBCUTANEOUS
  Filled 2021-02-01 (×2): qty 1

## 2021-02-01 MED ORDER — ACETAMINOPHEN 325 MG PO TABS: 650.0000 mg | ORAL_TABLET | Freq: Four times a day (QID) | ORAL | Status: DC | PRN

## 2021-02-01 MED ORDER — ONDANSETRON HCL 4 MG/2ML IJ SOLN: 4.0000 mg | Freq: Four times a day (QID) | INTRAMUSCULAR | Status: DC | PRN

## 2021-02-01 NOTE — H&P (Signed)
History and Physical    Louis Stone [id number] DOB: 03/22/1951 DOA: 02/01/2021  PCP: Theotis Burrow, MD  Patient coming from: Home/EMS  I have personally briefly reviewed patient's old medical records in [place]  Chief Complaint: Weakness, shortness of breath  HPI: Louis Stone is a 70 y.o. 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  Patient with nonproductive cough and feeling of soreness to his chest and muscles.  No recent sick contacts, changes in diet, or changes in his home medication regimen.  Patient lives alone.  No other complaints at this time.  Specifically denies headache, no visual changes, no nausea/vomiting/diarrhea, no fever/chills/night sweats, no palpitations, no abdominal pain, no paresthesias.  ED Course: Temperature 98.1 F, HR 94, RR 24, BP 93/59, SPO2 to 74% on room air.  WBC 11.9, hemoglobin 12.0, platelets 220.  Sodium 140, potassium 4.1, chloride 104, CO2 29, BUN 49, creatinine 0.37, glucose 123.  ABG with pH 7.38, PaCO2 40, PaO2 59.  BNP 86.8.  High sensitive troponin 34.  Lactic acid 3.4.  Cova-19 PCR negative.  Influenza A/B PCR negative.  Chest x-ray with patchy bilateral airspace disease consistent with multifocal pneumonia.  Blood cultures x2 ordered.  Patient will receive 1 L LR.  Placed initially on NRB followed by high flow nasal cannula at 55 L, now down to 8 L nasal cannula.  Patient started on antibiotics with azithromycin and cefepime.  Initially PCCM was consulted who recommended admission to hospitalist service and okay for stepdown unit.  TRH consulted for further evaluation and management of acute hypoxic respite failure secondary to multifocal pneumonia, acute renal  failure.   Constitutional - + Fatigue, No Weight Loss Vision - No impaired vision, decreased visual acuity Ear/Nose/Mouth/Throat - No decreased hearing, no congestion Respiratory - + shortness of breath, + exertional dyspnea, + cough Cardiovascular - No chest pain, no palpitations, no peripheral edema Gastrointestinal - No nausea, no diarrhea, no constipation, Genitourinary - No excessive urination, no urinary incontinence Integumentary - No rashes or concerning skin lesions Neurologic - No numbness, no tingling, no dizziness, no headaches, no confusion or memory loss   Past Medical History:  Diagnosis Date   Arthritis    per pt. I do not have md notes that indicate dx.   Diabetes mellitus without complication ([place])    Oropharynx cancer ([place]) 2009   squamous cell cancer  Stage T3, N2c, M0   Personal history of tobacco use, presenting hazards to health 10/08/2015    Past Surgical History:  Procedure Laterality Date   [place]   pt states ruptured disc and had surgery to repair it     reports that he quit smoking about 14 years ago. He has a 60.00 pack-year smoking history. He has never used smokeless tobacco. He reports that he does not drink alcohol and does not use drugs.  No Known Allergies  Family History  Problem Relation Age of Onset   CVA Father    Prostate cancer Neg Hx        not sure what family member    Family history reviewed and not pertinent   Prior to Admission medications   Medication Sig Start Date End Date Taking? Authorizing Provider  ACCU-CHEK AVIVA PLUS test strip  09/04/20  [provider]  acetaminophen (TYLENOL) 325 MG tablet Take 650 mg by mouth every 6 (six) hours as needed for headache or mild pain.    [provider]  albuterol (PROVENTIL HFA;VENTOLIN HFA) 108 (90 Base) MCG/ACT inhaler Inhale 2 puffs into the lungs every 6 (six) hours as needed for wheezing.    [provider]  atorvastatin  (LIPITOR) 20 MG tablet Take 20 mg by mouth daily. 06/06/20   [provider]  benzonatate (TESSALON) 200 MG capsule Take 200 mg by mouth 3 (three) times daily as needed for cough.    [provider]  famotidine (PEPCID) 40 MG tablet Take 40 mg by mouth daily.    [provider]  guaiFENesin-dextromethorphan (ROBITUSSIN DM) 100-10 MG/5ML syrup Take 10 mLs by mouth every 4 (four) hours as needed for cough. Patient not taking: Reported on 10/24/2020 07/21/20   Terrilee Croak, MD  lisinopril (ZESTRIL) 5 MG tablet Take 5 mg by mouth daily. 06/06/20   [provider]  omeprazole (PRILOSEC) 40 MG capsule Take 40 mg by mouth daily. 06/06/20   [provider]  ondansetron (ZOFRAN) 4 MG tablet Take 1 tablet (4 mg total) by mouth daily as needed for nausea or vomiting. 02/09/20 02/08/21  Duffy Bruce, MD  Potassium Chloride ER 20 MEQ TBCR Take 1 tablet by mouth 2 (two) times daily. 10/15/20   [provider]  TOUJEO SOLOSTAR 300 UNIT/ML Solostar Pen Inject 15 Units into the skin daily. 07/21/20   Terrilee Croak, MD    Physical Exam: Vitals:   02/01/21 1206 02/01/21 1316 02/01/21 1330 02/01/21 1400  BP: (!) 93/59 100/67 121/74 101/65  Pulse: 95 84 85 88  Resp: (!) 24 (!) 21 (!) 23 (!) 24  Temp: 98.1 F (36.7 C)     TempSrc: Oral     SpO2: (!) 74% 91% 96% 92%  Weight:      Height:        Constitutional: NAD, calm, comfortable, on 8 L nasal cannula, disheveled in appearance Eyes: PERRL, lids and conjunctivae normal ENMT: Mucous membranes are dry. Posterior pharynx clear of any exudate or lesions.poor dentition.  Neck: normal, supple, no masses, no thyromegaly Respiratory: Coarse breath sounds bilaterally, slightly decreased bilateral bases, normal Respaire effort on 8 L nasal cannula with SPO2 91%, no accessory muscle use, no wheezing. Cardiovascular: Regular rate and rhythm, no murmurs / rubs / gallops. No extremity edema. 2+ pedal pulses. No carotid  bruits.  Abdomen: no tenderness, no masses palpated. No hepatosplenomegaly. Bowel sounds positive.  Musculoskeletal: no clubbing / cyanosis. No joint deformity upper and lower extremities. Good ROM, no contractures. Normal muscle tone.  Skin: no rashes, lesions, ulcers. No induration Neurologic: CN 2-12 grossly intact. Sensation intact, DTR normal. Strength 5/5 in all 4.  Psychiatric: Poor judgment and insight. Alert and oriented x 3. Normal mood.    Labs on Admission: I have personally reviewed following labs and imaging studies  CBC: Recent Labs  Lab 02/01/21 1209  WBC 11.9*  NEUTROABS 10.6*  HGB 12.8*  HCT 39.1  MCV 84.1  PLT [id number]   Basic Metabolic Panel: Recent Labs  Lab 02/01/21 1209  NA 140  K 4.1  CL 104  CO2 24  GLUCOSE 183*  BUN 49*  CREATININE 3.07*  CALCIUM 8.7*   GFR: Estimated Creatinine Clearance: 17.9 mL/min (A) (by C-G formula based on SCr of 3.07 mg/dL (H)). Liver Function Tests: Recent Labs  Lab 02/01/21 1209  AST 30  ALT 14  ALKPHOS 63  BILITOT 1.5*  PROT 7.4  ALBUMIN 3.1*   No results for input(s): LIPASE, AMYLASE in the last 168 hours. No results for input(s): AMMONIA in the last 168 hours. Coagulation Profile: No results for input(s): INR, PROTIME in the last 168 hours. Cardiac Enzymes: No results for input(s): CKTOTAL, CKMB, CKMBINDEX, TROPONINI in the last 168 hours. BNP (last 3 results) No results for input(s): PROBNP in the last 8760 hours. HbA1C: No results for input(s): HGBA1C in the last 72 hours. CBG: No results for input(s): GLUCAP in the last 168 hours. Lipid Profile: No results for input(s): CHOL, HDL, LDLCALC, TRIG, CHOLHDL, LDLDIRECT in the last 72 hours. Thyroid Function Tests: No results for input(s): TSH, T4TOTAL, FREET4, T3FREE, THYROIDAB in the last 72 hours. Anemia Panel: No results for input(s): VITAMINB12, FOLATE, FERRITIN, TIBC, IRON, RETICCTPCT in the last 72 hours. Urine analysis:    Component Value  Date/Time   COLORURINE YELLOW 07/17/2020 0218   APPEARANCEUR HAZY (A) 07/17/2020 0218   LABSPEC >1.046 (H) 07/17/2020 0218   PHURINE 5.0 07/17/2020 0218   GLUCOSEU 150 (A) 07/17/2020 0218   HGBUR SMALL (A) 07/17/2020 0218   BILIRUBINUR NEGATIVE 07/17/2020 [place] 07/17/2020 0218   PROTEINUR 30 (A) 07/17/2020 0218   NITRITE NEGATIVE 07/17/2020 0218   LEUKOCYTESUR NEGATIVE 07/17/2020 0218    Radiological Exams on Admission: DG Chest Portable 1 View  Result Date: 02/01/2021 CLINICAL DATA:  Shortness of breath EXAM: PORTABLE CHEST 1 VIEW COMPARISON:  07/17/2020 FINDINGS: Patchy bilateral airspace disease most compatible with pneumonia. Heart is normal size. No effusions or acute bony abnormality. IMPRESSION: Patchy bilateral airspace disease concerning for pneumonia. Electronically Signed   By: Rolm Baptise M.D.   On: 02/01/2021 12:31    EKG: Independently reviewed.   Assessment/Plan Principal Problem:   Severe sepsis (HCC) Active Problems:   Mixed diabetic hyperlipidemia associated with type 2 diabetes mellitus ([place])   Uncontrolled type 2 diabetes mellitus with hypoglycemia without coma, with long-term current use of insulin (HCC)   GERD without esophagitis   Acute respiratory failure with hypoxia (HCC)   Squamous cell carcinoma of oropharynx (HCC)   Acute renal failure (ARF) (HCC)    Severe sepsis, POA Acute hypoxic respiratory failure, POA Multifocal pneumonia Hypotension, responsive to fluid resuscitation Lactic acidosis Patient presenting to ED via EMS with 4-day history of progressive weakness/fatigue, shortness of breath with nonproductive cough.  On EMS arrival, patient was hypoxic with SPO2 74%.  Not oxygen dependent at baseline.  No history of COPD.  Patient with slightly elevated respiratory rate of 24, hypotensive with a lactic acid of 3.4 and leukocytosis of 11.9.  Patient also with acute renal failure.  ABG with PaCO2 low at 59.  COVID/flu negative.   Chest x-ray with patchy bilateral airspace disease consistent with multifocal pneumonia.  Initially placed on nonrebreather followed by heated high flow nasal cannula at 55 L.  Seen by PCCM, recommend admission to stepdown unit under hospitalist service. --Admit to inpatient, stepdown --Check procalcitonin --strep and Legionella urinary antigens --Respiratory culture --Blood cultures x2: Pending --Check urinalysis/urine culture --IV fluid bolus with LR 30 cc/kg followed by LR infusion --Azithromycin 500 mg p.o. daily x5 days --Ceftriaxone 2 g IV every 24 hours --Continue supplemental oxygen, maintain SPO2 greater than 92%, currently on 8 L nasal cannula --Continue to trend lactic acid --Supportive care, CBC daily  Acute renal failure Creatinine 3.07 on admission.  Baseline 1.02 on 07/21/2020.  Etiology likely secondary to prerenal azotemia  from dehydration from underlying sepsis as above versus ATN given hypotension on admission. --Check urine sodium/urine creatinine --Received 1 L LR bolus on admission, repeat 1 L bolus to complete 30 cc/kg fluid resuscitation followed by LR infusion. --Hold home lisinopril --Repeat BMP in a.m., if not improved would recommend renal ultrasound at that time --Avoid nephrotoxins, renally dose all medications --Strict I's and O's  Elevated troponin Initial troponin elevated at 34, suspect likely type II demand ischemia from underlying sepsis/hypotension as above.  Currently denies any chest discomfort. EKG with NSR, rate 96, QTc 484.  No concerning ST elevation/depression or T wave inversion; no dynamic changes. --Continue to trend troponin --Monitor on telemetry --Treatment as above for underlying sepsis/multifocal pneumonia  Type 2 diabetes mellitus Patient reports on Lantus 25 units every morning.  Reports took dose this morning. --We will hold long-acting insulin for now until pharmacy reconciliation --Check hemoglobin A1c --Sensitive insulin  sliding scale for further coverage --CBG before every meal/at bedtime  Hyperlipidemia: Continue statin  GERD --Resume home medications following pharmacy reconciliation of home meds  Weakness/fatigue: Patient reports several falls at home.  Lives alone. --PT/OT evaluation for discharge needs  History of stage IV adenocarcinoma oropharynx/tonsils Follows with medical oncology outpatient, Dr. Tasia Catchings and ENT, Dr. Sharol Roussel.  Status post chemo radiation. --Outpatient follow-up.    DVT prophylaxis: Heparin Code Status: Full code Family Communication: No family present at bedside this morning Disposition Plan: Anticipate discharge home when medically stable Consults called: None Admission status: Inpatient Level of care: Stepdown   Severity of Illness: The appropriate patient status for this patient is INPATIENT. Inpatient status is judged to be reasonable and necessary in order to provide the required intensity of service to ensure the patient's safety. The patient's presenting symptoms, physical exam findings, and initial radiographic and laboratory data in the context of their chronic comorbidities is felt to place them at high risk for further clinical deterioration. Furthermore, it is not anticipated that the patient will be medically stable for discharge from the hospital within 2 midnights of admission. The following factors support the patient status of inpatient.   " The patient's presenting symptoms include weakness, shortness of breath, fatigue " The worrisome physical exam findings include hypoxia, slightly increased work of breathing at initial presentation " The initial radiographic and laboratory data are worrisome because of leukocytosis, increased creatinine, low PaCO2 on ABG, chest x-ray with multifocal pneumonia " The chronic co-morbidities include essential hypertension, type 2 diabetes mellitus, history of oropharyngeal adenocarcinoma.   * I certify that at the point of  admission it is my clinical judgment that the patient will require inpatient hospital care spanning beyond 2 midnights from the point of admission due to high intensity of service, high risk for further deterioration and high frequency of surveillance required.*    Kearia Yin J [place] DO [facility] Available via Epic secure chat 7am-7pm After these hours, please refer to coverage provider listed on [email or web address] 02/01/2021, 2:50 PM

## 2021-02-01 NOTE — ED Triage Notes (Signed)
Pt to ED via EMS from home. C/o Weakness and SOB for the past 2 days. EMS reports O2 sat of 74% on RA, put him on 6L [place] at 83%. Pt hypotensive with 80/56. Pt A&O x4   Hx of throat and tongue cancer and diabetes

## 2021-02-01 NOTE — ED Notes (Signed)
RT called at this time to obtain ABG and place pt on high flow [place].

## 2021-02-01 NOTE — ED Provider Notes (Addendum)
[facility] [place] Emergency Department Provider Note   ____________________________________________   Event Date/Time   First MD Initiated Contact with Patient 02/01/21 1205     (approximate)  I have reviewed the triage vital signs and the nursing notes.   HISTORY  Chief Complaint Hypotension and Shortness of Breath    HPI Louis Stone is a 70 y.o. male with past medical history of hypertension, hyperlipidemia, diabetes, GERD, and oropharyngeal cancer who presents to the ED complaining of shortness of breath.  Per EMS, patient called out for increasing weakness, malaise, and trouble breathing over the past 3 to 4 days.  He denies any fevers or cough, does state that he has been feeling sore in his chest and his muscles diffusely.  He is not aware of any sick contacts, denies vomiting, diarrhea, or abdominal pain.  He states he has received 3 doses of the COVID-19 vaccine, however similar symptoms in January when he tested positive for COVID.        Past Medical History:  Diagnosis Date   Arthritis    per pt. I do not have md notes that indicate dx.   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, M0   Personal history of tobacco use, presenting hazards to health 10/08/2015    Patient Active Problem List   Diagnosis Date Noted   Acute left-sided weakness [id number]   Acute metabolic encephalopathy [id number]   Mixed diabetic hyperlipidemia associated with type 2 diabetes mellitus ([place]) 07/16/2020   Uncontrolled type 2 diabetes mellitus with hypoglycemia without coma, with long-term current use of insulin ([place]) 07/16/2020   COVID-19 virus infection 07/16/2020   GERD without esophagitis 07/16/2020   Bradycardia 07/16/2020   Sepsis ([place]) 09/16/2016   Personal history of tobacco use, presenting hazards to health 10/08/2015    Past Surgical History:  Procedure Laterality Date    [place]   pt states ruptured disc and had surgery to repair it    Prior to Admission medications   Medication Sig Start Date End Date Taking? Authorizing Provider  ACCU-CHEK AVIVA PLUS test strip  09/04/20   [provider]  acetaminophen (TYLENOL) 325 MG tablet Take 650 mg by mouth every 6 (six) hours as needed for headache or mild pain.    [provider]  albuterol (PROVENTIL HFA;VENTOLIN HFA) 108 (90 Base) MCG/ACT inhaler Inhale 2 puffs into the lungs every 6 (six) hours as needed for wheezing.    [provider]  atorvastatin (LIPITOR) 20 MG tablet Take 20 mg by mouth daily. 06/06/20   [provider]  benzonatate (TESSALON) 200 MG capsule Take 200 mg by mouth 3 (three) times daily as needed for cough.    [provider]  famotidine (PEPCID) 40 MG tablet Take 40 mg by mouth daily.    [provider]  guaiFENesin-dextromethorphan (ROBITUSSIN DM) 100-10 MG/5ML syrup Take 10 mLs by mouth every 4 (four) hours as needed for cough. Patient not taking: Reported on 10/24/2020 07/21/20   Terrilee Croak, MD  lisinopril (ZESTRIL) 5 MG tablet Take 5 mg by mouth daily. 06/06/20   [provider]  omeprazole (PRILOSEC) 40 MG capsule Take 40 mg by mouth daily. 06/06/20   [provider]  ondansetron (ZOFRAN) 4 MG tablet Take 1 tablet (4 mg total) by mouth daily as needed for nausea or vomiting. 02/09/20 02/08/21  Duffy Bruce, MD  Potassium Chloride  ER 20 MEQ TBCR Take 1 tablet by mouth 2 (two) times daily. 10/15/20   [provider]  TOUJEO SOLOSTAR 300 UNIT/ML Solostar Pen Inject 15 Units into the skin daily. 07/21/20   Terrilee Croak, MD    Allergies Patient has no known allergies.  Family History  Problem Relation Age of Onset   CVA Father    Prostate cancer Neg Hx        not sure what family member    Social History Social History   Tobacco Use   Smoking status: Former    Packs/day: 1.50    Years:  40.00    Pack years: 60.00    Types: Cigarettes    Quit date: 07/05/2006    Years since quitting: 14.5   Smokeless tobacco: Never  Substance Use Topics   Alcohol use: No    Comment: quit in 1991   Drug use: No    Review of Systems  Constitutional: No fever/chills Eyes: No visual changes. ENT: No sore throat. Cardiovascular: Positive for chest pain. Respiratory: Positive for cough and shortness of breath. Gastrointestinal: No abdominal pain.  No nausea, no vomiting.  No diarrhea.  No constipation. Genitourinary: Negative for dysuria. Musculoskeletal: Negative for back pain. Skin: Negative for rash. Neurological: Negative for headaches, focal weakness or numbness.  ____________________________________________   PHYSICAL EXAM:  VITAL SIGNS: ED Triage Vitals  Enc Vitals Group     BP --      Pulse --      Resp --      Temp --      Temp src --      SpO2 --      Weight 02/01/21 1202 121 lb (54.9 kg)     Height 02/01/21 1202 '5\' 3"'$  (1.6 m)     Head Circumference --      Peak Flow --      Pain Score 02/01/21 1201 6     Pain Loc --      Pain Edu? --      Excl. in [place]? --     Constitutional: Alert and oriented. Eyes: Conjunctivae are normal. Head: Atraumatic. Nose: No congestion/rhinnorhea. Mouth/Throat: Mucous membranes are moist. Neck: Normal ROM Cardiovascular: Normal rate, regular rhythm. Grossly normal heart sounds.  2+ radial pulses bilaterally. Respiratory: Mildly tachypneic with increased respiratory effort.  No retractions. Lungs CTAB. Gastrointestinal: Soft and nontender. No distention. Genitourinary: deferred Musculoskeletal: No lower extremity tenderness nor edema. Neurologic:  Normal speech and language. No gross focal neurologic deficits are appreciated. Skin:  Skin is warm, dry and intact. No rash noted. Psychiatric: Mood and affect are normal. Speech and behavior are normal.  ____________________________________________   LABS (all labs ordered are  listed, but only abnormal results are displayed)  Labs Reviewed  CBC WITH DIFFERENTIAL/PLATELET - Abnormal; Notable for the following components:      Result Value   WBC 11.9 (*)    Hemoglobin 12.8 (*)    Neutro Abs 10.6 (*)    Lymphs Abs 0.5 (*)    Abs Immature Granulocytes 0.09 (*)    All other components within normal limits  COMPREHENSIVE METABOLIC PANEL - Abnormal; Notable for the following components:   Glucose, Bld 183 (*)    BUN 49 (*)    Creatinine, Ser 3.07 (*)    Calcium 8.7 (*)    Albumin 3.1 (*)    Total Bilirubin 1.5 (*)    GFR, Estimated 21 (*)    All other components within normal limits  BLOOD GAS, ARTERIAL - Abnormal; Notable for the following components:   pO2, Arterial 59 (*)    All other components within normal limits  TROPONIN I (HIGH SENSITIVITY) - Abnormal; Notable for the following components:   Troponin I (High Sensitivity) 34 (*)    All other components within normal limits  RESP PANEL BY RT-PCR (FLU A&B, COVID) ARPGX2  CULTURE, BLOOD (ROUTINE X 2)  CULTURE, BLOOD (ROUTINE X 2)  COOXEMETRY PANEL  BRAIN NATRIURETIC PEPTIDE  LACTIC ACID, PLASMA  LACTIC ACID, PLASMA   ____________________________________________  EKG  ED ECG REPORT I, Blake Divine, the attending physician, personally viewed and interpreted this ECG.   Date: 02/01/2021  EKG Time: 12:02  Rate: 96  Rhythm: normal sinus rhythm  Axis: Normal  Intervals:none  ST&T Change: Nonspecific T wave changes, lateral leads   PROCEDURES  Procedure(s) performed (including Critical Care):  .Critical Care  Date/Time: 02/01/2021 12:14 PM Performed by: Blake Divine, MD Authorized by: Blake Divine, MD   Critical care provider statement:    Critical care time (minutes):  45   Critical care time was exclusive of:  Separately billable procedures and treating other patients and teaching time   Critical care was necessary to treat or prevent imminent or life-threatening deterioration  of the following conditions:  Respiratory failure   Critical care was time spent personally by me on the following activities:  Discussions with consultants, evaluation of patient's response to treatment, examination of patient, ordering and performing treatments and interventions, ordering and review of laboratory studies, ordering and review of radiographic studies, pulse oximetry, re-evaluation of patient's condition, obtaining history from patient or surrogate and review of old charts   I assumed direction of critical care for this patient from another provider in my specialty: no     Care discussed with: admitting provider     ____________________________________________   INITIAL IMPRESSION / ASSESSMENT AND PLAN / ED COURSE      70 year old male with past medical history of hypertension, hyperlipidemia, diabetes, GERD, and oropharyngeal cancer who presents to the ED complaining of cough, shortness of breath, chest soreness, and muscle aches for the past 3 to 4 days.  Patient is mildly tachypneic with increased respiratory effort, also noted to be hypoxic into the mid 70s on room air.  He was placed on 6 L nasal cannula without significant improvement, although his hypoxia seems out of proportion for his work of breathing.  We will start patient on high flow nasal cannula and have respiratory perform ABG to confirm hypoxia.  EKG shows no evidence of arrhythmia or ischemia, labs and chest x-ray are pending, we will also perform testing for COVID-19.  Chest x-ray reviewed by me and is concerning for multifocal pneumonia, although testing for COVID-19 and influenza is negative.  We will start patient on cefepime and azithromycin, draw blood cultures and lactate.  Patient continues to be hypoxic despite high flow nasal cannula and nonrebreather over top, O2 sats currently at 89 to 90%.  ABG was drawn and showed patient to have an O2 sat of 89% while he was on nonrebreather without nasal cannula,  which was a significant discrepancy from pulse oximeter that was reading at 81%.  Cooximeter he was drawn and shows no evidence of methemoglobinemia or other abnormality.  Patient's work of breathing remains relatively reassuring in comparison to his hypoxia.  Given his significant respiratory failure, case discussed with ICU provider for admission.  ----------------------------------------- 2:13 PM on 02/01/2021 ----------------------------------------- Patient evaluated by Dr. Tacy Learn  from the ICU.  His respiratory status now appears to be improving, and he is maintaining O2 sats of 92% on 6 L nasal cannula, work of breathing remains mildly increased.  Per Dr. Tacy Learn, patient is appropriate for admission to stepdown and we will discuss with hospitalist.      ____________________________________________   FINAL CLINICAL IMPRESSION(S) / ED DIAGNOSES  Final diagnoses:  Acute respiratory failure with hypoxia ([place])  Community acquired pneumonia, unspecified laterality     ED Discharge Orders     None        Note:  This document was prepared using Dragon voice recognition software and may include unintentional dictation errors.    Blake Divine, MD 02/01/21 1313    Blake Divine, MD 02/01/21 1414

## 2021-02-01 NOTE — ED Notes (Signed)
X-Ray at bedside.

## 2021-02-01 NOTE — ED Notes (Signed)
Pt sat 78% on 6L. Verbal order to placed pt on NRB at 15L by Dr. Charna Archer, [place]. O2 increased only to 82%.

## 2021-02-01 NOTE — Consult Note (Signed)
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  In the emergency department patient was noted to be hypoxic to mid 70s on room air, immediately was put on nonrebreather with O2 sat coming up to 80s, was started on high flow nasal cannula oxygen and nonrebreather, PCCM was consulted for evaluation and help with management  During my evaluation patient was on high flow nasal cannula oxygen and nonrebreather, I remove nonrebreather patient sats remain in high 90s, I turned off high flow nasal cannula oxygen initially patient's O2 sat remained in the 90s then slowly came down to mid 80s, he was started on regular nasal cannula oxygen 6 L with O2 sat in mid 90s.  Pertinent  Medical History   Past Medical History:  Diagnosis Date   Arthritis    per pt. I do not have md notes that indicate dx.   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 rate (!) 24, height '5\' 3"'$  (1.6 m), weight 54.9 kg, SpO2 92 %.    FiO2 (%):  [100 %] 100 %   Intake/Output Summary (Last 24 hours) at 02/01/2021 1554 Last data filed at 02/01/2021 1404 Gross per 24 hour  Intake  1350 ml  Output --  Net 1350 ml   Filed Weights   02/01/21 1202  Weight: 54.9 kg    Examination:   Physical exam: General: Acute on ill-appearing elderly African-American male, lying on the bed HEENT: [place]/AT, eyes anicteric.  Moist mucous membranes Neuro: Alert, awake, oriented x3, dysarthric speech likely because of chemoradiation of oropharynx Chest: Bilateral faint basal crackles, no wheezes or rhonchi Heart: Regular rate and rhythm, no murmurs or gallops Abdomen: Soft, nontender, nondistended, bowel sounds present Skin: No rash    Resolved Hospital Problem list     Assessment & Plan:  Acute hypoxic respiratory failure with differential due to multifocal pneumonia versus acute PE Acute kidney injury [place] cardiac ischemia Dehydration with lactic acidosis  Continue nasal cannula oxygen, patient is able to maintain O2 sat in mid 90s Continue IV antibiotics Patient does not look tachycardic, bedside echocardiogram did not show right-sided dilatation and hypokinetic, it is less likely massive PE causing his hypoxia I think multifocal pneumonia is more likely in this situation Follow-up respiratory culture Trend lactate Continue IV fluid resuscitation, monitor serum creatinine and electrolytes Trend troponin  As patient is requiring nasal cannula oxygen, he looks comfortable, not in acute distress would recommend admission to stepdown unit under hospitalist care.  At this time patient does not require ICU level of care.  Please call if patient condition changes  Best Practice (right click and "Reselect all SmartList Selections" daily)   Diet/type: clear liquids DVT prophylaxis: prophylactic heparin  GI prophylaxis: N/A Lines: N/A Foley:  N/A Code Status:  full code Last date of multidisciplinary goals of care discussion [Per primary team]  Labs   CBC: Recent Labs  Lab 02/01/21 1209  WBC 11.9*  NEUTROABS 10.6*  HGB 12.8*  HCT 39.1  MCV 84.1  PLT 220     Basic Metabolic Panel: Recent Labs  Lab 02/01/21 1209  NA 140  K 4.1  CL 104  CO2 24  GLUCOSE 183*  BUN 49*  CREATININE 3.07*  CALCIUM 8.7*   GFR: Estimated Creatinine Clearance: 17.9 mL/min (A) (by C-G formula based on SCr of 3.07 mg/dL (H)). Recent Labs  Lab 02/01/21 1209 02/01/21 1248 02/01/21 1418  WBC 11.9*  --   --   LATICACIDVEN  --  3.4* 2.9*    Liver Function Tests: Recent Labs  Lab 02/01/21 1209  AST 30  ALT 14  ALKPHOS 63  BILITOT 1.5*  PROT 7.4  ALBUMIN 3.1*   No results for input(s): LIPASE, AMYLASE in the last 168 hours. No results for input(s): AMMONIA in the last 168 hours.  ABG    Component Value Date/Time   PHART 7.38 02/01/2021 1209   PCO2ART 40 02/01/2021 1209   PO2ART 59 (L) 02/01/2021 1209   HCO3 23.7 02/01/2021 1209   TCO2 35 (H) 07/17/2020 0038   ACIDBASEDEF 1.3 02/01/2021 1209   O2SAT 88.6 02/01/2021 1225     Coagulation Profile: No results for input(s): INR, PROTIME in the last 168 hours.  Cardiac Enzymes: No results for input(s): CKTOTAL, CKMB, CKMBINDEX, TROPONINI in the last 168 hours.  HbA1C: Hgb A1c MFr Bld  Date/Time Value Ref Range Status  07/20/2020 03:25 AM 6.6 (H) 4.8 - 5.6 % Final    Comment:    (NOTE) Pre diabetes:          5.7%-6.4%  Diabetes:              >6.4%  Glycemic control for   <7.0% adults with diabetes   07/17/2020 05:09 AM 6.3 (H) 4.8 - 5.6 % Final    Comment:    (NOTE) Pre diabetes:          5.7%-6.4%  Diabetes:              >6.4%  Glycemic control for   <7.0% adults with diabetes     CBG: No results for input(s): GLUCAP in the last 168 hours.  Review of Systems:   12 point review of system is significant for complaint mentioned in HPI, rest is negative  Past Medical History:  He,  has a past medical history of Arthritis, Diabetes mellitus without complication ([place]), Oropharynx cancer ([place]) (2009), and Personal history of tobacco use, presenting hazards to health  (10/08/2015).   Surgical History:   Past Surgical History:  Procedure Laterality Date   THORACIC [place]   pt states ruptured disc and had surgery to repair it     Social History:   reports that he quit smoking about 14 years ago. He has a 60.00 pack-year smoking history. He has never used smokeless tobacco. He reports that he does not drink alcohol and does not use drugs.   Family History:  His family history includes CVA in his father. There is no history of Prostate cancer.   Allergies No Known Allergies   Home Medications  Prior to Admission medications   Medication Sig Start Date End Date Taking? Authorizing Provider  atorvastatin (LIPITOR) 20 MG tablet Take 20  mg by mouth daily. 06/06/20  Yes [provider]  famotidine (PEPCID) 40 MG tablet Take 40 mg by mouth daily.   Yes [provider]  lisinopril (ZESTRIL) 5 MG tablet Take 5 mg by mouth daily. 06/06/20  Yes [provider]  omeprazole (PRILOSEC) 40 MG capsule Take 40 mg by mouth daily. 06/06/20  Yes [provider]  Potassium Chloride ER 20 MEQ TBCR Take 1 tablet by mouth 2 (two) times daily. 10/15/20  Yes [provider]  TOUJEO SOLOSTAR 300 UNIT/ML Solostar Pen Inject 15 Units into the skin daily. 07/21/20  Yes Dahal, Marlowe Aschoff, MD  ACCU-CHEK AVIVA PLUS test strip  09/04/20   [provider]  acetaminophen (TYLENOL) 325 MG tablet Take 650 mg by mouth every 6 (six) hours as needed for headache or mild pain.    [provider]  albuterol (PROVENTIL HFA;VENTOLIN HFA) 108 (90 Base) MCG/ACT inhaler Inhale 2 puffs into the lungs every 6 (six) hours as needed for wheezing.    [provider]  benzonatate (TESSALON) 200 MG capsule Take 200 mg by mouth 3 (three) times daily as needed for cough.    [provider]  guaiFENesin-dextromethorphan (ROBITUSSIN DM) 100-10 MG/5ML syrup Take 10 mLs by mouth every 4 (four) hours as needed for cough. Patient not  taking: No sig reported 07/21/20   Terrilee Croak, MD  ondansetron (ZOFRAN) 4 MG tablet Take 1 tablet (4 mg total) by mouth daily as needed for nausea or vomiting. 02/09/20 02/08/21  Duffy Bruce, MD     Total critical care time: 35 minutes  Performed by: [place] care time was exclusive of separately billable procedures and treating other patients.   Critical care was necessary to treat or prevent imminent or life-threatening deterioration.   Critical care was time spent personally by me on the following activities: development of treatment plan with patient and/or surrogate as well as nursing, discussions with consultants, evaluation of patient's response to treatment, examination of patient, obtaining history from patient or surrogate, ordering and performing treatments and interventions, ordering and review of laboratory studies, ordering and review of radiographic studies, pulse oximetry and re-evaluation of patient's condition.   Jacky Kindle MD [facility] See Amion for pager If no response to pager, please call [phone] until 7pm After 7pm, Please call E-link [phone]

## 2021-02-01 NOTE — Sepsis Progress Note (Signed)
[facility] sepsis surveillance completed.

## 2021-02-01 NOTE — ED Notes (Signed)
Hospitalist and RT at bedside, pt taken off HFNC at this time. Pt placed on 4L pt current sat 86%.

## 2021-02-02 ENCOUNTER — Encounter: Payer: Medicare Other | Admitting: Speech Pathology

## 2021-02-02 DIAGNOSIS — A419 Sepsis, unspecified organism: Secondary | ICD-10-CM | POA: Diagnosis not present

## 2021-02-02 DIAGNOSIS — J181 Lobar pneumonia, unspecified organism: Secondary | ICD-10-CM

## 2021-02-02 DIAGNOSIS — B888 Other specified infestations: Secondary | ICD-10-CM | POA: Diagnosis not present

## 2021-02-02 DIAGNOSIS — E43 Unspecified severe protein-calorie malnutrition: Secondary | ICD-10-CM | POA: Insufficient documentation

## 2021-02-02 DIAGNOSIS — R6520 Severe sepsis without septic shock: Secondary | ICD-10-CM | POA: Diagnosis not present

## 2021-02-02 LAB — GLUCOSE, CAPILLARY
Glucose-Capillary: 62 mg/dL — ABNORMAL LOW (ref 70–99)
Glucose-Capillary: 49 mg/dL — ABNORMAL LOW (ref 70–99)
Glucose-Capillary: 53 mg/dL — ABNORMAL LOW (ref 70–99)
Glucose-Capillary: 92 mg/dL (ref 70–99)
Glucose-Capillary: 107 mg/dL — ABNORMAL HIGH (ref 70–99)
Glucose-Capillary: 144 mg/dL — ABNORMAL HIGH (ref 70–99)
Glucose-Capillary: 189 mg/dL — ABNORMAL HIGH (ref 70–99)

## 2021-02-02 LAB — BLOOD CULTURE ID PANEL (REFLEXED) - BCID2

## 2021-02-02 LAB — BASIC METABOLIC PANEL
Anion gap: 7 (ref 5–15)
BUN: 44 mg/dL — ABNORMAL HIGH (ref 8–23)
CO2: 26 mmol/L (ref 22–32)
Calcium: 8.3 mg/dL — ABNORMAL LOW (ref 8.9–10.3)
Chloride: 103 mmol/L (ref 98–111)
Creatinine, Ser: 1.6 mg/dL — ABNORMAL HIGH (ref 0.61–1.24)
GFR, Estimated: 47 mL/min — ABNORMAL LOW (ref >60)
Glucose, Bld: 116 mg/dL — ABNORMAL HIGH (ref 70–99)
Potassium: 4.3 mmol/L (ref 3.5–5.1)
Sodium: 136 mmol/L (ref 135–145)

## 2021-02-02 LAB — CBC
HCT: 34.7 % — ABNORMAL LOW (ref 39.0–52.0)
Hemoglobin: 11.5 g/dL — ABNORMAL LOW (ref 13.0–17.0)
MCH: 27.4 pg (ref 26.0–34.0)
MCHC: 33.1 g/dL (ref 30.0–36.0)
MCV: 82.8 fL (ref 80.0–100.0)
Platelets: 195 10*3/uL (ref 150–400)
RBC: 4.19 MIL/uL — ABNORMAL LOW (ref 4.22–5.81)
RDW: 14.7 % (ref 11.5–15.5)
WBC: 14.3 10*3/uL — ABNORMAL HIGH (ref 4.0–10.5)
nRBC: 0 % (ref 0.0–0.2)

## 2021-02-02 LAB — HEMOGLOBIN A1C
Hgb A1c MFr Bld: 6.8 % — ABNORMAL HIGH (ref 4.8–5.6)
Mean Plasma Glucose: 148.46 mg/dL

## 2021-02-02 LAB — PROTIME-INR
INR: 1.4 — ABNORMAL HIGH (ref 0.8–1.2)
Prothrombin Time: 17.4 seconds — ABNORMAL HIGH (ref 11.4–15.2)

## 2021-02-02 LAB — LACTIC ACID, PLASMA
Lactic Acid, Venous: 1.4 mmol/L (ref 0.5–1.9)
Lactic Acid, Venous: 1.8 mmol/L (ref 0.5–1.9)

## 2021-02-02 LAB — APTT: aPTT: 38 seconds — ABNORMAL HIGH (ref 24–36)

## 2021-02-02 MED ORDER — SODIUM CHLORIDE 0.9 % IV SOLN
INTRAVENOUS | Status: DC | PRN
  Administered 2021-02-02: 18:00:00 250 mL via INTRAVENOUS

## 2021-02-02 MED ORDER — FAMOTIDINE 20 MG PO TABS
40.0000 mg | ORAL_TABLET | Freq: Every day | ORAL | Status: DC
  Administered 2021-02-02 – 2021-02-06 (×5): 40 mg via ORAL
  Filled 2021-02-02 (×5): qty 2

## 2021-02-02 MED ORDER — ENSURE ENLIVE PO LIQD
237.0000 mL | Freq: Three times a day (TID) | ORAL | Status: DC
  Administered 2021-02-02 – 2021-02-06 (×12): 237 mL via ORAL

## 2021-02-02 MED ORDER — ADULT MULTIVITAMIN W/MINERALS CH
1.0000 | ORAL_TABLET | Freq: Every day | ORAL | Status: DC
  Administered 2021-02-03 – 2021-02-06 (×4): 1 via ORAL
  Filled 2021-02-02 (×4): qty 1

## 2021-02-02 MED ORDER — PERMETHRIN 5 % EX CREA
TOPICAL_CREAM | Freq: Once | CUTANEOUS | Status: AC
  Filled 2021-02-02: qty 60

## 2021-02-02 MED ORDER — LACTATED RINGERS IV BOLUS
1000.0000 mL | Freq: Once | INTRAVENOUS | Status: AC
  Administered 2021-02-02: 1000 mL via INTRAVENOUS

## 2021-02-02 NOTE — Evaluation (Signed)
Physical Therapy Evaluation Patient Details Name: Louis Stone MRN: [id number] DOB: [birth year] Today's Date: 02/02/2021   History of Present Illness  Louis Stone is a 26yoM who comes to [facility] on 02/01/21 c weakness and SOB x 4 days. PMH: DM2, GERD, HTN, HLD, StgIV SCC oropharynx/tonsils s/p chemoradiation. COVID/FLu (-). Pt admitted with severe sepsis 2/2 multifocal PNA, admitted to SDU requriing suuplemental O2.  Clinical Impression  Pt admitted with above diagnosis. Pt currently with functional limitations due to the deficits listed below (see "PT Problem List"). Upon entry, pt in bed, awake and agreeable to participate. The pt is alert, pleasant, interactive, and able to provide info regarding prior level of function, both in tolerance and independence. Pt denies any substantial weakness, but endorses mild unsteadiness compared to baseline. Pt would be safe to use a device for AMB until back 100%. No assist, no apparent weakness with bed mobility and transfers. Pt AMB 136f with IV pole support, then 652funsupported at minGuard assist. Remained in room due to presence of unidentified domestic parasite found on patient by NSG. Patient's performance this date reveals decreased ability, independence, and tolerance in performing all basic mobility required for performance of activities of daily living. Pt requires additional DME, close physical assistance, and cues for safe participate in mobility. Pt will benefit from skilled PT intervention to increase independence and safety with basic mobility in preparation for discharge to the venue listed below.  Does not sound as though pt has been able to perform his IADL/home care PTA, home unsafe for return at present. Pt would benefit from STR for IADL training, balance training prior to transition to home setting.      Follow Up Recommendations Supervision - Intermittent;SNF    Equipment Recommendations  None recommended by PT (pt has a RW;  [facility]   Recommendations for Other Services       Precautions / Restrictions Precautions Precautions: Fall Restrictions Weight Bearing Restrictions: No      Mobility  Bed Mobility Overal bed mobility: Independent                  Transfers Overall transfer level: Independent                  Ambulation/Gait   Gait Distance (Feet): 200 Feet Assistive device: IV Pole;None       General Gait Details: supervision for  Stairs            Wheelchair Mobility    Modified Rankin (Stroke Patients Only)       Balance Overall balance assessment: Mild deficits observed, not formally tested;Modified Independent                                           Pertinent Vitals/Pain Pain Assessment: No/denies pain    Home Living Family/patient expects to be discharged to:: Private residence Living Arrangements: Alone Available Help at Discharge: Family;Available PRN/intermittently Type of Home: House Home Access: Ramped entrance     Home Layout: One level Home Equipment: Walker - 2 wheels Additional Comments: Step-DTRs available as needed for support    Prior Function Level of Independence: Independent with assistive device(s)         Comments: uses RW "as needed". Reports he perfroms ADLs, IADLs,and drives     Hand Dominance   Dominant Hand: Right    Extremity/Trunk  Assessment                Communication   Communication: HOH  Cognition Arousal/Alertness: Awake/alert Behavior During Therapy: WFL for tasks assessed/performed Overall Cognitive Status: Within Functional Limits for tasks assessed                                        General Comments      Exercises     Assessment/Plan    PT Assessment Patient needs continued PT services  PT Problem List Decreased strength;Decreased activity tolerance;Decreased balance;Decreased mobility       PT Treatment Interventions  Balance training;DME instruction;Gait training;Neuromuscular re-education;Functional mobility training;Therapeutic activities;Therapeutic exercise;Patient/family education    PT Goals (Current goals can be found in the Care Plan section)  Acute Rehab PT Goals Patient Stated Goal: return to baseline strength, balance PT Goal Formulation: With patient Time For Goal Achievement: 02/16/21 Potential to Achieve Goals: Good    Frequency Min 2X/week   Barriers to discharge Inaccessible home environment;Decreased caregiver support      Co-evaluation               AM-PAC PT "6 Clicks" Mobility  Outcome Measure Help needed turning from your back to your side while in a flat bed without using bedrails?: None Help needed moving from lying on your back to sitting on the side of a flat bed without using bedrails?: None Help needed moving to and from a bed to a chair (including a wheelchair)?: None Help needed standing up from a chair using your arms (e.g., wheelchair or bedside chair)?: None Help needed to walk in hospital room?: A Little Help needed climbing 3-5 steps with a railing? : A Little 6 Click Score: 22    End of Session   Activity Tolerance: Patient tolerated treatment well;Other (comment) Patient left: in bed;with bed alarm set;with call bell/phone within reach Nurse Communication: Mobility status PT Visit Diagnosis: Unsteadiness on feet (R26.81);Other abnormalities of gait and mobility (R26.89)    Time: [id number] PT Time Calculation (min) (ACUTE ONLY): 20 min   Charges:   PT Evaluation $PT Eval Moderate Complexity: 1 Mod PT Treatments $Therapeutic Exercise: 8-22 mins       12:21 PM, 02/02/21 Etta Grandchild, PT, DPT Physical Therapist - [facility]  [phone] ([place])    [place] C 02/02/2021, 12:15 PM

## 2021-02-02 NOTE — Progress Notes (Signed)
PROGRESS NOTE    Louis Stone  [id number] DOB: Aug 23, 1950 DOA: 02/01/2021 PCP: Theotis Burrow, MD   Assessment & Plan:   Principal Problem:   Severe sepsis [facility]) Active Problems:   Mixed diabetic hyperlipidemia associated with type 2 diabetes mellitus ([place])   Uncontrolled type 2 diabetes mellitus with hypoglycemia without coma, with long-term current use of insulin (HCC)   GERD without esophagitis   Acute respiratory failure with hypoxia (HCC)   Squamous cell carcinoma of oropharynx (HCC)   Acute renal failure (ARF) ([place])   Acute hypoxic respiratory failure: secondary to multifocal pneumonia. Continue on IV rocephin, azithromycin. Continue on bronchodilators. Procal is pending. Blood cx NGTD. Continue on supplemental oxygen and wean as tolerated  Severe sepsis: met criteria w/ tachypnea, elevated lactic acid, leukocytosis, hypotension & pneumonia. Continue on IVFs and IV abxs. Blood cxs NGTD  Multifocal pneumonia: continue on IV rocephin, azithromycin & bronchodialtors. Strep, legionella ordered. Continue on supplemental oxygen and wean as tolerated   Bed bugs: w/ infestation of roaches as well at pt's home. APS was called as per speech. See speech's note for more information. Permethrin ordered. Continue on contact precautions  Leukocytosis: likely secondary to above infection. Continue on IV abxs   Lactic acidosis: resolved  AKI: baseline Cr is 1.02. Likely secondary to dehydration from underlying sepsis vs ATN given hypotension on admission. Continue on IVFs. Continue to hold home dose of lisinopril  Elevated troponin: likely secondary to demand ischemia  DM2: likely poorly controlled. Continue glargine, SSI w/ accuchecks. 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 Management per onco & ENT   Severe protein  calorie malnutrition: will consult nutrition   DVT prophylaxis: heparin  Code Status:  full  Family Communication:  Disposition Plan: unclear  Level of care: Stepdown  Status is: Inpatient Remains inpatient appropriate because:Ongoing diagnostic testing needed not appropriate for outpatient work up, Unsafe d/c plan, IV treatments appropriate due to intensity of illness or inability to take PO, and Inpatient level of care appropriate due to severity of illness Remains inpatient appropriate because:Ongoing diagnostic testing needed not appropriate for outpatient work up, Unsafe d/c plan, IV treatments appropriate due to intensity of illness or inability to take PO, and Inpatient level of care appropriate due to severity of illness  Dispo: The patient is from: Home              Anticipated d/c is to:  unclear              Patient currently is not medically stable to d/c.   Difficult to place patient Yes    Consultants:  ICU   Procedures:   Antimicrobials: rocephin, azithromycin    Subjective: Pt c/o shortness of breath   Objective: Vitals:   02/02/21 0430 02/02/21 0445 02/02/21 0500 02/02/21 0700  BP:   140/72 125/75  Pulse: (!) 59 (!) 59 (!) 50 (!) 56  Resp: 15 12 17 15   Temp:      TempSrc:      SpO2: 100% 100% 100% 100%  Weight:      Height:        Intake/Output Summary (Last 24 hours) at 02/02/2021 0831 Last data filed at 02/02/2021 0641 Gross per 24 hour  Intake 3897.37 ml  Output 575 ml  Net 3322.37 ml   Filed Weights   02/01/21 1202 02/01/21 1607  Weight: 54.9 kg 54.8 kg    Examination:  General exam: Appears calm and comfortable. Appears disheveled  Respiratory system: diminished breath sounds b/l  Cardiovascular system: S1 & S2 +. No  rubs, gallops or clicks. Gastrointestinal system: Abdomen is nondistended, soft and nontender. Normal bowel sounds heard. Central nervous system: Alert and oriented. Moves all extremities Psychiatry: Judgement and insight  appear poor. Flat mood and affect    Data Reviewed: I have personally reviewed following labs and imaging studies  CBC: Recent Labs  Lab 02/01/21 1209 02/02/21 0038  WBC 11.9* 14.3*  NEUTROABS 10.6*  --   HGB 12.8* 11.5*  HCT 39.1 34.7*  MCV 84.1 82.8  PLT 220 580   Basic Metabolic Panel: Recent Labs  Lab 02/01/21 1209 02/02/21 0038  NA 140 136  K 4.1 4.3  CL 104 103  CO2 24 26  GLUCOSE 183* 116*  BUN 49* 44*  CREATININE 3.07* 1.60*  CALCIUM 8.7* 8.3*   GFR: Estimated Creatinine Clearance: 34.3 mL/min (A) (by C-G formula based on SCr of 1.6 mg/dL (H)). Liver Function Tests: Recent Labs  Lab 02/01/21 1209  AST 30  ALT 14  ALKPHOS 63  BILITOT 1.5*  PROT 7.4  ALBUMIN 3.1*   No results for input(s): LIPASE, AMYLASE in the last 168 hours. No results for input(s): AMMONIA in the last 168 hours. Coagulation Profile: Recent Labs  Lab 02/02/21 0038  INR 1.4*   Cardiac Enzymes: No results for input(s): CKTOTAL, CKMB, CKMBINDEX, TROPONINI in the last 168 hours. BNP (last 3 results) No results for input(s): PROBNP in the last 8760 hours. HbA1C: No results for input(s): HGBA1C in the last 72 hours. CBG: Recent Labs  Lab 02/01/21 1603 02/01/21 2157 02/02/21 0726 02/02/21 0827  GLUCAP 211* 133* 62* 49*   Lipid Profile: No results for input(s): CHOL, HDL, LDLCALC, TRIG, CHOLHDL, LDLDIRECT in the last 72 hours. Thyroid Function Tests: No results for input(s): TSH, T4TOTAL, FREET4, T3FREE, THYROIDAB in the last 72 hours. Anemia Panel: No results for input(s): VITAMINB12, FOLATE, FERRITIN, TIBC, IRON, RETICCTPCT in the last 72 hours. Sepsis Labs: Recent Labs  Lab 02/01/21 1248 02/01/21 1418 02/02/21 0244 02/02/21 0511  LATICACIDVEN 3.4* 2.9* 1.4 1.8    Recent Results (from the past 240 hour(s))  Resp Panel by RT-PCR (Flu A&B, Covid) Nasopharyngeal Swab     Status: None   Collection Time: 02/01/21 12:09 PM   Specimen: Nasopharyngeal Swab;  Nasopharyngeal(NP) swabs in vial transport medium  Result Value Ref Range Status   SARS Coronavirus 2 by RT PCR NEGATIVE NEGATIVE Final    Comment: (NOTE) SARS-CoV-2 target nucleic acids are NOT DETECTED.  The SARS-CoV-2 RNA is generally detectable in upper respiratory specimens during the acute phase of infection. The lowest concentration of SARS-CoV-2 viral copies this assay can detect is 138 copies/mL. A negative result does not preclude SARS-Cov-2 infection and should not be used as the sole basis for treatment or other patient management decisions. A negative result may occur with  improper specimen collection/handling, submission of specimen other than nasopharyngeal swab, presence of viral mutation(s) within the areas targeted by this assay, and inadequate number of viral copies(<138 copies/mL). A negative result must be combined with clinical observations, patient history, and epidemiological information. The expected result is Negative.  Fact Sheet for Patients:  EntrepreneurPulse.com.au  Fact Sheet for Healthcare Providers:  IncredibleEmployment.be  This test is no t yet approved or cleared by the [place] FDA and  has been authorized for detection and/or  diagnosis of SARS-CoV-2 by FDA under an Emergency Use Authorization (EUA). This EUA will remain  in effect (meaning this test can be used) for the duration of the COVID-19 declaration under Section 564(b)(1) of the Act, 21 U.S.C.section 360bbb-3(b)(1), unless the authorization is terminated  or revoked sooner.       Influenza A by PCR NEGATIVE NEGATIVE Final   Influenza B by PCR NEGATIVE NEGATIVE Final    Comment: (NOTE) The Xpert Xpress SARS-CoV-2/FLU/RSV plus assay is intended as an aid in the diagnosis of influenza from Nasopharyngeal swab specimens and should not be used as a sole basis for treatment. Nasal washings and aspirates are unacceptable for Xpert Xpress  SARS-CoV-2/FLU/RSV testing.  Fact Sheet for Patients: EntrepreneurPulse.com.au  Fact Sheet for Healthcare Providers: IncredibleEmployment.be  This test is not yet approved or cleared by the [place] FDA and has been authorized for detection and/or diagnosis of SARS-CoV-2 by FDA under an Emergency Use Authorization (EUA). This EUA will remain in effect (meaning this test can be used) for the duration of the COVID-19 declaration under Section 564(b)(1) of the Act, 21 U.S.C. section 360bbb-3(b)(1), unless the authorization is terminated or revoked.  Performed at [facility], [address] [place], [place] [address]   Culture, blood (routine x 2)     Status: None (Preliminary result)   Collection Time: 02/01/21 12:48 PM   Specimen: BLOOD  Result Value Ref Range Status   Specimen Description BLOOD BLOOD RIGHT FOREARM  Final   Special Requests   Final    BOTTLES DRAWN AEROBIC AND ANAEROBIC Blood Culture results may not be optimal due to an inadequate volume of blood received in culture bottles   Culture   Final    NO GROWTH < 24 HOURS Performed at [facility], [address] [place], [place] [address]    Report Status PENDING  Incomplete  Culture, blood (routine x 2)     Status: None (Preliminary result)   Collection Time: 02/01/21 12:48 PM   Specimen: BLOOD  Result Value Ref Range Status   Specimen Description BLOOD RIGHT ANTECUBITAL  Final   Special Requests   Final    BOTTLES DRAWN AEROBIC AND ANAEROBIC Blood Culture adequate volume   Culture   Final    NO GROWTH < 24 HOURS Performed at [facility], [address] [place], [place] [address]    Report Status PENDING  Incomplete  MRSA Next Gen by PCR, Nasal     Status: None   Collection Time: 02/01/21  4:15 PM   Specimen: Nasal Mucosa; Nasal Swab  Result Value Ref Range Status   MRSA by PCR Next Gen NOT DETECTED NOT DETECTED Final    Comment:  (NOTE) The GeneXpert MRSA Assay (FDA approved for NASAL specimens only), is one component of a comprehensive MRSA colonization surveillance program. It is not intended to diagnose MRSA infection nor to guide or monitor treatment for MRSA infections. Test performance is not FDA approved in patients less than 71 years old. Performed at [facility], [address], [place], [place] [address]          Radiology Studies: DG Chest Portable 1 View  Result Date: 02/01/2021 CLINICAL DATA:  Shortness of breath EXAM: PORTABLE CHEST 1 VIEW COMPARISON:  07/17/2020 FINDINGS: Patchy bilateral airspace disease most compatible with pneumonia. Heart is normal size. No effusions or acute bony abnormality. IMPRESSION: Patchy bilateral airspace disease concerning for pneumonia. Electronically Signed   By: Rolm Baptise M.D.  On: 02/01/2021 12:31        Scheduled Meds:  atorvastatin  20 mg Oral Daily   azithromycin  500 mg Oral Daily   Chlorhexidine Gluconate Cloth  6 each Topical Daily   heparin  5,000 Units Subcutaneous Q8H   insulin aspart  0-5 Units Subcutaneous QHS   insulin aspart  0-9 Units Subcutaneous TID WC   Continuous Infusions:  cefTRIAXone (ROCEPHIN)  IV Stopped (02/01/21 2126)   lactated ringers 75 mL/hr at 02/02/21 0225     LOS: 1 day    Time spent: 30 mins     Wyvonnia Dusky, MD [facility] Pager [phone]  If 7PM-7AM, please contact night-coverage 02/02/2021, 8:31 AM

## 2021-02-02 NOTE — Progress Notes (Signed)
SLP follow up Note  Patient Details Name: Louis Stone MRN: [id number] DOB: 1951/04/20   This pt is known to me. I have been attempting to contact pt since last Thursday (01/29/2021) regarding the severity of pt's bedbug and [place] Roach infestation, with no avail. Friday evening I asked [place] Dept to perform a welfare check and pt was sitting on his porch in no apparent distress.   I have been working with Louis Stone to have his house treated for bedbugs and [place] with funding provided by a [employer]. However, on Thursday (01/29/2021) I was contacted by the Exterminator Inspector about the severity of pt's infestation. There was concern that pt's house would need to be emptied of it's contents to erradict the infestation, as well as concern for pt's health and concern for anyone else visiting with pt (there were toys on the den floor).   In an effort to provide more resources to the pt, I spoke with APS on Friday (01/30/2021).   In attempting to contact pt again this morning, I found pt to be admitted. I spoke with pt personally about attempts to reach him as well as referral to APS. He stated that his phone was dead and he was appreciative of me reaching out to APS for more resources.   I spoke with his DSS case manager, Louis Stone [phone]) regarding pt's current hospitalization. I also assisted pt with phone call to Louis Stone.   I conveyed message to Louis Stone (contact listed) for her to call Louis Stone about access to pt's home for further evaluation.     Louis Stone B. Louis Stone M.S., CCC-SLP, [place] Office [phone]    Louis Stone 02/02/2021, 9:49 AM

## 2021-02-02 NOTE — Evaluation (Signed)
Occupational Therapy Evaluation Patient Details Name: Louis Stone MRN: [id number] DOB: 10/16/1950 Today's Date: 02/02/2021    History of Present Illness Louis Stone is a 13yoM who comes to [facility] on 02/01/21 c weakness and SOB x 4 days. PMH: DM2, GERD, HTN, HLD, StgIV SCC oropharynx/tonsils s/p chemoradiation. COVID/FLu (-). Pt admitted with severe sepsis 2/2 multifocal PNA, admitted to SDU requriing suuplemental O2.   Clinical Impression   Pt seen for OT evaluation this date. At baseline, pt reports that he independent in all ADLs and functional mobility (occasionally using RW), living in a 1-story home alone. Pt currently presents with decreased activity tolerance, strength, and balance. Due to these impairments, pt requires MIN GUARD for functional mobility of household distances (without AD), CGA/SUPERVISION for standing grooming tasks, and CGA/SUPERVISION for standing UB bathing. Due to pt's current functional impairments and ongoing discussion regarding pt's ability to live alone and perform IADLs (especially as it relates to managing current bedbug infestation at home), OT to recommend SNF to maximize return to PLOF and minimize risk of future falls, injury, and readmission    Follow Up Recommendations  SNF    Equipment Recommendations  None recommended by OT       Precautions / Restrictions Precautions Precautions: Fall Restrictions Weight Bearing Restrictions: No      Mobility Bed Mobility Overal bed mobility: Modified Independent             General bed mobility comments: With HOB elevated, able to perform with ease    Transfers Overall transfer level: Needs assistance   Transfers: Sit to/from Stand Sit to Stand: Supervision              Balance Overall balance assessment: Needs assistance Sitting-balance support: No upper extremity supported;Feet supported Sitting balance-Leahy Scale: Good Sitting balance - Comments: good sitting balance at EOB  while reaching within BOS   Standing balance support: No upper extremity supported;During functional activity Standing balance-Leahy Scale: Fair Standing balance comment: Fair standing balance while performing standing grooming tasks/UB bathing, however requires intermittent CGA in setting of posterior lean.                           ADL either performed or assessed with clinical judgement   ADL Overall ADL's : Needs assistance/impaired     Grooming: Wash/dry hands;Wash/dry face;Applying deodorant;Min guard;Set up;Standing   Upper Body Bathing: Min guard;Set up;Standing                           Functional mobility during ADLs: Min guard       Vision Baseline Vision/History: Wears glasses Wears Glasses: At all times Patient Visual Report: No change from baseline              Pertinent Vitals/Pain Pain Assessment: No/denies pain     Hand Dominance Right   Extremity/Trunk Assessment Upper Extremity Assessment Upper Extremity Assessment: Generalized weakness   Lower Extremity Assessment Lower Extremity Assessment: Generalized weakness       Communication Communication Communication: HOH;Expressive difficulties   Cognition Arousal/Alertness: Awake/alert Behavior During Therapy: WFL for tasks assessed/performed Overall Cognitive Status: No family/caregiver present to determine baseline cognitive functioning                                 General Comments: Pt alert and oriented to self, place, and parts of situation. Agreeable  to OT tx and motivated to improve activity tolerance before discharging home              Home Living Family/patient expects to be discharged to:: Private residence Living Arrangements: Alone Available Help at Discharge: Family;Available PRN/intermittently (stepdaughter) Type of Home: House Home Access: Ramped entrance     Home Layout: One level     Bathroom Shower/Tub: Animal nutritionist: Standard     Home Equipment: Environmental consultant - 2 wheels;Hand held shower head;Grab bars - tub/shower      Lives With: Alone    Prior Functioning/Environment Level of Independence: Independent with assistive device(s)        Comments: Pt reports that he uses RW "as needed". Reports he independently performs ADLs, IADLs, and drives        OT Problem List: Decreased activity tolerance;Impaired balance (sitting and/or standing)      OT Treatment/Interventions: Self-care/ADL training;Therapeutic exercise;Energy conservation;Therapeutic activities;Patient/family education;Balance training    OT Goals(Current goals can be found in the care plan section) Acute Rehab OT Goals Patient Stated Goal: to go home once safe to OT Goal Formulation: With patient Time For Goal Achievement: 02/16/21 Potential to Achieve Goals: Good ADL Goals Pt Will Perform Grooming: with modified independence;standing Pt Will Perform Lower Body Bathing: with supervision;sit to/from stand Pt Will Transfer to Toilet: with modified independence;ambulating;regular height toilet  OT Frequency: Min 1X/week   Barriers to D/C: Other (comment)  Per chart review and discussion with medical team, pt with current bedbug and german roach infestation. APS home evaluation pending          AM-PAC OT "6 Clicks" Daily Activity     Outcome Measure Help from another person eating meals?: None Help from another person taking care of personal grooming?: A Little Help from another person toileting, which includes using toliet, bedpan, or urinal?: A Little Help from another person bathing (including washing, rinsing, drying)?: A Little Help from another person to put on and taking off regular upper body clothing?: A Little Help from another person to put on and taking off regular lower body clothing?: A Little 6 Click Score: 19   End of Session Nurse Communication: Mobility status  Activity Tolerance: Patient  tolerated treatment well Patient left: in bed;with call bell/phone within reach;with bed alarm set  OT Visit Diagnosis: Unsteadiness on feet (R26.81);Muscle weakness (generalized) (M62.81)                Time: [id number] OT Time Calculation (min): 30 min Charges:  OT General Charges $OT Visit: 1 Visit OT Evaluation $OT Eval Moderate Complexity: 1 Mod OT Treatments $Self Care/Home Management : 23-37 mins  Fredirick Maudlin, OTR/L [employer]

## 2021-02-02 NOTE — Progress Notes (Signed)
Initial Nutrition Assessment  DOCUMENTATION CODES:   Severe malnutrition in context of chronic illness  INTERVENTION:   Ensure Enlive po TID, each supplement provides 350 kcal and 20 grams of protein  Magic cup TID with meals, each supplement provides 290 kcal and 9 grams of protein  MVI po daily   Pt at high refeed risk; recommend monitor potassium, magnesium and phosphorus labs daily until stable  NUTRITION DIAGNOSIS:   Severe Malnutrition related to cancer and cancer related treatments as evidenced by severe muscle depletion, severe fat depletion, percent weight loss.  GOAL:   Patient will meet greater than or equal to 90% of their needs  MONITOR:   PO intake, Supplement acceptance, Labs, Weight trends, I & O's, Skin  REASON FOR ASSESSMENT:   Consult Assessment of nutrition requirement/status  ASSESSMENT:   70 y.o. 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 Pt eating breakfast at time of RD visit. Pt reports good appetite and oral intake pta. Pt reports that he drinks vanilla Ensure at home. Pt drank 2 cartons of milk for breakfast and is eating oatmeal and pancakes. Pt seen by SLP today and downgraded to a dysphagia 1/thin liquid diet. Pt with severe malnutrition. Per chart, pt is down 34lbs(22%) over the past year; this is significant. There are some concerns for patient's welfare at home; pt with bed bugs and RD unsure if patient has access to food regularly. RD will add supplements and MVI to help pt meet his estimated needs. Pt is likely at high refeed risk.   Medications reviewed and include: azithromycin, heparin, insulin, MVI, ceftriaxone, NaCl _0 /hr   Labs reviewed: K 4.3 wnl, BUN 44(H), creat 1.60(H) Wbc- 14.3(H) cbgs- 62, 49, 53, 92, 107 x 24  hrs AIC 6.8(H)- 8/1  NUTRITION - FOCUSED PHYSICAL EXAM:  Flowsheet Row Most Recent Value  Orbital Region Moderate depletion  Upper Arm Region Severe depletion  Thoracic and Lumbar Region Severe depletion  Buccal Region Moderate depletion  Temple Region Severe depletion  Clavicle Bone Region Severe depletion  Clavicle and Acromion Bone Region Severe depletion  Scapular Bone Region Severe depletion  Dorsal Hand Moderate depletion  Patellar Region Severe depletion  Anterior Thigh Region Severe depletion  Posterior Calf Region Severe depletion  Edema (RD Assessment) None  Hair Reviewed  Eyes Reviewed  Mouth Reviewed  Skin Reviewed  Nails Reviewed   Diet Order:   Diet Order             DIET - DYS 1 Room service appropriate? Yes; Fluid consistency: Thin  Diet effective now                  EDUCATION NEEDS:   Education needs have been addressed  Skin:  Skin Assessment: Reviewed RN Assessment  Last BM:  8/1- type 6  Height:   Ht Readings from Last 1 Encounters:  02/01/21 _1  (1.6 m)    Weight:   Wt Readings from Last 1 Encounters:  02/01/21 54.8 kg    Ideal Body Weight:  56.3 kg  BMI:  Body mass index is 21.4 kg/m.  Estimated Nutritional Needs:   Kcal:  1700-2000kcal/day  Protein:  85-100g/day  Fluid:  1.4-1.7L/day  Koleen Distance MS, RD, LDN Please refer to [facility] for RD and/or RD on-call/weekend/after hours pager

## 2021-02-02 NOTE — Evaluation (Signed)
Clinical/Bedside Swallow Evaluation Patient Details  Name: Louis Stone MRN: [id number] Date of Birth: 1950-09-14  Today's Date: 02/02/2021 Time: SLP Start Time (ACUTE ONLY): 0930 SLP Stop Time (ACUTE ONLY): 1000 SLP Time Calculation (min) (ACUTE ONLY): 30 min  Past Medical History:  Past Medical History:  Diagnosis Date   Arthritis    per pt. I do not have md notes that indicate dx.   Diabetes mellitus without complication ([place])    Oropharynx cancer ([place]) 2009   squamous cell cancer  Stage T3, N2c, M0   Personal history of tobacco use, presenting hazards to health 10/08/2015   Past Surgical History:  Past Surgical History:  Procedure Laterality Date   [place]   pt states ruptured disc and had surgery to repair it   HPI:  Louis Stone is a 54yoM who comes to [place] on 02/01/21 c weakness and SOB x 4 days. PMH: DM2, GERD, HTN, HLD, StgIV SCC oropharynx/tonsils s/p chemoradiation. COVID/FLu (-). Pt admitted with severe sepsis 2/2 multifocal PNA, admitted to SDU requriing suuplemental O2. chest x-ray revealed patchy bilateral airspace disease most compatible with pneumonia. Pt currently under APS services for evaluation (started 01/31/2021) of bedbug and [place] roach infestation. DSS case manager Louis Stone [phone]) has been contacted and is aware of pt's hospitalization.   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 As such, pt is not able to effectively masticate soft solids like pancakes as well as medicine whole in applesauce. Pt continued with remaining pill in his mouth after consuming for ~ 5 minutes. Pt primarily drinks vanilla Ensure at home and he is agreeable to consuming a dysphagia 1 diet with thin liquids via straw, medicine crushed in puree while at hospital. Louis Stone has been included in pt's diet order. SLP Visit Diagnosis: Dysphagia,  oral phase (R13.11)    Aspiration Risk  Moderate aspiration risk;Risk for inadequate nutrition/hydration    Diet Recommendation Dysphagia 1 (Puree);Thin liquid   Liquid Administration via: Straw Medication Administration: Crushed with puree Supervision: Patient able to self feed Compensations: Minimize environmental distractions;Slow rate;Small sips/bites Postural Changes: Seated upright at 90 degrees;Remain upright for at least 30 minutes after po intake    Other  Recommendations Oral Care Recommendations: Oral care BID Other Recommendations: Have oral suction available   Follow up Recommendations Skilled Nursing facility      Frequency and Duration min 2x/week  2 weeks       Prognosis Prognosis for Safe Diet Advancement: Fair Barriers to Reach Goals:  (severe Trismus, current home conditions)      Swallow Study   General Date of Onset: 02/01/21 HPI: Louis Stone is a 28yoM who comes to [facility] on 02/01/21 c weakness and SOB x 4 days. PMH: DM2, GERD, HTN, HLD, StgIV SCC oropharynx/tonsils s/p chemoradiation. COVID/FLu (-). Pt admitted with severe sepsis 2/2 multifocal PNA, admitted to SDU requriing suuplemental O2. chest x-ray revealed patchy bilateral airspace disease most compatible with pneumonia. Pt currently under APS services for evaluation (started 01/31/2021) of bedbug and [place] roach infestation. DSS case manager Louis Stone [phone]) has been contacted and is aware of pt's hospitalization. Type of Study: Bedside Swallow Evaluation Previous Swallow Assessment: 07/2020 BSE recommending dysphagia 1 with thin liquids, medicine crushed in puree Diet Prior to this Study: Regular;Thin liquids Temperature Spikes Noted: No Respiratory Status: Room air History of Recent Intubation: No Behavior/Cognition: Alert;Cooperative;Pleasant mood Oral Cavity Assessment: Within Functional  Limits Oral Care Completed by SLP: No Oral Cavity - Dentition: Dentures,  bottom;Dentures, top Vision: Functional for self-feeding Self-Feeding Abilities: Able to feed self Patient Positioning: Upright in bed Baseline Vocal Quality: Normal Volitional Cough: Weak Volitional Swallow: Able to elicit    Oral/Motor/Sensory Function Overall Oral Motor/Sensory Function: Within functional limits   Ice Chips Ice chips: Not tested   Thin Liquid Thin Liquid: Within functional limits Presentation: Self Fed;Straw    Nectar Thick Nectar Thick Liquid: Not tested   Honey Thick Honey Thick Liquid: Not tested   Puree Puree: Within functional limits Presentation: Self Fed;Spoon   Solid     Solid: Impaired Presentation: Self Fed Oral Phase Impairments: Impaired mastication Oral Phase Functional Implications: Impaired mastication;Prolonged oral transit;Oral residue     Louis Stone B. Rutherford Nail M.S., CCC-SLP, [place] Office [place] 02/02/2021,2:57 PM

## 2021-02-02 NOTE — TOC Progression Note (Addendum)
Transition of Care [facility]) - Progression Note    Patient Details  Name: Louis Stone MRN: [id number] Date of Birth: May 05, 1951  [facility]) CM/SW Contact  Kerin Salen, RN Phone Number: 02/02/2021, 1:32 PM  Clinical Narrative:   Speech Language Pathologist, [name] spoke with pt's DSS case manager is Pleas Koch [phone]), I have spoken with her and helped Mr Boose call her as well. While they have only started their evaluation, Mr Overgaard gave DSS permission to have Tiffany open his house for an expedited evaluation (hopefully). I will defer to APS to make anyreferrals to CPS when they complete their evaluation.  TOC will continue to track for discharge needs.          Expected Discharge Plan and Services                                                 Social Determinants of Health (SDOH) Interventions    Readmission Risk Interventions No flowsheet data found.

## 2021-02-02 NOTE — Progress Notes (Signed)
PHARMACY - PHYSICIAN COMMUNICATION CRITICAL VALUE ALERT - BLOOD CULTURE IDENTIFICATION (BCID)  Louis Stone is an 70 y.o. male who presented to [facility] on 02/01/2021 with a chief complaint of increasing weakness and shortness of breath for last 4 days. Started on ceftriaxone and azithromycin for CAP. Hx of squamous cell carcinoma of oropharynx status post chemoradiation  Assessment:  1/4 (aerobic) Staphylococcal species. No obvious potential source for bacteremia. Patient clinically improving on current therapy. 34 L HFNC > RA    Name of physician (or Provider) Contacted: Dr. Tennis Must  Current antibiotics: Ceftriaxone + azithromycin   Changes to prescribed antibiotics recommended:  Suspect possible contamination, recommend continuing current therapy  Results for orders placed or performed during the hospital encounter of 02/01/21  Blood Culture ID Panel (Reflexed) (Collected: 02/01/2021 12:48 PM)  Result Value Ref Range   Enterococcus faecalis NOT DETECTED NOT DETECTED   Enterococcus Faecium NOT DETECTED NOT DETECTED   Listeria monocytogenes NOT DETECTED NOT DETECTED   Staphylococcus species DETECTED (A) NOT DETECTED   Staphylococcus aureus (BCID) NOT DETECTED NOT DETECTED   Staphylococcus epidermidis NOT DETECTED NOT DETECTED   Staphylococcus lugdunensis NOT DETECTED NOT DETECTED   Streptococcus species NOT DETECTED NOT DETECTED   Streptococcus agalactiae NOT DETECTED NOT DETECTED   Streptococcus pneumoniae NOT DETECTED NOT DETECTED   Streptococcus pyogenes NOT DETECTED NOT DETECTED   A.calcoaceticus-baumannii NOT DETECTED NOT DETECTED   Bacteroides fragilis NOT DETECTED NOT DETECTED   Enterobacterales NOT DETECTED NOT DETECTED   Enterobacter cloacae complex NOT DETECTED NOT DETECTED   Escherichia coli NOT DETECTED NOT DETECTED   Klebsiella aerogenes NOT DETECTED NOT DETECTED   Klebsiella oxytoca NOT DETECTED NOT DETECTED   Klebsiella pneumoniae NOT DETECTED NOT  DETECTED   Proteus species NOT DETECTED NOT DETECTED   Salmonella species NOT DETECTED NOT DETECTED   Serratia marcescens NOT DETECTED NOT DETECTED   Haemophilus influenzae NOT DETECTED NOT DETECTED   Neisseria meningitidis NOT DETECTED NOT DETECTED   Pseudomonas aeruginosa NOT DETECTED NOT DETECTED   Stenotrophomonas maltophilia NOT DETECTED NOT DETECTED   Candida albicans NOT DETECTED NOT DETECTED   Candida auris NOT DETECTED NOT DETECTED   Candida glabrata NOT DETECTED NOT DETECTED   Candida krusei NOT DETECTED NOT DETECTED   Candida parapsilosis NOT DETECTED NOT DETECTED   Candida tropicalis NOT DETECTED NOT DETECTED   Cryptococcus neoformans/gattii NOT DETECTED NOT DETECTED   Dorothe Pea, PharmD, BCPS Clinical Pharmacist   02/02/2021  9:30 PM

## 2021-02-02 NOTE — Evaluation (Signed)
Speech Language Pathology Evaluation Patient Details Name: Louis Stone MRN: [id number] DOB: 1950/09/26 Today's Date: 02/02/2021 Time: 1200-1230 SLP Time Calculation (min) (ACUTE ONLY): 30 min  Problem List:  Patient Active Problem List   Diagnosis Date Noted   Protein-calorie malnutrition, severe 02/02/2021   Acute respiratory failure with hypoxia ([place]) 02/01/2021   Squamous cell carcinoma of oropharynx ([place]) 02/01/2021   Acute renal failure (ARF) ([place]) 02/01/2021   Acute left-sided weakness [id number]   Acute metabolic encephalopathy [id number]   Mixed diabetic hyperlipidemia associated with type 2 diabetes mellitus ([place]) 07/16/2020   Uncontrolled type 2 diabetes mellitus with hypoglycemia without coma, with long-term current use of insulin ([place]) 07/16/2020   COVID-19 virus infection 07/16/2020   GERD without esophagitis 07/16/2020   Bradycardia 07/16/2020   Severe sepsis ([place]) 09/16/2016   Personal history of tobacco use, presenting hazards to health 10/08/2015   Past Medical History:  Past Medical History:  Diagnosis Date   Arthritis    per pt. I do not have md notes that indicate dx.   Diabetes mellitus without complication ([place])    Oropharynx cancer ([place]) 2009   squamous cell cancer  Stage T3, N2c, M0   Personal history of tobacco use, presenting hazards to health 10/08/2015   Past Surgical History:  Past Surgical History:  Procedure Laterality Date   [place]   pt states ruptured disc and had surgery to repair it   HPI:  Louis Stone is a 25yoM who comes to [facility] on [facility]21 c weakness and SOB x 4 days. PMH: DM2, GERD, HTN, HLD, StgIV SCC oropharynx/tonsils s/p chemoradiation. COVID/FLu (-). Pt admitted with severe sepsis 2/2 multifocal PNA, admitted to SDU requriing suuplemental O2. chest x-ray revealed patchy bilateral airspace disease most compatible with pneumonia. Pt currently under APS services for evaluation (started 01/31/2021) of bedbug  and [place] roach infestation. DSS case manager Pleas Koch [phone]) has been contacted and is aware of pt's hospitalization.   Assessment / Plan / Recommendation Clinical Impression  Pt presents with severely decreased speech intelligibility d/t significant trismus that is resultant from chemoradiation for oropharynx CA in 2019. Pt also has moderate lingual impairments. As a result of the above, pt's speech is 50-75% intelligibility at the simple phrase level. At this time, skilled ST is recommended to increase pt's speech intelligibility.    SLP Assessment  SLP Recommendation/Assessment: Patient needs continued Speech Lanaguage Pathology Services SLP Visit Diagnosis: Dysarthria and anarthria (R47.1)    Follow Up Recommendations  Skilled Nursing facility    Frequency and Duration min 2x/week  2 weeks      SLP Evaluation Cognition  Overall Cognitive Status: No family/caregiver present to determine baseline cognitive functioning Arousal/Alertness: Awake/alert       Comprehension  Auditory Comprehension Overall Auditory Comprehension: Appears within functional limits for tasks assessed    Expression Expression Primary Mode of Expression: Verbal Verbal Expression Overall Verbal Expression: Appears within functional limits for tasks assessed Written Expression Dominant Hand: Right Written Expression: Not tested   Oral / Motor  Oral Motor/Sensory Function Overall Oral Motor/Sensory Function: Within functional limits Motor Speech Overall Motor Speech: Impaired Respiration: Within functional limits Phonation: Low vocal intensity Resonance: Hypernasality Articulation: Impaired Level of Impairment: Word Intelligibility: Intelligibility reduced Word: 50-74% accurate Phrase: 50-74% accurate Sentence: 25-49% accurate Conversation: 25-49% accurate Motor Planning: Witnin functional limits Motor Speech Errors: Not applicable Interfering Components: Anatomical  limitations Effective Techniques: Slow rate;Increased vocal intensity;Over-articulate   GO  Dantae Meunier B. Rutherford Nail M.S., CCC-SLP, [place] Office [phone]  [name] 02/02/2021, 3:32 PM

## 2021-02-02 NOTE — Progress Notes (Signed)
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  In the emergency department patient was noted to be hypoxic to mid 70s on room air, immediately was put on nonrebreather with O2 sat coming up to 80s, was started on high flow nasal cannula oxygen and nonrebreather, PCCM was consulted for evaluation and help with management  During my evaluation patient was on high flow nasal cannula oxygen and nonrebreather, I remove nonrebreather patient sats remain in high 90s, I turned off high flow nasal cannula oxygen initially patient's O2 sat remained in the 90s then slowly came down to mid 80s, he was started on regular nasal cannula oxygen 6 L with O2 sat in mid 90s.  02/02/21 - no acute events overnight, plan to optimize for TRH transfer. PCCM will sign off and available if issues arise  Pertinent  Medical History   Past Medical History:  Diagnosis Date   Arthritis    per pt. I do not have md notes that indicate dx.   Diabetes mellitus without complication ([place])    Oropharynx cancer ([place]) 2009   squamous cell cancer  Stage T3, N2c, M0   Personal history of tobacco use, presenting hazards to health 10/08/2015      Interim History / Subjective:    Objective   Blood pressure 125/75, pulse (!) 56, temperature 98.3 F (36.8 C), resp. rate 15, height '5\' 3"'$  (1.6 m), weight 54.8 kg, SpO2 100 %.    FiO2 (%):  [100 %] 100 %   Intake/Output Summary (Last 24 hours) at 02/02/2021 0903 Last data filed at 02/02/2021 0641 Gross per 24 hour  Intake 3897.37 ml  Output 575 ml   Net 3322.37 ml    Filed Weights   02/01/21 1202 02/01/21 1607  Weight: 54.9 kg 54.8 kg    Examination:   Physical exam: General: NAD HEENT: [place]/AT, eyes anicteric.  Moist mucous membranes Neuro: Alert, awake, oriented x3, dysarthric speech likely because of chemoradiation of oropharynx Chest: Bilateral faint basal crackles, no wheezes or rhonchi Heart: Regular rate and rhythm, no murmurs or gallops Abdomen: Soft, nontender, nondistended, bowel sounds present Skin: No rash    Resolved Hospital Problem list     Assessment & Plan:  Acute hypoxic respiratory failure with differential due to multifocal pneumonia versus acute PE Acute kidney injury Demand cardiac ischemia Dehydration with lactic acidosis  Continue nasal cannula oxygen, patient is able to maintain O2 sat in mid 90s Continue IV antibiotics transition to PO  less likely massive PE causing his hypoxia multifocal pneumonia is more likely  respiratory culture Trend lactate Continue IV fluid resuscitation, monitor serum creatinine and electrolytes Trend troponin  Best Practice (right click and "Reselect all SmartList Selections" daily)   Diet/type: clear liquids DVT prophylaxis: prophylactic heparin  GI prophylaxis: N/A Lines: N/A Foley:  N/A Code Status:  full code Last date of multidisciplinary goals of care discussion [Per primary team]  Labs   CBC: Recent Labs  Lab 02/01/21 1209 02/02/21 0038  WBC 11.9* 14.3*  NEUTROABS 10.6*  --   HGB 12.8* 11.5*  HCT 39.1 34.7*  MCV 84.1 82.8  PLT 220 195  Basic Metabolic Panel: Recent Labs  Lab 02/01/21 1209 02/02/21 0038  NA 140 136  K 4.1 4.3  CL 104 103  CO2 24 26  GLUCOSE 183* 116*  BUN 49* 44*  CREATININE 3.07* 1.60*  CALCIUM 8.7* 8.3*    GFR: Estimated Creatinine Clearance: 34.3 mL/min (A) (by C-G formula based on SCr of 1.6 mg/dL (H)). Recent Labs  Lab 02/01/21 1209 02/01/21 1248 02/01/21 1418 02/02/21 0038 02/02/21 0244  02/02/21 0511  WBC 11.9*  --   --  14.3*  --   --   LATICACIDVEN  --  3.4* 2.9*  --  1.4 1.8     Liver Function Tests: Recent Labs  Lab 02/01/21 1209  AST 30  ALT 14  ALKPHOS 63  BILITOT 1.5*  PROT 7.4  ALBUMIN 3.1*    No results for input(s): LIPASE, AMYLASE in the last 168 hours. No results for input(s): AMMONIA in the last 168 hours.  ABG    Component Value Date/Time   PHART 7.38 02/01/2021 1209   PCO2ART 40 02/01/2021 1209   PO2ART 59 (L) 02/01/2021 1209   HCO3 23.7 02/01/2021 1209   TCO2 35 (H) 07/17/2020 0038   ACIDBASEDEF 1.3 02/01/2021 1209   O2SAT 88.6 02/01/2021 1225      Coagulation Profile: Recent Labs  Lab 02/02/21 0038  INR 1.4*    Cardiac Enzymes: No results for input(s): CKTOTAL, CKMB, CKMBINDEX, TROPONINI in the last 168 hours.  HbA1C: Hgb A1c MFr Bld  Date/Time Value Ref Range Status  07/20/2020 03:25 AM 6.6 (H) 4.8 - 5.6 % Final    Comment:    (NOTE) Pre diabetes:          5.7%-6.4%  Diabetes:              >6.4%  Glycemic control for   <7.0% adults with diabetes   07/17/2020 05:09 AM 6.3 (H) 4.8 - 5.6 % Final    Comment:    (NOTE) Pre diabetes:          5.7%-6.4%  Diabetes:              >6.4%  Glycemic control for   <7.0% adults with diabetes     CBG: Recent Labs  Lab 02/01/21 1603 02/01/21 2157 02/02/21 0726 02/02/21 0827 02/02/21 0830  GLUCAP 211* 133* 62* 49* 53*    Review of Systems:   12 point review of system is significant for complaint mentioned in HPI, rest is negative  Past Medical History:  He,  has a past medical history of Arthritis, Diabetes mellitus without complication ([place]), Oropharynx cancer ([place]) (2009), and Personal history of tobacco use, presenting hazards to health (10/08/2015).   Surgical History:   Past Surgical History:  Procedure Laterality Date   THORACIC [place]   pt states ruptured disc and had surgery to repair it     Social History:   reports that he quit smoking  about 14 years ago. He has a 60.00 pack-year smoking history. He has never used smokeless tobacco. He reports that he does not drink alcohol and does not use drugs.   Family History:  His family history includes CVA in his father. There is no history of Prostate cancer.   Allergies No Known Allergies   Home Medications  Prior to Admission medications   Medication Sig Start Date End Date Taking? Authorizing Provider  atorvastatin (LIPITOR) 20 MG tablet Take 20 mg by mouth daily. 06/06/20  Yes [provider]  famotidine (PEPCID) 40 MG tablet Take 40 mg by mouth daily.   Yes [provider]  lisinopril (ZESTRIL) 5 MG tablet Take 5 mg by mouth daily. 06/06/20  Yes [provider]  omeprazole (PRILOSEC) 40 MG capsule Take 40 mg by mouth daily. 06/06/20  Yes [provider]  Potassium Chloride ER 20 MEQ TBCR Take 1 tablet by mouth 2 (two) times daily. 10/15/20  Yes [provider]  TOUJEO SOLOSTAR 300 UNIT/ML Solostar Pen Inject 15 Units into the skin daily. 07/21/20  Yes Dahal, Marlowe Aschoff, MD  ACCU-CHEK AVIVA PLUS test strip  09/04/20   [provider]  acetaminophen (TYLENOL) 325 MG tablet Take 650 mg by mouth every 6 (six) hours as needed for headache or mild pain.    [provider]  albuterol (PROVENTIL HFA;VENTOLIN HFA) 108 (90 Base) MCG/ACT inhaler Inhale 2 puffs into the lungs every 6 (six) hours as needed for wheezing.    [provider]  benzonatate (TESSALON) 200 MG capsule Take 200 mg by mouth 3 (three) times daily as needed for cough.    [provider]  guaiFENesin-dextromethorphan (ROBITUSSIN DM) 100-10 MG/5ML syrup Take 10 mLs by mouth every 4 (four) hours as needed for cough. Patient not taking: No sig reported 07/21/20   Terrilee Croak, MD  ondansetron (ZOFRAN) 4 MG tablet Take 1 tablet (4 mg total) by mouth daily as needed for nausea or vomiting. 02/09/20 02/08/21  Duffy Bruce, MD     Critical care provider  statement:   Total critical care time: 33 minutes   Performed by: Lanney Gins MD   Critical care time was exclusive of separately billable procedures and treating other patients.   Critical care was necessary to treat or prevent imminent or life-threatening deterioration.   Critical care was time spent personally by me on the following activities: development of treatment plan with patient and/or surrogate as well as nursing, discussions with consultants, evaluation of patient's response to treatment, examination of patient, obtaining history from patient or surrogate, ordering and performing treatments and interventions, ordering and review of laboratory studies, ordering and review of radiographic studies, pulse oximetry and re-evaluation of patient's condition.    Ottie Glazier, M.D.  Pulmonary & Critical Care Medicine

## 2021-02-03 ENCOUNTER — Encounter: Payer: Self-pay | Admitting: Internal Medicine

## 2021-02-03 DIAGNOSIS — B854 Mixed pediculosis and phthiriasis: Secondary | ICD-10-CM | POA: Diagnosis not present

## 2021-02-03 DIAGNOSIS — J189 Pneumonia, unspecified organism: Secondary | ICD-10-CM

## 2021-02-03 DIAGNOSIS — J9601 Acute respiratory failure with hypoxia: Secondary | ICD-10-CM | POA: Diagnosis not present

## 2021-02-03 LAB — CBC
HCT: 33.8 % — ABNORMAL LOW (ref 39.0–52.0)
Hemoglobin: 11.3 g/dL — ABNORMAL LOW (ref 13.0–17.0)
MCH: 28 pg (ref 26.0–34.0)
MCHC: 33.4 g/dL (ref 30.0–36.0)
MCV: 83.7 fL (ref 80.0–100.0)
Platelets: 184 10*3/uL (ref 150–400)
RBC: 4.04 MIL/uL — ABNORMAL LOW (ref 4.22–5.81)
RDW: 14.6 % (ref 11.5–15.5)
WBC: 8.4 10*3/uL (ref 4.0–10.5)
nRBC: 0 % (ref 0.0–0.2)

## 2021-02-03 LAB — PROCALCITONIN
Procalcitonin: 27.43 ng/mL
Procalcitonin: 10.02 ng/mL

## 2021-02-03 LAB — BASIC METABOLIC PANEL
Anion gap: 7 (ref 5–15)
BUN: 25 mg/dL — ABNORMAL HIGH (ref 8–23)
CO2: 29 mmol/L (ref 22–32)
Calcium: 8.5 mg/dL — ABNORMAL LOW (ref 8.9–10.3)
Chloride: 101 mmol/L (ref 98–111)
Creatinine, Ser: 0.92 mg/dL (ref 0.61–1.24)
GFR, Estimated: >60 mL/min (ref >60)
Glucose, Bld: 70 mg/dL (ref 70–99)
Potassium: 4 mmol/L (ref 3.5–5.1)
Sodium: 137 mmol/L (ref 135–145)

## 2021-02-03 LAB — PHOSPHORUS: Phosphorus: 1.9 mg/dL — ABNORMAL LOW (ref 2.5–4.6)

## 2021-02-03 LAB — GLUCOSE, CAPILLARY
Glucose-Capillary: 89 mg/dL (ref 70–99)
Glucose-Capillary: 52 mg/dL — ABNORMAL LOW (ref 70–99)
Glucose-Capillary: 95 mg/dL (ref 70–99)
Glucose-Capillary: 187 mg/dL — ABNORMAL HIGH (ref 70–99)
Glucose-Capillary: 364 mg/dL — ABNORMAL HIGH (ref 70–99)
Glucose-Capillary: 118 mg/dL — ABNORMAL HIGH (ref 70–99)

## 2021-02-03 LAB — TROPONIN T: Troponin T (Highly Sensitive): 21 ng/L (ref 0–22)

## 2021-02-03 LAB — MAGNESIUM: Magnesium: 2 mg/dL (ref 1.7–2.4)

## 2021-02-03 MED ORDER — POTASSIUM & SODIUM PHOSPHATES 280-160-250 MG PO PACK
1.0000 | PACK | Freq: Three times a day (TID) | ORAL | Status: AC
  Administered 2021-02-03 – 2021-02-04 (×4): 1 via ORAL
  Filled 2021-02-03 (×4): qty 1

## 2021-02-03 NOTE — Progress Notes (Signed)
Physical Therapy Treatment Patient Details Name: Louis Stone MRN: [id number] DOB: 29-Mar-1951 Today's Date: 02/03/2021    History of Present Illness Louis Stone is a 54yoM who [facility] c weakness and SOB x 4 days. PMH: DM2, GERD, HTN, HLD, StgIV SCC oropharynx/tonsils s/p chemoradiation. COVID/FLu (-). Pt admitted with severe sepsis 2/2 multifocal PNA, admitted to SDU requriing suuplemental O2.    PT Comments    Pt received seated upright in bed and agreeable to PT services. Pt mod-I to sit EOB and stands with supervision with no AD. Slight sway noted once standing but no LOB. Session focused on trialing SPC sequencing in room. PT demo and education provided prior. Pt displayed poor to fair ability to display safe sequencing of SPC despite multiple VC's and PT demo. Required max VC's in order to perform with good carryover with still noted lateral sway with walking and requiring CGA. Pt then displayed ambulation with no AD. Pt displayed more stability without the trial of new motor task/pattern but still had x2 bouts small LOB with turning requiring PT CGA to correct. Pt given RW and only requiring supervision with good stability with no sway, safe turning noted. Pt did however require practice with RW sequencing with sitting <> for hand placement. PT demo and mod VC's requiredm for sitting and standing for safe hand placement. Poor carryover with hand placement with sitting despite education and max VC's but safely able to bring LE's to back of stable surface before sitting. Pt educated that RW is best form of AD at this time for safe OOB mobility with pt agreeing. Educated at [employer] pt will be progressed from [place] to [place]. D/c recs remain appropriate at this time.     Follow Up Recommendations  Supervision - Intermittent;SNF     Equipment Recommendations       Recommendations for Other Services       Precautions / Restrictions Precautions Precautions:  Fall Restrictions Weight Bearing Restrictions: No    Mobility  Bed Mobility Overal bed mobility: Modified Independent             General bed mobility comments: With [facility] elevated, able to perform with ease Patient Response: Flat affect  Transfers Overall transfer level: Needs assistance Equipment used: None Transfers: Sit to/from Stand Sit to Stand: Supervision         General transfer comment: Able to stand at EOB with no AD. Slightly unsteady once standing but maintained balance with slight sway.  Ambulation/Gait Ambulation/Gait assistance: Min guard;Supervision Gait Distance (Feet): 100 Feet Assistive device: Rolling walker (2 wheeled);None;Straight cane Gait Pattern/deviations: Step-to pattern;Drifts right/left;Narrow base of support     General Gait Details: Trialed multiple AD's. Pt difficulty sequencing SPC safely requiring CGA <> MinA for safety. CGA for no AD. Supervision with RW.   Stairs             Wheelchair Mobility    Modified Rankin (Stroke Patients Only)       Balance Overall balance assessment: Needs assistance Sitting-balance support: No upper extremity supported;Feet supported Sitting balance-Leahy Scale: Good     Standing balance support: No upper extremity supported;During functional activity;Single extremity supported;Bilateral upper extremity supported Standing balance-Leahy Scale: Fair Standing balance comment: Able to mainain static standing balance with no AD. Slight sway but no LOB.                            Cognition Arousal/Alertness: Awake/alert Behavior During  Therapy: WFL for tasks assessed/performed Overall Cognitive Status: No family/caregiver present to determine baseline cognitive functioning                                 General Comments: Pt alert and agreeable to PT services.      Exercises Other Exercises Other Exercises: Trialing amb within room with RW, no AD, and SPC.  Educated pt that pt most safe right now with RW due to added stability and poor sequencing of SPC increasing risk of falls.    General Comments        Pertinent Vitals/Pain Pain Assessment: Faces Faces Pain Scale: No hurt    Home Living                      Prior Function            PT Goals (current goals can now be found in the care plan section) Acute Rehab PT Goals Patient Stated Goal: to go home once safe to PT Goal Formulation: With patient Time For Goal Achievement: 02/16/21 Potential to Achieve Goals: Good    Frequency    Min 2X/week      PT Plan Current plan remains appropriate    Co-evaluation              AM-PAC PT "6 Clicks" Mobility   Outcome Measure  Help needed turning from your back to your side while in a flat bed without using bedrails?: None Help needed moving from lying on your back to sitting on the side of a flat bed without using bedrails?: None Help needed moving to and from a bed to a chair (including a wheelchair)?: None Help needed standing up from a chair using your arms (e.g., wheelchair or bedside chair)?: None Help needed to walk in hospital room?: A Little Help needed climbing 3-5 steps with a railing? : A Little 6 Click Score: 22    End of Session Equipment Utilized During Treatment: Gait belt Activity Tolerance: Patient tolerated treatment well Patient left: in bed;with bed alarm set;with call bell/phone within reach Nurse Communication: Mobility status PT Visit Diagnosis: Unsteadiness on feet (R26.81);Other abnormalities of gait and mobility (R26.89)     Time: 1341-1400 PT Time Calculation (min) (ACUTE ONLY): 19 min  Charges:  $Gait Training: 8-22 mins                     Salem Caster. Fairly IV, PT, DPT Physical Therapist- [facility]  02/03/2021, 3:14 PM

## 2021-02-03 NOTE — TOC Initial Note (Signed)
Transition of Care [facility]) - Initial/Assessment Note    Patient Details  Name: Louis Stone MRN: [id number] Date of Birth: Feb 17, 1951  [facility] CM/SW Contact:    Shelbie Hutching, RN Phone Number: 02/03/2021, 3:52 PM  Clinical Narrative:                 Patient admitted to the hospital with sepsis.  RNCM was able to speak with patient via phone.  Patient is from home where he lives alone.  Patient reports that he is independent at home and he drives.  He is current with PCP at [facility] center on [address].   SNF for short term rehab has been recommended by therapy but patient reports that he is feeling safer and wants to go home.  He is agreeable to home health services.  Tommi Rumps with [facility] accepted referral for RN, PT, OT, Speech, and SW.   Patient is still on IV antibiotics not medically cleared for discharge yet.    Expected Discharge Plan: [place] Barriers to Discharge: Continued Medical Work up   Patient Goals and CMS Choice Patient states their goals for this hospitalization and ongoing recovery are:: Patient wants to go home does not want to go to SNF CMS Medicare.gov Compare Post Acute Care list provided to:: Patient Choice offered to / list presented to : Patient  Expected Discharge Plan and Services Expected Discharge Plan: [place]   Discharge Planning Services: CM Consult Post Acute Care Choice: [place] arrangements for the past 2 months: Single Family Home                           HH Arranged: RN, PT, OT, Social Work, Speech Therapy          Prior Living Arrangements/Services Living arrangements for the past 2 months: [place] Lives with:: Spouse Patient language and need for interpreter reviewed:: Yes Do you feel safe going back to the place where you live?: Yes      Need for Family Participation in Patient Care: Yes (Comment) (pneumonia and weakness) Care  giver support system in place?: No (comment)   Criminal Activity/Legal Involvement Pertinent to Current Situation/Hospitalization: No - Comment as needed  Activities of Daily Living Home Assistive Devices/Equipment: Environmental consultant (specify type), Eyeglasses ADL Screening (condition at time of admission) Patient's cognitive ability adequate to safely complete daily activities?: Yes Is the patient deaf or have difficulty hearing?: No Does the patient have difficulty seeing, even when wearing glasses/contacts?: No Does the patient have difficulty concentrating, remembering, or making decisions?: No Patient able to express need for assistance with ADLs?: Yes Does the patient have difficulty dressing or bathing?: No Independently performs ADLs?: Yes (appropriate for developmental age) Does the patient have difficulty walking or climbing stairs?: No Weakness of Legs: None (per pt) Weakness of Arms/Hands: None (per pt)  Permission Sought/Granted Permission sought to share information with : Other (comment) Permission granted to share information with : Yes, Release of Information Signed     Permission granted to share info w AGENCY: Home Health Agency        Emotional Assessment   Attitude/Demeanor/Rapport: Engaged Affect (typically observed): Accepting Orientation: : Oriented to Self, Oriented to Place, Oriented to  Time, Oriented to Situation Alcohol / Substance Use: Not Applicable Psych Involvement: No (comment)  Admission diagnosis:  Acute respiratory failure with hypoxia ([place]) [J96.01] Community acquired pneumonia,  unspecified laterality [J18.9] Patient Active Problem List   Diagnosis Date Noted   Protein-calorie malnutrition, severe 02/02/2021   Acute respiratory failure with hypoxia ([place]) 02/01/2021   Squamous cell carcinoma of oropharynx ([place]) 02/01/2021   Acute renal failure (ARF) ([place]) 02/01/2021   Acute left-sided weakness [id number]   Acute metabolic encephalopathy [id number]    Mixed diabetic hyperlipidemia associated with type 2 diabetes mellitus ([place]) 07/16/2020   Uncontrolled type 2 diabetes mellitus with hypoglycemia without coma, with long-term current use of insulin ([place]) 07/16/2020   COVID-19 virus infection 07/16/2020   GERD without esophagitis 07/16/2020   Bradycardia 07/16/2020   Severe sepsis ([place]) 09/16/2016   Personal history of tobacco use, presenting hazards to health 10/08/2015   PCP:  Theotis Burrow, MD Pharmacy:   [place], [place] - [address] [place] [address] Phone: [phone] Fax: [phone]     Social Determinants of Health (SDOH) Interventions    Readmission Risk Interventions Readmission Risk Prevention Plan 02/03/2021  Transportation Screening Complete  PCP or Specialist Appt within 3-5 Days Complete  HRI or [place] Complete  Social Work Consult for [place] Planning/Counseling Complete  Palliative Care Screening Not Applicable  Medication Review Press photographer) Complete  Some recent data might be hidden

## 2021-02-03 NOTE — Progress Notes (Signed)
Inpatient Diabetes Program Recommendations  AACE/ADA: New Consensus Statement on Inpatient Glycemic Control (2015)  Target Ranges:  Prepandial:   less than 140 mg/dL      Peak postprandial:   less than 180 mg/dL (1-2 hours)      Critically ill patients:  140 - 180 mg/dL   Lab Results  Component Value Date   GLUCAP 95 02/03/2021   HGBA1C 6.8 (H) 02/02/2021    Review of Glycemic Control Results for Louis Stone, Louis Stone" (MRN [id number]) as of 02/03/2021 11:12  Ref. Range 02/02/2021 21:50 02/03/2021 05:10 02/03/2021 07:27 02/03/2021 08:00  Glucose-Capillary Latest Ref Range: 70 - 99 mg/dL 189 (H) 89 52 (L) 95    Current orders for Inpatient glycemic control: Novolog 0-9 units TID and 0-5 units QHS  Inpatient Diabetes Program Recommendations:    Has has multiple episodes of hypoglycemia in the morning. Please consider:  1-Novolog 0-6 units TID (very sensitive scale) 2-HS snack  Will continue to follow while inpatient.  Thank you, Reche Dixon, RN, BSN Diabetes Coordinator Inpatient Diabetes Program [phone] (team pager from 8a-5p)

## 2021-02-03 NOTE — Progress Notes (Addendum)
PROGRESS NOTE   HPI was taken from Dr. [place] Louis Stone is a 70 y.o. 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  Patient with nonproductive cough and feeling of soreness to his chest and muscles.  No recent sick contacts, changes in diet, or changes in his home medication regimen.  Patient lives alone.  No other complaints at this time.  Specifically denies headache, no visual changes, no nausea/vomiting/diarrhea, no fever/chills/night sweats, no palpitations, no abdominal pain, no paresthesias.      Louis Stone  AYT:[id number] DOB: 12/01/1950 DOA: 02/01/2021 PCP: Theotis Burrow, MD   Assessment & Plan:   Principal Problem:   Severe sepsis [facility]) Active Problems:   Mixed diabetic hyperlipidemia associated with type 2 diabetes mellitus ([place])   Uncontrolled type 2 diabetes mellitus with hypoglycemia without coma, with long-term current use of insulin (HCC)   GERD without esophagitis   Acute respiratory failure with hypoxia (HCC)   Squamous cell carcinoma of oropharynx (HCC)   Acute renal failure (ARF) (HCC)   Protein-calorie malnutrition, severe   Acute hypoxic respiratory failure: secondary to multifocal pneumonia. Continue on IV rocephin, azithromycin. Continue on bronchodilators. Last procal was 10.02.  Continue on supplemental oxygen and wean as tolerated  Unlikely bacteremia: 1 of 2 blood cx growing stap, likely a containment. Will repeat blood cxs today   Severe sepsis: met criteria w/ tachypnea, elevated lactic acid, leukocytosis, hypotension & pneumonia. Continue on IV abxs. Resolved  Multifocal pneumonia: continue on IV rocephin, azithromycin & bronchodialtors. Strep, legionella ordered. Continue on  supplemental oxygen and wean as tolerated   Bed bugs: w/ infestation of roaches as well at pt's home. APS was called as per speech. See speech's note for more information. S/p permethrin given. Continue on contact precautions  Leukocytosis: resolved   Lactic acidosis: resolved  AKI: resolved   Elevated troponin: likely secondary to demand ischemia  DM2:pretty well controlled w/ HbA1c 6.8. Continue on glargine, SSI w/ accuchecks  HLD: continue on statin   Hypophosphatemia: phosp repleated  GERD: continue on H2 blocker   Generalized weakness: PT/OT recs SNF   Hx of stage IV adenocarcinoma of oropharynx/tonsils: s/p chemo & radiation. Management per onco & ENT   Severe protein calorie malnutrition: continue on nutritional supplements   DVT prophylaxis: heparin  Code Status:  full  Family Communication:  Disposition Plan: unclear  Level of care: Med-Surg  Status is: Inpatient Remains inpatient appropriate because:Ongoing diagnostic testing needed not appropriate for outpatient work up, Unsafe d/c plan, IV treatments appropriate due to intensity of illness or inability to take PO, and Inpatient level of care appropriate due to severity of illness, unsafe d/c plan as pt wants to go home but pt's home has infestation of bed bugs and roaches (please see speech's note). Therapy recs SNF   Dispo: The patient is from: Home              Anticipated d/c is to:  unclear              Patient currently is not medically stable to d/c.   Difficult to place patient Yes    Consultants:  ICU   Procedures:   Antimicrobials: rocephin, azithromycin    Subjective: Pt c/o shortness of breath   Objective: Vitals:   02/02/21 1935  02/03/21 0011 02/03/21 0507 02/03/21 0725  BP: (!) 152/86 118/68 (!) 149/76 129/61  Pulse: 63 70 62 (!) 56  Resp: _0 Temp: 98 F (36.7 C) 98.4 F (36.9 C) 98.5 F (36.9 C) 98.2 F (36.8 C)  TempSrc:    Oral  SpO2: 93% 96% 100% 98%  Weight:       Height:        Intake/Output Summary (Last 24 hours) at 02/03/2021 0734 Last data filed at 02/03/2021 0500 Gross per 24 hour  Intake 1725.39 ml  Output 1100 ml  Net 625.39 ml   Filed Weights   02/01/21 1202 02/01/21 1607  Weight: 54.9 kg 54.8 kg    Examination:  General exam: Appears comfortable. Appears disheveled  Respiratory system: diminished breath sounds b/l  Cardiovascular system: S1 & S2+. No rubs or clicks. Gastrointestinal system: Abd is soft, NT, ND & hypoactive bowel sounds  Central nervous system: Alert and awake. Moves all extremities  Psychiatry: judgement and insight appear poor. Flat mood and affect    Data Reviewed: I have personally reviewed following labs and imaging studies  CBC: Recent Labs  Lab 02/01/21 1209 02/02/21 0038  WBC 11.9* 14.3*  NEUTROABS 10.6*  --   HGB 12.8* 11.5*  HCT 39.1 34.7*  MCV 84.1 82.8  PLT 220 590   Basic Metabolic Panel: Recent Labs  Lab 02/01/21 1209 02/02/21 0038 02/03/21 0553  NA 140 136 137  K 4.1 4.3 4.0  CL 104 103 101  CO2 _1 GLUCOSE 183* 116* 70  BUN 49* 44* 25*  CREATININE 3.07* 1.60* 0.92  CALCIUM 8.7* 8.3* 8.5*  MG  --   --  2.0  PHOS  --   --  1.9*   GFR: Estimated Creatinine Clearance: 59.6 mL/min (by C-G formula based on SCr of 0.92 mg/dL). Liver Function Tests: Recent Labs  Lab 02/01/21 1209  AST 30  ALT 14  ALKPHOS 63  BILITOT 1.5*  PROT 7.4  ALBUMIN 3.1*   No results for input(s): LIPASE, AMYLASE in the last 168 hours. No results for input(s): AMMONIA in the last 168 hours. Coagulation Profile: Recent Labs  Lab 02/02/21 0038  INR 1.4*   Cardiac Enzymes: No results for input(s): CKTOTAL, CKMB, CKMBINDEX, TROPONINI in the last 168 hours. BNP (last 3 results) No results for input(s): PROBNP in the last 8760 hours. HbA1C: Recent Labs    02/02/21 0038  HGBA1C 6.8*   CBG: Recent Labs  Lab 02/02/21 1204 02/02/21 1628 02/02/21 2150 02/03/21 0510 02/03/21 0727   GLUCAP 107* 144* 189* 89 52*   Lipid Profile: No results for input(s): CHOL, HDL, LDLCALC, TRIG, CHOLHDL, LDLDIRECT in the last 72 hours. Thyroid Function Tests: No results for input(s): TSH, T4TOTAL, FREET4, T3FREE, THYROIDAB in the last 72 hours. Anemia Panel: No results for input(s): VITAMINB12, FOLATE, FERRITIN, TIBC, IRON, RETICCTPCT in the last 72 hours. Sepsis Labs: Recent Labs  Lab 02/01/21 1248 02/01/21 1418 02/02/21 0038 02/02/21 0244 02/02/21 0511  PROCALCITON  --   --  27.43  --   --   LATICACIDVEN 3.4* 2.9*  --  1.4 1.8    Recent Results (from the past 240 hour(s))  Resp Panel by RT-PCR (Flu A&B, Covid) Nasopharyngeal Swab     Status: None   Collection Time: 02/01/21 12:09 PM   Specimen: Nasopharyngeal Swab; Nasopharyngeal(NP) swabs in vial transport medium  Result Value Ref Range Status   SARS Coronavirus 2 by RT PCR NEGATIVE NEGATIVE  Final    Comment: (NOTE) SARS-CoV-2 target nucleic acids are NOT DETECTED.  The SARS-CoV-2 RNA is generally detectable in upper respiratory specimens during the acute phase of infection. The lowest concentration of SARS-CoV-2 viral copies this assay can detect is 138 copies/mL. A negative result does not preclude SARS-Cov-2 infection and should not be used as the sole basis for treatment or other patient management decisions. A negative result may occur with  improper specimen collection/handling, submission of specimen other than nasopharyngeal swab, presence of viral mutation(s) within the areas targeted by this assay, and inadequate number of viral copies(<138 copies/mL). A negative result must be combined with clinical observations, patient history, and epidemiological information. The expected result is Negative.  Fact Sheet for Patients:  EntrepreneurPulse.com.au  Fact Sheet for Healthcare Providers:  IncredibleEmployment.be  This test is no t yet approved or cleared by the [place] FDA and  has been authorized for detection and/or diagnosis of SARS-CoV-2 by FDA under an Emergency Use Authorization (EUA). This EUA will remain  in effect (meaning this test can be used) for the duration of the COVID-19 declaration under Section 564(b)(1) of the Act, 21 U.S.C.section 360bbb-3(b)(1), unless the authorization is terminated  or revoked sooner.       Influenza A by PCR NEGATIVE NEGATIVE Final   Influenza B by PCR NEGATIVE NEGATIVE Final    Comment: (NOTE) The Xpert Xpress SARS-CoV-2/FLU/RSV plus assay is intended as an aid in the diagnosis of influenza from Nasopharyngeal swab specimens and should not be used as a sole basis for treatment. Nasal washings and aspirates are unacceptable for Xpert Xpress SARS-CoV-2/FLU/RSV testing.  Fact Sheet for Patients: EntrepreneurPulse.com.au  Fact Sheet for Healthcare Providers: IncredibleEmployment.be  This test is not yet approved or cleared by the [place] FDA and has been authorized for detection and/or diagnosis of SARS-CoV-2 by FDA under an Emergency Use Authorization (EUA). This EUA will remain in effect (meaning this test can be used) for the duration of the COVID-19 declaration under Section 564(b)(1) of the Act, 21 U.S.C. section 360bbb-3(b)(1), unless the authorization is terminated or revoked.  Performed at [facility], [address] [place], [place] [address]   Culture, blood (routine x 2)     Status: None (Preliminary result)   Collection Time: 02/01/21 12:48 PM   Specimen: BLOOD  Result Value Ref Range Status   Specimen Description BLOOD BLOOD RIGHT FOREARM  Final   Special Requests   Final    BOTTLES DRAWN AEROBIC AND ANAEROBIC Blood Culture results may not be optimal due to an inadequate volume of blood received in culture bottles   Culture  Setup Time   Final    Organism ID to follow [place] CRITICAL RESULT  CALLED TO, READ BACK BY AND VERIFIED WITH: CARISSA DOLAN $[email or web address]$  ON 02/02/21 SKL Performed at [facility], [address] [place], [place] [address]    Culture GRAM POSITIVE COCCI  Final   Report Status PENDING  Incomplete  Culture, blood (routine x 2)     Status: None (Preliminary result)   Collection Time: 02/01/21 12:48 PM   Specimen: BLOOD  Result Value Ref Range Status   Specimen Description BLOOD RIGHT ANTECUBITAL  Final   Special Requests   Final    BOTTLES DRAWN AEROBIC AND ANAEROBIC Blood Culture adequate volume   Culture   Final    NO GROWTH < 24 HOURS Performed at [facility], [address], [place], [place] [address]  Report Status PENDING  Incomplete  Blood Culture ID Panel (Reflexed)     Status: Abnormal   Collection Time: 02/01/21 12:48 PM  Result Value Ref Range Status   Enterococcus faecalis NOT DETECTED NOT DETECTED Final   Enterococcus Faecium NOT DETECTED NOT DETECTED Final   Listeria monocytogenes NOT DETECTED NOT DETECTED Final   Staphylococcus species DETECTED (A) NOT DETECTED Final    Comment: CRITICAL RESULT CALLED TO, READ BACK BY AND VERIFIED WITH: CARISSA DOLAN [email or web address]  ON 02/02/21 SKL    Staphylococcus aureus (BCID) NOT DETECTED NOT DETECTED Final   Staphylococcus epidermidis NOT DETECTED NOT DETECTED Final   Staphylococcus lugdunensis NOT DETECTED NOT DETECTED Final   Streptococcus species NOT DETECTED NOT DETECTED Final   Streptococcus agalactiae NOT DETECTED NOT DETECTED Final   Streptococcus pneumoniae NOT DETECTED NOT DETECTED Final   Streptococcus pyogenes NOT DETECTED NOT DETECTED Final   A.calcoaceticus-baumannii NOT DETECTED NOT DETECTED Final   Bacteroides fragilis NOT DETECTED NOT DETECTED Final   Enterobacterales NOT DETECTED NOT DETECTED Final   Enterobacter cloacae complex NOT DETECTED NOT DETECTED Final   Escherichia coli NOT DETECTED NOT DETECTED Final   Klebsiella aerogenes NOT DETECTED NOT DETECTED Final    Klebsiella oxytoca NOT DETECTED NOT DETECTED Final   Klebsiella pneumoniae NOT DETECTED NOT DETECTED Final   Proteus species NOT DETECTED NOT DETECTED Final   Salmonella species NOT DETECTED NOT DETECTED Final   Serratia marcescens NOT DETECTED NOT DETECTED Final   Haemophilus influenzae NOT DETECTED NOT DETECTED Final   Neisseria meningitidis NOT DETECTED NOT DETECTED Final   Pseudomonas aeruginosa NOT DETECTED NOT DETECTED Final   Stenotrophomonas maltophilia NOT DETECTED NOT DETECTED Final   Candida albicans NOT DETECTED NOT DETECTED Final   Candida auris NOT DETECTED NOT DETECTED Final   Candida glabrata NOT DETECTED NOT DETECTED Final   Candida krusei NOT DETECTED NOT DETECTED Final   Candida parapsilosis NOT DETECTED NOT DETECTED Final   Candida tropicalis NOT DETECTED NOT DETECTED Final   Cryptococcus neoformans/gattii NOT DETECTED NOT DETECTED Final    Comment: Performed at [facility], [address] [place], [place] [address]  MRSA Next Gen by PCR, Nasal     Status: None   Collection Time: 02/01/21  4:15 PM   Specimen: Nasal Mucosa; Nasal Swab  Result Value Ref Range Status   MRSA by PCR Next Gen NOT DETECTED NOT DETECTED Final    Comment: (NOTE) The GeneXpert MRSA Assay (FDA approved for NASAL specimens only), is one component of a comprehensive MRSA colonization surveillance program. It is not intended to diagnose MRSA infection nor to guide or monitor treatment for MRSA infections. Test performance is not FDA approved in patients less than 44 years old. Performed at [facility], [address] [place],  [address]          Radiology Studies: DG Chest Portable 1 View  Result Date: 02/01/2021 CLINICAL DATA:  Shortness of breath EXAM: PORTABLE CHEST 1 VIEW COMPARISON:  07/17/2020 FINDINGS: Patchy bilateral airspace disease most compatible with pneumonia. Heart is normal size. No effusions or acute bony abnormality. IMPRESSION: Patchy  bilateral airspace disease concerning for pneumonia. Electronically Signed   By: Rolm Baptise M.D.   On: 02/01/2021 12:31        Scheduled Meds:  atorvastatin  20 mg Oral Daily   azithromycin  500 mg Oral Daily   Chlorhexidine Gluconate Cloth  6 each Topical Daily   famotidine  40 mg Oral Daily   feeding supplement  237 mL Oral TID BM   heparin  5,000 Units Subcutaneous Q8H   insulin aspart  0-5 Units Subcutaneous QHS   insulin aspart  0-9 Units Subcutaneous TID WC   multivitamin with minerals  1 tablet Oral Daily   potassium & sodium phosphates  1 packet Oral TID WC & HS   Continuous Infusions:  sodium chloride 250 mL (02/02/21 1809)   cefTRIAXone (ROCEPHIN)  IV 2 g (02/02/21 1810)     LOS: 2 days    Time spent: 31 mins     Wyvonnia Dusky, MD [facility] Pager [phone]  If 7PM-7AM, please contact night-coverage 02/03/2021, 7:34 AM

## 2021-02-04 ENCOUNTER — Encounter: Payer: Medicare Other | Admitting: Speech Pathology

## 2021-02-04 DIAGNOSIS — R6520 Severe sepsis without septic shock: Secondary | ICD-10-CM | POA: Diagnosis not present

## 2021-02-04 DIAGNOSIS — A419 Sepsis, unspecified organism: Secondary | ICD-10-CM | POA: Diagnosis not present

## 2021-02-04 LAB — CBC
HCT: 34.3 % — ABNORMAL LOW (ref 39.0–52.0)
Hemoglobin: 11.3 g/dL — ABNORMAL LOW (ref 13.0–17.0)
MCH: 27.3 pg (ref 26.0–34.0)
MCHC: 32.9 g/dL (ref 30.0–36.0)
MCV: 82.9 fL (ref 80.0–100.0)
Platelets: 176 10*3/uL (ref 150–400)
RBC: 4.14 MIL/uL — ABNORMAL LOW (ref 4.22–5.81)
RDW: 14.5 % (ref 11.5–15.5)
WBC: 5.7 10*3/uL (ref 4.0–10.5)
nRBC: 0 % (ref 0.0–0.2)

## 2021-02-04 LAB — GLUCOSE, CAPILLARY
Glucose-Capillary: 167 mg/dL — ABNORMAL HIGH (ref 70–99)
Glucose-Capillary: 164 mg/dL — ABNORMAL HIGH (ref 70–99)
Glucose-Capillary: 267 mg/dL — ABNORMAL HIGH (ref 70–99)
Glucose-Capillary: 280 mg/dL — ABNORMAL HIGH (ref 70–99)

## 2021-02-04 LAB — BASIC METABOLIC PANEL
Anion gap: 3 — ABNORMAL LOW (ref 5–15)
BUN: 18 mg/dL (ref 8–23)
CO2: 31 mmol/L (ref 22–32)
Calcium: 8.3 mg/dL — ABNORMAL LOW (ref 8.9–10.3)
Chloride: 100 mmol/L (ref 98–111)
Creatinine, Ser: 0.72 mg/dL (ref 0.61–1.24)
GFR, Estimated: >60 mL/min (ref >60)
Glucose, Bld: 159 mg/dL — ABNORMAL HIGH (ref 70–99)
Potassium: 4.1 mmol/L (ref 3.5–5.1)
Sodium: 134 mmol/L — ABNORMAL LOW (ref 135–145)

## 2021-02-04 LAB — PHOSPHORUS: Phosphorus: 2 mg/dL — ABNORMAL LOW (ref 2.5–4.6)

## 2021-02-04 MED ORDER — POTASSIUM & SODIUM PHOSPHATES 280-160-250 MG PO PACK
2.0000 | PACK | Freq: Three times a day (TID) | ORAL | Status: AC
  Administered 2021-02-04 – 2021-02-05 (×4): 2 via ORAL
  Filled 2021-02-04 (×4): qty 2

## 2021-02-04 MED ORDER — ENOXAPARIN SODIUM 40 MG/0.4ML IJ SOSY
40.0000 mg | PREFILLED_SYRINGE | INTRAMUSCULAR | Status: DC
  Administered 2021-02-04 – 2021-02-06 (×3): 40 mg via SUBCUTANEOUS
  Filled 2021-02-04 (×3): qty 0.4

## 2021-02-04 NOTE — Progress Notes (Signed)
PROGRESS NOTE   HPI was taken from Dr. [place] Louis Stone is a 70 y.o. 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  Patient with nonproductive cough and feeling of soreness to his chest and muscles.  No recent sick contacts, changes in diet, or changes in his home medication regimen.  Patient lives alone.  No other complaints at this time.  Specifically denies headache, no visual changes, no nausea/vomiting/diarrhea, no fever/chills/night sweats, no palpitations, no abdominal pain, no paresthesias.   Louis Stone  ENM:[id number] DOB: 1951/01/08 DOA: 02/01/2021 PCP: Theotis Burrow, MD   Assessment & Plan:   Principal Problem:   Severe sepsis [facility]) Active Problems:   Mixed diabetic hyperlipidemia associated with type 2 diabetes mellitus ([place])   Uncontrolled type 2 diabetes mellitus with hypoglycemia without coma, with long-term current use of insulin (HCC)   GERD without esophagitis   Acute respiratory failure with hypoxia (HCC)   Squamous cell carcinoma of oropharynx (HCC)   Acute renal failure (ARF) (HCC)   Protein-calorie malnutrition, severe   Acute hypoxic respiratory failure: secondary to multifocal pneumonia.  --on presentation, O2 sats 70's%, Placed initially on NRB followed by high flow nasal cannula at 55 L, then down to 8 L nasal cannula.  Was admitted to stepdown.  Weaned down to RA already. --cont ceftriaxone and azithromycin, day 4 of 5  1 of 2 blood cx growing stap, likely a containment.   Severe sepsis: met criteria w/ tachypnea, elevated lactic acid, leukocytosis, hypotension & pneumonia.  --cont abx as above  Bed bugs: w/ infestation of roaches as well at pt's home. APS was called as per  speech. See speech's note for more information. S/p permethrin given. Continue on contact precautions  Leukocytosis: resolved   Lactic acidosis: resolved  AKI: resolved  --encourage oral hydration.  Elevated troponin: likely secondary to demand ischemia  DM2: Hypoglycemic episodes well controlled w/ HbA1c 6.8.  --hold long-acting insulin --SSI sensitive scale  HLD: continue on statin   Hypophosphatemia:  phosp repleated PRN  GERD: continue on H2 blocker   Generalized weakness:  PT/OT recs SNF, pt wants to go home.  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 Management per onco & ENT outpatient.  Severe protein calorie malnutrition: continue on nutritional supplements   DVT prophylaxis: lovenox  Code Status:  full  Family Communication:  Disposition Plan: home   Level of care: Med-Surg  Dispo: The patient is from: Home              Anticipated d/c is to: home tomorrow              Patient currently is not medically stable to d/c.  Need 1 more day of IV abx for PNA.   Consultants:  ICU   Procedures:   Antimicrobials: rocephin, azithromycin    Subjective: Pt reported breathing better.  Complained of mild epigastric pain, but eating ok.  Pt still wants to go home after discharge.   Objective: Vitals:   02/04/21 0359 02/04/21 0713 02/04/21 1136 02/04/21 1621  BP: 121/67 117/63 117/74 112/73  Pulse: 69 67 63 69  Resp: 16 18 20 16   Temp: 98.2 F (36.8 C) 98.2 F (36.8 C) 98.2 F (36.8 C) 98.2 F (36.8 C)  TempSrc: Oral Oral  Oral Oral  SpO2: 93% 96% 97% 95%  Weight:      Height:        Intake/Output Summary (Last 24 hours) at 02/04/2021 1630 Last data filed at 02/04/2021 1036 Gross per 24 hour  Intake 100 ml  Output 1550 ml  Net -1450 ml   Filed Weights   02/01/21 1202 02/01/21 1607  Weight: 54.9 kg 54.8 kg    Examination:  Constitutional: NAD, AAOx3 HEENT: conjunctivae and lids normal, EOMI CV: No cyanosis.    RESP: normal respiratory effort, on RA Extremities: No effusions, edema in BLE SKIN: warm, dry Neuro: II - XII grossly intact.     Data Reviewed: I have personally reviewed following labs and imaging studies  CBC: Recent Labs  Lab 02/01/21 1209 02/02/21 0038 02/03/21 0553 02/04/21 0445  WBC 11.9* 14.3* 8.4 5.7  NEUTROABS 10.6*  --   --   --   HGB 12.8* 11.5* 11.3* 11.3*  HCT 39.1 34.7* 33.8* 34.3*  MCV 84.1 82.8 83.7 82.9  PLT 220 195 184 094   Basic Metabolic Panel: Recent Labs  Lab 02/01/21 1209 02/02/21 0038 02/03/21 0553 02/04/21 0445  NA 140 136 137 134*  K 4.1 4.3 4.0 4.1  CL 104 103 101 100  CO2 24 26 29 31   GLUCOSE 183* 116* 70 159*  BUN 49* 44* 25* 18  CREATININE 3.07* 1.60* 0.92 0.72  CALCIUM 8.7* 8.3* 8.5* 8.3*  MG  --   --  2.0  --   PHOS  --   --  1.9* 2.0*   GFR: Estimated Creatinine Clearance: 68.5 mL/min (by C-G formula based on SCr of 0.72 mg/dL). Liver Function Tests: Recent Labs  Lab 02/01/21 1209  AST 30  ALT 14  ALKPHOS 63  BILITOT 1.5*  PROT 7.4  ALBUMIN 3.1*   No results for input(s): LIPASE, AMYLASE in the last 168 hours. No results for input(s): AMMONIA in the last 168 hours. Coagulation Profile: Recent Labs  Lab 02/02/21 0038  INR 1.4*   Cardiac Enzymes: No results for input(s): CKTOTAL, CKMB, CKMBINDEX, TROPONINI in the last 168 hours. BNP (last 3 results) No results for input(s): PROBNP in the last 8760 hours. HbA1C: Recent Labs    02/02/21 0038  HGBA1C 6.8*   CBG: Recent Labs  Lab 02/03/21 1119 02/03/21 1613 02/03/21 2129 02/04/21 0714 02/04/21 1137  GLUCAP 187* 364* 118* 167* 164*   Lipid Profile: No results for input(s): CHOL, HDL, LDLCALC, TRIG, CHOLHDL, LDLDIRECT in the last 72 hours. Thyroid Function Tests: No results for input(s): TSH, T4TOTAL, FREET4, T3FREE, THYROIDAB in the last 72 hours. Anemia Panel: No results for input(s): VITAMINB12, FOLATE, FERRITIN, TIBC, IRON, RETICCTPCT in the last  72 hours. Sepsis Labs: Recent Labs  Lab 02/01/21 1248 02/01/21 1418 02/02/21 0038 02/02/21 0244 02/02/21 0511 02/03/21 0553  PROCALCITON  --   --  27.43  --   --  10.02  LATICACIDVEN 3.4* 2.9*  --  1.4 1.8  --     Recent Results (from the past 240 hour(s))  Resp Panel by RT-PCR (Flu A&B, Covid) Nasopharyngeal Swab     Status: None   Collection Time: 02/01/21 12:09 PM   Specimen: Nasopharyngeal Swab; Nasopharyngeal(NP) swabs in vial transport medium  Result Value Ref Range Status   SARS Coronavirus 2 by RT PCR NEGATIVE NEGATIVE Final    Comment: (NOTE) SARS-CoV-2 target nucleic acids are NOT DETECTED.  The SARS-CoV-2 RNA is generally detectable in upper respiratory specimens during the acute phase  of infection. The lowest concentration of SARS-CoV-2 viral copies this assay can detect is 138 copies/mL. A negative result does not preclude SARS-Cov-2 infection and should not be used as the sole basis for treatment or other patient management decisions. A negative result may occur with  improper specimen collection/handling, submission of specimen other than nasopharyngeal swab, presence of viral mutation(s) within the areas targeted by this assay, and inadequate number of viral copies(<138 copies/mL). A negative result must be combined with clinical observations, patient history, and epidemiological information. The expected result is Negative.  Fact Sheet for Patients:  EntrepreneurPulse.com.au  Fact Sheet for Healthcare Providers:  IncredibleEmployment.be  This test is no t yet approved or cleared by the [place] FDA and  has been authorized for detection and/or diagnosis of SARS-CoV-2 by FDA under an Emergency Use Authorization (EUA). This EUA will remain  in effect (meaning this test can be used) for the duration of the COVID-19 declaration under Section 564(b)(1) of the Act, 21 U.S.C.section 360bbb-3(b)(1), unless the  authorization is terminated  or revoked sooner.       Influenza A by PCR NEGATIVE NEGATIVE Final   Influenza B by PCR NEGATIVE NEGATIVE Final    Comment: (NOTE) The Xpert Xpress SARS-CoV-2/FLU/RSV plus assay is intended as an aid in the diagnosis of influenza from Nasopharyngeal swab specimens and should not be used as a sole basis for treatment. Nasal washings and aspirates are unacceptable for Xpert Xpress SARS-CoV-2/FLU/RSV testing.  Fact Sheet for Patients: EntrepreneurPulse.com.au  Fact Sheet for Healthcare Providers: IncredibleEmployment.be  This test is not yet approved or cleared by the [place] FDA and has been authorized for detection and/or diagnosis of SARS-CoV-2 by FDA under an Emergency Use Authorization (EUA). This EUA will remain in effect (meaning this test can be used) for the duration of the COVID-19 declaration under Section 564(b)(1) of the Act, 21 U.S.C. section 360bbb-3(b)(1), unless the authorization is terminated or revoked.  Performed at [facility], [address] [place], [place] [address]   Culture, blood (routine x 2)     Status: None (Preliminary result)   Collection Time: 02/01/21 12:48 PM   Specimen: BLOOD  Result Value Ref Range Status   Specimen Description   Final    BLOOD BLOOD RIGHT FOREARM Performed at [facility], [address], [place], [place] [address]    Special Requests   Final    BOTTLES DRAWN AEROBIC AND ANAEROBIC Blood Culture results may not be optimal due to an inadequate volume of blood received in culture bottles Performed at [facility], [address] [place], [place] [address]    Culture  Setup Time   Final    Organism ID to follow [place] TO, READ BACK BY AND VERIFIED WITH: CARISSA DOLAN @2111  ON 02/02/21 SKL Performed at [facility], [address] [place], [place]  [address]    Culture   Final    GRAM POSITIVE COCCI CULTURE REINCUBATED FOR BETTER GROWTH Performed at [facility], [place] [address] [place], [place] [address]    Report Status PENDING  Incomplete  Culture, blood (routine x 2)     Status: None (Preliminary result)   Collection Time: 02/01/21 12:48 PM   Specimen: BLOOD  Result Value Ref Range Status   Specimen Description BLOOD RIGHT ANTECUBITAL  Final   Special Requests   Final    BOTTLES DRAWN AEROBIC AND ANAEROBIC Blood Culture adequate volume  Culture   Final    NO GROWTH 3 DAYS Performed at [facility], [address] [place], [place] [address]    Report Status PENDING  Incomplete  Blood Culture ID Panel (Reflexed)     Status: Abnormal   Collection Time: 02/01/21 12:48 PM  Result Value Ref Range Status   Enterococcus faecalis NOT DETECTED NOT DETECTED Final   Enterococcus Faecium NOT DETECTED NOT DETECTED Final   Listeria monocytogenes NOT DETECTED NOT DETECTED Final   Staphylococcus species DETECTED (A) NOT DETECTED Final    Comment: CRITICAL RESULT CALLED TO, READ BACK BY AND VERIFIED WITH: CARISSA DOLAN @2111  ON 02/02/21 SKL    Staphylococcus aureus (BCID) NOT DETECTED NOT DETECTED Final   Staphylococcus epidermidis NOT DETECTED NOT DETECTED Final   Staphylococcus lugdunensis NOT DETECTED NOT DETECTED Final   Streptococcus species NOT DETECTED NOT DETECTED Final   Streptococcus agalactiae NOT DETECTED NOT DETECTED Final   Streptococcus pneumoniae NOT DETECTED NOT DETECTED Final   Streptococcus pyogenes NOT DETECTED NOT DETECTED Final   A.calcoaceticus-baumannii NOT DETECTED NOT DETECTED Final   Bacteroides fragilis NOT DETECTED NOT DETECTED Final   Enterobacterales NOT DETECTED NOT DETECTED Final   Enterobacter cloacae complex NOT DETECTED NOT DETECTED Final   Escherichia coli NOT DETECTED NOT DETECTED Final   Klebsiella aerogenes NOT DETECTED NOT DETECTED Final   Klebsiella oxytoca NOT DETECTED NOT  DETECTED Final   Klebsiella pneumoniae NOT DETECTED NOT DETECTED Final   Proteus species NOT DETECTED NOT DETECTED Final   Salmonella species NOT DETECTED NOT DETECTED Final   Serratia marcescens NOT DETECTED NOT DETECTED Final   Haemophilus influenzae NOT DETECTED NOT DETECTED Final   Neisseria meningitidis NOT DETECTED NOT DETECTED Final   Pseudomonas aeruginosa NOT DETECTED NOT DETECTED Final   Stenotrophomonas maltophilia NOT DETECTED NOT DETECTED Final   Candida albicans NOT DETECTED NOT DETECTED Final   Candida auris NOT DETECTED NOT DETECTED Final   Candida glabrata NOT DETECTED NOT DETECTED Final   Candida krusei NOT DETECTED NOT DETECTED Final   Candida parapsilosis NOT DETECTED NOT DETECTED Final   Candida tropicalis NOT DETECTED NOT DETECTED Final   Cryptococcus neoformans/gattii NOT DETECTED NOT DETECTED Final    Comment: Performed at [facility], [address] [place], [place] [address]  MRSA Next Gen by PCR, Nasal     Status: None   Collection Time: 02/01/21  4:15 PM   Specimen: Nasal Mucosa; Nasal Swab  Result Value Ref Range Status   MRSA by PCR Next Gen NOT DETECTED NOT DETECTED Final    Comment: (NOTE) The GeneXpert MRSA Assay (FDA approved for NASAL specimens only), is one component of a comprehensive MRSA colonization surveillance program. It is not intended to diagnose MRSA infection nor to guide or monitor treatment for MRSA infections. Test performance is not FDA approved in patients less than 51 years old. Performed at [facility], [address] [place], [place] [address]   CULTURE, BLOOD (ROUTINE X 2) w Reflex to ID Panel     Status: None (Preliminary result)   Collection Time: 02/03/21  2:33 PM   Specimen: BLOOD  Result Value Ref Range Status   Specimen Description BLOOD BLOOD LEFT HAND  Final   Special Requests   Final    BOTTLES DRAWN AEROBIC AND ANAEROBIC Blood Culture adequate volume   Culture   Final    NO GROWTH < 24  HOURS Performed at [facility], [address] [place], [place] [address]  Report Status PENDING  Incomplete  CULTURE, BLOOD (ROUTINE X 2) w Reflex to ID Panel     Status: None (Preliminary result)   Collection Time: 02/03/21  2:33 PM   Specimen: BLOOD  Result Value Ref Range Status   Specimen Description BLOOD BLOOD RIGHT HAND  Final   Special Requests   Final    BOTTLES DRAWN AEROBIC AND ANAEROBIC Blood Culture adequate volume   Culture   Final    NO GROWTH < 24 HOURS Performed at [facility], [address] [place], [place] [address]    Report Status PENDING  Incomplete         Radiology Studies: No results found.      Scheduled Meds:  atorvastatin  20 mg Oral Daily   azithromycin  500 mg Oral Daily   enoxaparin (LOVENOX) injection  40 mg Subcutaneous Q24H   famotidine  40 mg Oral Daily   feeding supplement  237 mL Oral TID BM   insulin aspart  0-5 Units Subcutaneous QHS   insulin aspart  0-9 Units Subcutaneous TID WC   multivitamin with minerals  1 tablet Oral Daily   potassium & sodium phosphates  2 packet Oral TID WC & HS   Continuous Infusions:  sodium chloride 250 mL (02/02/21 1809)   cefTRIAXone (ROCEPHIN)  IV Stopped (02/03/21 2000)     LOS: 3 days    Enzo Bi, MD [facility] Pager [phone]  If 7PM-7AM, please contact night-coverage 02/04/2021, 4:30 PM

## 2021-02-04 NOTE — Care Management Important Message (Signed)
Important Message  Patient Details  Name: Kwaku Dennler MRN: [id number] Date of Birth: Jan 24, 1951   Medicare Important Message Given:  Other (see comment)  Patient is in an isolation room so I called his room to review the Important Message from [facility] [phone] but there was no answer.  Will try again.   Juliann Pulse A Frazer [place] 02/04/2021, 3:00 PM

## 2021-02-04 NOTE — Progress Notes (Signed)
Physical Therapy Treatment Patient Details Name: Louis Stone MRN: [id number] DOB: 1951-05-19 Today's Date: 02/04/2021    History of Present Illness Doak Shrock is a 54yoM who comes to [facility] on 02/01/21 c weakness and SOB x 4 days. PMH: DM2, GERD, HTN, HLD, StgIV SCC oropharynx/tonsils s/p chemoradiation. COVID/FLu (-). Pt admitted with severe sepsis 2/2 multifocal PNA, admitted to SDU requriing suuplemental O2.    PT Comments    Pt received seated in bed agreeable to PT. Prior to OOB mobility pt participated in general LE strength and AROM exercise with good form/technique.  Pt remains mod-I for bed mobility. Good carryover from previous session with STS with SPC with no LOB requiring PT supervision. Improved gait mechanics and sequencing with SPC with good carryover after initial PT demo. No LOB with turns and safe use of SPC with turning. Still slightly unsteady with some lateral sway with SPC use however is progressing well. Pt would benefit from further gait training in next venue of care to progress OOB activities and ambulation to LRAD. Current plan remains appropriate due to current state of home environment. If pt denying STR, would benefit from [facility] PT services to ensure pt safe with at home mobility and progress gait with LRAD to reduce risk of falls and optimize safety with at home ADL completion.    Follow Up Recommendations  Supervision - Intermittent;SNF     Equipment Recommendations  None recommended by PT (deferred to next venue of care.)    Recommendations for Other Services       Precautions / Restrictions Precautions Precautions: Fall Restrictions Weight Bearing Restrictions: No    Mobility  Bed Mobility Overal bed mobility: Modified Independent                  Transfers Overall transfer level: Needs assistance Equipment used: Straight cane Transfers: Sit to/from Stand Sit to Stand: Supervision         General transfer comment: Safe stand  with SPC.  Ambulation/Gait Ambulation/Gait assistance: Supervision Gait Distance (Feet): 100 Feet Assistive device: Straight cane Gait Pattern/deviations: Step-to pattern;Narrow base of support     General Gait Details: Improved sequencing with SPC. After PT demo pt able to have good carryover with gait pattern with SPC with no LOB.   Stairs             Wheelchair Mobility    Modified Rankin (Stroke Patients Only)       Balance Overall balance assessment: Needs assistance Sitting-balance support: No upper extremity supported;Feet supported Sitting balance-Leahy Scale: Good     Standing balance support: Single extremity supported Standing balance-Leahy Scale: Fair Standing balance comment: Good standing balance with SPC. Initially used UE on counter inside room.                            Cognition Arousal/Alertness: Awake/alert Behavior During Therapy: WFL for tasks assessed/performed Overall Cognitive Status: No family/caregiver present to determine baseline cognitive functioning                                 General Comments: Pt alert and agreeable to PT services.      Exercises General Exercises - Lower Extremity Long Arc Quad: AROM;Strengthening;Seated;Both;10 reps Heel Slides: AROM;Strengthening;Both;10 reps;Supine Hip ABduction/ADduction: AROM;Supine;Strengthening;Both;10 reps Hip Flexion/Marching: AROM;Strengthening;Both;10 reps;Supine Other Exercises Other Exercises: Further practice with SPC. Initial PT demo and min VC's for [facility] sequencing  with turns.    General Comments        Pertinent Vitals/Pain Pain Assessment: No/denies pain Pain Score: 0-No pain    Home Living                      Prior Function            PT Goals (current goals can now be found in the care plan section) Acute Rehab PT Goals Patient Stated Goal: to go home once safe to PT Goal Formulation: With patient Time For Goal  Achievement: 02/16/21 Potential to Achieve Goals: Good Progress towards PT goals: Progressing toward goals    Frequency    Min 2X/week      PT Plan Current plan remains appropriate    Co-evaluation              AM-PAC PT "6 Clicks" Mobility   Outcome Measure  Help needed turning from your back to your side while in a flat bed without using bedrails?: None Help needed moving from lying on your back to sitting on the side of a flat bed without using bedrails?: None Help needed moving to and from a bed to a chair (including a wheelchair)?: None Help needed standing up from a chair using your arms (e.g., wheelchair or bedside chair)?: None Help needed to walk in hospital room?: A Little Help needed climbing 3-5 steps with a railing? : A Little 6 Click Score: 22    End of Session Equipment Utilized During Treatment: Gait belt Activity Tolerance: Patient tolerated treatment well Patient left: in bed;with bed alarm set;with call bell/phone within reach;with family/visitor present   PT Visit Diagnosis: Unsteadiness on feet (R26.81);Other abnormalities of gait and mobility (R26.89)     Time: [id number] PT Time Calculation (min) (ACUTE ONLY): 17 min  Charges:  $Gait Training: 8-22 mins                    Salem Caster. Fairly IV, PT, DPT Physical Therapist- [facility]  02/04/2021, 3:01 PM

## 2021-02-05 ENCOUNTER — Encounter: Payer: Self-pay | Admitting: Internal Medicine

## 2021-02-05 DIAGNOSIS — R6520 Severe sepsis without septic shock: Secondary | ICD-10-CM | POA: Diagnosis not present

## 2021-02-05 DIAGNOSIS — A419 Sepsis, unspecified organism: Secondary | ICD-10-CM | POA: Diagnosis not present

## 2021-02-05 LAB — BASIC METABOLIC PANEL
Anion gap: 7 (ref 5–15)
BUN: 19 mg/dL (ref 8–23)
CO2: 30 mmol/L (ref 22–32)
Calcium: 8.6 mg/dL — ABNORMAL LOW (ref 8.9–10.3)
Chloride: 97 mmol/L — ABNORMAL LOW (ref 98–111)
Creatinine, Ser: 0.67 mg/dL (ref 0.61–1.24)
GFR, Estimated: >60 mL/min (ref >60)
Glucose, Bld: 193 mg/dL — ABNORMAL HIGH (ref 70–99)
Potassium: 4.6 mmol/L (ref 3.5–5.1)
Sodium: 134 mmol/L — ABNORMAL LOW (ref 135–145)

## 2021-02-05 LAB — PHOSPHORUS: Phosphorus: 2.7 mg/dL (ref 2.5–4.6)

## 2021-02-05 LAB — CBC
HCT: 34.3 % — ABNORMAL LOW (ref 39.0–52.0)
Hemoglobin: 11.4 g/dL — ABNORMAL LOW (ref 13.0–17.0)
MCH: 27.1 pg (ref 26.0–34.0)
MCHC: 33.2 g/dL (ref 30.0–36.0)
MCV: 81.5 fL (ref 80.0–100.0)
Platelets: 183 10*3/uL (ref 150–400)
RBC: 4.21 MIL/uL — ABNORMAL LOW (ref 4.22–5.81)
RDW: 14.6 % (ref 11.5–15.5)
WBC: 5.6 10*3/uL (ref 4.0–10.5)
nRBC: 0 % (ref 0.0–0.2)

## 2021-02-05 LAB — GLUCOSE, CAPILLARY
Glucose-Capillary: 201 mg/dL — ABNORMAL HIGH (ref 70–99)
Glucose-Capillary: 241 mg/dL — ABNORMAL HIGH (ref 70–99)
Glucose-Capillary: 270 mg/dL — ABNORMAL HIGH (ref 70–99)
Glucose-Capillary: 292 mg/dL — ABNORMAL HIGH (ref 70–99)

## 2021-02-05 LAB — MAGNESIUM: Magnesium: 1.8 mg/dL (ref 1.7–2.4)

## 2021-02-05 MED ORDER — INSULIN GLARGINE-YFGN 100 UNIT/ML ~~LOC~~ SOLN
10.0000 [IU] | Freq: Every day | SUBCUTANEOUS | Status: DC
  Administered 2021-02-05 – 2021-02-06 (×2): 10 [IU] via SUBCUTANEOUS
  Filled 2021-02-05 (×3): qty 0.1

## 2021-02-05 MED ORDER — SODIUM CHLORIDE 0.9 % IV SOLN
2.0000 g | INTRAVENOUS | Status: AC
  Administered 2021-02-05: 2 g via INTRAVENOUS
  Filled 2021-02-05: qty 2

## 2021-02-05 NOTE — Progress Notes (Signed)
PHARMACY - PHYSICIAN COMMUNICATION CRITICAL VALUE ALERT - BLOOD CULTURE IDENTIFICATION (BCID)  Louis Stone is an 70 y.o. male who presented to [facility] on 02/01/2021 with a chief complaint of weakness and shortness of breath.    Assessment:   7/31 Blood culture 1 of 4 with Staphylococcus auricularis  8/2 Blood culture with 1 of 4 GPC, BCID = Staphylococcus species (NOT S aureus or epidermidis).    Name of physician (or Provider) Contacted: Dr Billie Ruddy  Current antibiotics: Ceftriaxone and azithromycin   Changes to prescribed antibiotics recommended:  Patient is on recommended antibiotics - No changes needed. Continue to monitor and await speciation from latest blood culture  Results for orders placed or performed during the hospital encounter of 02/01/21  Blood Culture ID Panel (Reflexed) (Collected: 02/03/2021  2:33 PM)  Result Value Ref Range   Enterococcus faecalis NOT DETECTED NOT DETECTED   Enterococcus Faecium NOT DETECTED NOT DETECTED   Listeria monocytogenes NOT DETECTED NOT DETECTED   Staphylococcus species DETECTED (A) NOT DETECTED   Staphylococcus aureus (BCID) PENDING NOT DETECTED   Staphylococcus epidermidis PENDING NOT DETECTED   Staphylococcus lugdunensis PENDING NOT DETECTED   Streptococcus species NOT DETECTED NOT DETECTED   Streptococcus agalactiae NOT DETECTED NOT DETECTED   Streptococcus pneumoniae NOT DETECTED NOT DETECTED   Streptococcus pyogenes NOT DETECTED NOT DETECTED   A.calcoaceticus-baumannii NOT DETECTED NOT DETECTED   Bacteroides fragilis NOT DETECTED NOT DETECTED   Enterobacterales NOT DETECTED NOT DETECTED   Enterobacter cloacae complex NOT DETECTED NOT DETECTED   Escherichia coli NOT DETECTED NOT DETECTED   Klebsiella aerogenes NOT DETECTED NOT DETECTED   Klebsiella oxytoca NOT DETECTED NOT DETECTED   Klebsiella pneumoniae NOT DETECTED NOT DETECTED   Proteus species NOT DETECTED NOT DETECTED   Salmonella species NOT DETECTED NOT DETECTED    Serratia marcescens NOT DETECTED NOT DETECTED   Haemophilus influenzae NOT DETECTED NOT DETECTED   Neisseria meningitidis NOT DETECTED NOT DETECTED   Pseudomonas aeruginosa NOT DETECTED NOT DETECTED   Stenotrophomonas maltophilia NOT DETECTED NOT DETECTED   Candida albicans NOT DETECTED NOT DETECTED   Candida auris NOT DETECTED NOT DETECTED   Candida glabrata NOT DETECTED NOT DETECTED   Candida krusei NOT DETECTED NOT DETECTED   Candida parapsilosis NOT DETECTED NOT DETECTED   Candida tropicalis NOT DETECTED NOT DETECTED   Cryptococcus neoformans/gattii NOT DETECTED NOT DETECTED    Doreene Eland, PharmD, BCPS.   Work Cell: [phone] 02/05/2021 9:19 AM

## 2021-02-05 NOTE — TOC Transition Note (Signed)
Transition of Care [facility]) - CM/SW Discharge Note   Patient Details  Name: Louis Stone MRN: [id number] Date of Birth: [birth year]  Transition of Care [facility]) CM/SW Contact:  Shelbie Hutching, RN Phone Number: 02/05/2021, 9:16 AM   Clinical Narrative:    Patient is medically cleared for discharge home with home health services.  Tommi Rumps with [facility] accepted home health referral for RN, PT, OT, Speech and SW.  Patient has a RW at home, he reports that he does not have transportation home.  [facility] will arrange [facility] once patient's discharge has been completed.     Final next level of care: [place] Barriers to Discharge: Barriers Resolved   Patient Goals and CMS Choice Patient states their goals for this hospitalization and ongoing recovery are:: Patient wants to go home does not want to go to SNF CMS Medicare.gov Compare Post Acute Care list provided to:: Patient Choice offered to / list presented to : Patient  Discharge Placement                       Discharge Plan and Services   Discharge Planning Services: CM Consult Post Acute Care Choice: Home Health          DME Arranged: N/A DME Agency: NA       HH Arranged: RN, PT, OT, Social Work, Theme park manager Therapy HH Agency: [place] Date [facility] Agency Contacted: 02/05/21 Time [place]: 0915 Representative spoke with at [place]: [place] ([place]) Interventions     Readmission Risk Interventions Readmission Risk Prevention Plan 02/03/2021  Transportation Screening Complete  PCP or Specialist Appt within 3-5 Days Complete  HRI or [place] Complete  Social Work Consult for [place] Planning/Counseling Complete  Palliative Care Screening Not Applicable  Medication Review Press photographer) Complete  Some recent data might be hidden

## 2021-02-05 NOTE — TOC Progression Note (Signed)
Transition of Care [facility]) - Progression Note    Patient Details  Name: Louis Stone MRN: [id number] Date of Birth: 11/21/1950  Transition of Care [facility]) CM/SW Contact  Shelbie Hutching, RN Phone Number: 02/05/2021, 11:52 AM  Clinical Narrative:    Discharged canceled to to positive blood culture.    Expected Discharge Plan: [place] Barriers to Discharge: Barriers Resolved  Expected Discharge Plan and Services Expected Discharge Plan: [place]   Discharge Planning Services: CM Consult Post Acute Care Choice: [place] arrangements for the past 2 months: Single Family Home Expected Discharge Date: 02/05/21               DME Arranged: N/A DME Agency: NA       HH Arranged: RN, PT, OT, Social Work, Theme park manager Therapy HH Agency: [place] Date [facility] Agency Contacted: 02/05/21 Time [place]: 0915 Representative spoke with at [place]: [place] ([place]) Interventions    Readmission Risk Interventions Readmission Risk Prevention Plan 02/03/2021  Transportation Screening Complete  PCP or Specialist Appt within 3-5 Days Complete  HRI or [place] Complete  Social Work Consult for [place] Planning/Counseling [place] Not Applicable  Medication Review Press photographer) Complete  Some recent data might be hidden

## 2021-02-05 NOTE — Care Management Important Message (Signed)
Important Message  Patient Details  Name: Louis Stone MRN: [id number] Date of Birth: 23-Feb-1951   Medicare Important Message Given:  Yes  Patient is in an isolation room so I reviewed the Important Message from Medicare by phone with him [phone]). He is in agreement with discharge home. I asked if he would like a copy and he replied no.  I wished him well and thanked him for his time.  Juliann Pulse A Isadore Palecek 02/05/2021, 11:10 AM

## 2021-02-05 NOTE — Progress Notes (Signed)
PROGRESS NOTE   HPI was taken from Dr. [place] Louis Stone is a 70 y.o. 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  Patient with nonproductive cough and feeling of soreness to his chest and muscles.  No recent sick contacts, changes in diet, or changes in his home medication regimen.  Patient lives alone.  No other complaints at this time.  Specifically denies headache, no visual changes, no nausea/vomiting/diarrhea, no fever/chills/night sweats, no palpitations, no abdominal pain, no paresthesias.   Louis Stone  [id number] DOB: Feb 08, 1951 DOA: 02/01/2021 PCP: Theotis Burrow, MD   Assessment & Plan:   Principal Problem:   Severe sepsis [facility]) Active Problems:   Mixed diabetic hyperlipidemia associated with type 2 diabetes mellitus ([place])   Uncontrolled type 2 diabetes mellitus with hypoglycemia without coma, with long-term current use of insulin (HCC)   GERD without esophagitis   Acute respiratory failure with hypoxia (HCC)   Squamous cell carcinoma of oropharynx (HCC)   Acute renal failure (ARF) (HCC)   Protein-calorie malnutrition, severe   Acute hypoxic respiratory failure: secondary to multifocal pneumonia.  --on presentation, O2 sats 70's%, Placed initially on NRB followed by high flow nasal cannula at 55 L, then down to 8 L nasal cannula.  Was admitted to stepdown.  Weaned down to RA already. --cont ceftriaxone and azithromycin, day 5 of 5  Repeat blood cx 1 of 4 pos for GPC --Need blood cx speciation to rule in contaminant.    Severe sepsis met criteria w/ tachypnea, elevated lactic acid, leukocytosis, hypotension & pneumonia.  --cont abx as above  Bed bugs:  w/ infestation of roaches as well at  pt's home. APS was called as per speech. See speech's note for more information. S/p permethrin given. Continue on contact precautions  Leukocytosis: resolved   Lactic acidosis: resolved  AKI: resolved  --encourage oral hydration.  Elevated troponin: likely secondary to demand ischemia  DM2: Hypoglycemic episodes well controlled w/ HbA1c 6.8.  --resume long-acting insulin at reduced dose of 10u daily --SSI sensitive scale  HLD: continue on statin   Hypophosphatemia:  phosp repleated PRN  GERD: continue on H2 blocker   Generalized weakness:  PT/OT recs SNF, pt wants to go home.  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 Management per onco & ENT outpatient.  Severe protein calorie malnutrition:  continue on nutritional supplements    DVT prophylaxis: lovenox  Code Status:  full  Family Communication:  Disposition Plan: home   Level of care: Med-Surg  Dispo: The patient is from: Home              Anticipated d/c is to: 1-2 days              Patient currently is not medically stable to d/c.  Need blood cx speciation to rule in contaminant.     Consultants:  ICU   Procedures:   Antimicrobials: rocephin, azithromycin    Subjective: No new complaints.  Eating ok.  Repeat blood cx again 1/4 GPC.  Need to wait for speciation.     Objective: Vitals:   02/05/21 0503 02/05/21 0713 02/05/21 1125 02/05/21 1556  BP: 122/70 126/74 (!) 137/95 110/75  Pulse: 66 72 88 79  Resp: 18 20 18 20   Temp: 97.7 F (  36.5 C) 98.5 F (36.9 C) 97.7 F (36.5 C) 98.2 F (36.8 C)  TempSrc: Oral     SpO2: 95% 94% 98% (!) 89%  Weight:      Height:        Intake/Output Summary (Last 24 hours) at 02/05/2021 1730 Last data filed at 02/05/2021 1600 Gross per 24 hour  Intake 582.68 ml  Output 2125 ml  Net -1542.32 ml   Filed Weights   02/01/21 1202 02/01/21 1607  Weight: 54.9 kg 54.8 kg    Examination:  Constitutional: NAD, AAOx3 HEENT:  conjunctivae and lids normal, EOMI CV: No cyanosis.   RESP: normal respiratory effort, on RA Extremities: No effusions, edema in BLE SKIN: warm, dry Neuro: II - XII grossly intact.     Data Reviewed: I have personally reviewed following labs and imaging studies  CBC: Recent Labs  Lab 02/01/21 1209 02/02/21 0038 02/03/21 0553 02/04/21 0445 02/05/21 0450  WBC 11.9* 14.3* 8.4 5.7 5.6  NEUTROABS 10.6*  --   --   --   --   HGB 12.8* 11.5* 11.3* 11.3* 11.4*  HCT 39.1 34.7* 33.8* 34.3* 34.3*  MCV 84.1 82.8 83.7 82.9 81.5  PLT 220 195 184 176 408   Basic Metabolic Panel: Recent Labs  Lab 02/01/21 1209 02/02/21 0038 02/03/21 0553 02/04/21 0445 02/05/21 0450  NA 140 136 137 134* 134*  K 4.1 4.3 4.0 4.1 4.6  CL 104 103 101 100 97*  CO2 24 26 29 31 30   GLUCOSE 183* 116* 70 159* 193*  BUN 49* 44* 25* 18 19  CREATININE 3.07* 1.60* 0.92 0.72 0.67  CALCIUM 8.7* 8.3* 8.5* 8.3* 8.6*  MG  --   --  2.0  --  1.8  PHOS  --   --  1.9* 2.0* 2.7   GFR: Estimated Creatinine Clearance: 68.5 mL/min (by C-G formula based on SCr of 0.67 mg/dL). Liver Function Tests: Recent Labs  Lab 02/01/21 1209  AST 30  ALT 14  ALKPHOS 63  BILITOT 1.5*  PROT 7.4  ALBUMIN 3.1*   No results for input(s): LIPASE, AMYLASE in the last 168 hours. No results for input(s): AMMONIA in the last 168 hours. Coagulation Profile: Recent Labs  Lab 02/02/21 0038  INR 1.4*   Cardiac Enzymes: No results for input(s): CKTOTAL, CKMB, CKMBINDEX, TROPONINI in the last 168 hours. BNP (last 3 results) No results for input(s): PROBNP in the last 8760 hours. HbA1C: No results for input(s): HGBA1C in the last 72 hours.  CBG: Recent Labs  Lab 02/04/21 1718 02/04/21 2120 02/05/21 0715 02/05/21 1127 02/05/21 1558  GLUCAP 267* 280* 201* 241* 270*   Lipid Profile: No results for input(s): CHOL, HDL, LDLCALC, TRIG, CHOLHDL, LDLDIRECT in the last 72 hours. Thyroid Function Tests: No results for input(s): TSH,  T4TOTAL, FREET4, T3FREE, THYROIDAB in the last 72 hours. Anemia Panel: No results for input(s): VITAMINB12, FOLATE, FERRITIN, TIBC, IRON, RETICCTPCT in the last 72 hours. Sepsis Labs: Recent Labs  Lab 02/01/21 1248 02/01/21 1418 02/02/21 0038 02/02/21 0244 02/02/21 0511 02/03/21 0553  PROCALCITON  --   --  27.43  --   --  10.02  LATICACIDVEN 3.4* 2.9*  --  1.4 1.8  --     Recent Results (from the past 240 hour(s))  Resp Panel by RT-PCR (Flu A&B, Covid) Nasopharyngeal Swab     Status: None   Collection Time: 02/01/21 12:09 PM   Specimen: Nasopharyngeal Swab; Nasopharyngeal(NP) swabs in vial transport medium  Result Value Ref  Range Status   SARS Coronavirus 2 by RT PCR NEGATIVE NEGATIVE Final    Comment: (NOTE) SARS-CoV-2 target nucleic acids are NOT DETECTED.  The SARS-CoV-2 RNA is generally detectable in upper respiratory specimens during the acute phase of infection. The lowest concentration of SARS-CoV-2 viral copies this assay can detect is 138 copies/mL. A negative result does not preclude SARS-Cov-2 infection and should not be used as the sole basis for treatment or other patient management decisions. A negative result may occur with  improper specimen collection/handling, submission of specimen other than nasopharyngeal swab, presence of viral mutation(s) within the areas targeted by this assay, and inadequate number of viral copies(<138 copies/mL). A negative result must be combined with clinical observations, patient history, and epidemiological information. The expected result is Negative.  Fact Sheet for Patients:  EntrepreneurPulse.com.au  Fact Sheet for Healthcare Providers:  IncredibleEmployment.be  This test is no t yet approved or cleared by the [place] FDA and  has been authorized for detection and/or diagnosis of SARS-CoV-2 by FDA under an Emergency Use Authorization (EUA). This EUA will remain  in effect  (meaning this test can be used) for the duration of the COVID-19 declaration under Section 564(b)(1) of the Act, 21 U.S.C.section 360bbb-3(b)(1), unless the authorization is terminated  or revoked sooner.       Influenza A by PCR NEGATIVE NEGATIVE Final   Influenza B by PCR NEGATIVE NEGATIVE Final    Comment: (NOTE) The Xpert Xpress SARS-CoV-2/FLU/RSV plus assay is intended as an aid in the diagnosis of influenza from Nasopharyngeal swab specimens and should not be used as a sole basis for treatment. Nasal washings and aspirates are unacceptable for Xpert Xpress SARS-CoV-2/FLU/RSV testing.  Fact Sheet for Patients: EntrepreneurPulse.com.au  Fact Sheet for Healthcare Providers: IncredibleEmployment.be  This test is not yet approved or cleared by the [place] FDA and has been authorized for detection and/or diagnosis of SARS-CoV-2 by FDA under an Emergency Use Authorization (EUA). This EUA will remain in effect (meaning this test can be used) for the duration of the COVID-19 declaration under Section 564(b)(1) of the Act, 21 U.S.C. section 360bbb-3(b)(1), unless the authorization is terminated or revoked.  Performed at [facility], [address] [place], [place] [address]   Culture, blood (routine x 2)     Status: Abnormal (Preliminary result)   Collection Time: 02/01/21 12:48 PM   Specimen: BLOOD  Result Value Ref Range Status   Specimen Description   Final    BLOOD BLOOD RIGHT FOREARM Performed at [facility], [address], [place], [place] [address]    Special Requests   Final    BOTTLES DRAWN AEROBIC AND ANAEROBIC Blood Culture results may not be optimal due to an inadequate volume of blood received in culture bottles Performed at [facility], [address] [place], [place] [address]    Culture  Setup Time   Final    Organism ID to follow [place] TO, READ BACK BY AND VERIFIED WITH: CARISSA DOLAN @2111  ON 02/02/21 SKL Performed at [facility], [address] [place], [place] [address]    Culture (A)  Final    STAPHYLOCOCCUS AURICULARIS CULTURE REINCUBATED FOR BETTER GROWTH Performed at [facility], [place] [address] [place],  [address]    Report Status PENDING  Incomplete  Culture, blood (routine x 2)     Status: None (Preliminary result)   Collection Time: 02/01/21 12:48 PM  Specimen: BLOOD  Result Value Ref Range Status   Specimen Description BLOOD RIGHT ANTECUBITAL  Final   Special Requests   Final    BOTTLES DRAWN AEROBIC AND ANAEROBIC Blood Culture adequate volume   Culture   Final    NO GROWTH 4 DAYS Performed at [facility], [address] [place], [place] [address]    Report Status PENDING  Incomplete  Blood Culture ID Panel (Reflexed)     Status: Abnormal   Collection Time: 02/01/21 12:48 PM  Result Value Ref Range Status   Enterococcus faecalis NOT DETECTED NOT DETECTED Final   Enterococcus Faecium NOT DETECTED NOT DETECTED Final   Listeria monocytogenes NOT DETECTED NOT DETECTED Final   Staphylococcus species DETECTED (A) NOT DETECTED Final    Comment: CRITICAL RESULT CALLED TO, READ BACK BY AND VERIFIED WITH: CARISSA DOLAN @2111  ON 02/02/21 SKL    Staphylococcus aureus (BCID) NOT DETECTED NOT DETECTED Final   Staphylococcus epidermidis NOT DETECTED NOT DETECTED Final   Staphylococcus lugdunensis NOT DETECTED NOT DETECTED Final   Streptococcus species NOT DETECTED NOT DETECTED Final   Streptococcus agalactiae NOT DETECTED NOT DETECTED Final   Streptococcus pneumoniae NOT DETECTED NOT DETECTED Final   Streptococcus pyogenes NOT DETECTED NOT DETECTED Final   A.calcoaceticus-baumannii NOT DETECTED NOT DETECTED Final   Bacteroides fragilis NOT DETECTED NOT DETECTED Final   Enterobacterales NOT DETECTED NOT DETECTED Final   Enterobacter cloacae complex NOT DETECTED  NOT DETECTED Final   Escherichia coli NOT DETECTED NOT DETECTED Final   Klebsiella aerogenes NOT DETECTED NOT DETECTED Final   Klebsiella oxytoca NOT DETECTED NOT DETECTED Final   Klebsiella pneumoniae NOT DETECTED NOT DETECTED Final   Proteus species NOT DETECTED NOT DETECTED Final   Salmonella species NOT DETECTED NOT DETECTED Final   Serratia marcescens NOT DETECTED NOT DETECTED Final   Haemophilus influenzae NOT DETECTED NOT DETECTED Final   Neisseria meningitidis NOT DETECTED NOT DETECTED Final   Pseudomonas aeruginosa NOT DETECTED NOT DETECTED Final   Stenotrophomonas maltophilia NOT DETECTED NOT DETECTED Final   Candida albicans NOT DETECTED NOT DETECTED Final   Candida auris NOT DETECTED NOT DETECTED Final   Candida glabrata NOT DETECTED NOT DETECTED Final   Candida krusei NOT DETECTED NOT DETECTED Final   Candida parapsilosis NOT DETECTED NOT DETECTED Final   Candida tropicalis NOT DETECTED NOT DETECTED Final   Cryptococcus neoformans/gattii NOT DETECTED NOT DETECTED Final    Comment: Performed at [facility], [address] [place], [place] [address]  MRSA Next Gen by PCR, Nasal     Status: None   Collection Time: 02/01/21  4:15 PM   Specimen: Nasal Mucosa; Nasal Swab  Result Value Ref Range Status   MRSA by PCR Next Gen NOT DETECTED NOT DETECTED Final    Comment: (NOTE) The GeneXpert MRSA Assay (FDA approved for NASAL specimens only), is one component of a comprehensive MRSA colonization surveillance program. It is not intended to diagnose MRSA infection nor to guide or monitor treatment for MRSA infections. Test performance is not FDA approved in patients less than 28 years old. Performed at [facility], [address] [place],  [address]   CULTURE, BLOOD (ROUTINE X 2) w Reflex to ID Panel     Status: None (Preliminary result)   Collection Time: 02/03/21  2:33 PM   Specimen: BLOOD  Result Value Ref Range Status   Specimen Description    Final    BLOOD BLOOD LEFT HAND Performed at [facility],  [place], [place] [address]    Special Requests   Final    BOTTLES DRAWN AEROBIC AND ANAEROBIC Blood Culture adequate volume Performed at [facility], [address] [place], [place] [address]    Culture  Setup Time   Final    GRAM POSITIVE COCCI AEROBIC BOTTLE ONLY Gram Stain Report Called to,Read Back By and Verified With: MORGAN HICKS 02/05/21 @ 0900 BY S BEARD Performed at [facility], [place] [address], [place], [place] [address]    Culture GRAM POSITIVE COCCI  Final   Report Status PENDING  Incomplete  CULTURE, BLOOD (ROUTINE X 2) w Reflex to ID Panel     Status: None (Preliminary result)   Collection Time: 02/03/21  2:33 PM   Specimen: BLOOD  Result Value Ref Range Status   Specimen Description BLOOD BLOOD RIGHT HAND  Final   Special Requests   Final    BOTTLES DRAWN AEROBIC AND ANAEROBIC Blood Culture adequate volume   Culture   Final    NO GROWTH 2 DAYS Performed at [facility], [address], [place], [place] [address]    Report Status PENDING  Incomplete  Blood Culture ID Panel (Reflexed)     Status: Abnormal (Preliminary result)   Collection Time: 02/03/21  2:33 PM  Result Value Ref Range Status   Enterococcus faecalis NOT DETECTED NOT DETECTED Final   Enterococcus Faecium NOT DETECTED NOT DETECTED Final   Listeria monocytogenes NOT DETECTED NOT DETECTED Final   Staphylococcus species DETECTED (A) NOT DETECTED Final    Comment: MORGAN HICKS 02/05/21 @ 0900 BY S BEARD   Staphylococcus aureus (BCID) PENDING NOT DETECTED Incomplete   Staphylococcus epidermidis PENDING NOT DETECTED Incomplete   Staphylococcus lugdunensis PENDING NOT DETECTED Incomplete   Streptococcus species NOT DETECTED NOT DETECTED Final   Streptococcus agalactiae NOT DETECTED NOT DETECTED Final   Streptococcus pneumoniae NOT DETECTED NOT DETECTED Final   Streptococcus pyogenes NOT DETECTED  NOT DETECTED Final   A.calcoaceticus-baumannii NOT DETECTED NOT DETECTED Final   Bacteroides fragilis NOT DETECTED NOT DETECTED Final   Enterobacterales NOT DETECTED NOT DETECTED Final   Enterobacter cloacae complex NOT DETECTED NOT DETECTED Final   Escherichia coli NOT DETECTED NOT DETECTED Final   Klebsiella aerogenes NOT DETECTED NOT DETECTED Final   Klebsiella oxytoca NOT DETECTED NOT DETECTED Final   Klebsiella pneumoniae NOT DETECTED NOT DETECTED Final   Proteus species NOT DETECTED NOT DETECTED Final   Salmonella species NOT DETECTED NOT DETECTED Final   Serratia marcescens NOT DETECTED NOT DETECTED Final   Haemophilus influenzae NOT DETECTED NOT DETECTED Final   Neisseria meningitidis NOT DETECTED NOT DETECTED Final   Pseudomonas aeruginosa NOT DETECTED NOT DETECTED Final   Stenotrophomonas maltophilia NOT DETECTED NOT DETECTED Final   Candida albicans NOT DETECTED NOT DETECTED Final   Candida auris NOT DETECTED NOT DETECTED Final   Candida glabrata NOT DETECTED NOT DETECTED Final   Candida krusei NOT DETECTED NOT DETECTED Final   Candida parapsilosis NOT DETECTED NOT DETECTED Final   Candida tropicalis NOT DETECTED NOT DETECTED Final   Cryptococcus neoformans/gattii NOT DETECTED NOT DETECTED Final    Comment: Performed at [facility], [address], [place], [place] [address]         Radiology Studies: No results found.      Scheduled Meds:  atorvastatin  20 mg Oral Daily   azithromycin  500 mg Oral Daily   enoxaparin (LOVENOX) injection  40 mg Subcutaneous Q24H  famotidine  40 mg Oral Daily   feeding supplement  237 mL Oral TID BM   insulin aspart  0-5 Units Subcutaneous QHS   insulin aspart  0-9 Units Subcutaneous TID WC   insulin glargine-yfgn  10 Units Subcutaneous Daily   multivitamin with minerals  1 tablet Oral Daily   Continuous Infusions:  sodium chloride 250 mL (02/02/21 1809)     LOS: 4 days    Enzo Bi, MD [facility] Pager [phone]  If 7PM-7AM, please contact night-coverage 02/05/2021, 5:30 PM

## 2021-02-05 NOTE — Progress Notes (Signed)
Physical Therapy Treatment Patient Details Name: Louis Stone MRN: [id number] DOB: 10-13-1950 Today's Date: 02/05/2021    History of Present Illness Louis Stone is a 12yoM who comes to [facility] on 02/01/21 c weakness and SOB x 4 days. PMH: DM2, GERD, HTN, HLD, StgIV SCC oropharynx/tonsils s/p chemoradiation. COVID/FLu (-). Pt admitted with severe sepsis 2/2 multifocal PNA, admitted to SDU requriing suuplemental O2.    PT Comments    Pt seated in bed agreeable to PT services. Pt PLOF is ambulating with no AD so focus of session is gait training with no AD. Supervision to stand with no AD with initial need of SUE support on stable surface due to slight unsteadiness. Throughout gait pt displayed no LOB with step through pattern with intermittent need of SUE support touch on objects for stability however no LOB. Slight lateral sway noted throughout session. Min guard provided for safety. Remainder of session focused on Le strengthening with good form/technique with exercise following multi step commands for exercises with no difficulty. Pt denying STR so plan is to return home per documentation. Pt would benefit from [facility] PT services to progress safe ambulation with LRAD in household to reduce risk of falls if pt continues to deny STR placement.     Follow Up Recommendations  Supervision - Intermittent;SNF     Equipment Recommendations  None recommended by PT    Recommendations for Other Services       Precautions / Restrictions Precautions Precautions: Fall Restrictions Weight Bearing Restrictions: No    Mobility  Bed Mobility Overal bed mobility: Modified Independent                  Transfers Overall transfer level: Needs assistance Equipment used: None Transfers: Sit to/from Stand Sit to Stand: Supervision         General transfer comment: Safe stand without AD. Does initially require stable surface with SUE.  Ambulation/Gait Ambulation/Gait assistance: Min  guard Gait Distance (Feet): 50 Feet Assistive device: None Gait Pattern/deviations: Step-through pattern;Narrow base of support     General Gait Details: Demo'ing safer amb with no AD. SLightly unstable with lat sway but no LOB noted. INtermittent reaching of SUE on stable surfaces.   Stairs             Wheelchair Mobility    Modified Rankin (Stroke Patients Only)       Balance Overall balance assessment: Needs assistance Sitting-balance support: No upper extremity supported;Feet supported Sitting balance-Leahy Scale: Good     Standing balance support: Single extremity supported Standing balance-Leahy Scale: Fair Standing balance comment: Good standing balance with no AD. Initially used UE on counter inside room.                            Cognition Arousal/Alertness: Awake/alert Behavior During Therapy: WFL for tasks assessed/performed Overall Cognitive Status: No family/caregiver present to determine baseline cognitive functioning                                 General Comments: Slightly agitated due to PT having difficulty understanding pt's words.      Exercises General Exercises - Lower Extremity Ankle Circles/Pumps: AROM;Supine;10 reps Long Arc Quad: AROM;Strengthening;Seated;Both;20 reps Hip Flexion/Marching: AROM;Strengthening;Both;Supine;20 reps Toe Raises: Strengthening;Both;20 reps;Seated Heel Raises: Strengthening;Both;20 reps;Seated Other Exercises Other Exercises: x10 STS    General Comments        Pertinent Vitals/Pain Pain  Assessment: No/denies pain    Home Living                      Prior Function            PT Goals (current goals can now be found in the care plan section) Acute Rehab PT Goals Patient Stated Goal: to go home once safe to PT Goal Formulation: With patient Time For Goal Achievement: 02/16/21 Potential to Achieve Goals: Good Progress towards PT goals: Progressing toward  goals    Frequency    Min 2X/week      PT Plan Current plan remains appropriate    Co-evaluation              AM-PAC PT "6 Clicks" Mobility   Outcome Measure  Help needed turning from your back to your side while in a flat bed without using bedrails?: None Help needed moving from lying on your back to sitting on the side of a flat bed without using bedrails?: None Help needed moving to and from a bed to a chair (including a wheelchair)?: None Help needed standing up from a chair using your arms (e.g., wheelchair or bedside chair)?: None Help needed to walk in hospital room?: A Little Help needed climbing 3-5 steps with a railing? : A Little 6 Click Score: 22    End of Session Equipment Utilized During Treatment: Gait belt Activity Tolerance: Patient tolerated treatment well Patient left: in bed;with bed alarm set;with call bell/phone within reach;with family/visitor present;with nursing/sitter in room   PT Visit Diagnosis: Unsteadiness on feet (R26.81);Other abnormalities of gait and mobility (R26.89)     Time: [id number] PT Time Calculation (min) (ACUTE ONLY): 27 min  Charges:  $Gait Training: 8-22 mins $Therapeutic Exercise: 8-22 mins                     Emalie Mcwethy M. Fairly IV, PT, DPT Physical Therapist- [facility]  02/05/2021, 12:26 PM

## 2021-02-06 LAB — BLOOD CULTURE ID PANEL (REFLEXED) - BCID2

## 2021-02-06 LAB — BASIC METABOLIC PANEL WITH GFR
Anion gap: 7 (ref 5–15)
BUN: 20 mg/dL (ref 8–23)
CO2: 28 mmol/L (ref 22–32)
Calcium: 8.7 mg/dL — ABNORMAL LOW (ref 8.9–10.3)
Chloride: 97 mmol/L — ABNORMAL LOW (ref 98–111)
Creatinine, Ser: 0.74 mg/dL (ref 0.61–1.24)
GFR, Estimated: >60 mL/min
Glucose, Bld: 215 mg/dL — ABNORMAL HIGH (ref 70–99)
Potassium: 4.4 mmol/L (ref 3.5–5.1)
Sodium: 132 mmol/L — ABNORMAL LOW (ref 135–145)

## 2021-02-06 LAB — CBC
HCT: 35.4 % — ABNORMAL LOW (ref 39.0–52.0)
Hemoglobin: 11.7 g/dL — ABNORMAL LOW (ref 13.0–17.0)
MCH: 27.1 pg (ref 26.0–34.0)
MCHC: 33.1 g/dL (ref 30.0–36.0)
MCV: 81.9 fL (ref 80.0–100.0)
Platelets: 181 K/uL (ref 150–400)
RBC: 4.32 MIL/uL (ref 4.22–5.81)
RDW: 14.6 % (ref 11.5–15.5)
WBC: 7.8 K/uL (ref 4.0–10.5)
nRBC: 0 % (ref 0.0–0.2)

## 2021-02-06 LAB — GLUCOSE, CAPILLARY
Glucose-Capillary: 175 mg/dL — ABNORMAL HIGH (ref 70–99)
Glucose-Capillary: 252 mg/dL — ABNORMAL HIGH (ref 70–99)
Glucose-Capillary: 316 mg/dL — ABNORMAL HIGH (ref 70–99)

## 2021-02-06 LAB — CULTURE, BLOOD (ROUTINE X 2)
Culture: NO GROWTH
Special Requests: ADEQUATE

## 2021-02-06 LAB — MAGNESIUM: Magnesium: 1.9 mg/dL (ref 1.7–2.4)

## 2021-02-06 NOTE — Discharge Summary (Signed)
Physician Discharge Summary   Louis Stone  male DOB: 05-27-1951  YPP:[id number]  PCP: Theotis Burrow, MD  Admit date: 02/01/2021 Discharge date: 02/06/2021  Admitted From: home Disposition:  home.  Pt declined SNF rehab. Home Health: Yes CODE STATUS: Full code  Discharge Instructions     Discharge instructions   Complete by: As directed    You have completed 5 days of IV antibiotics for your pneumonia.  You have improved and back on room air, so you can go home to recover.  The positive growth from your blood cultures were contamination, not real infection.   Dr. Enzo Bi [facility] Course:  For full details, please see H&P, progress notes, consult notes and ancillary notes.  Briefly,  Louis Stone is a 70 y.o. 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     Acute hypoxic respiratory failure secondary to  multifocal pneumonia.  --on presentation, O2 sats 70's%, Placed initially on NRB followed by high flow nasal cannula at 55 L, then down to 8 L nasal cannula.  Was admitted to stepdown.  Weaned down to RA quickly. --finished 5 days of ceftriaxone and azithromycin.   Repeat blood cx 1 of 4 pos for GPC --each GPC was a different species, so ID deemed them contaminant.  No need to treat.    Severe sepsis met criteria w/ tachypnea, elevated lactic acid, leukocytosis, hypotension & pneumonia.  --resolved with abx as above.   Bed bugs Reportedly w/ infestation of roaches as well at pt's home. APS was called as per speech. See speech's note for more information. S/p permethrin given. Continue on contact precautions.     Leukocytosis:  resolved    Lactic acidosis: resolved   AKI: resolved  --Cr 3.07 on presentation.  AKI resolved with IVF and treatment of infection.  Cr 0.74 on the day of discharge.   Elevated troponin: likely secondary to demand ischemia   DM2: Hypoglycemic episodes well controlled w/ HbA1c 6.8.  --Long-acting held initially due to hypoglycemia.  BG trending up towards end of hospitalization, and pt was resumed on long-acting insulin at reduced dose of 10u daily with SSI.   --pt discharged on home regimen as below.   HLD:  continue on statin    Hypophosphatemia:  phosp repleated PRN   GERD:  continue on H2 blocker    Generalized weakness:  PT/OT recs SNF, pt wants to go home.     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  Management per onco & ENT outpatient.   Severe protein calorie malnutrition:  nutritional supplements while inpatient     Discharge Diagnoses:  Principal Problem:   Severe sepsis ([place]) Active Problems:   Mixed diabetic hyperlipidemia associated with type 2 diabetes mellitus ([place])   Uncontrolled type 2 diabetes mellitus with hypoglycemia without coma, with long-term current use of insulin (HCC)   GERD without esophagitis   Acute respiratory failure with hypoxia (HCC)   Squamous cell carcinoma of oropharynx (HCC)   Acute renal failure (ARF) (HCC)   Protein-calorie malnutrition, severe   30 Day Unplanned Readmission Risk Score    Flowsheet Row ED to Hosp-Admission (Current) from 02/01/2021  in [place] (1C)  30 Day Unplanned Readmission Risk Score (%) 18.24 Filed at 02/06/2021 1200       This score is the patient's risk of an unplanned readmission within 30 days of being discharged (0 -100%). The score is based on dignosis, age, lab data, medications, orders, and past utilization.   Low:  0-14.9   Medium: 15-21.9   High: 22-29.9   Extreme: 30 and above         Discharge  Instructions:  Allergies as of 02/06/2021   No Known Allergies      Medication List     STOP taking these medications    guaiFENesin-dextromethorphan 100-10 MG/5ML syrup Commonly known as: ROBITUSSIN DM   Potassium Chloride ER 20 MEQ Tbcr       TAKE these medications    Accu-Chek Aviva Plus test strip Generic drug: glucose blood   acetaminophen 325 MG tablet Commonly known as: TYLENOL Take 650 mg by mouth every 6 (six) hours as needed for headache or mild pain.   albuterol 108 (90 Base) MCG/ACT inhaler Commonly known as: VENTOLIN HFA Inhale 2 puffs into the lungs every 6 (six) hours as needed for wheezing.   atorvastatin 20 MG tablet Commonly known as: LIPITOR Take 20 mg by mouth daily.   benzonatate 200 MG capsule Commonly known as: TESSALON Take 200 mg by mouth 3 (three) times daily as needed for cough.   famotidine 40 MG tablet Commonly known as: PEPCID Take 40 mg by mouth daily.   lisinopril 5 MG tablet Commonly known as: ZESTRIL Take 5 mg by mouth daily.   omeprazole 40 MG capsule Commonly known as: PRILOSEC Take 40 mg by mouth daily.   ondansetron 4 MG tablet Commonly known as: Zofran Take 1 tablet (4 mg total) by mouth daily as needed for nausea or vomiting.   Toujeo SoloStar 300 UNIT/ML Solostar Pen Generic drug: insulin glargine (1 Unit Dial) Inject 15 Units into the skin daily.         Follow-up Information     Revelo, Elyse Jarvis, MD Follow up in 1 week(s).   Specialty: Family Medicine Why: Office will call you Contact information: [address] [place] [address] [phone]                 No Known Allergies   The results of significant diagnostics from this hospitalization (including imaging, microbiology, ancillary and laboratory) are listed below for reference.   Consultations:   Procedures/Studies: DG Chest Portable 1 View  Result Date: 02/01/2021 CLINICAL DATA:  Shortness of breath EXAM:  PORTABLE CHEST 1 VIEW COMPARISON:  07/17/2020 FINDINGS: Patchy bilateral airspace disease most compatible with pneumonia. Heart is normal size. No effusions or acute bony abnormality. IMPRESSION: Patchy bilateral airspace disease concerning for pneumonia. Electronically Signed   By: Rolm Baptise M.D.   On: 02/01/2021 12:31      Labs: BNP (last 3 results) Recent Labs    02/01/21 1209  BNP 33.3   Basic Metabolic Panel: Recent Labs  Lab 02/02/21 0038 02/03/21 0553 02/04/21 0445 02/05/21 0450 02/06/21 0400  NA 136 137 134* 134* 132*  K 4.3 4.0 4.1 4.6 4.4  CL 103 101 100 97* 97*  CO2 _0 GLUCOSE 116* 70 159* 193* 215*  BUN 44* 25* _1 CREATININE 1.60* 0.92 0.72 0.67 0.74  CALCIUM 8.3* 8.5* 8.3* 8.6* 8.7*  MG  --  2.0  --  1.8 1.9  PHOS  --  1.9* 2.0* 2.7  --    Liver Function Tests: Recent Labs  Lab 02/01/21 1209  AST 30  ALT 14  ALKPHOS 63  BILITOT 1.5*  PROT 7.4  ALBUMIN 3.1*   No results for input(s): LIPASE, AMYLASE in the last 168 hours. No results for input(s): AMMONIA in the last 168 hours. CBC: Recent Labs  Lab 02/01/21 1209 02/02/21 0038 02/03/21 0553 02/04/21 0445 02/05/21 0450 02/06/21 0400  WBC 11.9* 14.3* 8.4 5.7 5.6 7.8  NEUTROABS 10.6*  --   --   --   --   --   HGB 12.8* 11.5* 11.3* 11.3* 11.4* 11.7*  HCT 39.1 34.7* 33.8* 34.3* 34.3* 35.4*  MCV 84.1 82.8 83.7 82.9 81.5 81.9  PLT 220 195 184 176 183 181   Cardiac Enzymes: No results for input(s): CKTOTAL, CKMB, CKMBINDEX, TROPONINI in the last 168 hours. BNP: Invalid input(s): POCBNP CBG: Recent Labs  Lab 02/05/21 1127 02/05/21 1558 02/05/21 2037 02/06/21 0744 02/06/21 1111  GLUCAP 241* 270* 292* 175* 252*   D-Dimer No results for input(s): DDIMER in the last 72 hours. Hgb A1c No results for input(s): HGBA1C in the last 72 hours. Lipid Profile No results for input(s): CHOL, HDL, LDLCALC, TRIG, CHOLHDL, LDLDIRECT in the last 72 hours. Thyroid function  studies No results for input(s): TSH, T4TOTAL, T3FREE, THYROIDAB in the last 72 hours.  Invalid input(s): FREET3 Anemia work up No results for input(s): VITAMINB12, FOLATE, FERRITIN, TIBC, IRON, RETICCTPCT in the last 72 hours. Urinalysis    Component Value Date/Time   COLORURINE YELLOW 07/17/2020 0218   APPEARANCEUR HAZY (A) 07/17/2020 0218   LABSPEC >1.046 (H) 07/17/2020 0218   PHURINE 5.0 07/17/2020 0218   GLUCOSEU 150 (A) 07/17/2020 0218   HGBUR SMALL (A) 07/17/2020 0218   BILIRUBINUR NEGATIVE 07/17/2020 0218   KETONESUR NEGATIVE 07/17/2020 0218   PROTEINUR 30 (A) 07/17/2020 0218   NITRITE NEGATIVE 07/17/2020 0218   LEUKOCYTESUR NEGATIVE 07/17/2020 0218   Sepsis Labs Invalid input(s): PROCALCITONIN,  WBC,  LACTICIDVEN Microbiology Recent Results (from the past 240 hour(s))  Resp Panel by RT-PCR (Flu A&B, Covid) Nasopharyngeal Swab     Status: None   Collection Time: 02/01/21 12:09 PM   Specimen: Nasopharyngeal Swab; Nasopharyngeal(NP) swabs in vial transport medium  Result Value Ref Range Status   SARS Coronavirus 2 by RT PCR NEGATIVE NEGATIVE Final    Comment: (NOTE) SARS-CoV-2 target nucleic acids are NOT DETECTED.  The SARS-CoV-2 RNA is generally detectable in upper respiratory specimens during the acute phase of infection. The lowest concentration of SARS-CoV-2 viral copies this assay can detect is 138 copies/mL. A negative result does not preclude SARS-Cov-2 infection and should not be used as the sole basis for treatment or other patient management decisions. A negative result may occur with  improper specimen collection/handling, submission of specimen other than nasopharyngeal swab, presence of viral mutation(s) within the areas targeted by this assay, and inadequate number of viral copies(<138 copies/mL). A negative result must be combined with clinical observations, patient history, and epidemiological information. The expected result is Negative.  Fact  Sheet for Patients:  EntrepreneurPulse.com.au  Fact Sheet for Healthcare Providers:  IncredibleEmployment.be  This test is no t yet approved or cleared by the [place] FDA and  has been authorized for detection and/or diagnosis of SARS-CoV-2 by FDA under an Emergency Use Authorization (EUA). This EUA will remain  in effect (meaning this test can be used) for the duration of  the COVID-19 declaration under Section 564(b)(1) of the Act, 21 U.S.C.section 360bbb-3(b)(1), unless the authorization is terminated  or revoked sooner.       Influenza A by PCR NEGATIVE NEGATIVE Final   Influenza B by PCR NEGATIVE NEGATIVE Final    Comment: (NOTE) The Xpert Xpress SARS-CoV-2/FLU/RSV plus assay is intended as an aid in the diagnosis of influenza from Nasopharyngeal swab specimens and should not be used as a sole basis for treatment. Nasal washings and aspirates are unacceptable for Xpert Xpress SARS-CoV-2/FLU/RSV testing.  Fact Sheet for Patients: EntrepreneurPulse.com.au  Fact Sheet for Healthcare Providers: IncredibleEmployment.be  This test is not yet approved or cleared by the [place] FDA and has been authorized for detection and/or diagnosis of SARS-CoV-2 by FDA under an Emergency Use Authorization (EUA). This EUA will remain in effect (meaning this test can be used) for the duration of the COVID-19 declaration under Section 564(b)(1) of the Act, 21 U.S.C. section 360bbb-3(b)(1), unless the authorization is terminated or revoked.  Performed at [facility], [address] [place], [place] [address]   Culture, blood (routine x 2)     Status: Abnormal (Preliminary result)   Collection Time: 02/01/21 12:48 PM   Specimen: BLOOD  Result Value Ref Range Status   Specimen Description   Final    BLOOD BLOOD RIGHT FOREARM Performed at [name], [address], [place], [place]  [address]    Special Requests   Final    BOTTLES DRAWN AEROBIC AND ANAEROBIC Blood Culture results may not be optimal due to an inadequate volume of blood received in culture bottles Performed at [facility], [address] [place], [place] [address]    Culture  Setup Time   Final    Organism ID to follow [place] TO, READ BACK BY AND VERIFIED WITH: CARISSA DOLAN _0  ON 02/02/21 SKL Performed at [facility], [address] [place], [place] [address]    Culture (A)  Final    STAPHYLOCOCCUS AURICULARIS CULTURE REINCUBATED FOR BETTER GROWTH Performed at [facility], [place] [address] [place], [place] [address]    Report Status PENDING  Incomplete  Culture, blood (routine x 2)     Status: None   Collection Time: 02/01/21 12:48 PM   Specimen: BLOOD  Result Value Ref Range Status   Specimen Description BLOOD RIGHT ANTECUBITAL  Final   Special Requests   Final    BOTTLES DRAWN AEROBIC AND ANAEROBIC Blood Culture adequate volume   Culture   Final    NO GROWTH 5 DAYS Performed at [facility], [address] [place], [place] [address]    Report Status 02/06/2021 FINAL  Final  Blood Culture ID Panel (Reflexed)     Status: Abnormal   Collection Time: 02/01/21 12:48 PM  Result Value Ref Range Status   Enterococcus faecalis NOT DETECTED NOT DETECTED Final   Enterococcus Faecium NOT DETECTED NOT DETECTED Final   Listeria monocytogenes NOT DETECTED NOT DETECTED Final   Staphylococcus species DETECTED (A) NOT DETECTED Final    Comment: CRITICAL RESULT CALLED TO, READ BACK BY AND VERIFIED WITH: CARISSA DOLAN _1  ON 02/02/21 SKL    Staphylococcus aureus (BCID) NOT DETECTED NOT DETECTED Final   Staphylococcus epidermidis NOT DETECTED NOT DETECTED Final   Staphylococcus lugdunensis NOT DETECTED NOT DETECTED Final   Streptococcus species NOT DETECTED NOT DETECTED Final   Streptococcus agalactiae NOT  DETECTED NOT DETECTED Final  Streptococcus pneumoniae NOT DETECTED NOT DETECTED Final   Streptococcus pyogenes NOT DETECTED NOT DETECTED Final   A.calcoaceticus-baumannii NOT DETECTED NOT DETECTED Final   Bacteroides fragilis NOT DETECTED NOT DETECTED Final   Enterobacterales NOT DETECTED NOT DETECTED Final   Enterobacter cloacae complex NOT DETECTED NOT DETECTED Final   Escherichia coli NOT DETECTED NOT DETECTED Final   Klebsiella aerogenes NOT DETECTED NOT DETECTED Final   Klebsiella oxytoca NOT DETECTED NOT DETECTED Final   Klebsiella pneumoniae NOT DETECTED NOT DETECTED Final   Proteus species NOT DETECTED NOT DETECTED Final   Salmonella species NOT DETECTED NOT DETECTED Final   Serratia marcescens NOT DETECTED NOT DETECTED Final   Haemophilus influenzae NOT DETECTED NOT DETECTED Final   Neisseria meningitidis NOT DETECTED NOT DETECTED Final   Pseudomonas aeruginosa NOT DETECTED NOT DETECTED Final   Stenotrophomonas maltophilia NOT DETECTED NOT DETECTED Final   Candida albicans NOT DETECTED NOT DETECTED Final   Candida auris NOT DETECTED NOT DETECTED Final   Candida glabrata NOT DETECTED NOT DETECTED Final   Candida krusei NOT DETECTED NOT DETECTED Final   Candida parapsilosis NOT DETECTED NOT DETECTED Final   Candida tropicalis NOT DETECTED NOT DETECTED Final   Cryptococcus neoformans/gattii NOT DETECTED NOT DETECTED Final    Comment: Performed at [facility], [address] [place], [place] [address]  MRSA Next Gen by PCR, Nasal     Status: None   Collection Time: 02/01/21  4:15 PM   Specimen: Nasal Mucosa; Nasal Swab  Result Value Ref Range Status   MRSA by PCR Next Gen NOT DETECTED NOT DETECTED Final    Comment: (NOTE) The GeneXpert MRSA Assay (FDA approved for NASAL specimens only), is one component of a comprehensive MRSA colonization surveillance program. It is not intended to diagnose MRSA infection nor to guide or monitor treatment for MRSA  infections. Test performance is not FDA approved in patients less than 103 years old. Performed at [facility], [address] [place], [place] [address]   CULTURE, BLOOD (ROUTINE X 2) w Reflex to ID Panel     Status: Abnormal (Preliminary result)   Collection Time: 02/03/21  2:33 PM   Specimen: BLOOD  Result Value Ref Range Status   Specimen Description   Final    BLOOD BLOOD LEFT HAND Performed at [facility], [address], [place], [place] [address]    Special Requests   Final    BOTTLES DRAWN AEROBIC AND ANAEROBIC Blood Culture adequate volume Performed at [facility], [address] [place], [place] [address]    Culture  Setup Time   Final    GRAM POSITIVE COCCI AEROBIC BOTTLE ONLY Gram Stain Report Called to,Read Back By and Verified With: MORGAN HICKS 02/05/21 @ 0900 BY S BEARD    Culture (A)  Final    STAPHYLOCOCCUS COHNII CULTURE REINCUBATED FOR BETTER GROWTH Performed at [facility], [place] [address], [place], [place] [address]    Report Status PENDING  Incomplete  CULTURE, BLOOD (ROUTINE X 2) w Reflex to ID Panel     Status: None (Preliminary result)   Collection Time: 02/03/21  2:33 PM   Specimen: BLOOD  Result Value Ref Range Status   Specimen Description BLOOD BLOOD RIGHT HAND  Final   Special Requests   Final    BOTTLES DRAWN AEROBIC AND ANAEROBIC Blood Culture adequate volume   Culture   Final    NO GROWTH 3 DAYS Performed at [facility], [place]  [address], [place], [place] [address]    Report Status PENDING  Incomplete  Blood Culture ID Panel (Reflexed)     Status: Abnormal   Collection Time: 02/03/21  2:33 PM  Result Value Ref Range Status   Enterococcus faecalis NOT DETECTED NOT DETECTED Final    Comment: Performed at [facility], [address] [place], [place] [address]   Enterococcus Faecium NOT DETECTED NOT DETECTED Final    Comment: Performed at [facility], [address]  [place], [place] [address]   Listeria monocytogenes NOT DETECTED NOT DETECTED Final    Comment: Performed at [facility], [address] [place], [place] [address]   Staphylococcus species DETECTED (A) NOT DETECTED Final    Comment: MORGAN HICKS 02/05/21 @ 0900 BY S BEARD TEST WILL BE CREDITED Performed at [facility], [place] [address] [place], [place] [address]    Staphylococcus aureus (BCID) NOT DETECTED NOT DETECTED Final   Staphylococcus epidermidis NOT DETECTED NOT DETECTED Final   Staphylococcus lugdunensis NOT DETECTED NOT DETECTED Final   Streptococcus species NOT DETECTED NOT DETECTED Final   Streptococcus agalactiae NOT DETECTED NOT DETECTED Final   Streptococcus pneumoniae NOT DETECTED NOT DETECTED Final   Streptococcus pyogenes NOT DETECTED NOT DETECTED Final   A.calcoaceticus-baumannii NOT DETECTED NOT DETECTED Final   Bacteroides fragilis NOT DETECTED NOT DETECTED Final   Enterobacterales NOT DETECTED NOT DETECTED Final   Enterobacter cloacae complex NOT DETECTED NOT DETECTED Final   Escherichia coli NOT DETECTED NOT DETECTED Final   Klebsiella aerogenes NOT DETECTED NOT DETECTED Final   Klebsiella oxytoca NOT DETECTED NOT DETECTED Final   Klebsiella pneumoniae NOT DETECTED NOT DETECTED Final   Proteus species NOT DETECTED NOT DETECTED Final   Salmonella species NOT DETECTED NOT DETECTED Final   Serratia marcescens NOT DETECTED NOT DETECTED Final   Haemophilus influenzae NOT DETECTED NOT DETECTED Final   Neisseria meningitidis NOT DETECTED NOT DETECTED Final   Pseudomonas aeruginosa NOT DETECTED NOT DETECTED Final   Stenotrophomonas maltophilia NOT DETECTED NOT DETECTED Final   Candida albicans NOT DETECTED NOT DETECTED Final   Candida auris NOT DETECTED NOT DETECTED Final   Candida glabrata NOT DETECTED NOT DETECTED Final   Candida krusei NOT DETECTED NOT DETECTED Final   Candida parapsilosis NOT DETECTED NOT DETECTED Final   Candida tropicalis NOT DETECTED  NOT DETECTED Final   Cryptococcus neoformans/gattii NOT DETECTED NOT DETECTED Final    Comment: Performed at [facility], [address] [place], [place] [address]     Total time spend on discharging this patient, including the last patient exam, discussing the hospital stay, instructions for ongoing care as it relates to all pertinent caregivers, as well as preparing the medical discharge records, prescriptions, and/or referrals as applicable, is 45 minutes.    Enzo Bi, MD  [facility] 02/06/2021, 2:58 PM

## 2021-02-06 NOTE — TOC Transition Note (Signed)
Transition of Care [facility]) - CM/SW Discharge Note   Patient Details  Name: Louis Stone MRN: [id number] Date of Birth: 06/30/1951  [facility] CM/SW Contact:  Shelbie Hutching, RN Phone Number: 02/06/2021, 3:48 PM   Clinical Narrative:    Patient is now medically cleared for discharge home with home health services.  Cory with [facility] notified of discharge today.  Patient needs a ride home so RNCM has provided a taxi voucher with Ladona Mow.     Final next level of care: [place] Barriers to Discharge: Barriers Resolved   Patient Goals and CMS Choice Patient states their goals for this hospitalization and ongoing recovery are:: Patient wants to go home does not want to go to SNF CMS Medicare.gov Compare Post Acute Care list provided to:: Patient Choice offered to / list presented to : Patient  Discharge Placement                       Discharge Plan and Services   Discharge Planning Services: CM Consult Post Acute Care Choice: Home Health          DME Arranged: N/A DME Agency: NA       HH Arranged: RN, PT, OT, Social Work, Theme park manager Therapy HH Agency: [place] Date [facility] Agency Contacted: 02/06/21 Time [place]: 1548 Representative spoke with at [place]: [place] ([place]) Interventions     Readmission Risk Interventions Readmission Risk Prevention Plan 02/03/2021  Transportation Screening Complete  PCP or Specialist Appt within 3-5 Days Complete  HRI or [place] Complete  Social Work Consult for [place] Planning/Counseling Complete  Palliative Care Screening Not Applicable  Medication Review Press photographer) Complete  Some recent data might be hidden

## 2021-02-06 NOTE — Progress Notes (Signed)
Pt IV removed, discharge instructions provided, pt refused help getting dressed and insisted on doing on his own, Ladona Mow called and stated he is #3 on list.

## 2021-02-08 LAB — CULTURE, BLOOD (ROUTINE X 2)
Culture: NO GROWTH
Special Requests: ADEQUATE

## 2021-02-09 ENCOUNTER — Encounter: Payer: Medicare Other | Admitting: Speech Pathology

## 2021-02-09 LAB — CULTURE, BLOOD (ROUTINE X 2): Special Requests: ADEQUATE

## 2021-02-11 ENCOUNTER — Encounter: Payer: Medicare Other | Admitting: Speech Pathology

## 2021-02-15 ENCOUNTER — Encounter: Payer: Self-pay | Admitting: Emergency Medicine

## 2021-02-15 ENCOUNTER — Other Ambulatory Visit: Payer: Self-pay

## 2021-02-15 DIAGNOSIS — Z8616 Personal history of COVID-19: Secondary | ICD-10-CM | POA: Diagnosis not present

## 2021-02-15 DIAGNOSIS — Z20822 Contact with and (suspected) exposure to covid-19: Secondary | ICD-10-CM | POA: Insufficient documentation

## 2021-02-15 DIAGNOSIS — I10 Essential (primary) hypertension: Secondary | ICD-10-CM | POA: Diagnosis not present

## 2021-02-15 DIAGNOSIS — Z794 Long term (current) use of insulin: Secondary | ICD-10-CM | POA: Diagnosis not present

## 2021-02-15 DIAGNOSIS — E871 Hypo-osmolality and hyponatremia: Secondary | ICD-10-CM | POA: Diagnosis not present

## 2021-02-15 DIAGNOSIS — Z85818 Personal history of malignant neoplasm of other sites of lip, oral cavity, and pharynx: Secondary | ICD-10-CM | POA: Diagnosis not present

## 2021-02-15 DIAGNOSIS — R1084 Generalized abdominal pain: Secondary | ICD-10-CM | POA: Diagnosis not present

## 2021-02-15 DIAGNOSIS — E119 Type 2 diabetes mellitus without complications: Secondary | ICD-10-CM | POA: Insufficient documentation

## 2021-02-15 DIAGNOSIS — D72829 Elevated white blood cell count, unspecified: Secondary | ICD-10-CM | POA: Diagnosis not present

## 2021-02-15 DIAGNOSIS — Z87891 Personal history of nicotine dependence: Secondary | ICD-10-CM | POA: Diagnosis not present

## 2021-02-15 DIAGNOSIS — R531 Weakness: Secondary | ICD-10-CM | POA: Diagnosis present

## 2021-02-15 DIAGNOSIS — Z79899 Other long term (current) drug therapy: Secondary | ICD-10-CM | POA: Diagnosis not present

## 2021-02-15 LAB — CBC
HCT: 32.1 % — ABNORMAL LOW (ref 39.0–52.0)
Hemoglobin: 10.9 g/dL — ABNORMAL LOW (ref 13.0–17.0)
MCH: 27.8 pg (ref 26.0–34.0)
MCHC: 34 g/dL (ref 30.0–36.0)
MCV: 81.9 fL (ref 80.0–100.0)
Platelets: 420 K/uL — ABNORMAL HIGH (ref 150–400)
RBC: 3.92 MIL/uL — ABNORMAL LOW (ref 4.22–5.81)
RDW: 14.9 % (ref 11.5–15.5)
WBC: 15.3 K/uL — ABNORMAL HIGH (ref 4.0–10.5)
nRBC: 0 % (ref 0.0–0.2)

## 2021-02-15 LAB — BASIC METABOLIC PANEL
Anion gap: 9 (ref 5–15)
BUN: 26 mg/dL — ABNORMAL HIGH (ref 8–23)
CO2: 26 mmol/L (ref 22–32)
Calcium: 8.5 mg/dL — ABNORMAL LOW (ref 8.9–10.3)
Chloride: 94 mmol/L — ABNORMAL LOW (ref 98–111)
Creatinine, Ser: 1.1 mg/dL (ref 0.61–1.24)
GFR, Estimated: >60 mL/min (ref >60)
Glucose, Bld: 193 mg/dL — ABNORMAL HIGH (ref 70–99)
Potassium: 5.6 mmol/L — ABNORMAL HIGH (ref 3.5–5.1)
Sodium: 129 mmol/L — ABNORMAL LOW (ref 135–145)

## 2021-02-15 NOTE — ED Triage Notes (Signed)
First RN Note: Pt to ED via [facility] from home with c/o increasing weakness, per EMS pt seen last week and dx with pneumonia.     96% RA CBG [id number] systolic

## 2021-02-15 NOTE — ED Triage Notes (Signed)
Pt arrived via [facility] with c/o weakness for several days, here recently dx with PNA states weak since then.

## 2021-02-16 ENCOUNTER — Emergency Department: Payer: Medicare Other

## 2021-02-16 ENCOUNTER — Emergency Department
Admission: EM | Admit: 2021-02-16 | Discharge: 2021-02-16 | Disposition: A | Discharge status: Home / Self Care | Payer: Medicare Other | Attending: Emergency Medicine | Admitting: Emergency Medicine

## 2021-02-16 ENCOUNTER — Encounter: Payer: Medicare Other | Admitting: Speech Pathology

## 2021-02-16 DIAGNOSIS — R1084 Generalized abdominal pain: Secondary | ICD-10-CM

## 2021-02-16 DIAGNOSIS — R531 Weakness: Secondary | ICD-10-CM

## 2021-02-16 DIAGNOSIS — E871 Hypo-osmolality and hyponatremia: Secondary | ICD-10-CM

## 2021-02-16 LAB — URINALYSIS, COMPLETE (UACMP) WITH MICROSCOPIC
Bacteria, UA: NONE SEEN
Bilirubin Urine: NEGATIVE
Glucose, UA: 50 mg/dL — AB
Hgb urine dipstick: NEGATIVE
Ketones, ur: NEGATIVE mg/dL
Leukocytes,Ua: NEGATIVE
Nitrite: NEGATIVE
Protein, ur: NEGATIVE mg/dL
Specific Gravity, Urine: 1.025 (ref 1.005–1.030)
pH: 5 (ref 5.0–8.0)

## 2021-02-16 LAB — HEPATIC FUNCTION PANEL
ALT: 23 U/L (ref 0–44)
AST: 23 U/L (ref 15–41)
Albumin: 3.2 g/dL — ABNORMAL LOW (ref 3.5–5.0)
Alkaline Phosphatase: 69 U/L (ref 38–126)
Bilirubin, Direct: 0.2 mg/dL (ref 0.0–0.2)
Indirect Bilirubin: 1 mg/dL — ABNORMAL HIGH (ref 0.3–0.9)
Total Bilirubin: 1.2 mg/dL (ref 0.3–1.2)
Total Protein: 7.6 g/dL (ref 6.5–8.1)

## 2021-02-16 LAB — MAGNESIUM: Magnesium: 2.3 mg/dL (ref 1.7–2.4)

## 2021-02-16 LAB — RESP PANEL BY RT-PCR (FLU A&B, COVID) ARPGX2
Influenza A by PCR: NEGATIVE
Influenza B by PCR: NEGATIVE
SARS Coronavirus 2 by RT PCR: NEGATIVE

## 2021-02-16 LAB — PROCALCITONIN: Procalcitonin: 0.54 ng/mL

## 2021-02-16 LAB — LIPASE, BLOOD: Lipase: 31 U/L (ref 11–51)

## 2021-02-16 MED ORDER — IOHEXOL 350 MG/ML SOLN
75.0000 mL | Freq: Once | INTRAVENOUS | Status: AC | PRN
  Administered 2021-02-16: 75 mL via INTRAVENOUS

## 2021-02-16 MED ORDER — LACTATED RINGERS IV BOLUS
1000.0000 mL | Freq: Once | INTRAVENOUS | Status: AC
  Administered 2021-02-16: 1000 mL via INTRAVENOUS

## 2021-02-16 NOTE — ED Notes (Signed)
RN to bedside to introduce self to pt. Pt is CAO and watching TV. RN unable to understand what pt is saying. He is attempting to communicate but unable to understand what he is saying. No signs of distress.

## 2021-02-16 NOTE — ED Provider Notes (Signed)
[facility] Emergency Department Provider Note  ____________________________________________   Event Date/Time   First MD Initiated Contact with Patient 02/16/21 [phone]     (approximate)  I have reviewed the triage vital signs and the nursing notes.   HISTORY  Chief Complaint Weakness   HPI Louis Stone is a 70 y.o. 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  Patient states his cough has improved and he has no shortness of breath or chest pain.  He denies any headache, earache, sore throat, fevers, vomiting, diarrhea, pain with urination or new extremity pain or rash.  No recent injuries or falls.  States his pain feels little crampy and that he last had a bowel movement today before yesterday may be a little constipated but has been eating and drinking he typically does.  He has not been using any alcohol or illicit drugs.  Denies any other acute concerns at this time.         Past Medical History:  Diagnosis Date   Arthritis    per pt. I do not have md notes that indicate dx.   Diabetes mellitus without complication ([place])    Oropharynx cancer ([place]) 2009   squamous cell cancer  Stage T3, N2c, M0   Personal history of tobacco use, presenting hazards to health 10/08/2015    Patient Active Problem List   Diagnosis Date Noted   Protein-calorie malnutrition, severe 02/02/2021   Acute respiratory failure with hypoxia ([place]) 02/01/2021   Squamous cell carcinoma of oropharynx ([place]) 02/01/2021   Acute renal failure (ARF) ([place]) 02/01/2021   Acute left-sided weakness [id number]    Acute metabolic encephalopathy [id number]   Mixed diabetic hyperlipidemia associated with type 2 diabetes mellitus ([place]) 07/16/2020   Uncontrolled type 2 diabetes mellitus with hypoglycemia without coma, with long-term current use of insulin ([place]) 07/16/2020   COVID-19 virus infection 07/16/2020   GERD without esophagitis 07/16/2020   Bradycardia 07/16/2020   Severe sepsis ([place]) 09/16/2016   Personal history of tobacco use, presenting hazards to health 10/08/2015    Past Surgical History:  Procedure Laterality Date   [place]   pt states ruptured disc and had surgery to repair it    Prior to Admission medications   Medication Sig Start Date End Date Taking? Authorizing Provider  ACCU-CHEK AVIVA PLUS test strip  09/04/20   [provider]  acetaminophen (TYLENOL) 325 MG tablet Take 650 mg by mouth every 6 (six) hours as needed for headache or mild pain.    [provider]  albuterol (PROVENTIL HFA;VENTOLIN HFA) 108 (90 Base) MCG/ACT inhaler Inhale 2 puffs into the lungs every 6 (six) hours as needed for wheezing.    [provider]  atorvastatin (LIPITOR) 20 MG tablet Take 20 mg by mouth daily. 06/06/20   [provider]  benzonatate (TESSALON) 200 MG capsule Take 200 mg by mouth 3 (three) times daily as needed for cough.    [provider]  famotidine (PEPCID) 40 MG tablet Take 40 mg by mouth daily.    [provider]  lisinopril (ZESTRIL) 5 MG tablet Take 5 mg by mouth daily. 06/06/20   [provider]  omeprazole (PRILOSEC) 40 MG capsule Take 40 mg by mouth daily. 06/06/20   [provider]  TOUJEO SOLOSTAR 300 UNIT/ML Solostar Pen Inject 15 Units into the skin daily. 07/21/20   Terrilee Croak, MD    Allergies Patient has no known allergies.  Family History  Problem Relation Age of Onset   CVA Father    Prostate cancer Neg Hx        not sure what family member    Social History Social  History   Tobacco Use   Smoking status: Former    Packs/day: 1.50    Years: 40.00    Pack years: 60.00    Types: Cigarettes    Quit date: 07/05/2006    Years since quitting: 14.6   Smokeless tobacco: Never  Substance Use Topics   Alcohol use: No    Comment: quit in 1991   Drug use: No    Review of Systems  Review of Systems  Constitutional:  Negative for chills and fever.  HENT:  Negative for sore throat.   Eyes:  Negative for pain.  Respiratory:  Negative for cough and stridor.   Cardiovascular:  Negative for chest pain.  Gastrointestinal:  Positive for abdominal pain. Negative for vomiting.  Genitourinary:  Negative for dysuria.  Musculoskeletal:  Negative for myalgias.  Skin:  Negative for rash.  Neurological:  Positive for weakness. Negative for seizures, loss of consciousness and headaches.  Psychiatric/Behavioral:  Negative for suicidal ideas.   All other systems reviewed and are negative.    ____________________________________________   PHYSICAL EXAM:  VITAL SIGNS: ED Triage Vitals  Enc Vitals Group     BP 02/15/21 2041 101/65     Pulse Rate 02/15/21 2041 78     Resp 02/15/21 2041 18     Temp --      Temp src --      SpO2 02/15/21 2041 96 %     Weight 02/15/21 2042 120 lb 13 oz (54.8 kg)     Height 02/15/21 2042 '5\' 3"'$  (1.6 m)     Head Circumference --      Peak Flow --      Pain Score 02/15/21 2042 5     Pain Loc --      Pain Edu? --      Excl. in [place]? --    Vitals:   02/16/21 0313 02/16/21 0632  BP: 115/73 (!) 150/75  Pulse: 64 70  Resp: 18 16  Temp:  97.9 F (36.6 C)  SpO2: 97% 98%   Physical Exam Vitals and nursing note reviewed.  Constitutional:      Appearance: He is well-developed.  HENT:     Head: Normocephalic and atraumatic.     Right Ear: External ear normal.     Left Ear: External ear normal.     Nose: Nose normal.  Eyes:     Conjunctiva/sclera: Conjunctivae normal.  Cardiovascular:     Rate and Rhythm: Normal rate and  regular rhythm.     Heart sounds: No murmur heard. Pulmonary:     Effort: Pulmonary effort is normal. No respiratory distress.     Breath sounds: Normal breath sounds.  Abdominal:     Palpations: Abdomen is soft.     Tenderness: There is no abdominal tenderness.  Musculoskeletal:     Cervical back: Neck supple.  Skin:    General: Skin is warm and dry.     Capillary Refill: Capillary refill takes 2 to 3 seconds.  Neurological:     Mental Status: He is alert.  Psychiatric:        Mood and Affect: Mood normal.    PERRLA.  EOMI.  Patient speech is very difficult to understand secondary to dental prosthesis and history of oropharyngeal cancer.  He is symmetric strength in his bilateral upper and lower extremities.  Sensation is intact to light touch of all extremities. ____________________________________________   LABS (all labs ordered are listed, but only abnormal results are displayed)  Labs Reviewed  BASIC METABOLIC PANEL - Abnormal; Notable for the following components:      Result Value   Sodium 129 (*)    Potassium 5.6 (*)    Chloride 94 (*)    Glucose, Bld 193 (*)    BUN 26 (*)    Calcium 8.5 (*)    All other components within normal limits  CBC - Abnormal; Notable for the following components:   WBC 15.3 (*)    RBC 3.92 (*)    Hemoglobin 10.9 (*)    HCT 32.1 (*)    Platelets 420 (*)    All other components within normal limits  URINALYSIS, COMPLETE (UACMP) WITH MICROSCOPIC - Abnormal; Notable for the following components:   Color, Urine YELLOW (*)    APPearance HAZY (*)    Glucose, UA 50 (*)    All other components within normal limits  HEPATIC FUNCTION PANEL - Abnormal; Notable for the following components:   Albumin 3.2 (*)    Indirect Bilirubin 1.0 (*)    All other components within normal limits  RESP PANEL BY RT-PCR (FLU A&B, COVID) ARPGX2  MAGNESIUM  LIPASE, BLOOD  PROCALCITONIN   ____________________________________________  EKG  Sinus rhythm  with a ventricular rate of 75, normal axis, unremarkable intervals without clear evidence of acute ischemia or significant arrhythmia. ____________________________________________  RADIOLOGY  ED MD interpretation: No focal consolidation, effusion, edema, pneumothorax or other clear acute intrathoracic process.  CT abdomen pelvis shows bilateral lower lobe bronchopneumonia seen on x-ray without other acute pulmonary pathology.  There is some gas-filled bowel noted without evidence of SBO.  No focal inflammation or evidence of diverticulitis.  No evidence of appendicitis, pancreatitis or cholecystitis.  There is evidence of aortic atherosclerosis.  Cannot exclude on this study possible left femoral AV fistula.  Official radiology report(s): DG Chest 2 View  Result Date: 02/16/2021 CLINICAL DATA:  Weakness. EXAM: CHEST - 2 VIEW COMPARISON:  Chest x-ray dated February 01, 2021. FINDINGS: The heart size and mediastinal contours are within normal limits. Normal pulmonary vascularity. The lungs remain hyperinflated. Improving patchy bibasilar opacities. No pleural effusion or pneumothorax. No acute osseous abnormality. IMPRESSION: 1. Improving multifocal pneumonia. Electronically Signed   By: Titus Dubin M.D.   On: 02/16/2021 05:47   CT ABDOMEN PELVIS W CONTRAST  Result Date: 02/16/2021 CLINICAL DATA:  70 year old male with abdominal pain and weakness. Recently diagnosed with pneumonia. EXAM: CT ABDOMEN AND PELVIS WITH CONTRAST TECHNIQUE: Multidetector CT imaging of the abdomen and pelvis was performed using the standard protocol following bolus administration of intravenous contrast. CONTRAST:  1m OMNIPAQUE IOHEXOL 350 MG/ML SOLN COMPARISON:  CT Abdomen and Pelvis 02/09/2020. Chest radiographs 0532 hours today. FINDINGS: Lower chest: No pericardial or pleural effusion. Small chronic hiatal hernia. Widely scattered peribronchial nodularity and ground-glass opacity in the lower lobes greater on the right.  Chronic scarring in the lingula. No visible consolidation. Hepatobiliary: Negative liver and gallbladder. Pancreas: Negative. Spleen: Negative. Adrenals/Urinary Tract: Normal adrenal glands. Stable kidneys. Left  greater than right midpole benign appearing renal cysts. Mild renal vascular calcifications. Symmetric renal contrast enhancement without obstruction. Diminutive and unremarkable bladder. Stomach/Bowel: No dilated large or small bowel. Redundant and gas-filled large bowel loops throughout the abdomen. No large bowel wall thickening. Normal appendix on series 2, image 59. Gas-filled nondilated distal small bowel in the pelvis. Decompressed stomach. No free air or free fluid. No mesenteric inflammation identified. Vascular/Lymphatic: Extensive Aortoiliac calcified atherosclerosis. Major arterial structures remain patent in the abdomen and pelvis. There is early venous enhancement in the left common femoral vein at the femoral artery bifurcation level, similar to last year. No discrete vascular malformation is identified. Portal venous system is patent. No lymphadenopathy. Reproductive: Negative. Other: No pelvic free fluid. Musculoskeletal: Chronic disc and endplate degeneration at the lumbosacral junction. No acute osseous abnormality identified. IMPRESSION: 1. Bilateral lower lobe bronchopneumonia. No consolidation or pleural effusion. 2. Gas-filled but nondilated large and small bowel in the abdomen and pelvis might indicate Ileus. No focal bowel inflammation. Normal appendix. Chronic hiatal hernia. 3. Aortic Atherosclerosis (ICD10-I70.0). Chronic early contrast enhancement of the left common femoral vein raises the possibility of a left femoral vessel arteriovenous fistula. Electronically Signed   By: Genevie Ann M.D.   On: 02/16/2021 06:41    ____________________________________________   PROCEDURES  Procedure(s) performed (including Critical Care):  .1-3 Lead EKG Interpretation  Date/Time:  02/16/2021 7:14 AM Performed by: Lucrezia Starch, MD Authorized by: Lucrezia Starch, MD     Interpretation: normal     ECG rate assessment: normal     Rhythm: sinus rhythm     Ectopy: none     Conduction: normal     ____________________________________________   INITIAL IMPRESSION / ASSESSMENT AND PLAN / ED COURSE      Patient presents with above-stated history exam for assessment of some intermittent crampy abdominal pain and weakness after recently being admitted to the hospital for pneumonia.  It seems patient had recommended to go to a SNF for rehab but patient declined this instead of going back home.  Seems he has been doing okay with improving cough patient denying any fevers or shortness of breath.  He states he has been feeling very weak and is not sure if he is constipated little bit.  He has not had any recent falls or injuries or any other clear associated sick symptoms.  States he has been taking his medicines.  Differential includes deconditioning from recent pneumonia, ongoing infection, metabolic derangements, cholecystitis, pancreatitis, diverticulitis, and SBO.  Chest x-ray shows improving consolidations suggestive of improving pneumonia.  No other acute thoracic process.  ECG without evidence of arrhythmia or clear ischemia.  Low suspicion for atypical presentation of ACS.  CT abdomen pelvis shows bilateral lower lobe bronchopneumonia seen on x-ray without other acute pulmonary pathology.  There is some gas-filled bowel noted without evidence of SBO.  No focal inflammation or evidence of diverticulitis.  No evidence of appendicitis, pancreatitis or cholecystitis.  There is evidence of aortic atherosclerosis.  Cannot exclude on this study possible left femoral AV fistula.  BMP remarkable for NA of 129, K of 5.6 and a glucose of 193.  CBC shows leukocytosis with WBC count of 15.3 without evidence of acute anemia.  UA does not appear infected and has no blood.  Exam is  unremarkable.  Hepatic function panel shows no evidence of hepatitis or cholestasis.  Lipase not consistent with acute pancreatitis.  On reassessment patient stating he is feeling little better after some IV  fluids initially given concern for some mild dehydration.  Discussed concern for possible early ileus versus possible ongoing pneumonia given elevated white blood cell count despite improvement in cough.  Also advised patient that he appeared slightly hydrated and that his electrolytes were slightly abnormal and recommended him observation where he could be hydrated and to ensure his electrolytes improved i.e. his hyponatremia and hyperkalemia.  Also advised that at the very least I recommended as PT and OT had recently SNF placement although patient adamantly declines this.  He also states he does not want to be hospitalized understands that he could have condition that could get worse without close observation and treatment.  He is amenable to following up with his PCP in the next 24 hours.  Discharged stable condition.  Strict return precautions advised and discussed.      ____________________________________________   FINAL CLINICAL IMPRESSION(S) / ED DIAGNOSES  Final diagnoses:  Weakness  Generalized abdominal pain  Hyponatremia    Medications  lactated ringers bolus 1,000 mL (1,000 mLs Intravenous New Bag/Given 02/16/21 0610)  iohexol (OMNIPAQUE) 350 MG/ML injection 75 mL (75 mLs Intravenous Contrast Given 02/16/21 [id number])     ED Discharge Orders     None        Note:  This document was prepared using Dragon voice recognition software and may include unintentional dictation errors.    Lucrezia Starch, MD 02/16/21 [phone]

## 2021-02-18 ENCOUNTER — Encounter: Payer: Medicare Other | Admitting: Speech Pathology

## 2021-02-23 ENCOUNTER — Encounter: Payer: Medicare Other | Admitting: Speech Pathology

## 2021-02-25 ENCOUNTER — Encounter: Payer: Medicare Other | Admitting: Speech Pathology

## 2021-03-02 ENCOUNTER — Encounter: Payer: Medicare Other | Admitting: Speech Pathology

## 2021-03-04 ENCOUNTER — Encounter: Payer: Medicare Other | Admitting: Speech Pathology

## 2021-03-11 ENCOUNTER — Emergency Department: Payer: Medicare Other

## 2021-03-11 ENCOUNTER — Encounter: Payer: Medicare Other | Admitting: Speech Pathology

## 2021-03-11 ENCOUNTER — Other Ambulatory Visit: Payer: Self-pay

## 2021-03-11 ENCOUNTER — Emergency Department
Admission: EM | Admit: 2021-03-11 | Discharge: 2021-03-11 | Disposition: A | Discharge status: Home / Self Care | Payer: Medicare Other | Source: Home / Self Care | Attending: Emergency Medicine | Admitting: Emergency Medicine

## 2021-03-11 DIAGNOSIS — Z87891 Personal history of nicotine dependence: Secondary | ICD-10-CM | POA: Insufficient documentation

## 2021-03-11 DIAGNOSIS — I10 Essential (primary) hypertension: Secondary | ICD-10-CM | POA: Insufficient documentation

## 2021-03-11 DIAGNOSIS — A419 Sepsis, unspecified organism: Secondary | ICD-10-CM | POA: Diagnosis not present

## 2021-03-11 DIAGNOSIS — Z20822 Contact with and (suspected) exposure to covid-19: Secondary | ICD-10-CM | POA: Insufficient documentation

## 2021-03-11 DIAGNOSIS — J690 Pneumonitis due to inhalation of food and vomit: Secondary | ICD-10-CM | POA: Diagnosis not present

## 2021-03-11 DIAGNOSIS — G459 Transient cerebral ischemic attack, unspecified: Secondary | ICD-10-CM | POA: Insufficient documentation

## 2021-03-11 DIAGNOSIS — Z794 Long term (current) use of insulin: Secondary | ICD-10-CM | POA: Insufficient documentation

## 2021-03-11 DIAGNOSIS — E119 Type 2 diabetes mellitus without complications: Secondary | ICD-10-CM | POA: Insufficient documentation

## 2021-03-11 DIAGNOSIS — Z79899 Other long term (current) drug therapy: Secondary | ICD-10-CM | POA: Insufficient documentation

## 2021-03-11 DIAGNOSIS — Z8616 Personal history of COVID-19: Secondary | ICD-10-CM | POA: Insufficient documentation

## 2021-03-11 DIAGNOSIS — Z85818 Personal history of malignant neoplasm of other sites of lip, oral cavity, and pharynx: Secondary | ICD-10-CM | POA: Insufficient documentation

## 2021-03-11 DIAGNOSIS — R531 Weakness: Secondary | ICD-10-CM | POA: Insufficient documentation

## 2021-03-11 LAB — COMPREHENSIVE METABOLIC PANEL
ALT: 17 U/L (ref 0–44)
AST: 26 U/L (ref 15–41)
Albumin: 3.9 g/dL (ref 3.5–5.0)
Alkaline Phosphatase: 76 U/L (ref 38–126)
Anion gap: 12 (ref 5–15)
BUN: 25 mg/dL — ABNORMAL HIGH (ref 8–23)
CO2: 24 mmol/L (ref 22–32)
Calcium: 9.4 mg/dL (ref 8.9–10.3)
Chloride: 97 mmol/L — ABNORMAL LOW (ref 98–111)
Creatinine, Ser: 0.97 mg/dL (ref 0.61–1.24)
GFR, Estimated: >60 mL/min (ref >60)
Glucose, Bld: 78 mg/dL (ref 70–99)
Potassium: 4.4 mmol/L (ref 3.5–5.1)
Sodium: 133 mmol/L — ABNORMAL LOW (ref 135–145)
Total Bilirubin: 0.9 mg/dL (ref 0.3–1.2)
Total Protein: 8.4 g/dL — ABNORMAL HIGH (ref 6.5–8.1)

## 2021-03-11 LAB — CBC
HCT: 35.1 % — ABNORMAL LOW (ref 39.0–52.0)
Hemoglobin: 11.9 g/dL — ABNORMAL LOW (ref 13.0–17.0)
MCH: 27.7 pg (ref 26.0–34.0)
MCHC: 33.9 g/dL (ref 30.0–36.0)
MCV: 81.6 fL (ref 80.0–100.0)
Platelets: 274 10*3/uL (ref 150–400)
RBC: 4.3 MIL/uL (ref 4.22–5.81)
RDW: 14.6 % (ref 11.5–15.5)
WBC: 8.2 10*3/uL (ref 4.0–10.5)
nRBC: 0 % (ref 0.0–0.2)

## 2021-03-11 LAB — DIFFERENTIAL
Abs Immature Granulocytes: 0.03 10*3/uL (ref 0.00–0.07)
Basophils Absolute: 0 10*3/uL (ref 0.0–0.1)
Basophils Relative: 0 %
Eosinophils Absolute: 0 10*3/uL (ref 0.0–0.5)
Eosinophils Relative: 0 %
Immature Granulocytes: 0 %
Lymphocytes Relative: 5 %
Lymphs Abs: 0.4 10*3/uL — ABNORMAL LOW (ref 0.7–4.0)
Monocytes Absolute: 0.3 10*3/uL (ref 0.1–1.0)
Monocytes Relative: 4 %
Neutro Abs: 7.5 10*3/uL (ref 1.7–7.7)
Neutrophils Relative %: 91 %

## 2021-03-11 LAB — PROTIME-INR
INR: 1.1 (ref 0.8–1.2)
Prothrombin Time: 13.7 seconds (ref 11.4–15.2)

## 2021-03-11 LAB — RESP PANEL BY RT-PCR (FLU A&B, COVID) ARPGX2
Influenza A by PCR: NEGATIVE
Influenza B by PCR: NEGATIVE
SARS Coronavirus 2 by RT PCR: NEGATIVE

## 2021-03-11 LAB — APTT: aPTT: 31 seconds (ref 24–36)

## 2021-03-11 MED ORDER — GUAIFENESIN 200 MG PO TABS: 200.0000 mg | ORAL_TABLET | Freq: Four times a day (QID) | ORAL | 0 refills | Status: DC | PRN

## 2021-03-11 NOTE — ED Triage Notes (Signed)
See triage note, Pt to ED [facility] from home, states he has been weak for several days and this morning he could barely get out of bed. Pt difficulty to understand, speech slurred. Pt reports this is not normal but he can't "open mouth all the way"

## 2021-03-11 NOTE — Discharge Instructions (Addendum)
Please call and schedule a follow-up appointment with your primary care provider.  If your symptoms change or worsen, return to the emergency department.

## 2021-03-11 NOTE — ED Provider Notes (Signed)
[facility] Emergency Department Provider Note ____________________________________________   Event Date/Time   First MD Initiated Contact with Patient 03/11/21 1157     (approximate)  I have reviewed the triage vital signs and the nursing notes.   HISTORY  Chief Complaint Weakness  HPI Louis Stone is a 70 y.o. 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  He was recently admitted and treated for pneumonia and hypoxic respiratory failure.  He denies cough or shortness of breath.  He denies chest pain, headache, fever, nausea, vomiting, diarrhea or other concerns.         Past Medical History:  Diagnosis Date   Arthritis    per pt. I do not have md notes that indicate dx.   Diabetes mellitus without complication ([place])    Oropharynx cancer ([place]) 2009   squamous cell cancer  Stage T3, N2c, M0   Personal history of tobacco use, presenting hazards to health 10/08/2015    Patient Active Problem List   Diagnosis Date Noted   Protein-calorie malnutrition, severe 02/02/2021   Acute respiratory failure with hypoxia ([place]) 02/01/2021   Squamous cell carcinoma of oropharynx ([place]) 02/01/2021   Acute renal failure (ARF) ([place]) 02/01/2021   Acute left-sided weakness [id number]   Acute metabolic encephalopathy [id number]   Mixed diabetic hyperlipidemia associated with type 2 diabetes mellitus ([place]) 07/16/2020   Uncontrolled type 2 diabetes mellitus with hypoglycemia without coma, with long-term current use of insulin ([place]) 07/16/2020   COVID-19 virus infection 07/16/2020   GERD without esophagitis 07/16/2020   Bradycardia 07/16/2020   Severe sepsis ([place]) 09/16/2016   Personal history of tobacco use, presenting hazards to health 10/08/2015    Past Surgical History:   Procedure Laterality Date   [place]   pt states ruptured disc and had surgery to repair it    Prior to Admission medications   Medication Sig Start Date End Date Taking? Authorizing Provider  guaiFENesin 200 MG tablet Take 1 tablet (200 mg total) by mouth every 6 (six) hours as needed for cough or to loosen phlegm. 03/11/21  Yes Rena Hunke, [name], FNP  ACCU-CHEK AVIVA PLUS test strip  09/04/20   [provider]  acetaminophen (TYLENOL) 325 MG tablet Take 650 mg by mouth every 6 (six) hours as needed for headache or mild pain.    [provider]  albuterol (PROVENTIL HFA;VENTOLIN HFA) 108 (90 Base) MCG/ACT inhaler Inhale 2 puffs into the lungs every 6 (six) hours as needed for wheezing.    [provider]  atorvastatin (LIPITOR) 20 MG tablet Take 20 mg by mouth daily. 06/06/20   [provider]  benzonatate (TESSALON) 200 MG capsule Take 200 mg by mouth 3 (three) times daily as needed for cough.    [provider]  famotidine (PEPCID) 40 MG tablet Take 40 mg by mouth daily.    [provider]  lisinopril (ZESTRIL) 5 MG tablet Take 5 mg by mouth daily. 06/06/20   [provider]  omeprazole (PRILOSEC) 40 MG capsule Take 40 mg by mouth daily. 06/06/20   [provider]  TOUJEO SOLOSTAR 300 UNIT/ML Solostar Pen Inject 15 Units into the skin daily. 07/21/20   Terrilee Croak, MD    Allergies Patient has no known allergies.  Family History  Problem Relation Age of Onset  CVA Father    Prostate cancer Neg Hx        not sure what family member    Social History Social History   Tobacco Use   Smoking status: Former    Packs/day: 1.50    Years: 40.00    Pack years: 60.00    Types: Cigarettes    Quit date: 07/05/2006    Years since quitting: 14.6   Smokeless tobacco: Never  Substance Use Topics   Alcohol use: No    Comment: quit in 1991   Drug use: No    Review of Systems  Constitutional: No  fever/chills.  Positive for generalized weakness. Eyes: No visual changes. ENT: No sore throat. Cardiovascular: Denies chest pain. Respiratory: Denies shortness of breath. Gastrointestinal: No abdominal pain.  No nausea, no vomiting.  No diarrhea.  No constipation. Genitourinary: Negative for dysuria. Musculoskeletal: Negative for back pain. Skin: Negative for rash. Neurological: Negative for headaches, focal weakness or numbness. ____________________________________________   PHYSICAL EXAM:  VITAL SIGNS: ED Triage Vitals  Enc Vitals Group     BP 03/11/21 1156 105/73     Pulse Rate 03/11/21 1156 84     Resp 03/11/21 1156 18     Temp 03/11/21 1156 98.9 F (37.2 C)     Temp Source 03/11/21 1156 Oral     SpO2 03/11/21 1156 98 %     Weight 03/11/21 1153 155 lb (70.3 kg)     Height 03/11/21 1153 '5\' 3"'$  (1.6 m)     Head Circumference --      Peak Flow --      Pain Score 03/11/21 1153 0     Pain Loc --      Pain Edu? --      Excl. in [place]? --     Constitutional: Alert and oriented.  Chronically ill appearing and in no acute distress. Eyes: Conjunctivae are normal.  Head: Atraumatic.  Nose: No congestion/rhinnorhea. Mouth/Throat: Mucous membranes are moist. Oropharynx difficult to assess, visualized portion without abnormal finding. Neck: No stridor.   Hematological/Lymphatic/Immunilogical: No cervical lymphadenopathy. Cardiovascular: Normal rate, regular rhythm. Grossly normal heart sounds.  Good peripheral circulation. Respiratory: Normal respiratory effort.  No retractions. Lungs CTAB. Gastrointestinal: Soft and nontender. No distention. No abdominal bruits. Genitourinary:  Musculoskeletal: No lower extremity tenderness nor edema.  No joint effusions. Neurologic:   No gross focal neurologic deficits are appreciated. No gait instability. Skin:  Skin is warm, dry and intact. No rash noted. Psychiatric: Mood and affect are normal. Speech and behavior are  normal.  ____________________________________________   LABS (all labs ordered are listed, but only abnormal results are displayed)  Labs Reviewed  CBC - Abnormal; Notable for the following components:      Result Value   Hemoglobin 11.9 (*)    HCT 35.1 (*)    All other components within normal limits  DIFFERENTIAL - Abnormal; Notable for the following components:   Lymphs Abs 0.4 (*)    All other components within normal limits  COMPREHENSIVE METABOLIC PANEL - Abnormal; Notable for the following components:   Sodium 133 (*)    Chloride 97 (*)    BUN 25 (*)    Total Protein 8.4 (*)    All other components within normal limits  RESP PANEL BY RT-PCR (FLU A&B, COVID) ARPGX2  PROTIME-INR  APTT   ____________________________________________  EKG  ____________________________________________  RADIOLOGY  ED MD interpretation:    CT head without acute concerns.   I, Sherrie George, personally  viewed and evaluated these images (plain radiographs) as part of my medical decision making, as well as reviewing the written report by the radiologist.  Official radiology report(s): DG Chest 2 View  Result Date: 03/11/2021 CLINICAL DATA:  Weakness, recent pneumonia EXAM: CHEST - 2 VIEW COMPARISON:  02/16/2021 FINDINGS: The heart size and mediastinal contours are within normal limits. Both lungs are clear. The visualized skeletal structures are unremarkable. IMPRESSION: No acute abnormality of the lungs. Electronically Signed   By: Eddie Candle M.D.   On: 03/11/2021 13:16   CT HEAD WO CONTRAST  Result Date: 03/11/2021 CLINICAL DATA:  Transient ischemic attack.  Slurred speech. EXAM: CT HEAD WITHOUT CONTRAST TECHNIQUE: Contiguous axial images were obtained from the base of the skull through the vertex without intravenous contrast. COMPARISON:  07/16/2020 FINDINGS: Brain: There is no evidence for acute hemorrhage, hydrocephalus, mass lesion, or abnormal extra-axial fluid collection. No  definite CT evidence for acute infarction. Patchy low attenuation in the deep hemispheric and periventricular white matter is nonspecific, but likely reflects chronic microvascular ischemic demyelination. Vascular: No hyperdense vessel or unexpected calcification. Skull: No evidence for fracture. No worrisome lytic or sclerotic lesion. Sinuses/Orbits: The visualized paranasal sinuses and mastoid air cells are clear. Visualized portions of the globes and intraorbital fat are unremarkable. Other: None. IMPRESSION: 1. Stable.  No acute intracranial abnormality. 2. Chronic small vessel white matter ischemic disease. Electronically Signed   By: Misty Stanley M.D.   On: 03/11/2021 13:32    ____________________________________________   PROCEDURES  Procedure(s) performed (including Critical Care):  Procedures  ____________________________________________   INITIAL IMPRESSION / ASSESSMENT AND PLAN     70 year old male presenting to the emergency department for treatment and evaluation of generalized weakness.  See HPI for further details.  Plan will be to review labs drawn while awaiting ER room assignment. Will also review notes from previous ER visit.   DIFFERENTIAL DIAGNOSIS  Weakness, deconditioning, electrolyte imbalance ,CVA  ED COURSE  Work-up is reassuring.  Patient states that he would like to help remove the "cold" in his throat.  Will prescribe Mucinex.  Patient is to follow-up with primary care this week if possible.  He was advised to return to the emergency department for symptoms of change or worsen if unable to schedule appointment.    ___________________________________________   FINAL CLINICAL IMPRESSION(S) / ED DIAGNOSES  Final diagnoses:  Weakness     ED Discharge Orders          Ordered    guaiFENesin 200 MG tablet  Every 6 hours PRN        03/11/21 1506             Niyam Marg was evaluated in Emergency Department on 03/11/2021 for the symptoms  described in the history of present illness. He was evaluated in the context of the global COVID-19 pandemic, which necessitated consideration that the patient might be at risk for infection with the SARS-CoV-2 virus that causes COVID-19. Institutional protocols and algorithms that pertain to the evaluation of patients at risk for COVID-19 are in a state of rapid change based on information released by regulatory bodies including the CDC and federal and state organizations. These policies and algorithms were followed during the patient's care in the ED.   Note:  This document was prepared using Dragon voice recognition software and may include unintentional dictation errors.    Victorino Dike, FNP 03/11/21 1839    Lucrezia Starch, MD 03/12/21 [phone]

## 2021-03-11 NOTE — ED Triage Notes (Signed)
First Nurse Note:  Arrives from home via [facility] with c.o weakness x 1 week. Recent pneumonia.

## 2021-03-12 ENCOUNTER — Other Ambulatory Visit: Payer: Self-pay

## 2021-03-12 ENCOUNTER — Inpatient Hospital Stay
Admission: EM | Admit: 2021-03-12 | Discharge: 2021-03-21 | DRG: 871 | Disposition: A | Discharge status: Home Health | Payer: Medicare Other | Attending: Obstetrics and Gynecology | Admitting: Obstetrics and Gynecology

## 2021-03-12 ENCOUNTER — Encounter: Payer: Self-pay | Admitting: Emergency Medicine

## 2021-03-12 ENCOUNTER — Emergency Department: Payer: Medicare Other

## 2021-03-12 DIAGNOSIS — Z79899 Other long term (current) drug therapy: Secondary | ICD-10-CM | POA: Diagnosis not present

## 2021-03-12 DIAGNOSIS — E1165 Type 2 diabetes mellitus with hyperglycemia: Secondary | ICD-10-CM | POA: Diagnosis present

## 2021-03-12 DIAGNOSIS — A419 Sepsis, unspecified organism: Secondary | ICD-10-CM

## 2021-03-12 DIAGNOSIS — E871 Hypo-osmolality and hyponatremia: Secondary | ICD-10-CM | POA: Diagnosis present

## 2021-03-12 DIAGNOSIS — Z823 Family history of stroke: Secondary | ICD-10-CM

## 2021-03-12 DIAGNOSIS — E876 Hypokalemia: Secondary | ICD-10-CM | POA: Diagnosis present

## 2021-03-12 DIAGNOSIS — E11649 Type 2 diabetes mellitus with hypoglycemia without coma: Secondary | ICD-10-CM | POA: Diagnosis not present

## 2021-03-12 DIAGNOSIS — E162 Hypoglycemia, unspecified: Secondary | ICD-10-CM

## 2021-03-12 DIAGNOSIS — Z20822 Contact with and (suspected) exposure to covid-19: Secondary | ICD-10-CM | POA: Diagnosis present

## 2021-03-12 DIAGNOSIS — R1310 Dysphagia, unspecified: Secondary | ICD-10-CM

## 2021-03-12 DIAGNOSIS — Z515 Encounter for palliative care: Secondary | ICD-10-CM | POA: Diagnosis not present

## 2021-03-12 DIAGNOSIS — Z7189 Other specified counseling: Secondary | ICD-10-CM | POA: Diagnosis not present

## 2021-03-12 DIAGNOSIS — R64 Cachexia: Secondary | ICD-10-CM | POA: Diagnosis present

## 2021-03-12 DIAGNOSIS — J189 Pneumonia, unspecified organism: Secondary | ICD-10-CM

## 2021-03-12 DIAGNOSIS — Z87891 Personal history of nicotine dependence: Secondary | ICD-10-CM | POA: Diagnosis not present

## 2021-03-12 DIAGNOSIS — Z794 Long term (current) use of insulin: Secondary | ICD-10-CM

## 2021-03-12 DIAGNOSIS — R0902 Hypoxemia: Secondary | ICD-10-CM

## 2021-03-12 DIAGNOSIS — D6489 Other specified anemias: Secondary | ICD-10-CM | POA: Diagnosis present

## 2021-03-12 DIAGNOSIS — Z6821 Body mass index (BMI) 21.0-21.9, adult: Secondary | ICD-10-CM

## 2021-03-12 DIAGNOSIS — Z9221 Personal history of antineoplastic chemotherapy: Secondary | ICD-10-CM | POA: Diagnosis not present

## 2021-03-12 DIAGNOSIS — C109 Malignant neoplasm of oropharynx, unspecified: Secondary | ICD-10-CM | POA: Diagnosis present

## 2021-03-12 DIAGNOSIS — R1312 Dysphagia, oropharyngeal phase: Secondary | ICD-10-CM | POA: Diagnosis present

## 2021-03-12 DIAGNOSIS — E1169 Type 2 diabetes mellitus with other specified complication: Secondary | ICD-10-CM | POA: Diagnosis not present

## 2021-03-12 DIAGNOSIS — J9601 Acute respiratory failure with hypoxia: Secondary | ICD-10-CM

## 2021-03-12 DIAGNOSIS — D649 Anemia, unspecified: Secondary | ICD-10-CM | POA: Diagnosis not present

## 2021-03-12 DIAGNOSIS — J690 Pneumonitis due to inhalation of food and vomit: Secondary | ICD-10-CM | POA: Diagnosis not present

## 2021-03-12 DIAGNOSIS — Z923 Personal history of irradiation: Secondary | ICD-10-CM | POA: Diagnosis not present

## 2021-03-12 DIAGNOSIS — Z931 Gastrostomy status: Secondary | ICD-10-CM | POA: Diagnosis not present

## 2021-03-12 DIAGNOSIS — T17908A Unspecified foreign body in respiratory tract, part unspecified causing other injury, initial encounter: Secondary | ICD-10-CM

## 2021-03-12 DIAGNOSIS — Z85810 Personal history of malignant neoplasm of tongue: Secondary | ICD-10-CM

## 2021-03-12 DIAGNOSIS — E785 Hyperlipidemia, unspecified: Secondary | ICD-10-CM | POA: Diagnosis present

## 2021-03-12 DIAGNOSIS — R531 Weakness: Secondary | ICD-10-CM

## 2021-03-12 DIAGNOSIS — K219 Gastro-esophageal reflux disease without esophagitis: Secondary | ICD-10-CM | POA: Diagnosis present

## 2021-03-12 DIAGNOSIS — I10 Essential (primary) hypertension: Secondary | ICD-10-CM | POA: Diagnosis present

## 2021-03-12 LAB — COMPREHENSIVE METABOLIC PANEL
ALT: 14 U/L (ref 0–44)
AST: 28 U/L (ref 15–41)
Albumin: 3.5 g/dL (ref 3.5–5.0)
Alkaline Phosphatase: 81 U/L (ref 38–126)
Anion gap: 10 (ref 5–15)
BUN: 22 mg/dL (ref 8–23)
CO2: 25 mmol/L (ref 22–32)
Calcium: 9.2 mg/dL (ref 8.9–10.3)
Chloride: 98 mmol/L (ref 98–111)
Creatinine, Ser: 1.19 mg/dL (ref 0.61–1.24)
GFR, Estimated: >60 mL/min (ref >60)
Glucose, Bld: 51 mg/dL — ABNORMAL LOW (ref 70–99)
Potassium: 4 mmol/L (ref 3.5–5.1)
Sodium: 133 mmol/L — ABNORMAL LOW (ref 135–145)
Total Bilirubin: 0.9 mg/dL (ref 0.3–1.2)
Total Protein: 8.4 g/dL — ABNORMAL HIGH (ref 6.5–8.1)

## 2021-03-12 LAB — CBG MONITORING, ED
Glucose-Capillary: 26 mg/dL — CL (ref 70–99)
Glucose-Capillary: 29 mg/dL — CL (ref 70–99)
Glucose-Capillary: 33 mg/dL — CL (ref 70–99)
Glucose-Capillary: 39 mg/dL — CL (ref 70–99)
Glucose-Capillary: 251 mg/dL — ABNORMAL HIGH (ref 70–99)
Glucose-Capillary: 46 mg/dL — ABNORMAL LOW (ref 70–99)
Glucose-Capillary: 130 mg/dL — ABNORMAL HIGH (ref 70–99)
Glucose-Capillary: 179 mg/dL — ABNORMAL HIGH (ref 70–99)

## 2021-03-12 LAB — CBC WITH DIFFERENTIAL/PLATELET
Abs Immature Granulocytes: 0.02 10*3/uL (ref 0.00–0.07)
Basophils Absolute: 0 10*3/uL (ref 0.0–0.1)
Basophils Relative: 0 %
Eosinophils Absolute: 0 10*3/uL (ref 0.0–0.5)
Eosinophils Relative: 0 %
HCT: 37.8 % — ABNORMAL LOW (ref 39.0–52.0)
Hemoglobin: 12.4 g/dL — ABNORMAL LOW (ref 13.0–17.0)
Immature Granulocytes: 0 %
Lymphocytes Relative: 5 %
Lymphs Abs: 0.4 10*3/uL — ABNORMAL LOW (ref 0.7–4.0)
MCH: 26.6 pg (ref 26.0–34.0)
MCHC: 32.8 g/dL (ref 30.0–36.0)
MCV: 81.1 fL (ref 80.0–100.0)
Monocytes Absolute: 0.2 10*3/uL (ref 0.1–1.0)
Monocytes Relative: 3 %
Neutro Abs: 7.5 10*3/uL (ref 1.7–7.7)
Neutrophils Relative %: 92 %
Platelets: 304 10*3/uL (ref 150–400)
RBC: 4.66 MIL/uL (ref 4.22–5.81)
RDW: 14.6 % (ref 11.5–15.5)
WBC: 8.2 10*3/uL (ref 4.0–10.5)
nRBC: 0 % (ref 0.0–0.2)

## 2021-03-12 LAB — TROPONIN I (HIGH SENSITIVITY)
Troponin I (High Sensitivity): 21 ng/L — ABNORMAL HIGH (ref <18)
Troponin I (High Sensitivity): 24 ng/L — ABNORMAL HIGH (ref <18)

## 2021-03-12 LAB — PROCALCITONIN: Procalcitonin: 0.16 ng/mL

## 2021-03-12 LAB — APTT: aPTT: 40 seconds — ABNORMAL HIGH (ref 24–36)

## 2021-03-12 LAB — LACTIC ACID, PLASMA: Lactic Acid, Venous: 2.1 mmol/L (ref 0.5–1.9)

## 2021-03-12 LAB — RESP PANEL BY RT-PCR (FLU A&B, COVID) ARPGX2
Influenza A by PCR: NEGATIVE
Influenza B by PCR: NEGATIVE
SARS Coronavirus 2 by RT PCR: NEGATIVE

## 2021-03-12 LAB — PROTIME-INR
INR: 1.2 (ref 0.8–1.2)
Prothrombin Time: 14.8 seconds (ref 11.4–15.2)

## 2021-03-12 LAB — GLUCOSE, CAPILLARY: Glucose-Capillary: 204 mg/dL — ABNORMAL HIGH (ref 70–99)

## 2021-03-12 LAB — D-DIMER, QUANTITATIVE: D-Dimer, Quant: 2.23 ug/mL-FEU — ABNORMAL HIGH (ref 0.00–0.50)

## 2021-03-12 MED ORDER — LISINOPRIL 5 MG PO TABS
5.0000 mg | ORAL_TABLET | Freq: Every day | ORAL | Status: DC
  Administered 2021-03-12 – 2021-03-13 (×2): 5 mg via ORAL
  Filled 2021-03-12 (×4): qty 1

## 2021-03-12 MED ORDER — ACETAMINOPHEN 325 MG PO TABS
650.0000 mg | ORAL_TABLET | Freq: Four times a day (QID) | ORAL | Status: DC | PRN
Start: 2021-03-12 — End: 2021-03-18
  Administered 2021-03-18: 650 mg via ORAL
  Filled 2021-03-12: qty 2

## 2021-03-12 MED ORDER — SODIUM CHLORIDE 0.9% FLUSH
3.0000 mL | Freq: Two times a day (BID) | INTRAVENOUS | Status: DC
  Administered 2021-03-12 – 2021-03-18 (×6): 3 mL via INTRAVENOUS

## 2021-03-12 MED ORDER — ALBUTEROL SULFATE HFA 108 (90 BASE) MCG/ACT IN AERS: 2.0000 | INHALATION_SPRAY | Freq: Four times a day (QID) | RESPIRATORY_TRACT | Status: DC | PRN

## 2021-03-12 MED ORDER — ONDANSETRON HCL 4 MG/2ML IJ SOLN: 4.0000 mg | Freq: Four times a day (QID) | INTRAMUSCULAR | Status: DC | PRN

## 2021-03-12 MED ORDER — SODIUM CHLORIDE 0.9% FLUSH: 3.0000 mL | INTRAVENOUS | Status: DC | PRN

## 2021-03-12 MED ORDER — TRAZODONE HCL 50 MG PO TABS: 25.0000 mg | ORAL_TABLET | Freq: Every evening | ORAL | Status: DC | PRN

## 2021-03-12 MED ORDER — SODIUM CHLORIDE 0.9 % IV SOLN
500.0000 mg | INTRAVENOUS | Status: AC
Start: 2021-03-12 — End: 2021-03-16
  Administered 2021-03-13 – 2021-03-16 (×5): 500 mg via INTRAVENOUS
  Filled 2021-03-12 (×6): qty 500

## 2021-03-12 MED ORDER — SODIUM CHLORIDE 0.9 % IV SOLN: INTRAVENOUS | Status: DC

## 2021-03-12 MED ORDER — VANCOMYCIN HCL IN DEXTROSE 1-5 GM/200ML-% IV SOLN
1000.0000 mg | Freq: Once | INTRAVENOUS | Status: AC
  Administered 2021-03-12: 1000 mg via INTRAVENOUS
  Filled 2021-03-12: qty 200

## 2021-03-12 MED ORDER — IPRATROPIUM-ALBUTEROL 0.5-2.5 (3) MG/3ML IN SOLN
3.0000 mL | Freq: Four times a day (QID) | RESPIRATORY_TRACT | Status: DC
  Administered 2021-03-13: 3 mL via RESPIRATORY_TRACT
  Filled 2021-03-12: qty 3

## 2021-03-12 MED ORDER — ENOXAPARIN SODIUM 40 MG/0.4ML IJ SOSY
40.0000 mg | PREFILLED_SYRINGE | INTRAMUSCULAR | Status: DC
  Administered 2021-03-12 – 2021-03-16 (×5): 40 mg via SUBCUTANEOUS
  Filled 2021-03-12 (×4): qty 0.4

## 2021-03-12 MED ORDER — SODIUM CHLORIDE 0.9 % IV SOLN
2.0000 g | Freq: Once | INTRAVENOUS | Status: AC
  Administered 2021-03-12: 2 g via INTRAVENOUS
  Filled 2021-03-12: qty 2

## 2021-03-12 MED ORDER — FAMOTIDINE 20 MG PO TABS
40.0000 mg | ORAL_TABLET | Freq: Every day | ORAL | Status: DC
  Administered 2021-03-12 – 2021-03-13 (×2): 40 mg via ORAL
  Filled 2021-03-12 (×2): qty 2

## 2021-03-12 MED ORDER — ENSURE MAX PROTEIN PO LIQD
11.0000 [oz_av] | Freq: Every day | ORAL | Status: DC
  Filled 2021-03-12: qty 330

## 2021-03-12 MED ORDER — HYDROCOD POLST-CPM POLST ER 10-8 MG/5ML PO SUER: 5.0000 mL | Freq: Two times a day (BID) | ORAL | Status: DC | PRN

## 2021-03-12 MED ORDER — PANTOPRAZOLE SODIUM 40 MG PO TBEC
40.0000 mg | DELAYED_RELEASE_TABLET | Freq: Every day | ORAL | Status: DC
  Administered 2021-03-12 – 2021-03-13 (×2): 40 mg via ORAL
  Filled 2021-03-12 (×3): qty 1

## 2021-03-12 MED ORDER — SODIUM CHLORIDE 0.9 % IV SOLN: 250.0000 mL | INTRAVENOUS | Status: DC | PRN

## 2021-03-12 MED ORDER — IOHEXOL 350 MG/ML SOLN
75.0000 mL | Freq: Once | INTRAVENOUS | Status: AC | PRN
  Administered 2021-03-12: 75 mL via INTRAVENOUS

## 2021-03-12 MED ORDER — ACETAMINOPHEN 325 MG PO TABS: 650.0000 mg | ORAL_TABLET | Freq: Four times a day (QID) | ORAL | Status: DC | PRN

## 2021-03-12 MED ORDER — DEXTROSE IN LACTATED RINGERS 5 % IV SOLN: INTRAVENOUS | Status: DC

## 2021-03-12 MED ORDER — ALBUTEROL SULFATE (2.5 MG/3ML) 0.083% IN NEBU: 2.5000 mg | INHALATION_SOLUTION | Freq: Four times a day (QID) | RESPIRATORY_TRACT | Status: DC | PRN

## 2021-03-12 MED ORDER — ACETAMINOPHEN 650 MG RE SUPP: 650.0000 mg | Freq: Four times a day (QID) | RECTAL | Status: DC | PRN

## 2021-03-12 MED ORDER — DEXTROSE 50 % IV SOLN
INTRAVENOUS | Status: AC
  Filled 2021-03-12: qty 50

## 2021-03-12 MED ORDER — ATORVASTATIN CALCIUM 20 MG PO TABS
20.0000 mg | ORAL_TABLET | Freq: Every day | ORAL | Status: DC
  Administered 2021-03-12 – 2021-03-13 (×2): 20 mg via ORAL
  Filled 2021-03-12 (×4): qty 1

## 2021-03-12 MED ORDER — SODIUM CHLORIDE 0.9 % IV SOLN
3.0000 g | Freq: Four times a day (QID) | INTRAVENOUS | Status: DC
  Administered 2021-03-13 – 2021-03-18 (×19): 3 g via INTRAVENOUS
  Filled 2021-03-12 (×5): qty 8
  Filled 2021-03-12 (×2): qty 3
  Filled 2021-03-12 (×2): qty 8
  Filled 2021-03-12: qty 3
  Filled 2021-03-12 (×2): qty 8
  Filled 2021-03-12 (×2): qty 3
  Filled 2021-03-12 (×4): qty 8
  Filled 2021-03-12: qty 3
  Filled 2021-03-12: qty 8
  Filled 2021-03-12: qty 3
  Filled 2021-03-12 (×3): qty 8

## 2021-03-12 MED ORDER — DEXTROSE 50 % IV SOLN
1.0000 | Freq: Once | INTRAVENOUS | Status: AC
  Administered 2021-03-12: 50 mL via INTRAVENOUS

## 2021-03-12 MED ORDER — SODIUM CHLORIDE 0.9 % IV SOLN: 500.0000 mg | INTRAVENOUS | Status: DC

## 2021-03-12 MED ORDER — GUAIFENESIN ER 600 MG PO TB12
600.0000 mg | ORAL_TABLET | Freq: Two times a day (BID) | ORAL | Status: DC
  Administered 2021-03-12 – 2021-03-19 (×5): 600 mg via ORAL
  Filled 2021-03-12 (×8): qty 1

## 2021-03-12 MED ORDER — BENZONATATE 100 MG PO CAPS: 200.0000 mg | ORAL_CAPSULE | Freq: Three times a day (TID) | ORAL | Status: DC | PRN

## 2021-03-12 MED ORDER — ONDANSETRON HCL 4 MG PO TABS: 4.0000 mg | ORAL_TABLET | Freq: Four times a day (QID) | ORAL | Status: DC | PRN

## 2021-03-12 MED ORDER — MAGNESIUM HYDROXIDE 400 MG/5ML PO SUSP
30.0000 mL | Freq: Every day | ORAL | Status: DC | PRN
  Filled 2021-03-12: qty 30

## 2021-03-12 NOTE — Progress Notes (Signed)
OT Cancellation Note  Patient Details Name: Louis Stone MRN: [id number] DOB: September 15, 1950   Cancelled Treatment:    Reason Eval/Treat Not Completed: Medical issues which prohibited therapy. Order received and chart reviewed. Attempted x3 this PM. RN stated pt with low BGL and O2 desatting, nursing in room to address requesting to hold and reattempt at later time. Will hold and follow up as able to initiate services.   Dessie Coma, M.S. OTR/L  03/12/21, 2:43 PM  ascom [phone]

## 2021-03-12 NOTE — H&P (Signed)
[place]   PATIENT NAME: Louis Stone    MR#:  [id number]  DATE OF BIRTH:  Mar 01, 1951  DATE OF ADMISSION:  03/12/2021  PRIMARY CARE PHYSICIAN: Theotis Burrow, MD   Patient is coming from: Home  REQUESTING/REFERRING PHYSICIAN: Delman Kitten , MD  CHIEF COMPLAINT:   Chief Complaint  Patient presents with  . Weakness    HISTORY OF PRESENT ILLNESS:  Louis Stone is a 70 y.o. African-American male with medical history significant for osteoarthritis, type 2 diabetes mellitus, oropharyngeal stage IV squamous cell carcinoma, status post chemotherapy and radiotherapy and recent admission from 7/31 till 8/5 for acute hypoxic respiratory failure due to multifocal pneumonia as well as ED visits recently including 8/15, who presented to the emergency room with acute onset of generalized weakness.  The patient refused SNF placement on 8/15.  He is currently feeling so weak he can hardly stand up without help.  He admitted to cough productive of whitish sputum without wheezing however with mild dyspnea.  No recent falls or injuries.  He has mild headache without dizziness or blurred vision.  No chest pain or palpitations.  No nausea or vomiting or diarrhea or abdominal pain.  No dysuria, oliguria or hematuria or flank pain.  He is currently agreeable to go to rehab after his discharge.  ED Course: When he came to the ER vital signs were within normal and later respiratory rate was 22 and 29 and later heart rate was 101.  Pulse oximetry briefly dropped to the 80s but was later on within normal on room air.  Labs revealed mild hyponatremia and hyperglycemia 51 with a BUN of 22 and creatinine 1.19.  High-sensitivity troponin I was 21 with procalcitonin 0.16.  CBC showed mild anemia and D-dimer was 2.23.  Influenza antigens and COVID-19 PCR came back negative. EKG as reviewed by me : EKG showed normal sinus rhythm with rate of 73 with minimal voltage criteria for LVH and early  repolarization in V3 and V2. Imaging: Chest x-ray showed new patchy opacities in the lateral left base that may reflect infection or aspiration.  Chest CTA revealed multiple ill-defined opacities in both lung bases, left greater than right concerning for multifocal pneumonia with no evidence for PE and aspirated material noted in the trachea and right mainstem bronchus with aortic atherosclerosis.  The patient was given an amp of D50, IV vancomycin and Zosyn and 650 mg p.o. Tylenol.  He will be admitted to a medical monitored bed for further evaluation and management. PAST MEDICAL HISTORY:   Past Medical History:  Diagnosis Date  . Arthritis    per pt. I do not have md notes that indicate dx.  . Diabetes mellitus without complication ([place])   . Oropharynx cancer ([place]) 2009   squamous cell cancer  Stage T3, N2c, M0  . Personal history of tobacco use, presenting hazards to health 10/08/2015    PAST SURGICAL HISTORY:   Past Surgical History:  Procedure Laterality Date  . [place]   pt states ruptured disc and had surgery to repair it    SOCIAL HISTORY:   Social History   Tobacco Use  . Smoking status: Former    Packs/day: 1.50    Years: 40.00    Pack years: 60.00    Types: Cigarettes    Quit date: 07/05/2006    Years since quitting: 14.6  . Smokeless tobacco: Never  Substance Use Topics  . Alcohol use: No  Comment: quit in 1991    FAMILY HISTORY:   Family History  Problem Relation Age of Onset  . CVA Father   . Prostate cancer Neg Hx        not sure what family member    DRUG ALLERGIES:  No Known Allergies  REVIEW OF SYSTEMS:   ROS As per history of present illness. All pertinent systems were reviewed above. Constitutional, HEENT, cardiovascular, respiratory, GI, GU, musculoskeletal, neuro, psychiatric, endocrine, integumentary and hematologic systems were reviewed and are otherwise negative/unremarkable except for positive findings mentioned  above in the HPI.   MEDICATIONS AT HOME:   Prior to Admission medications   Medication Sig Start Date End Date Taking? Authorizing Provider  acetaminophen (TYLENOL) 325 MG tablet Take 650 mg by mouth every 6 (six) hours as needed for headache or mild pain.   Yes [provider]  albuterol (PROVENTIL HFA;VENTOLIN HFA) 108 (90 Base) MCG/ACT inhaler Inhale 2 puffs into the lungs every 6 (six) hours as needed for wheezing.   Yes [provider]  benzonatate (TESSALON) 200 MG capsule Take 200 mg by mouth 3 (three) times daily as needed for cough.   Yes [provider]  famotidine (PEPCID) 40 MG tablet Take 40 mg by mouth daily.   Yes [provider]  lisinopril (ZESTRIL) 5 MG tablet Take 5 mg by mouth daily. 06/06/20  Yes [provider]  omeprazole (PRILOSEC) 40 MG capsule Take 40 mg by mouth daily. 06/06/20  Yes [provider]  TOUJEO SOLOSTAR 300 UNIT/ML Solostar Pen Inject 15 Units into the skin daily. 07/21/20  Yes Dahal, Marlowe Aschoff, MD  ACCU-CHEK AVIVA PLUS test strip  09/04/20   [provider]  atorvastatin (LIPITOR) 20 MG tablet Take 20 mg by mouth daily. Patient not taking: Reported on 03/12/2021 06/06/20   [provider]  guaiFENesin 200 MG tablet Take 1 tablet (200 mg total) by mouth every 6 (six) hours as needed for cough or to loosen phlegm. 03/11/21   Triplett, Cari B, FNP      VITAL SIGNS:  Blood pressure 105/70, pulse 71, temperature 98.8 F (37.1 C), temperature source Oral, resp. rate 17, height '5\' 3"'$  (1.6 m), weight 70 kg, SpO2 95 %.  PHYSICAL EXAMINATION:  Physical Exam  GENERAL:  70 y.o.-year-old cachectic looking African-American male patient lying in the bed with no acute distress.  EYES: Pupils equal, round, reactive to light and accommodation. No scleral icterus. Extraocular muscles intact.  HEENT: Head atraumatic, normocephalic. Oropharynx and nasopharynx clear.  NECK:  Supple, no jugular venous  distention. No thyroid enlargement, no tenderness.  LUNGS: Diminished bibasilar breath sounds with bibasal crackles, left more than right.  CARDIOVASCULAR: Regular rate and rhythm, S1, S2 normal. No murmurs, rubs, or gallops.  ABDOMEN: Soft, nondistended, nontender. Bowel sounds present. No organomegaly or mass.  EXTREMITIES: No pedal edema, cyanosis, or clubbing.  NEUROLOGIC: Cranial nerves II through XII are intact. Muscle strength 5/5 in all extremities. Sensation intact. Gait not checked.  PSYCHIATRIC: The patient is alert and oriented x 3.  Normal affect and good eye contact. SKIN: No obvious rash, lesion, or ulcer.   LABORATORY PANEL:   CBC Recent Labs  Lab 03/12/21 1224  WBC 8.2  HGB 12.4*  HCT 37.8*  PLT 304   ------------------------------------------------------------------------------------------------------------------  Chemistries  Recent Labs  Lab 03/12/21 1224  NA 133*  K 4.0  CL 98  CO2 25  GLUCOSE 51*  BUN 22  CREATININE 1.19  CALCIUM 9.2  AST  28  ALT 14  ALKPHOS 81  BILITOT 0.9   ------------------------------------------------------------------------------------------------------------------  Cardiac Enzymes No results for input(s): TROPONINI in the last 168 hours. ------------------------------------------------------------------------------------------------------------------  RADIOLOGY:  DG Chest 2 View  Result Date: 03/11/2021 CLINICAL DATA:  Weakness, recent pneumonia EXAM: CHEST - 2 VIEW COMPARISON:  02/16/2021 FINDINGS: The heart size and mediastinal contours are within normal limits. Both lungs are clear. The visualized skeletal structures are unremarkable. IMPRESSION: No acute abnormality of the lungs. Electronically Signed   By: Eddie Candle M.D.   On: 03/11/2021 13:16   CT HEAD WO CONTRAST  Result Date: 03/11/2021 CLINICAL DATA:  Transient ischemic attack.  Slurred speech. EXAM: CT HEAD WITHOUT CONTRAST TECHNIQUE: Contiguous axial images  were obtained from the base of the skull through the vertex without intravenous contrast. COMPARISON:  07/16/2020 FINDINGS: Brain: There is no evidence for acute hemorrhage, hydrocephalus, mass lesion, or abnormal extra-axial fluid collection. No definite CT evidence for acute infarction. Patchy low attenuation in the deep hemispheric and periventricular white matter is nonspecific, but likely reflects chronic microvascular ischemic demyelination. Vascular: No hyperdense vessel or unexpected calcification. Skull: No evidence for fracture. No worrisome lytic or sclerotic lesion. Sinuses/Orbits: The visualized paranasal sinuses and mastoid air cells are clear. Visualized portions of the globes and intraorbital fat are unremarkable. Other: None. IMPRESSION: 1. Stable.  No acute intracranial abnormality. 2. Chronic small vessel white matter ischemic disease. Electronically Signed   By: Misty Stanley M.D.   On: 03/11/2021 13:32   CT Angio Chest PE W and/or Wo Contrast  Result Date: 03/12/2021 CLINICAL DATA:  Positive D-dimer. EXAM: CT ANGIOGRAPHY CHEST WITH CONTRAST TECHNIQUE: Multidetector CT imaging of the chest was performed using the standard protocol during bolus administration of intravenous contrast. Multiplanar CT image reconstructions and MIPs were obtained to evaluate the vascular anatomy. CONTRAST:  58m OMNIPAQUE IOHEXOL 350 MG/ML SOLN COMPARISON:  October 08, 2015. FINDINGS: Cardiovascular: Satisfactory opacification of the pulmonary arteries to the segmental level. No evidence of pulmonary embolism. Normal heart size. No pericardial effusion. Atherosclerosis of thoracic aorta is noted without aneurysm formation. Mediastinum/Nodes: No enlarged mediastinal, hilar, or axillary lymph nodes. Thyroid gland, trachea, and esophagus demonstrate no significant findings. Lungs/Pleura: No pneumothorax or pleural effusion is noted. Stable biapical scarring is noted. Aspirated material is noted in the trachea and right  mainstem bronchus. Multiple ill-defined opacities are noted in both lung bases, left greater than right, concerning for multifocal pneumonia. Upper Abdomen: No acute abnormality. Musculoskeletal: No chest wall abnormality. No acute or significant osseous findings. Review of the MIP images confirms the above findings. IMPRESSION: No definite evidence of pulmonary embolus. Multiple ill-defined opacities are noted in both lung bases, left greater than right, concerning for multifocal pneumonia. Aspirated material is noted in the trachea and right mainstem bronchus. Aortic Atherosclerosis (ICD10-I70.0). Electronically Signed   By: JMarijo ConceptionM.D.   On: 03/12/2021 17:55   DG Chest Port 1 View  Result Date: 03/12/2021 CLINICAL DATA:  Weakness EXAM: PORTABLE CHEST 1 VIEW COMPARISON:  Chest radiograph 03/11/2021 FINDINGS: The cardiomediastinal silhouette is stable. There are patchy opacities in the lateral left base which are increased since yesterday's study. There is no other focal airspace disease. There is no pleural effusion or pneumothorax. There is no acute osseous abnormality. IMPRESSION: New patchy opacities in the lateral left base may reflect infection or aspiration in the correct clinical setting. Electronically Signed   By: PValetta MoleM.D.   On: 03/12/2021 15:15      IMPRESSION  AND PLAN:  Active Problems:   Aspiration pneumonia ([place])  1.  Multifocal pneumonia concern for aspiration pneumonia.  The patient has subsequent sepsis as manifested by tachycardia and tachypnea.  The patient had brief hypoxia that resolved. - The patient be admitted to a medical monitored bed. - We will continue antibiotic therapy with IV Unasyn and Zithromax. - Mucolytic therapy will be provided. - Bronchodilator therapy will be provided as needed and scheduled. - We will follow sputum and blood cultures. - Speech therapy consult to be obtained. - The patient will be kept n.p.o. for now except for medications  only as tolerated.  2.   Hypoglycemia with type 2 diabetes mellitus. - We will place the patient on supplement coverage with NovoLog and stop basal coverage due to hypoglycemia.  3.  Dyslipidemia. - We will continue statin therapy.  4.  Essential hypertension. - We will continue Zestril.  5.  GERD. - We will continue PPI therapy.  6.  Stage IV oropharyngeal squamous cell carcinoma. - Oncology follow-up will be obtained as needed.   DVT prophylaxis: Lovenox. Code Status: full code. Family Communication:  The plan of care was discussed in details with the patient (and family). I answered all questions. The patient agreed to proceed with the above mentioned plan. Further management will depend upon hospital course. Disposition Plan: Back to previous home environment Consults called: none.  All the records are reviewed and case discussed with ED provider.  Status is: Inpatient  Remains inpatient appropriate because:Ongoing diagnostic testing needed not appropriate for outpatient work up, Unsafe d/c plan, IV treatments appropriate due to intensity of illness or inability to take PO, and Inpatient level of care appropriate due to severity of illness  Dispo: The patient is from: Home              Anticipated d/c is to: SNF              Patient currently is not medically stable to d/c.   Difficult to place patient No   TOTAL TIME TAKING CARE OF THIS PATIENT: 55 minutes.    Christel Mormon M.D on 03/12/2021 at 7:32 PM  [facility]   From 7 PM-7 AM, contact night-coverage [email or web address]  CC: Primary care physician; Revelo, Elyse Jarvis, MD

## 2021-03-12 NOTE — ED Notes (Signed)
Gave pt 4oz apple juice for low BG, notified MD.

## 2021-03-12 NOTE — ED Notes (Signed)
Informed RN bed assigned 

## 2021-03-12 NOTE — ED Provider Notes (Signed)
CT Angio Chest PE W and/or Wo Contrast  Result Date: 03/12/2021 CLINICAL DATA:  Positive D-dimer. EXAM: CT ANGIOGRAPHY CHEST WITH CONTRAST TECHNIQUE: Multidetector CT imaging of the chest was performed using the standard protocol during bolus administration of intravenous contrast. Multiplanar CT image reconstructions and MIPs were obtained to evaluate the vascular anatomy. CONTRAST:  74m OMNIPAQUE IOHEXOL 350 MG/ML SOLN COMPARISON:  October 08, 2015. FINDINGS: Cardiovascular: Satisfactory opacification of the pulmonary arteries to the segmental level. No evidence of pulmonary embolism. Normal heart size. No pericardial effusion. Atherosclerosis of thoracic aorta is noted without aneurysm formation. Mediastinum/Nodes: No enlarged mediastinal, hilar, or axillary lymph nodes. Thyroid gland, trachea, and esophagus demonstrate no significant findings. Lungs/Pleura: No pneumothorax or pleural effusion is noted. Stable biapical scarring is noted. Aspirated material is noted in the trachea and right mainstem bronchus. Multiple ill-defined opacities are noted in both lung bases, left greater than right, concerning for multifocal pneumonia. Upper Abdomen: No acute abnormality. Musculoskeletal: No chest wall abnormality. No acute or significant osseous findings. Review of the MIP images confirms the above findings. IMPRESSION: No definite evidence of pulmonary embolus. Multiple ill-defined opacities are noted in both lung bases, left greater than right, concerning for multifocal pneumonia. Aspirated material is noted in the trachea and right mainstem bronchus. Aortic Atherosclerosis (ICD10-I70.0). Electronically Signed   By: JMarijo ConceptionM.D.   On: 03/12/2021 17:55   DG Chest Port 1 View  Result Date: 03/12/2021 CLINICAL DATA:  Weakness EXAM: PORTABLE CHEST 1 VIEW COMPARISON:  Chest radiograph 03/11/2021 FINDINGS: The cardiomediastinal silhouette is stable. There are patchy opacities in the lateral left base which are  increased since yesterday's study. There is no other focal airspace disease. There is no pleural effusion or pneumothorax. There is no acute osseous abnormality. IMPRESSION: New patchy opacities in the lateral left base may reflect infection or aspiration in the correct clinical setting. Electronically Signed   By: PValetta MoleM.D.   On: 03/12/2021 15:15     CT angiography reviewed negative for PE.  However multiple opacities are noted in the lung bases.  Possible multifocal pneumonia, also concern for aspirated material  Having reviewed the patient's CT scan with concern for possible aspirated material in the bronchus and trachea I have changed his diet order to n.p.o. at this time.  Patient is resting comfortably actually on room air at this point.  Broad-spectrum antibiotic orders the patient was recently admitted and treated for pneumonia less than 2 months ago in the hospital.  Admission discussed with accepted by Dr. MSidney Ace Patient understand agreeable with plan.  Currently resting comfortably without distress, appropriate for admit   QDelman Kitten MD 03/12/21 1918

## 2021-03-12 NOTE — Consult Note (Signed)
PHARMACY -  BRIEF ANTIBIOTIC NOTE   Pharmacy has received consult(s) for cefepime and vancomycin from an ED provider.  The patient's profile has been reviewed for ht/wt/allergies/indication/available labs.    One time order(s) placed for  Cefepime 2 gram Vancomycin 1000 mg   Further antibiotics/pharmacy consults should be ordered by admitting physician if indicated.                       Thank you, Dorothe Pea, PharmD, BCPS Clinical Pharmacist   03/12/2021  6:55 PM

## 2021-03-12 NOTE — ED Provider Notes (Signed)
[facility] Emergency Department Provider Note  ____________________________________________   Event Date/Time   First MD Initiated Contact with Patient 03/12/21 1221     (approximate)  I have reviewed the triage vital signs and the nursing notes.   HISTORY  Chief Complaint Weakness   HPI Louis Stone is a 70 y.o. 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  It seems that when patient was discharged they recommended SNF due to overall deconditioning weakness the patient had refused this at that time.  He states that he has been so weak that he can hardly stand up without help.  He denies any recent falls or injuries, headache, earache, sore throat, shortness of breath, chest pain, [place] pain, vomiting, diarrhea, dysuria,, rash or other acute concerns other than just feeling very weak.  It seems this has been getting worse over last couple weeks and is not significantly different today than it was yesterday when he was evaluated.  States that today he is agreeable to going to rehab.         Past Medical History:  Diagnosis Date   Arthritis    per pt. I do not have md notes that indicate dx.   Diabetes mellitus without complication ([place])    Oropharynx cancer ([place]) 2009   squamous cell cancer  Stage T3, N2c, M0   Personal history of tobacco use, presenting hazards to health 10/08/2015    Patient Active Problem List   Diagnosis Date Noted   Protein-calorie malnutrition, severe 02/02/2021   Acute respiratory failure with hypoxia ([place]) 02/01/2021   Squamous cell carcinoma of oropharynx  ([place]) 02/01/2021   Acute renal failure (ARF) ([place]) 02/01/2021   Acute left-sided weakness [id number]   Acute metabolic encephalopathy [id number]   Mixed diabetic hyperlipidemia associated with type 2 diabetes mellitus ([place]) 07/16/2020   Uncontrolled type 2 diabetes mellitus with hypoglycemia without coma, with long-term current use of insulin ([place]) 07/16/2020   COVID-19 virus infection 07/16/2020   GERD without esophagitis 07/16/2020   Bradycardia 07/16/2020   Severe sepsis ([place]) 09/16/2016   Personal history of tobacco use, presenting hazards to health 10/08/2015    Past Surgical History:  Procedure Laterality Date   [place]   pt states ruptured disc and had surgery to repair it    Prior to Admission medications   Medication Sig Start Date End Date Taking? Authorizing Provider  acetaminophen (TYLENOL) 325 MG tablet Take 650 mg by mouth every 6 (six) hours as needed for headache or mild pain.   Yes [provider]  albuterol (PROVENTIL HFA;VENTOLIN HFA) 108 (90 Base) MCG/ACT inhaler Inhale 2 puffs into the lungs every 6 (six) hours as needed for wheezing.   Yes [provider]  benzonatate (TESSALON) 200 MG capsule Take 200 mg by mouth 3 (three) times daily as needed for cough.   Yes [provider]  famotidine (PEPCID) 40 MG tablet Take 40 mg by mouth daily.   Yes [provider]  lisinopril (ZESTRIL) 5 MG tablet Take 5 mg by mouth daily. 06/06/20  Yes [provider]  omeprazole (PRILOSEC) 40 MG capsule Take 40 mg by mouth daily. 06/06/20  Yes [provider]  TOUJEO SOLOSTAR 300 UNIT/ML Solostar Pen Inject 15 Units into the skin daily. 07/21/20  Yes Dahal, Marlowe Aschoff, MD  ACCU-CHEK AVIVA PLUS test strip  09/04/20   [provider]  atorvastatin (LIPITOR) 20 MG tablet Take 20 mg by mouth daily. Patient not taking: Reported on 03/12/2021 06/06/20   [provider]  guaiFENesin 200 MG tablet Take 1 tablet  (200 mg total) by mouth every 6 (six) hours as needed for cough or to loosen phlegm. 03/11/21   Victorino Dike, FNP    Allergies Patient has no known allergies.  Family History  Problem Relation Age of Onset   CVA Father    Prostate cancer Neg Hx        not sure what family member    Social History Social History   Tobacco Use   Smoking status: Former    Packs/day: 1.50    Years: 40.00    Pack years: 60.00    Types: Cigarettes    Quit date: 07/05/2006    Years since quitting: 14.6   Smokeless tobacco: Never  Substance Use Topics   Alcohol use: No    Comment: quit in 1991   Drug use: No    Review of Systems  Review of Systems  Constitutional:  Negative for chills and fever.  HENT:  Negative for sore throat.   Eyes:  Negative for pain.  Respiratory:  Negative for cough and stridor.   Cardiovascular:  Negative for chest pain.  Gastrointestinal:  Negative for vomiting.  Genitourinary:  Negative for dysuria.  Musculoskeletal:  Negative for myalgias.  Skin:  Negative for rash.  Neurological:  Positive for weakness. Negative for seizures, loss of consciousness and headaches.  Psychiatric/Behavioral:  Negative for suicidal ideas.   All other systems reviewed and are negative.    ____________________________________________   PHYSICAL EXAM:  VITAL SIGNS: ED Triage Vitals  Enc Vitals Group     BP      Pulse      Resp      Temp      Temp src      SpO2      Weight      Height      Head Circumference      Peak Flow      Pain Score      Pain Loc      Pain Edu?      Excl. in [place]?    Vitals:   03/12/21 1456 03/12/21 1500  BP: 116/79 130/80  Pulse: 82 82  Resp: 14 20  Temp:    SpO2: 92% 94%   Physical Exam Vitals and nursing note reviewed.  Constitutional:      Appearance: He is well-developed.  HENT:     Head: Normocephalic and atraumatic.     Right Ear: External ear normal.     Left Ear: External ear normal.     Nose: Nose normal.      Mouth/Throat:     Mouth: Mucous membranes are dry.  Eyes:     Conjunctiva/sclera: Conjunctivae normal.  Cardiovascular:     Rate and Rhythm: Normal rate and regular rhythm.     Heart sounds: No murmur heard. Pulmonary:     Effort: Pulmonary effort is normal. No respiratory distress.     Breath sounds: Normal breath sounds.  Abdominal:     Palpations: Abdomen is soft.     Tenderness: There is no abdominal tenderness.  Musculoskeletal:     Cervical back: Neck  supple.  Skin:    General: Skin is warm and dry.     Capillary Refill: Capillary refill takes 2 to 3 seconds.  Neurological:     Mental Status: He is alert.  Psychiatric:        Mood and Affect: Mood normal.    Cranial nerves II through XII grossly intact.  Patient is symmetric strength in his upper and lower extremities.  2+ radial pulses.  Sensation intact light touch all extremities.  Lungs are clear bilaterally. ____________________________________________   LABS (all labs ordered are listed, but only abnormal results are displayed)  Labs Reviewed  CBC WITH DIFFERENTIAL/PLATELET - Abnormal; Notable for the following components:      Result Value   Hemoglobin 12.4 (*)    HCT 37.8 (*)    Lymphs Abs 0.4 (*)    All other components within normal limits  COMPREHENSIVE METABOLIC PANEL - Abnormal; Notable for the following components:   Sodium 133 (*)    Glucose, Bld 51 (*)    Total Protein 8.4 (*)    All other components within normal limits  CBG MONITORING, ED - Abnormal; Notable for the following components:   Glucose-Capillary 39 (*)    All other components within normal limits  CBG MONITORING, ED - Abnormal; Notable for the following components:   Glucose-Capillary 29 (*)    All other components within normal limits  CBG MONITORING, ED - Abnormal; Notable for the following components:   Glucose-Capillary 26 (*)    All other components within normal limits  CBG MONITORING, ED - Abnormal; Notable for the following  components:   Glucose-Capillary 33 (*)    All other components within normal limits  CBG MONITORING, ED - Abnormal; Notable for the following components:   Glucose-Capillary 46 (*)    All other components within normal limits  CBG MONITORING, ED - Abnormal; Notable for the following components:   Glucose-Capillary 251 (*)    All other components within normal limits  RESP PANEL BY RT-PCR (FLU A&B, COVID) [id number]  D-DIMER, QUANTITATIVE  TROPONIN I (HIGH SENSITIVITY)  TROPONIN I (HIGH SENSITIVITY)   ____________________________________________  EKG  ECG today shows sinus rhythm with a ventricular rate of 73, normal axis, unremarkable intervals without clearance of acute ischemia or significant arrhythmia. ____________________________________________  RADIOLOGY  ED MD interpretation: X-ray and CT head obtained yesterday were reviewed by myself today.  Chest x-ray had no effusion, edema, pneumothorax or other acute thoracic process.  CT head with some stable chronic small vessel ischemic disease but no acute intracranial process.   Official radiology report(s): DG Chest Port 1 View  Result Date: 03/12/2021 CLINICAL DATA:  Weakness EXAM: PORTABLE CHEST 1 VIEW COMPARISON:  Chest radiograph 03/11/2021 FINDINGS: The cardiomediastinal silhouette is stable. There are patchy opacities in the lateral left base which are increased since yesterday's study. There is no other focal airspace disease. There is no pleural effusion or pneumothorax. There is no acute osseous abnormality. IMPRESSION: New patchy opacities in the lateral left base may reflect infection or aspiration in the correct clinical setting. Electronically Signed   By: Valetta Mole M.D.   On: 03/12/2021 15:15    ____________________________________________   PROCEDURES  Procedure(s) performed (including Critical Care):  .Critical Care Performed by: Lucrezia Starch, MD Authorized by: Lucrezia Starch, MD   Critical care  provider statement:    Critical care time (minutes):  45   Critical care was necessary to treat or prevent imminent or life-threatening deterioration of the  following conditions:  Respiratory failure and metabolic crisis   Critical care was time spent personally by me on the following activities:  Discussions with consultants, evaluation of patient's response to treatment, examination of patient, ordering and performing treatments and interventions, ordering and review of laboratory studies, ordering and review of radiographic studies, pulse oximetry, re-evaluation of patient's condition, obtaining history from patient or surrogate and review of old charts   ____________________________________________   INITIAL IMPRESSION / [place] / ED COURSE     Patient presents with above to history exam for some persistent weakness after being recently evaluated in ED for same yesterday.  Symptoms been ongoing since his recent discharge noted above and patient states he was Emergency room now more amenable to going to rehab.  He denies any other acute complaints on arrival.  He has a nonfocal neuro exam.  He does note he has had very poor p.o. intake over the last couple days.  X-ray and CT head obtained yesterday were reviewed by myself today.  Chest x-ray had no effusion, edema, pneumothorax or other acute thoracic process.  CT head with some stable chronic small vessel ischemic disease but no acute intracranial process.  CMP today shows chronic hyponatremia and hyperglycemia with a glucose of 51.  CBC shows no leukocytosis or acute anemia.  While undergoing initial work-up patient was given some juice and food to eat however despite eating he was noted to be persistently hyperglycemic with decreasing blood sugars and fingerstick glucose down to 26.  He was placed on D5 LR infusion given a D50 bolus With improvement back to 251.  During this time his SPO2 was noted to have decreased with a  good waveform to the high 80s.  He is again denying any chest pain shortness of breath cough or other acute sick symptoms.  I will obtain a chest x-ray, D-dimer and EKG and troponin.  Patient will require admission for respiratory failure and hyperglycemia.  Care patient signed over to oncoming fat approximately 1500.  Plan is to follow-up chest x-ray, D-dimer, troponin and admit. ____________________________________________   FINAL CLINICAL IMPRESSION(S) / ED DIAGNOSES  Final diagnoses:  Weakness  Hypoglycemia  Acute respiratory failure with hypoxia (HCC)    Medications  atorvastatin (LIPITOR) tablet 20 mg (20 mg Oral Given 03/12/21 1413)  lisinopril (ZESTRIL) tablet 5 mg (5 mg Oral Given 03/12/21 1413)  pantoprazole (PROTONIX) EC tablet 40 mg (40 mg Oral Given 03/12/21 1414)  acetaminophen (TYLENOL) tablet 650 mg (has no administration in time range)  sodium chloride flush (NS) 0.9 % injection 3 mL (has no administration in time range)  sodium chloride flush (NS) 0.9 % injection 3 mL (has no administration in time range)  0.9 %  sodium chloride infusion (has no administration in time range)  dextrose 5 % in lactated ringers infusion (has no administration in time range)  dextrose 50 % solution 50 mL (50 mLs Intravenous Given 03/12/21 1438)     ED Discharge Orders     None        Note:  This document was prepared using Dragon voice recognition software and may include unintentional dictation errors.    Lucrezia Starch, MD 03/12/21 [id number]

## 2021-03-12 NOTE — Progress Notes (Signed)
PT Cancellation Note  Patient Details Name: Denzil Palko MRN: [id number] DOB: 1950-11-18   Cancelled Treatment:    Reason Eval/Treat Not Completed: Medical issues which prohibited therapy. Patient with low blood glucose: 26, 33, 46 taken this afternoon. PT will hold and follow up as appropriate.   Minna Merritts, PT, MPT   Percell Locus 03/12/2021, 2:46 PM

## 2021-03-12 NOTE — ED Notes (Signed)
Gave pt sandwhich tray, Pt difficult to understand d/t HX of throat cancer.

## 2021-03-12 NOTE — ED Notes (Signed)
Removed [place] to trial pt

## 2021-03-12 NOTE — ED Notes (Signed)
Pt given dinner tray.

## 2021-03-12 NOTE — Consult Note (Signed)
CODE SEPSIS - PHARMACY COMMUNICATION  **Broad Spectrum Antibiotics should be administered within 1 hour of Sepsis diagnosis**  Time Code Sepsis Called/Page Received: 1847  Antibiotics Ordered: 1847  Time of 1st antibiotic administration: 1927      Dorothe Pea ,PharmD, BCPS Clinical Pharmacist  03/12/2021  6:55 PM

## 2021-03-12 NOTE — ED Notes (Signed)
O2 sats 87-91, MD notified, [place] applied at 2L.

## 2021-03-12 NOTE — ED Notes (Signed)
ED TO INPATIENT HANDOFF REPORT  ED Nurse Name and Phone #:  Fabio Neighbors RN [phone]  S Name/Age/Gender Louis Stone 70 y.o. male Room/Bed: ED11A/ED11A  Code Status   Code Status: Full Code  Home/SNF/Other Home Patient oriented to: self, place, time, and situation Is this baseline? Yes   Triage Complete: Triage complete  Chief Complaint Aspiration pneumonia ([place]) [J69.0]  Triage Note Pt arrives via [facility], reports generalized weakness progressive in the past month. Pt lives at home and called [facility] reporting that he could not get up.  [facility] helped pt to ambulate.  Pt was able to ambulate but very shaky and unstable per EMS. Hx of throat cancer reported. Voice Is hard to understand per EMS.   Allergies No Known Allergies  Level of Care/Admitting Diagnosis ED Disposition     ED Disposition  Admit   Condition  --   Comment  Hospital Area: [facility] [[id number]]  Level of Care: Med-Surg [16]  Covid Evaluation: Asymptomatic Screening Protocol (No Symptoms)  Diagnosis: Aspiration pneumonia [facility]) [id number]  Admitting Physician: Christel Mormon [id number]  Attending Physician: Christel Mormon [id number]  Estimated length of stay: past midnight tomorrow  Certification:: I certify this patient will need inpatient services for at least 2 midnights          B Medical/Surgery History Past Medical History:  Diagnosis Date   Arthritis    per pt. I do not have md notes that indicate dx.   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, M0   Personal history of tobacco use, presenting hazards to health 10/08/2015   Past Surgical History:  Procedure Laterality Date   [place]   pt states ruptured disc and had surgery to repair it     A IV Location/Drains/Wounds Patient Lines/Drains/Airways Status     Active Line/Drains/Airways     Name Placement date Placement  time Site Days   Peripheral IV 03/12/21 20 G Anterior;Right Forearm 03/12/21  1230  Forearm  less than 1            Intake/Output Last 24 hours No intake or output data in the 24 hours ending 03/12/21 1939  Labs/Imaging Results for orders placed or performed during the hospital encounter of 03/12/21 (from the past 48 hour(s))  CBC with Differential     Status: Abnormal   Collection Time: 03/12/21 12:24 PM  Result Value Ref Range   WBC 8.2 4.0 - 10.5 K/uL   RBC 4.66 4.22 - 5.81 MIL/uL   Hemoglobin 12.4 (L) 13.0 - 17.0 g/dL   HCT 37.8 (L) 39.0 - 52.0 %   MCV 81.1 80.0 - 100.0 fL   MCH 26.6 26.0 - 34.0 pg   MCHC 32.8 30.0 - 36.0 g/dL   RDW 14.6 11.5 - 15.5 %   Platelets 304 150 - 400 K/uL   nRBC 0.0 0.0 - 0.2 %   Neutrophils Relative % 92 %   Neutro Abs 7.5 1.7 - 7.7 K/uL   Lymphocytes Relative 5 %   Lymphs Abs 0.4 (L) 0.7 - 4.0 K/uL   Monocytes Relative 3 %   Monocytes Absolute 0.2 0.1 - 1.0 K/uL   Eosinophils Relative 0 %   Eosinophils Absolute 0.0 0.0 - 0.5 K/uL   Basophils Relative 0 %   Basophils Absolute 0.0 0.0 - 0.1 K/uL   Immature Granulocytes 0 %   Abs Immature  Granulocytes 0.02 0.00 - 0.07 K/uL    Comment: Performed at [facility], [address] [place], [place] [address]  Comprehensive metabolic panel     Status: Abnormal   Collection Time: 03/12/21 12:24 PM  Result Value Ref Range   Sodium 133 (L) 135 - 145 mmol/L   Potassium 4.0 3.5 - 5.1 mmol/L   Chloride 98 98 - 111 mmol/L   CO2 25 22 - 32 mmol/L   Glucose, Bld 51 (L) 70 - 99 mg/dL    Comment: Glucose reference range applies only to samples taken after fasting for at least 8 hours.   BUN 22 8 - 23 mg/dL   Creatinine, Ser 1.19 0.61 - 1.24 mg/dL   Calcium 9.2 8.9 - 10.3 mg/dL   Total Protein 8.4 (H) 6.5 - 8.1 g/dL   Albumin 3.5 3.5 - 5.0 g/dL   AST 28 15 - 41 U/L   ALT 14 0 - 44 U/L   Alkaline Phosphatase 81 38 - 126 U/L   Total Bilirubin 0.9 0.3 - 1.2 mg/dL   GFR, Estimated >60 >60  mL/min    Comment: (NOTE) Calculated using the CKD-EPI Creatinine Equation (2021)    Anion gap 10 5 - 15    Comment: Performed at [facility], [address], [place], [place] [address]  CBG monitoring, ED (now and then every hour for 3 hours)     Status: Abnormal   Collection Time: 03/12/21  2:07 PM  Result Value Ref Range   Glucose-Capillary 39 (LL) 70 - 99 mg/dL    Comment: Glucose reference range applies only to samples taken after fasting for at least 8 hours.   Comment 1 Repeat Test   CBG monitoring, ED (now and then every hour for 3 hours)     Status: Abnormal   Collection Time: 03/12/21  2:08 PM  Result Value Ref Range   Glucose-Capillary 29 (LL) 70 - 99 mg/dL    Comment: Glucose reference range applies only to samples taken after fasting for at least 8 hours.   Comment 1 Call MD NNP PA CNM   CBG monitoring, ED (now and then every hour for 3 hours)     Status: Abnormal   Collection Time: 03/12/21  2:26 PM  Result Value Ref Range   Glucose-Capillary 26 (LL) 70 - 99 mg/dL    Comment: Glucose reference range applies only to samples taken after fasting for at least 8 hours.   Comment 1 Repeat Test   CBG monitoring, ED     Status: Abnormal   Collection Time: 03/12/21  2:28 PM  Result Value Ref Range   Glucose-Capillary 33 (LL) 70 - 99 mg/dL    Comment: Glucose reference range applies only to samples taken after fasting for at least 8 hours.   Comment 1 Call MD NNP PA CNM   CBG monitoring, ED     Status: Abnormal   Collection Time: 03/12/21  2:37 PM  Result Value Ref Range   Glucose-Capillary 46 (L) 70 - 99 mg/dL    Comment: Glucose reference range applies only to samples taken after fasting for at least 8 hours.  CBG monitoring, ED     Status: Abnormal   Collection Time: 03/12/21  2:58 PM  Result Value Ref Range   Glucose-Capillary 251 (H) 70 - 99 mg/dL    Comment: Glucose reference range applies only to samples taken after fasting for at least 8 hours.  Resp  Panel by RT-PCR (Flu A&B,  Covid) Nasopharyngeal Swab     Status: None   Collection Time: 03/12/21  3:00 PM   Specimen: Nasopharyngeal Swab; Nasopharyngeal(NP) swabs in vial transport medium  Result Value Ref Range   SARS Coronavirus 2 by RT PCR NEGATIVE NEGATIVE    Comment: (NOTE) SARS-CoV-2 target nucleic acids are NOT DETECTED.  The SARS-CoV-2 RNA is generally detectable in upper respiratory specimens during the acute phase of infection. The lowest concentration of SARS-CoV-2 viral copies this assay can detect is 138 copies/mL. A negative result does not preclude SARS-Cov-2 infection and should not be used as the sole basis for treatment or other patient management decisions. A negative result may occur with  improper specimen collection/handling, submission of specimen other than nasopharyngeal swab, presence of viral mutation(s) within the areas targeted by this assay, and inadequate number of viral copies(<138 copies/mL). A negative result must be combined with clinical observations, patient history, and epidemiological information. The expected result is Negative.  Fact Sheet for Patients:  EntrepreneurPulse.com.au  Fact Sheet for Healthcare Providers:  IncredibleEmployment.be  This test is no t yet approved or cleared by the [place] FDA and  has been authorized for detection and/or diagnosis of SARS-CoV-2 by FDA under an Emergency Use Authorization (EUA). This EUA will remain  in effect (meaning this test can be used) for the duration of the COVID-19 declaration under Section 564(b)(1) of the Act, 21 U.S.C.section 360bbb-3(b)(1), unless the authorization is terminated  or revoked sooner.       Influenza A by PCR NEGATIVE NEGATIVE   Influenza B by PCR NEGATIVE NEGATIVE    Comment: (NOTE) The Xpert Xpress SARS-CoV-2/FLU/RSV plus assay is intended as an aid in the diagnosis of influenza from Nasopharyngeal swab specimens  and should not be used as a sole basis for treatment. Nasal washings and aspirates are unacceptable for Xpert Xpress SARS-CoV-2/FLU/RSV testing.  Fact Sheet for Patients: EntrepreneurPulse.com.au  Fact Sheet for Healthcare Providers: IncredibleEmployment.be  This test is not yet approved or cleared by the [place] FDA and has been authorized for detection and/or diagnosis of SARS-CoV-2 by FDA under an Emergency Use Authorization (EUA). This EUA will remain in effect (meaning this test can be used) for the duration of the COVID-19 declaration under Section 564(b)(1) of the Act, 21 U.S.C. section 360bbb-3(b)(1), unless the authorization is terminated or revoked.  Performed at [facility], [place], [place] [address]   Troponin I (High Sensitivity)     Status: Abnormal   Collection Time: 03/12/21  3:00 PM  Result Value Ref Range   Troponin I (High Sensitivity) 21 (H) <18 ng/L    Comment: (NOTE) Elevated high sensitivity troponin I (hsTnI) values and significant  changes across serial measurements may suggest ACS but many other  chronic and acute conditions are known to elevate hsTnI results.  Refer to the "Links" section for chest pain algorithms and additional  guidance. Performed at [facility], [address] [place], [place] [address]   D-dimer, quantitative     Status: Abnormal   Collection Time: 03/12/21  3:00 PM  Result Value Ref Range   D-Dimer, Quant 2.23 (H) 0.00 - 0.50 ug/mL-FEU    Comment: (NOTE) At the manufacturer cut-off value of 0.5 g/mL FEU, this assay has a negative predictive value of 95-100%.This assay is intended for use in conjunction with a clinical pretest probability (PTP) assessment model to exclude pulmonary embolism (PE) and deep venous thrombosis (DVT) in outpatients suspected of PE or DVT. Results should  be correlated with clinical presentation. Performed at [facility], [address] [place], [place] [address]   CBG monitoring, ED     Status: Abnormal   Collection Time: 03/12/21  5:16 PM  Result Value Ref Range   Glucose-Capillary 130 (H) 70 - 99 mg/dL    Comment: Glucose reference range applies only to samples taken after fasting for at least 8 hours.  CBG monitoring, ED     Status: Abnormal   Collection Time: 03/12/21  7:26 PM  Result Value Ref Range   Glucose-Capillary 179 (H) 70 - 99 mg/dL    Comment: Glucose reference range applies only to samples taken after fasting for at least 8 hours.   Comment 1 Notify RN    Comment 2 Document in Chart    DG Chest 2 View  Result Date: 03/11/2021 CLINICAL DATA:  Weakness, recent pneumonia EXAM: CHEST - 2 VIEW COMPARISON:  02/16/2021 FINDINGS: The heart size and mediastinal contours are within normal limits. Both lungs are clear. The visualized skeletal structures are unremarkable. IMPRESSION: No acute abnormality of the lungs. Electronically Signed   By: Eddie Candle M.D.   On: 03/11/2021 13:16   CT HEAD WO CONTRAST  Result Date: 03/11/2021 CLINICAL DATA:  Transient ischemic attack.  Slurred speech. EXAM: CT HEAD WITHOUT CONTRAST TECHNIQUE: Contiguous axial images were obtained from the base of the skull through the vertex without intravenous contrast. COMPARISON:  07/16/2020 FINDINGS: Brain: There is no evidence for acute hemorrhage, hydrocephalus, mass lesion, or abnormal extra-axial fluid collection. No definite CT evidence for acute infarction. Patchy low attenuation in the deep hemispheric and periventricular white matter is nonspecific, but likely reflects chronic microvascular ischemic demyelination. Vascular: No hyperdense vessel or unexpected calcification. Skull: No evidence for fracture. No worrisome lytic or sclerotic lesion. Sinuses/Orbits: The visualized paranasal sinuses and mastoid air cells are clear. Visualized portions of the globes and intraorbital fat are unremarkable. Other: None.  IMPRESSION: 1. Stable.  No acute intracranial abnormality. 2. Chronic small vessel white matter ischemic disease. Electronically Signed   By: Misty Stanley M.D.   On: 03/11/2021 13:32   CT Angio Chest PE W and/or Wo Contrast  Result Date: 03/12/2021 CLINICAL DATA:  Positive D-dimer. EXAM: CT ANGIOGRAPHY CHEST WITH CONTRAST TECHNIQUE: Multidetector CT imaging of the chest was performed using the standard protocol during bolus administration of intravenous contrast. Multiplanar CT image reconstructions and MIPs were obtained to evaluate the vascular anatomy. CONTRAST:  6m OMNIPAQUE IOHEXOL 350 MG/ML SOLN COMPARISON:  October 08, 2015. FINDINGS: Cardiovascular: Satisfactory opacification of the pulmonary arteries to the segmental level. No evidence of pulmonary embolism. Normal heart size. No pericardial effusion. Atherosclerosis of thoracic aorta is noted without aneurysm formation. Mediastinum/Nodes: No enlarged mediastinal, hilar, or axillary lymph nodes. Thyroid gland, trachea, and esophagus demonstrate no significant findings. Lungs/Pleura: No pneumothorax or pleural effusion is noted. Stable biapical scarring is noted. Aspirated material is noted in the trachea and right mainstem bronchus. Multiple ill-defined opacities are noted in both lung bases, left greater than right, concerning for multifocal pneumonia. Upper Abdomen: No acute abnormality. Musculoskeletal: No chest wall abnormality. No acute or significant osseous findings. Review of the MIP images confirms the above findings. IMPRESSION: No definite evidence of pulmonary embolus. Multiple ill-defined opacities are noted in both lung bases, left greater than right, concerning for multifocal pneumonia. Aspirated material is noted in the trachea and right mainstem bronchus. Aortic Atherosclerosis (ICD10-I70.0). Electronically Signed   By: JMarijo ConceptionM.D.   On:  03/12/2021 17:55   DG Chest Port 1 View  Result Date: 03/12/2021 CLINICAL DATA:  Weakness  EXAM: PORTABLE CHEST 1 VIEW COMPARISON:  Chest radiograph 03/11/2021 FINDINGS: The cardiomediastinal silhouette is stable. There are patchy opacities in the lateral left base which are increased since yesterday's study. There is no other focal airspace disease. There is no pleural effusion or pneumothorax. There is no acute osseous abnormality. IMPRESSION: New patchy opacities in the lateral left base may reflect infection or aspiration in the correct clinical setting. Electronically Signed   By: Valetta Mole M.D.   On: 03/12/2021 15:15    Pending Labs Unresulted Labs (From admission, onward)     Start     Ordered   03/13/21 [id number]  Basic metabolic panel  Tomorrow morning,   STAT        03/12/21 1931   03/13/21 0500  CBC  Tomorrow morning,   STAT        03/12/21 1931   03/12/21 1930  Legionella Pneumophila Serogp 1 Ur Ag  (COPD / Pneumonia / Cellulitis / Lower Extremity Wound)  Once,   STAT        03/12/21 1931   03/12/21 1930  Strep pneumoniae urinary antigen  (COPD / Pneumonia / Cellulitis / Lower Extremity Wound)  Once,   STAT        03/12/21 1931   03/12/21 1930  Expectorated Sputum Assessment w Gram Stain, Rflx to Resp Cult  (COPD / Pneumonia / Cellulitis / Lower Extremity Wound)  Once,   STAT        03/12/21 1931   03/12/21 1705  Procalcitonin - Baseline  Add-on,   AD        03/12/21 1704   03/12/21 1704  Culture, blood (Routine X 2) w Reflex to ID Panel  BLOOD CULTURE X 2,   STAT      03/12/21 1704            Vitals/Pain Today's Vitals   03/12/21 1500 03/12/21 1530 03/12/21 1630 03/12/21 1700  BP: 130/80 132/83 110/69 105/70  Pulse: 82 67 66 71  Resp: 20 (!) 23 (!) 22 17  Temp:      TempSrc:      SpO2: 94% 98% 95% 95%  Weight:      Height:      PainSc:        Isolation Precautions Airborne and Contact precautions  Medications Medications  atorvastatin (LIPITOR) tablet 20 mg (20 mg Oral Given 03/12/21 1413)  lisinopril (ZESTRIL) tablet 5 mg (5 mg Oral Given 03/12/21  1413)  pantoprazole (PROTONIX) EC tablet 40 mg (40 mg Oral Given 03/12/21 1414)  acetaminophen (TYLENOL) tablet 650 mg (has no administration in time range)  sodium chloride flush (NS) 0.9 % injection 3 mL (3 mLs Intravenous Given 03/12/21 1621)  sodium chloride flush (NS) 0.9 % injection 3 mL (has no administration in time range)  0.9 %  sodium chloride infusion (has no administration in time range)  dextrose 5 % in lactated ringers infusion (0 mLs Intravenous Hold 03/12/21 1620)  vancomycin (VANCOCIN) IVPB 1000 mg/200 mL premix (has no administration in time range)  ceFEPIme (MAXIPIME) 2 g in sodium chloride 0.9 % 100 mL IVPB (2 g Intravenous New Bag/Given 03/12/21 1927)  famotidine (PEPCID) tablet 40 mg (has no administration in time range)  albuterol (VENTOLIN HFA) 108 (90 Base) MCG/ACT inhaler 2 puff (has no administration in time range)  benzonatate (TESSALON) capsule 200 mg (has no administration in time  range)  guaiFENesin (MUCINEX) 12 hr tablet 600 mg (has no administration in time range)  chlorpheniramine-HYDROcodone (TUSSIONEX) 10-8 MG/5ML suspension 5 mL (has no administration in time range)  ipratropium-albuterol (DUONEB) 0.5-2.5 (3) MG/3ML nebulizer solution 3 mL (has no administration in time range)  enoxaparin (LOVENOX) injection 40 mg (has no administration in time range)  azithromycin (ZITHROMAX) 500 mg in sodium chloride 0.9 % 250 mL IVPB (has no administration in time range)  0.9 %  sodium chloride infusion (has no administration in time range)  acetaminophen (TYLENOL) tablet 650 mg (has no administration in time range)    Or  acetaminophen (TYLENOL) suppository 650 mg (has no administration in time range)  traZODone (DESYREL) tablet 25 mg (has no administration in time range)  magnesium hydroxide (MILK OF MAGNESIA) suspension 30 mL (has no administration in time range)  ondansetron (ZOFRAN) tablet 4 mg (has no administration in time range)    Or  ondansetron (ZOFRAN) injection 4  mg (has no administration in time range)  Ampicillin-Sulbactam (UNASYN) 3 g in sodium chloride 0.9 % 100 mL IVPB (has no administration in time range)  dextrose 50 % solution 50 mL (0 mLs Intravenous Hold 03/12/21 1620)  iohexol (OMNIPAQUE) 350 MG/ML injection 75 mL (75 mLs Intravenous Contrast Given 03/12/21 1720)    Mobility walks with person assist Low fall risk   Focused Assessments    R Recommendations: See Admitting Provider Note  Report given to:   Additional Notes:

## 2021-03-12 NOTE — ED Notes (Signed)
Pt refusing to drink with straw and states that he needs soft foods b/c he can not chew.

## 2021-03-12 NOTE — ED Triage Notes (Signed)
Pt arrives via [facility], reports generalized weakness progressive in the past month. Pt lives at home and called [facility] reporting that he could not get up.  [facility] helped pt to ambulate.  Pt was able to ambulate but very shaky and unstable per EMS. Hx of throat cancer reported. Voice Is hard to understand per EMS.

## 2021-03-12 NOTE — ED Notes (Signed)
Pt eating from sandwhich tray,  gave another 4oz juice.  Notified MD.

## 2021-03-13 ENCOUNTER — Inpatient Hospital Stay: Payer: Medicare Other

## 2021-03-13 DIAGNOSIS — D649 Anemia, unspecified: Secondary | ICD-10-CM | POA: Diagnosis not present

## 2021-03-13 DIAGNOSIS — C109 Malignant neoplasm of oropharynx, unspecified: Secondary | ICD-10-CM

## 2021-03-13 DIAGNOSIS — J690 Pneumonitis due to inhalation of food and vomit: Secondary | ICD-10-CM | POA: Diagnosis not present

## 2021-03-13 LAB — CBC
HCT: 31.6 % — ABNORMAL LOW (ref 39.0–52.0)
Hemoglobin: 10.6 g/dL — ABNORMAL LOW (ref 13.0–17.0)
MCH: 27.5 pg (ref 26.0–34.0)
MCHC: 33.5 g/dL (ref 30.0–36.0)
MCV: 82.1 fL (ref 80.0–100.0)
Platelets: 259 10*3/uL (ref 150–400)
RBC: 3.85 MIL/uL — ABNORMAL LOW (ref 4.22–5.81)
RDW: 14.6 % (ref 11.5–15.5)
WBC: 8.9 10*3/uL (ref 4.0–10.5)
nRBC: 0 % (ref 0.0–0.2)

## 2021-03-13 LAB — BASIC METABOLIC PANEL
Anion gap: 7 (ref 5–15)
BUN: 18 mg/dL (ref 8–23)
CO2: 26 mmol/L (ref 22–32)
Calcium: 8.5 mg/dL — ABNORMAL LOW (ref 8.9–10.3)
Chloride: 99 mmol/L (ref 98–111)
Creatinine, Ser: 0.94 mg/dL (ref 0.61–1.24)
GFR, Estimated: >60 mL/min (ref >60)
Glucose, Bld: 57 mg/dL — ABNORMAL LOW (ref 70–99)
Potassium: 3.8 mmol/L (ref 3.5–5.1)
Sodium: 132 mmol/L — ABNORMAL LOW (ref 135–145)

## 2021-03-13 LAB — GLUCOSE, CAPILLARY
Glucose-Capillary: 156 mg/dL — ABNORMAL HIGH (ref 70–99)
Glucose-Capillary: 51 mg/dL — ABNORMAL LOW (ref 70–99)
Glucose-Capillary: 94 mg/dL (ref 70–99)
Glucose-Capillary: 86 mg/dL (ref 70–99)
Glucose-Capillary: 49 mg/dL — ABNORMAL LOW (ref 70–99)
Glucose-Capillary: 56 mg/dL — ABNORMAL LOW (ref 70–99)
Glucose-Capillary: 56 mg/dL — ABNORMAL LOW (ref 70–99)
Glucose-Capillary: 72 mg/dL (ref 70–99)
Glucose-Capillary: 176 mg/dL — ABNORMAL HIGH (ref 70–99)

## 2021-03-13 LAB — LACTIC ACID, PLASMA: Lactic Acid, Venous: 1.3 mmol/L (ref 0.5–1.9)

## 2021-03-13 MED ORDER — DEXTROSE 50 % IV SOLN
INTRAVENOUS | Status: AC
  Filled 2021-03-13: qty 50

## 2021-03-13 MED ORDER — DEXTROSE 50 % IV SOLN
25.0000 g | INTRAVENOUS | Status: AC
  Administered 2021-03-13: 25 g via INTRAVENOUS
  Filled 2021-03-13: qty 50

## 2021-03-13 MED ORDER — SODIUM CHLORIDE 0.9 % IV BOLUS
500.0000 mL | Freq: Once | INTRAVENOUS | Status: AC
  Administered 2021-03-13: 500 mL via INTRAVENOUS

## 2021-03-13 MED ORDER — DEXTROSE-NACL 5-0.9 % IV SOLN: INTRAVENOUS | Status: DC

## 2021-03-13 MED ORDER — INSULIN ASPART 100 UNIT/ML IJ SOLN
0.0000 [IU] | Freq: Three times a day (TID) | INTRAMUSCULAR | Status: DC
  Administered 2021-03-15: 09:00:00 1 [IU] via SUBCUTANEOUS
  Filled 2021-03-13: qty 1

## 2021-03-13 NOTE — Evaluation (Signed)
Occupational Therapy Evaluation Patient Details Name: Louis Stone MRN: [id number] DOB: [birth year] Today's Date: 03/13/2021    History of Present Illness Louis Stone is a 70 y.o. 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   Clinical Impression   Louis Stone was seen for OT evaluation this date. Prior to hospital admission, pt was Independent for mobility and ADLs, uses RW PRN. Pt lives alone in home c ramped entrance, per chart may be unhygienic living situation. Pt instructed on ECS and HEP for functional strengthening. Pt demonstrates near baseline independence to perform ADL and mobility tasks including retrieving items from above head height cabinet and ankle height cabinet with no LOBs and no AD use. No skilled OT needs identified. Will sign off. Please re-consult if additional OT needs arise.      Follow Up Recommendations  No OT follow up;Supervision - Intermittent    Equipment Recommendations  None recommended by OT    Recommendations for Other Services       Precautions / Restrictions Precautions Precautions: Fall Restrictions Weight Bearing Restrictions: No      Mobility Bed Mobility Overal bed mobility: Independent                  Transfers Overall transfer level: Independent                    Balance Overall balance assessment: Independent Sitting-balance support: No upper extremity supported;Bilateral upper extremity supported Sitting balance-Leahy Scale: Normal       Standing balance-Leahy Scale: Fair Standing balance comment: No issues with static standing                            ADL either performed or assessed with clinical judgement   ADL Overall ADL's : Independent                                       General ADL Comments: Retrieves items from above head height cabinet and ankle height cabinet with no LOBs and no AD use      Pertinent Vitals/Pain Pain Assessment: No/denies pain     Hand Dominance Right   Extremity/Trunk Assessment Upper Extremity Assessment Upper Extremity Assessment: Overall WFL for tasks assessed   Lower Extremity Assessment Lower Extremity Assessment: Overall WFL for tasks assessed   Cervical / Trunk Assessment Cervical / Trunk Assessment: Normal   Communication Communication Communication: HOH;Expressive difficulties   Cognition Arousal/Alertness: Awake/alert Behavior During Therapy: WFL for tasks assessed/performed Overall Cognitive Status: Within Functional Limits for tasks assessed                                     General Comments       Exercises Exercises: Other exercises Other Exercises Other Exercises: Pt educated re: DME recs, d/c recs, falls prevention, ECS Other Exercises: Functional reach, bed mobility, sitting/stnading balance/tolerance   Shoulder Instructions      Home Living Family/patient expects to be discharged to:: Private residence Living Arrangements: Alone Available  Help at Discharge: Family;Available PRN/intermittently Type of Home: House Home Access: Ramped entrance     Home Layout: One level     Bathroom Shower/Tub: Teacher, early years/pre: Standard Bathroom Accessibility: Yes   Home Equipment: Environmental consultant - 2 wheels;Hand held shower head;Grab bars - tub/shower   Additional Comments: Step-DTRs available as needed for support      Prior Functioning/Environment Level of Independence: Independent with assistive device(s)        Comments: Reports has RW but sparingly uses it.        OT Problem  List: Decreased activity tolerance         OT Goals(Current goals can be found in the care plan section) Acute Rehab OT Goals Patient Stated Goal: go home OT Goal Formulation: With patient Time For Goal Achievement: 03/27/21 Potential to Achieve Goals: Good   AM-PAC OT "6 Clicks" Daily Activity     Outcome Measure Help from another person eating meals?: None Help from another person taking care of personal grooming?: None Help from another person toileting, which includes using toliet, bedpan, or urinal?: None Help from another person bathing (including washing, rinsing, drying)?: None Help from another person to put on and taking off regular upper body clothing?: None Help from another person to put on and taking off regular lower body clothing?: None 6 Click Score: 24   End of Session    Activity Tolerance: Patient tolerated treatment well Patient left: in bed;with call bell/phone within reach;with bed alarm set  OT Visit Diagnosis: Other abnormalities of gait and mobility (R26.89)                Time: [id number] OT Time Calculation (min): 7 min Charges:  OT General Charges $OT Visit: 1 Visit OT Evaluation $OT Eval Low Complexity: 1 Low  Dessie Coma, M.S. OTR/L  03/13/21, 12:22 PM  ascom [phone]

## 2021-03-13 NOTE — Progress Notes (Signed)
Paged Dr. Sidney Ace regarding low BP. Pt asymptomatic at this time. Ordered to give 563m bolus. Recheck BP once bolus finished.   03/13/21 0535  Vitals  Temp 98.1 F (36.7 C)  BP (!) 85/63  MAP (mmHg) 70  BP Location Left Arm  BP Method Automatic  Patient Position (if appropriate) Lying  Pulse Rate 71  Resp 18  MEWS COLOR  MEWS Score Color Green  Oxygen Therapy  SpO2 97 %  MEWS Score  MEWS Temp 0  MEWS Systolic 1  MEWS Pulse 0  MEWS RR 0  MEWS LOC 0  MEWS Score 1

## 2021-03-13 NOTE — Evaluation (Signed)
Physical Therapy Evaluation Patient Details Name: Louis Stone MRN: [id number] DOB: [birth year] Today's Date: 03/13/2021   History of Present Illness  Louis Stone is a 70 y.o. 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   Clinical Impression  Pt admitted with above diagnosis. Pt received sitting upright in bed agreeable to PT services. Pt able to state home lay out, PLOF, DME, etc. Without difficulty. Prior to mobility HR: 82 BPM, SPO2: 94%. Pt's PLOF is indep with ADL's/IADL's and has intermittent help from step daughter and does report very rare use of RW when he feels like he may need it. Over all pt is Mod-I with bed mobility and STS. Use of RW due to reports of significant weakness. Supervision provided with pt ambulating 180' with step through pattern and good gait speed with no LOB or sway noted. Pt returned to EOB and trialed walking with no AD. Minguard provided for safety with minor drifting R and L but no LOB and pt able to correct independently. Still maintaining step through pattern and supervision with final 100' to room returning to supine in bed. Vitals post amb: HR: 88 BPM and SPO2: 96%. Pt educated on benefits of [facility] to assist in balance and safety within home environment. No acute needs identified and PT to sign off as pt appears to be at baseline function. Please re-consult if change in medical status occurs.     Follow Up Recommendations Home health PT    Equipment Recommendations  None recommended by PT    Recommendations for Other Services       Precautions / Restrictions  Precautions Precautions: Fall Restrictions Weight Bearing Restrictions: No      Mobility  Bed Mobility Overal bed mobility: Modified Independent               Patient Response: Cooperative  Transfers Overall transfer level: Modified independent                  Ambulation/Gait Ambulation/Gait assistance: Supervision Gait Distance (Feet): 360 Feet Assistive device: Rolling walker (2 wheeled);None (Trialed 1 lap nurses station with RW then no AD) Gait Pattern/deviations: Step-through pattern;Drifts right/left     General Gait Details: Very subtle drift R/L that pt was able to correct without physical assist  Stairs            Wheelchair Mobility    Modified Rankin (Stroke Patients Only)       Balance Overall balance assessment: Needs assistance Sitting-balance support: No upper extremity supported;Bilateral upper extremity supported Sitting balance-Leahy Scale: Normal       Standing balance-Leahy Scale: Fair Standing balance comment: No issues with static standing                             Pertinent Vitals/Pain Pain Assessment: No/denies pain    Home Living Family/patient expects to be discharged to:: Private residence Living Arrangements: Alone Available Help at Discharge: Family;Available PRN/intermittently Type of Home: House Home Access: Ramped entrance     Home Layout: One level Home Equipment: Walker - 2 wheels;Hand held shower head;Grab bars - tub/shower Additional Comments: Step-DTRs available as needed for support  Prior Function Level of Independence: Independent with assistive device(s)         Comments: Reports has RW but sparingly uses it.     Hand Dominance   Dominant Hand: Right    Extremity/Trunk Assessment   Upper Extremity Assessment Upper Extremity Assessment: Overall WFL for tasks assessed    Lower Extremity Assessment Lower Extremity Assessment: Overall WFL for tasks assessed     Cervical / Trunk Assessment Cervical / Trunk Assessment: Normal  Communication   Communication: HOH;Expressive difficulties  Cognition Arousal/Alertness: Awake/alert Behavior During Therapy: WFL for tasks assessed/performed Overall Cognitive Status: Within Functional Limits for tasks assessed                                        General Comments      Exercises Other Exercises Other Exercises: Role of PT in acute setting   Assessment/Plan    PT Assessment Patent does not need any further PT services  PT Problem List         PT Treatment Interventions      PT Goals (Current goals can be found in the Care Plan section)  Acute Rehab PT Goals Patient Stated Goal: go home PT Goal Formulation: With patient Time For Goal Achievement: 03/27/21 Potential to Achieve Goals: Good    Frequency     Barriers to discharge        Co-evaluation               AM-PAC PT "6 Clicks" Mobility  Outcome Measure Help needed turning from your back to your side while in a flat bed without using bedrails?: None Help needed moving from lying on your back to sitting on the side of a flat bed without using bedrails?: None Help needed moving to and from a bed to a chair (including a wheelchair)?: None Help needed standing up from a chair using your arms (e.g., wheelchair or bedside chair)?: None Help needed to walk in hospital room?: A Little Help needed climbing 3-5 steps with a railing? : A Little 6 Click Score: 22    End of Session Equipment Utilized During Treatment: Gait belt Activity Tolerance: Patient tolerated treatment well Patient left: in bed;with bed alarm set;with call bell/phone within reach Nurse Communication: Mobility status PT Visit Diagnosis: Unsteadiness on feet (R26.81)    Time: [id number] PT Time Calculation (min) (ACUTE ONLY): 25 min   Charges:   PT Evaluation $PT Eval Low Complexity: 1 Low PT Treatments $Gait Training: 8-22 mins        Salem Caster. Fairly IV, PT, DPT Physical Therapist- [facility]  03/13/2021, 10:44 AM

## 2021-03-13 NOTE — Evaluation (Addendum)
Objective Swallowing Evaluation: Type of Study: MBS-Modified Barium Swallow Study   Patient Details  Name: Louis Stone MRN: [id number] Date of Birth: 11/28/1950  Today's Date: 03/13/2021 Time: SLP Start Time (ACUTE ONLY): [id number] -SLP Stop Time (ACUTE ONLY): 1330  SLP Time Calculation (min) (ACUTE ONLY): 75 min   Past Medical History:  Past Medical History:  Diagnosis Date   Arthritis    per pt. I do not have md notes that indicate dx.   Diabetes mellitus without complication ([place])    Oropharynx cancer ([place]) 2009   squamous cell cancer  Stage T3, N2c, M0   Personal history of tobacco use, presenting hazards to health 10/08/2015   Past Surgical History:  Past Surgical History:  Procedure Laterality Date   [place]   pt states ruptured disc and had surgery to repair it   HPI: Pt  is a 70 y.o. 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  It seems that when patient was discharged they recommended SNF due to overall deconditioning weakness the patient had refused this at that time.  He states that he has been so weak that he can hardly stand up without help.  He denies any recent falls or injuries, headache, earache, sore throat, shortness of breath, chest pain.  CT of Chest on 03/12/2021: Aspirated material is noted in the trachea and right mainstem bronchus. Multiple ill-defined opacities are noted in both lung bases, left greater than right, concerning for multifocal pneumonia.  Per chart notes, pt was recently admitted with  severe sepsis 2/2 multifocal PNA, admitted to SDU requriing suuplemental O2. chest x-ray revealed patchy bilateral airspace disease most compatible with pneumonia. Pt currently under APS services for evaluation (started 01/31/2021) of bedbug and [place] roach infestation.   Subjective: pt awake/alert; verbal w/ dysarthria noted secondary to restricted ROM of jaw(trismus) and lingual movement. Followed instructions.    Assessment / Plan / Recommendation  CHL IP CLINICAL IMPRESSIONS 03/13/2021  Clinical Impression Pt appears to present w/ Severe-Profound oropharyngeal phase dysphagia and pharyngo-esophageal phase dysphagia. This resulted in consistent Laryngeal Penetration and Aspiration(SILENT) w/ the 2 po trials given during this exam. The exam was terminated after 2 trials based on the above and from recommendation of Radiologist (who was asked to view study secondary to questionable anatomical features noted during the exam).    During this exam today, pt was presented w/ 1 TSP trial of Nectar liquid and 1 TSP trial of thin liquid. Severe-profound pharyngo-esophageal phase deficits noted resulting in an ineffective, unsafe swallow.  Poor bolus motility through the pharynx and into the esohpagus resulted in collection of pharyngeal residue which resulted in SILENT Aspiration. Also noted: delayed pharyngeal swallow initiation; reduced laryngeal excursion and anterior movements; reduced pharyngeal pressure during the swallow; little to no epiglottic inversion.  During the oral phase, reduced mandible excursion noted w/ reduced lingual movement for bolus control of coordinated A-P transfer, premature spillage/loss of bolus into pharynx and oral residue was noted. Severe Trismus reduced mandible ROM/movements for mouth opening to receive boluses, even liquids by TSP.  During the Esophageal phase, noted apparent prominent cervical esophageal tissue at/below the CP  muscle(anterior wall) w/ possible prominent CP  muscle also and tissue protrusion along the posterior wall below UES. Overall, minimal amounts of bolus material moved into/through the UES.    Due to pt's presentation, the Radiologist was asked to view the study w/ this SLP; he suggested no further trials be given d/t the nature of the dysphagia and Aspiration. He agreed w/ the recommendation for ENT consult w/ Direct View of the pharyngo-esophageal area for further examination and recommendations.  Per above, recommend NPO status w/ frequent oral care for hygiene and stimulation of swallowing. Consideration of Pleasure, Single ice chips post thorough oral care for Pleasure and therapeutic reasons. Recommend removal and cleaning of Dentures. Also informed MD that it could be difficult to pass an NG at bedside through the lower pharynx into the Esophagus d/t the tightness and incomplete swallow function, per this study's presentation today. ST services will monitor pt's status and f/u w/ any needed education. A Palliative Care consult for [place] could be beneficial post further assessment d/t overall medical status including severe Malnutrition baseline. Recommend consideration of continued ST services at next venue of care to address education and therapeutic exercises in order to preserve any degree of swallowing.   SLP Visit Diagnosis Dysphagia, oropharyngeal phase (R13.12);Dysphagia, pharyngoesophageal phase (R13.14)  Attention and concentration deficit following --  Frontal lobe and executive function deficit following --  Impact on safety and function Severe aspiration risk;Risk for inadequate nutrition/hydration      CHL IP TREATMENT RECOMMENDATION 03/13/2021  Treatment Recommendations Patient unable to participate in swallow therapy at this time     Prognosis 03/13/2021  Prognosis for Safe Diet Advancement Guarded  Barriers to Reach Goals Severity of deficits;Time post onset  Barriers/Prognosis Comment --    CHL IP DIET RECOMMENDATION 03/13/2021   SLP Diet Recommendations NPO  Liquid Administration via --  Medication Administration (No Data)  Compensations --  Postural Changes --      CHL IP OTHER RECOMMENDATIONS 03/13/2021  Recommended Consults Consider ENT evaluation  Oral Care Recommendations Oral care QID;Patient independent with oral care  Other Recommendations --      CHL IP FOLLOW UP RECOMMENDATIONS 03/13/2021  Follow up Recommendations (No Data)      CHL IP FREQUENCY AND DURATION 03/13/2021  Speech Therapy Frequency (ACUTE ONLY) (No Data)  Treatment Duration (No Data)           CHL IP ORAL PHASE 03/13/2021  Oral Phase Impaired  Oral - Pudding Teaspoon --  Oral - Pudding Cup --  Oral - Honey Teaspoon --  Oral - Honey Cup --  Oral - Nectar Teaspoon 1  Oral - Nectar Cup --  Oral - Nectar Straw --  Oral - Thin Teaspoon 1  Oral - Thin Cup --  Oral - Thin Straw --  Oral - Puree --  Oral - Mech Soft --  Oral - Regular --  Oral - Multi-Consistency --  Oral - Pill --  Oral Phase - Comment reduce lingual movement for bolus control of coordinated A-P transfer; premature spillage/loss of bolus into pharynx; oral residue; reduced mandible ROM/movements    CHL IP PHARYNGEAL PHASE 03/13/2021  Pharyngeal Phase Impaired  Pharyngeal- Pudding Teaspoon --  Pharyngeal --  Pharyngeal- Pudding Cup --  Pharyngeal --  Pharyngeal- Honey Teaspoon --  Pharyngeal --  Pharyngeal- Honey Cup --  Pharyngeal --  Pharyngeal- Nectar Teaspoon 1  Pharyngeal --  Pharyngeal- Nectar Cup --  Pharyngeal --  Pharyngeal- Nectar Straw --  Pharyngeal --  Pharyngeal- Thin Teaspoon 1  Pharyngeal --  Pharyngeal- Thin Cup --  Pharyngeal --  Pharyngeal- Thin Straw --  Pharyngeal --  Pharyngeal- Puree --  Pharyngeal --  Pharyngeal- Mechanical Soft --  Pharyngeal --  Pharyngeal- Regular --  Pharyngeal --  Pharyngeal- Multi-consistency --  Pharyngeal --  Pharyngeal- Pill --  Pharyngeal --  Pharyngeal Comment delayed pharyngeal swallow  initiation; reduced laryngeal excursion and anterior movements; reduced pharyngeal pressure during the swallow; little to no epiglottic inversion; severe pharyngeal residue w/ poor clearance through the UES     CHL IP CERVICAL ESOPHAGEAL PHASE 03/13/2021  Cervical Esophageal Phase Impaired  Pudding Teaspoon --  Pudding Cup --  Honey Teaspoon --  Honey Cup --  Nectar Teaspoon 1  Nectar Cup --  Nectar Straw --  Thin Teaspoon 1  Thin Cup --  Thin Straw --  Puree --  Mechanical Soft --  Regular --  Multi-consistency --  Pill --  Cervical Esophageal Comment apparent prominent cervical esophageal tissue at/below the CP muscle(anterior wall) w/ possible prominent CP muscle also; tissue protrusion along the posterior wall below UES; minimal amounts of bolus material moved into/through the UES             Orinda Kenner, MS, [employer] [facility] [phone] Maiyah Goyne 03/13/2021, 2:15 PM

## 2021-03-13 NOTE — Progress Notes (Addendum)
PROGRESS NOTE    Louis Stone  IEP:[id number] DOB: [birth year] DOA: 03/12/2021 PCP: Theotis Burrow, MD   Assessment & Plan:   Active Problems:   Aspiration pneumonia (HCC)   Likely aspiration pneumonia: continue on IV unasyn, bronchodilators & encourage incentive spirometry. Legionella, strep ordered  Sepsis: met criteria w/ tachycardia, tachypnea and pneumonia. Continue on IVFs & IV abxs. Blood cxs are pending.   DM2: likely well controlled. W/ hypoglycemic episodes. Started on D5NS  Normocytic anemia: H&H are labile. No need for a transfusion currently   HLD: continue on statin   HTN: continue on home dose of lisinopril   GERD: continue on PPI   Oropharyngeal squamous cell carcinoma: stage IV. Management as per oncology  Dysphagia: likely secondary to hx of oropharyngeal squamous cell carcinoma. Swallow study shows severe oropharyngeal phase dysphagia w/ aspiration with liquids. Discussed case w/ ENT, Dr. Freda Munro, and pt was seen in May by Dr. 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 Not surprising pt has swallowing issues after treatment as per ENT. May need a PEG tube.    DVT prophylaxis: lovenox  Code Status: full  Family Communication: Disposition Plan: likely d/c back home   Level of care: Med-Surg  Status is: Inpatient  Remains inpatient appropriate because:Unsafe d/c plan, IV treatments appropriate due to intensity of illness or inability to take PO, and Inpatient level of care appropriate due to severity of illness  Dispo: The patient is from: Home              Anticipated d/c is to: Home              Patient currently is not medically stable to d/c.   Difficult to place patient Yes    Consultants:  ENT  Procedures:   Antimicrobials: unasyn    Subjective: Pt c/o severe fatigue   Objective: Vitals:   03/12/21 2105  03/12/21 2107 03/13/21 0535 03/13/21 0537  BP:  108/78 (!) 85/63 (!) 84/55  Pulse:  90 71 71  Resp:  18 18   Temp:  99.6 F (37.6 C) 98.1 F (36.7 C)   TempSrc:      SpO2:  100% 97%   Weight: 54.3 kg     Height: _0  (1.6 m)       Intake/Output Summary (Last 24 hours) at 03/13/2021 0753 Last data filed at 03/13/2021 0532 Gross per 24 hour  Intake --  Output 825 ml  Net -825 ml   Filed Weights   03/12/21 1227 03/12/21 2105  Weight: 70 kg 54.3 kg    Examination:  General exam: Appears calm and comfortable  Respiratory system: diminished breath sounds b/l  Cardiovascular system: S1 & S2+. No rubs, gallops or clicks.  Gastrointestinal system: Abdomen is nondistended, soft and nontender. Normal bowel sounds heard. Central nervous system: Alert and oriented. Moves all extremities  Psychiatry: Judgement and insight appear poor. Flat mood and affect     Data Reviewed: I have personally reviewed following labs and imaging studies  CBC: Recent Labs  Lab 03/11/21 1157 03/12/21 1224 03/13/21 0515  WBC 8.2 8.2 8.9  NEUTROABS 7.5 7.5  --   HGB 11.9* 12.4* 10.6*  HCT 35.1* 37.8* 31.6*  MCV 81.6 81.1 82.1  PLT 274 304 660   Basic Metabolic Panel: Recent Labs  Lab 03/11/21 1157 03/12/21 1224 03/13/21 0515  NA  133* 133* 132*  K 4.4 4.0 3.8  CL 97* 98 99  CO2 _0 GLUCOSE 78 51* 57*  BUN 25* 22 18  CREATININE 0.97 1.19 0.94  CALCIUM 9.4 9.2 8.5*   GFR: Estimated Creatinine Clearance: 57.8 mL/min (by C-G formula based on SCr of 0.94 mg/dL). Liver Function Tests: Recent Labs  Lab 03/11/21 1157 03/12/21 1224  AST 26 28  ALT 17 14  ALKPHOS 76 81  BILITOT 0.9 0.9  PROT 8.4* 8.4*  ALBUMIN 3.9 3.5   No results for input(s): LIPASE, AMYLASE in the last 168 hours. No results for input(s): AMMONIA in the last 168 hours. Coagulation Profile: Recent Labs  Lab 03/11/21 1157 03/12/21 2242  INR 1.1 1.2   Cardiac Enzymes: No results for input(s): CKTOTAL,  CKMB, CKMBINDEX, TROPONINI in the last 168 hours. BNP (last 3 results) No results for input(s): PROBNP in the last 8760 hours. HbA1C: No results for input(s): HGBA1C in the last 72 hours. CBG: Recent Labs  Lab 03/12/21 1437 03/12/21 1458 03/12/21 1716 03/12/21 1926 03/12/21 2114  GLUCAP 46* 251* 130* 179* 204*   Lipid Profile: No results for input(s): CHOL, HDL, LDLCALC, TRIG, CHOLHDL, LDLDIRECT in the last 72 hours. Thyroid Function Tests: No results for input(s): TSH, T4TOTAL, FREET4, T3FREE, THYROIDAB in the last 72 hours. Anemia Panel: No results for input(s): VITAMINB12, FOLATE, FERRITIN, TIBC, IRON, RETICCTPCT in the last 72 hours. Sepsis Labs: Recent Labs  Lab 03/12/21 1500 03/12/21 2242 03/13/21 0515  PROCALCITON 0.16  --   --   LATICACIDVEN  --  2.1* 1.3    Recent Results (from the past 240 hour(s))  Resp Panel by RT-PCR (Flu A&B, Covid) Nasopharyngeal Swab     Status: None   Collection Time: 03/11/21 12:50 PM   Specimen: Nasopharyngeal Swab; Nasopharyngeal(NP) swabs in vial transport medium  Result Value Ref Range Status   SARS Coronavirus 2 by RT PCR NEGATIVE NEGATIVE Final    Comment: (NOTE) SARS-CoV-2 target nucleic acids are NOT DETECTED.  The SARS-CoV-2 RNA is generally detectable in upper respiratory specimens during the acute phase of infection. The lowest concentration of SARS-CoV-2 viral copies this assay can detect is 138 copies/mL. A negative result does not preclude SARS-Cov-2 infection and should not be used as the sole basis for treatment or other patient management decisions. A negative result may occur with  improper specimen collection/handling, submission of specimen other than nasopharyngeal swab, presence of viral mutation(s) within the areas targeted by this assay, and inadequate number of viral copies(<138 copies/mL). A negative result must be combined with clinical observations, patient history, and epidemiological information. The  expected result is Negative.  Fact Sheet for Patients:  EntrepreneurPulse.com.au  Fact Sheet for Healthcare Providers:  IncredibleEmployment.be  This test is no t yet approved or cleared by the [place] FDA and  has been authorized for detection and/or diagnosis of SARS-CoV-2 by FDA under an Emergency Use Authorization (EUA). This EUA will remain  in effect (meaning this test can be used) for the duration of the COVID-19 declaration under Section 564(b)(1) of the Act, 21 U.S.C.section 360bbb-3(b)(1), unless the authorization is terminated  or revoked sooner.       Influenza A by PCR NEGATIVE NEGATIVE Final   Influenza B by PCR NEGATIVE NEGATIVE Final    Comment: (NOTE) The Xpert Xpress SARS-CoV-2/FLU/RSV plus assay is intended as an aid in the diagnosis of influenza from Nasopharyngeal swab specimens and should not be used as a sole basis for  treatment. Nasal washings and aspirates are unacceptable for Xpert Xpress SARS-CoV-2/FLU/RSV testing.  Fact Sheet for Patients: EntrepreneurPulse.com.au  Fact Sheet for Healthcare Providers: IncredibleEmployment.be  This test is not yet approved or cleared by the [place] FDA and has been authorized for detection and/or diagnosis of SARS-CoV-2 by FDA under an Emergency Use Authorization (EUA). This EUA will remain in effect (meaning this test can be used) for the duration of the COVID-19 declaration under Section 564(b)(1) of the Act, 21 U.S.C. section 360bbb-3(b)(1), unless the authorization is terminated or revoked.  Performed at [facility], [address] [place], [place] [address]   Resp Panel by RT-PCR (Flu A&B, Covid) Nasopharyngeal Swab     Status: None   Collection Time: 03/12/21  3:00 PM   Specimen: Nasopharyngeal Swab; Nasopharyngeal(NP) swabs in vial transport medium  Result Value Ref Range Status   SARS Coronavirus 2 by RT PCR  NEGATIVE NEGATIVE Final    Comment: (NOTE) SARS-CoV-2 target nucleic acids are NOT DETECTED.  The SARS-CoV-2 RNA is generally detectable in upper respiratory specimens during the acute phase of infection. The lowest concentration of SARS-CoV-2 viral copies this assay can detect is 138 copies/mL. A negative result does not preclude SARS-Cov-2 infection and should not be used as the sole basis for treatment or other patient management decisions. A negative result may occur with  improper specimen collection/handling, submission of specimen other than nasopharyngeal swab, presence of viral mutation(s) within the areas targeted by this assay, and inadequate number of viral copies(<138 copies/mL). A negative result must be combined with clinical observations, patient history, and epidemiological information. The expected result is Negative.  Fact Sheet for Patients:  EntrepreneurPulse.com.au  Fact Sheet for Healthcare Providers:  IncredibleEmployment.be  This test is no t yet approved or cleared by the [place] FDA and  has been authorized for detection and/or diagnosis of SARS-CoV-2 by FDA under an Emergency Use Authorization (EUA). This EUA will remain  in effect (meaning this test can be used) for the duration of the COVID-19 declaration under Section 564(b)(1) of the Act, 21 U.S.C.section 360bbb-3(b)(1), unless the authorization is terminated  or revoked sooner.       Influenza A by PCR NEGATIVE NEGATIVE Final   Influenza B by PCR NEGATIVE NEGATIVE Final    Comment: (NOTE) The Xpert Xpress SARS-CoV-2/FLU/RSV plus assay is intended as an aid in the diagnosis of influenza from Nasopharyngeal swab specimens and should not be used as a sole basis for treatment. Nasal washings and aspirates are unacceptable for Xpert Xpress SARS-CoV-2/FLU/RSV testing.  Fact Sheet for Patients: EntrepreneurPulse.com.au  Fact Sheet for  Healthcare Providers: IncredibleEmployment.be  This test is not yet approved or cleared by the [place] FDA and has been authorized for detection and/or diagnosis of SARS-CoV-2 by FDA under an Emergency Use Authorization (EUA). This EUA will remain in effect (meaning this test can be used) for the duration of the COVID-19 declaration under Section 564(b)(1) of the Act, 21 U.S.C. section 360bbb-3(b)(1), unless the authorization is terminated or revoked.  Performed at [facility], [address], [place], [place] [address]          Radiology Studies: DG Chest 2 View  Result Date: 03/11/2021 CLINICAL DATA:  Weakness, recent pneumonia EXAM: CHEST - 2 VIEW COMPARISON:  02/16/2021 FINDINGS: The heart size and mediastinal contours are within normal limits. Both lungs are clear. The visualized skeletal structures are unremarkable. IMPRESSION: No acute abnormality of the lungs. Electronically Signed   By: Cristie Hem  Laqueta Carina M.D.   On: 03/11/2021 13:16   CT HEAD WO CONTRAST  Result Date: 03/11/2021 CLINICAL DATA:  Transient ischemic attack.  Slurred speech. EXAM: CT HEAD WITHOUT CONTRAST TECHNIQUE: Contiguous axial images were obtained from the base of the skull through the vertex without intravenous contrast. COMPARISON:  07/16/2020 FINDINGS: Brain: There is no evidence for acute hemorrhage, hydrocephalus, mass lesion, or abnormal extra-axial fluid collection. No definite CT evidence for acute infarction. Patchy low attenuation in the deep hemispheric and periventricular white matter is nonspecific, but likely reflects chronic microvascular ischemic demyelination. Vascular: No hyperdense vessel or unexpected calcification. Skull: No evidence for fracture. No worrisome lytic or sclerotic lesion. Sinuses/Orbits: The visualized paranasal sinuses and mastoid air cells are clear. Visualized portions of the globes and intraorbital fat are unremarkable. Other: None. IMPRESSION:  1. Stable.  No acute intracranial abnormality. 2. Chronic small vessel white matter ischemic disease. Electronically Signed   By: Misty Stanley M.D.   On: 03/11/2021 13:32   CT Angio Chest PE W and/or Wo Contrast  Result Date: 03/12/2021 CLINICAL DATA:  Positive D-dimer. EXAM: CT ANGIOGRAPHY CHEST WITH CONTRAST TECHNIQUE: Multidetector CT imaging of the chest was performed using the standard protocol during bolus administration of intravenous contrast. Multiplanar CT image reconstructions and MIPs were obtained to evaluate the vascular anatomy. CONTRAST:  24m OMNIPAQUE IOHEXOL 350 MG/ML SOLN COMPARISON:  October 08, 2015. FINDINGS: Cardiovascular: Satisfactory opacification of the pulmonary arteries to the segmental level. No evidence of pulmonary embolism. Normal heart size. No pericardial effusion. Atherosclerosis of thoracic aorta is noted without aneurysm formation. Mediastinum/Nodes: No enlarged mediastinal, hilar, or axillary lymph nodes. Thyroid gland, trachea, and esophagus demonstrate no significant findings. Lungs/Pleura: No pneumothorax or pleural effusion is noted. Stable biapical scarring is noted. Aspirated material is noted in the trachea and right mainstem bronchus. Multiple ill-defined opacities are noted in both lung bases, left greater than right, concerning for multifocal pneumonia. Upper Abdomen: No acute abnormality. Musculoskeletal: No chest wall abnormality. No acute or significant osseous findings. Review of the MIP images confirms the above findings. IMPRESSION: No definite evidence of pulmonary embolus. Multiple ill-defined opacities are noted in both lung bases, left greater than right, concerning for multifocal pneumonia. Aspirated material is noted in the trachea and right mainstem bronchus. Aortic Atherosclerosis (ICD10-I70.0). Electronically Signed   By: JMarijo ConceptionM.D.   On: 03/12/2021 17:55   DG Chest Port 1 View  Result Date: 03/12/2021 CLINICAL DATA:  Weakness EXAM:  PORTABLE CHEST 1 VIEW COMPARISON:  Chest radiograph 03/11/2021 FINDINGS: The cardiomediastinal silhouette is stable. There are patchy opacities in the lateral left base which are increased since yesterday's study. There is no other focal airspace disease. There is no pleural effusion or pneumothorax. There is no acute osseous abnormality. IMPRESSION: New patchy opacities in the lateral left base may reflect infection or aspiration in the correct clinical setting. Electronically Signed   By: PValetta MoleM.D.   On: 03/12/2021 15:15        Scheduled Meds:  atorvastatin  20 mg Oral Daily   enoxaparin (LOVENOX) injection  40 mg Subcutaneous Q24H   famotidine  40 mg Oral Daily   guaiFENesin  600 mg Oral BID   insulin aspart  0-9 Units Subcutaneous TID AC & HS   ipratropium-albuterol  3 mL Nebulization QID   lisinopril  5 mg Oral Daily   pantoprazole  40 mg Oral Daily   sodium chloride flush  3 mL Intravenous Q12H   Continuous Infusions:  sodium chloride     ampicillin-sulbactam (UNASYN) IV 3 g (03/13/21 0531)   azithromycin 500 mg (03/13/21 0014)   dextrose 5 % and 0.9% NaCl       LOS: 1 day    Time spent: 33 mins     Wyvonnia Dusky, MD [facility] Pager [phone]  If 7PM-7AM, please contact night-coverage [email or web address] 03/13/2021, 7:53 AM

## 2021-03-14 DIAGNOSIS — R1312 Dysphagia, oropharyngeal phase: Secondary | ICD-10-CM

## 2021-03-14 DIAGNOSIS — D649 Anemia, unspecified: Secondary | ICD-10-CM | POA: Diagnosis not present

## 2021-03-14 DIAGNOSIS — J690 Pneumonitis due to inhalation of food and vomit: Secondary | ICD-10-CM | POA: Diagnosis not present

## 2021-03-14 LAB — BASIC METABOLIC PANEL
Anion gap: 6 (ref 5–15)
BUN: 9 mg/dL (ref 8–23)
CO2: 26 mmol/L (ref 22–32)
Calcium: 8.1 mg/dL — ABNORMAL LOW (ref 8.9–10.3)
Chloride: 106 mmol/L (ref 98–111)
Creatinine, Ser: 0.85 mg/dL (ref 0.61–1.24)
GFR, Estimated: >60 mL/min (ref >60)
Glucose, Bld: 93 mg/dL (ref 70–99)
Potassium: 3.4 mmol/L — ABNORMAL LOW (ref 3.5–5.1)
Sodium: 138 mmol/L (ref 135–145)

## 2021-03-14 LAB — CBC
HCT: 29.7 % — ABNORMAL LOW (ref 39.0–52.0)
Hemoglobin: 9.8 g/dL — ABNORMAL LOW (ref 13.0–17.0)
MCH: 27.5 pg (ref 26.0–34.0)
MCHC: 33 g/dL (ref 30.0–36.0)
MCV: 83.4 fL (ref 80.0–100.0)
Platelets: 248 10*3/uL (ref 150–400)
RBC: 3.56 MIL/uL — ABNORMAL LOW (ref 4.22–5.81)
RDW: 14.7 % (ref 11.5–15.5)
WBC: 4.1 10*3/uL (ref 4.0–10.5)
nRBC: 0 % (ref 0.0–0.2)

## 2021-03-14 LAB — GLUCOSE, CAPILLARY
Glucose-Capillary: 106 mg/dL — ABNORMAL HIGH (ref 70–99)
Glucose-Capillary: 100 mg/dL — ABNORMAL HIGH (ref 70–99)
Glucose-Capillary: 104 mg/dL — ABNORMAL HIGH (ref 70–99)
Glucose-Capillary: 130 mg/dL — ABNORMAL HIGH (ref 70–99)
Glucose-Capillary: 127 mg/dL — ABNORMAL HIGH (ref 70–99)

## 2021-03-14 MED ORDER — FAMOTIDINE IN NACL 20-0.9 MG/50ML-% IV SOLN
20.0000 mg | Freq: Two times a day (BID) | INTRAVENOUS | Status: DC
  Filled 2021-03-14 (×2): qty 50

## 2021-03-14 MED ORDER — FAMOTIDINE 20 MG IN NS 100 ML IVPB
20.0000 mg | Freq: Two times a day (BID) | INTRAVENOUS | Status: DC
  Administered 2021-03-14 – 2021-03-21 (×14): 20 mg via INTRAVENOUS
  Filled 2021-03-14 (×17): qty 100

## 2021-03-14 MED ORDER — SIMETHICONE 40 MG/0.6ML PO SUSP
40.0000 mg | Freq: Four times a day (QID) | ORAL | Status: DC | PRN
  Filled 2021-03-14: qty 0.6

## 2021-03-14 MED ORDER — POTASSIUM CHLORIDE 10 MEQ/100ML IV SOLN
10.0000 meq | INTRAVENOUS | Status: AC
  Administered 2021-03-14 (×2): 10 meq via INTRAVENOUS
  Filled 2021-03-14 (×2): qty 100

## 2021-03-14 NOTE — TOC Progression Note (Signed)
Transition of Care [facility]) - Progression Note    Patient Details  Name: Russell Housman MRN: [id number] Date of Birth: 02-23-1951  Transition of Care [facility]) CM/SW Contact  Zigmund Daniel Dorian Pod, RN Phone Number: 03/14/2021, 4:19 PM  Clinical Narrative:    PT evaluation recommend [facility]. Unsuccessful outreach to pt and/or family (Tiffany) for choice on the recommended HHPT.   TOC will continue outreach calls accordingly for the recommended HHPT.        Expected Discharge Plan and Services                                                 Social Determinants of Health (SDOH) Interventions    Readmission Risk Interventions Readmission Risk Prevention Plan 02/03/2021  Transportation Screening Complete  PCP or Specialist Appt within 3-5 Days Complete  HRI or [place] Complete  Social Work Consult for [place] Planning/Counseling Complete  Palliative Care Screening Not Applicable  Medication Review Press photographer) Complete  Some recent data might be hidden

## 2021-03-14 NOTE — Progress Notes (Signed)
PROGRESS NOTE    Louis Stone  EFE:[id number] DOB: 05-08-1951 DOA: 03/12/2021 PCP: Theotis Burrow, MD   Assessment & Plan:   Active Problems:   Aspiration pneumonia (HCC)   Likely aspiration pneumonia: continue on IV unasyn, bronchodilators & encourage incentive spirometry   Sepsis: met criteria w/ tachycardia, tachypnea and pneumonia. Continue on IVFs & IV abxs. Blood cxs NGTD  DM2: likely well controlled. W/ Hypoglycemic episodes. Continue on IV D5NS. Resolved   Normocytic anemia: H&H are labile. Will transfuse if Hb <7.0  HLD: continue on statin   HTN: continue on ACE-I   GERD: continue on PPI  Oropharyngeal squamous cell carcinoma: stage IV. Management as per oncology  Dysphagia: likely secondary to hx of oropharyngeal squamous cell carcinoma. Swallow study shows severe oropharyngeal phase dysphagia w/ aspiration with liquids. Discussed case w/ ENT, Dr. Freda Munro, and pt was seen in May by Dr. 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 Not surprising pt has swallowing issues after treatment as per ENT. May need a PEG tube. Continue NPO today. Discussed findings of swallow study, NPO status & possible placement of NG tube and if NG tube is unsuccessful/unable to place, possible PEG tube placement.    DVT prophylaxis: lovenox  Code Status: full  Family Communication: Disposition Plan: likely d/c back home   Level of care: Med-Surg  Status is: Inpatient  Remains inpatient appropriate because:Unsafe d/c plan, IV treatments appropriate due to intensity of illness or inability to take PO, and Inpatient level of care appropriate due to severity of illness  Dispo: The patient is from: Home              Anticipated d/c is to: Home              Patient currently is not medically stable to d/c.   Difficult to place patient Yes    Consultants:   ENT  Procedures:   Antimicrobials: unasyn    Subjective: Pt c/o malaise   Objective: Vitals:   03/13/21 1637 03/13/21 2040 03/13/21 2300 03/14/21 0500  BP: 122/77 105/70 117/74 (!) 91/58  Pulse: 60 62 64 (!) 54  Resp: 16 18 18 18   Temp: (!) 97.4 F (36.3 C) 98.2 F (36.8 C) 98.3 F (36.8 C) 98.4 F (36.9 C)  TempSrc:      SpO2: 98% 99% 98% 95%  Weight:      Height:        Intake/Output Summary (Last 24 hours) at 03/14/2021 0805 Last data filed at 03/14/2021 0500 Gross per 24 hour  Intake 300 ml  Output 600 ml  Net -300 ml   Filed Weights   03/12/21 1227 03/12/21 2105  Weight: 70 kg 54.3 kg    Examination:  General exam: Appears calm & uncomfortable. Disheveled Respiratory system: decreased breath sounds b/l  Cardiovascular system: S1/S2+. No rubs or clicks  Gastrointestinal system: Abd is soft, NT, ND & hypoactive bowel sounds. Central nervous system: Alert and oriented. Moves all extremities   Psychiatry: Judgement and insight appear poor. Flat mood and affect    Data Reviewed: I have personally reviewed following labs and imaging studies  CBC: Recent Labs  Lab 03/11/21 1157 03/12/21 1224 03/13/21 0515 03/14/21 0550  WBC 8.2 8.2 8.9 4.1  NEUTROABS 7.5 7.5  --   --   HGB 11.9* 12.4* 10.6* 9.8*  HCT 35.1* 37.8* 31.6*  29.7*  MCV 81.6 81.1 82.1 83.4  PLT 274 304 259 410   Basic Metabolic Panel: Recent Labs  Lab 03/11/21 1157 03/12/21 1224 03/13/21 0515 03/14/21 0550  NA 133* 133* 132* 138  K 4.4 4.0 3.8 3.4*  CL 97* 98 99 106  CO2 24 25 26 26   GLUCOSE 78 51* 57* 93  BUN 25* 22 18 9   CREATININE 0.97 1.19 0.94 0.85  CALCIUM 9.4 9.2 8.5* 8.1*   GFR: Estimated Creatinine Clearance: 63.9 mL/min (by C-G formula based on SCr of 0.85 mg/dL). Liver Function Tests: Recent Labs  Lab 03/11/21 1157 03/12/21 1224  AST 26 28  ALT 17 14  ALKPHOS 76 81  BILITOT 0.9 0.9  PROT 8.4* 8.4*  ALBUMIN 3.9 3.5   No results for input(s): LIPASE,  AMYLASE in the last 168 hours. No results for input(s): AMMONIA in the last 168 hours. Coagulation Profile: Recent Labs  Lab 03/11/21 1157 03/12/21 2242  INR 1.1 1.2   Cardiac Enzymes: No results for input(s): CKTOTAL, CKMB, CKMBINDEX, TROPONINI in the last 168 hours. BNP (last 3 results) No results for input(s): PROBNP in the last 8760 hours. HbA1C: No results for input(s): HGBA1C in the last 72 hours. CBG: Recent Labs  Lab 03/13/21 2107 03/13/21 2111 03/13/21 2113 03/13/21 2146 03/14/21 0605  GLUCAP 72 56* 49* 176* 106*   Lipid Profile: No results for input(s): CHOL, HDL, LDLCALC, TRIG, CHOLHDL, LDLDIRECT in the last 72 hours. Thyroid Function Tests: No results for input(s): TSH, T4TOTAL, FREET4, T3FREE, THYROIDAB in the last 72 hours. Anemia Panel: No results for input(s): VITAMINB12, FOLATE, FERRITIN, TIBC, IRON, RETICCTPCT in the last 72 hours. Sepsis Labs: Recent Labs  Lab 03/12/21 1500 03/12/21 2242 03/13/21 0515  PROCALCITON 0.16  --   --   LATICACIDVEN  --  2.1* 1.3    Recent Results (from the past 240 hour(s))  Resp Panel by RT-PCR (Flu A&B, Covid) Nasopharyngeal Swab     Status: None   Collection Time: 03/11/21 12:50 PM   Specimen: Nasopharyngeal Swab; Nasopharyngeal(NP) swabs in vial transport medium  Result Value Ref Range Status   SARS Coronavirus 2 by RT PCR NEGATIVE NEGATIVE Final    Comment: (NOTE) SARS-CoV-2 target nucleic acids are NOT DETECTED.  The SARS-CoV-2 RNA is generally detectable in upper respiratory specimens during the acute phase of infection. The lowest concentration of SARS-CoV-2 viral copies this assay can detect is 138 copies/mL. A negative result does not preclude SARS-Cov-2 infection and should not be used as the sole basis for treatment or other patient management decisions. A negative result may occur with  improper specimen collection/handling, submission of specimen other than nasopharyngeal swab, presence of viral  mutation(s) within the areas targeted by this assay, and inadequate number of viral copies(<138 copies/mL). A negative result must be combined with clinical observations, patient history, and epidemiological information. The expected result is Negative.  Fact Sheet for Patients:  EntrepreneurPulse.com.au  Fact Sheet for Healthcare Providers:  IncredibleEmployment.be  This test is no t yet approved or cleared by the [place] FDA and  has been authorized for detection and/or diagnosis of SARS-CoV-2 by FDA under an Emergency Use Authorization (EUA). This EUA will remain  in effect (meaning this test can be used) for the duration of the COVID-19 declaration under Section 564(b)(1) of the Act, 21 U.S.C.section 360bbb-3(b)(1), unless the authorization is terminated  or revoked sooner.       Influenza A by PCR NEGATIVE NEGATIVE Final   Influenza B  by PCR NEGATIVE NEGATIVE Final    Comment: (NOTE) The Xpert Xpress SARS-CoV-2/FLU/RSV plus assay is intended as an aid in the diagnosis of influenza from Nasopharyngeal swab specimens and should not be used as a sole basis for treatment. Nasal washings and aspirates are unacceptable for Xpert Xpress SARS-CoV-2/FLU/RSV testing.  Fact Sheet for Patients: EntrepreneurPulse.com.au  Fact Sheet for Healthcare Providers: IncredibleEmployment.be  This test is not yet approved or cleared by the [place] FDA and has been authorized for detection and/or diagnosis of SARS-CoV-2 by FDA under an Emergency Use Authorization (EUA). This EUA will remain in effect (meaning this test can be used) for the duration of the COVID-19 declaration under Section 564(b)(1) of the Act, 21 U.S.C. section 360bbb-3(b)(1), unless the authorization is terminated or revoked.  Performed at [facility], [address] [place], [place] [address]   Resp Panel by RT-PCR (Flu A&B,  Covid) Nasopharyngeal Swab     Status: None   Collection Time: 03/12/21  3:00 PM   Specimen: Nasopharyngeal Swab; Nasopharyngeal(NP) swabs in vial transport medium  Result Value Ref Range Status   SARS Coronavirus 2 by RT PCR NEGATIVE NEGATIVE Final    Comment: (NOTE) SARS-CoV-2 target nucleic acids are NOT DETECTED.  The SARS-CoV-2 RNA is generally detectable in upper respiratory specimens during the acute phase of infection. The lowest concentration of SARS-CoV-2 viral copies this assay can detect is 138 copies/mL. A negative result does not preclude SARS-Cov-2 infection and should not be used as the sole basis for treatment or other patient management decisions. A negative result may occur with  improper specimen collection/handling, submission of specimen other than nasopharyngeal swab, presence of viral mutation(s) within the areas targeted by this assay, and inadequate number of viral copies(<138 copies/mL). A negative result must be combined with clinical observations, patient history, and epidemiological information. The expected result is Negative.  Fact Sheet for Patients:  EntrepreneurPulse.com.au  Fact Sheet for Healthcare Providers:  IncredibleEmployment.be  This test is no t yet approved or cleared by the [place] FDA and  has been authorized for detection and/or diagnosis of SARS-CoV-2 by FDA under an Emergency Use Authorization (EUA). This EUA will remain  in effect (meaning this test can be used) for the duration of the COVID-19 declaration under Section 564(b)(1) of the Act, 21 U.S.C.section 360bbb-3(b)(1), unless the authorization is terminated  or revoked sooner.       Influenza A by PCR NEGATIVE NEGATIVE Final   Influenza B by PCR NEGATIVE NEGATIVE Final    Comment: (NOTE) The Xpert Xpress SARS-CoV-2/FLU/RSV plus assay is intended as an aid in the diagnosis of influenza from Nasopharyngeal swab specimens and should  not be used as a sole basis for treatment. Nasal washings and aspirates are unacceptable for Xpert Xpress SARS-CoV-2/FLU/RSV testing.  Fact Sheet for Patients: EntrepreneurPulse.com.au  Fact Sheet for Healthcare Providers: IncredibleEmployment.be  This test is not yet approved or cleared by the [place] FDA and has been authorized for detection and/or diagnosis of SARS-CoV-2 by FDA under an Emergency Use Authorization (EUA). This EUA will remain in effect (meaning this test can be used) for the duration of the COVID-19 declaration under Section 564(b)(1) of the Act, 21 U.S.C. section 360bbb-3(b)(1), unless the authorization is terminated or revoked.  Performed at [facility], [address] [place], [place] [address]   Culture, blood (Routine X 2) w Reflex to ID Panel     Status: None (Preliminary result)   Collection Time: 03/12/21 10:42 PM  Specimen: BLOOD  Result Value Ref Range Status   Specimen Description BLOOD LEFT FOREARM  Final   Special Requests   Final    BOTTLES DRAWN AEROBIC AND ANAEROBIC Blood Culture adequate volume   Culture   Final    NO GROWTH 2 DAYS Performed at [facility], [address] [place], [place] [address]    Report Status PENDING  Incomplete  Culture, blood (Routine X 2) w Reflex to ID Panel     Status: None (Preliminary result)   Collection Time: 03/12/21 10:44 PM   Specimen: BLOOD  Result Value Ref Range Status   Specimen Description BLOOD LEFT ASSIST CONTROL  Final   Special Requests   Final    BOTTLES DRAWN AEROBIC ONLY Blood Culture adequate volume   Culture   Final    NO GROWTH 2 DAYS Performed at [facility], [address] [place], [place] [address]    Report Status PENDING  Incomplete         Radiology Studies: CT Angio Chest PE W and/or Wo Contrast  Result Date: 03/12/2021 CLINICAL DATA:  Positive D-dimer. EXAM: CT ANGIOGRAPHY CHEST WITH CONTRAST  TECHNIQUE: Multidetector CT imaging of the chest was performed using the standard protocol during bolus administration of intravenous contrast. Multiplanar CT image reconstructions and MIPs were obtained to evaluate the vascular anatomy. CONTRAST:  2m OMNIPAQUE IOHEXOL 350 MG/ML SOLN COMPARISON:  October 08, 2015. FINDINGS: Cardiovascular: Satisfactory opacification of the pulmonary arteries to the segmental level. No evidence of pulmonary embolism. Normal heart size. No pericardial effusion. Atherosclerosis of thoracic aorta is noted without aneurysm formation. Mediastinum/Nodes: No enlarged mediastinal, hilar, or axillary lymph nodes. Thyroid gland, trachea, and esophagus demonstrate no significant findings. Lungs/Pleura: No pneumothorax or pleural effusion is noted. Stable biapical scarring is noted. Aspirated material is noted in the trachea and right mainstem bronchus. Multiple ill-defined opacities are noted in both lung bases, left greater than right, concerning for multifocal pneumonia. Upper Abdomen: No acute abnormality. Musculoskeletal: No chest wall abnormality. No acute or significant osseous findings. Review of the MIP images confirms the above findings. IMPRESSION: No definite evidence of pulmonary embolus. Multiple ill-defined opacities are noted in both lung bases, left greater than right, concerning for multifocal pneumonia. Aspirated material is noted in the trachea and right mainstem bronchus. Aortic Atherosclerosis (ICD10-I70.0). Electronically Signed   By: JMarijo ConceptionM.D.   On: 03/12/2021 17:55   DG Chest Port 1 View  Result Date: 03/12/2021 CLINICAL DATA:  Weakness EXAM: PORTABLE CHEST 1 VIEW COMPARISON:  Chest radiograph 03/11/2021 FINDINGS: The cardiomediastinal silhouette is stable. There are patchy opacities in the lateral left base which are increased since yesterday's study. There is no other focal airspace disease. There is no pleural effusion or pneumothorax. There is no acute  osseous abnormality. IMPRESSION: New patchy opacities in the lateral left base may reflect infection or aspiration in the correct clinical setting. Electronically Signed   By: PValetta MoleM.D.   On: 03/12/2021 15:15        Scheduled Meds:  atorvastatin  20 mg Oral Daily   enoxaparin (LOVENOX) injection  40 mg Subcutaneous Q24H   famotidine  40 mg Oral Daily   guaiFENesin  600 mg Oral BID   insulin aspart  0-9 Units Subcutaneous TID AC & HS   lisinopril  5 mg Oral Daily   pantoprazole  40 mg Oral Daily   sodium chloride flush  3 mL Intravenous Q12H   Continuous Infusions:  sodium  chloride     ampicillin-sulbactam (UNASYN) IV 3 g (03/14/21 0533)   azithromycin 500 mg (03/13/21 2039)   dextrose 5 % and 0.9% NaCl 75 mL/hr at 03/13/21 1632     LOS: 2 days    Time spent: 30 mins     Wyvonnia Dusky, MD [facility] Pager [phone]  If 7PM-7AM, please contact night-coverage [email or web address] 03/14/2021, 8:05 AM

## 2021-03-14 NOTE — Progress Notes (Signed)
Pt's blood sugar checked, with multiple readings from different fingers. Pt states this happens at home.  CBG Readings: 2105- 56 2107- 72 2111- 56 2113- 49   Due to patients NPO status, D50 given and blood glucose came up to 176 at 2146. Will continue to monitor CBG's throughout the night.

## 2021-03-15 DIAGNOSIS — E1169 Type 2 diabetes mellitus with other specified complication: Secondary | ICD-10-CM | POA: Diagnosis not present

## 2021-03-15 DIAGNOSIS — J690 Pneumonitis due to inhalation of food and vomit: Secondary | ICD-10-CM | POA: Diagnosis not present

## 2021-03-15 DIAGNOSIS — R1312 Dysphagia, oropharyngeal phase: Secondary | ICD-10-CM | POA: Diagnosis not present

## 2021-03-15 LAB — BASIC METABOLIC PANEL
Anion gap: 4 — ABNORMAL LOW (ref 5–15)
BUN: 6 mg/dL — ABNORMAL LOW (ref 8–23)
CO2: 25 mmol/L (ref 22–32)
Calcium: 7.9 mg/dL — ABNORMAL LOW (ref 8.9–10.3)
Chloride: 110 mmol/L (ref 98–111)
Creatinine, Ser: 0.82 mg/dL (ref 0.61–1.24)
GFR, Estimated: >60 mL/min (ref >60)
Glucose, Bld: 153 mg/dL — ABNORMAL HIGH (ref 70–99)
Potassium: 3.6 mmol/L (ref 3.5–5.1)
Sodium: 139 mmol/L (ref 135–145)

## 2021-03-15 LAB — GLUCOSE, CAPILLARY
Glucose-Capillary: 137 mg/dL — ABNORMAL HIGH (ref 70–99)
Glucose-Capillary: 126 mg/dL — ABNORMAL HIGH (ref 70–99)
Glucose-Capillary: 119 mg/dL — ABNORMAL HIGH (ref 70–99)
Glucose-Capillary: 131 mg/dL — ABNORMAL HIGH (ref 70–99)

## 2021-03-15 LAB — CBC
HCT: 30.1 % — ABNORMAL LOW (ref 39.0–52.0)
Hemoglobin: 9.8 g/dL — ABNORMAL LOW (ref 13.0–17.0)
MCH: 27 pg (ref 26.0–34.0)
MCHC: 32.6 g/dL (ref 30.0–36.0)
MCV: 82.9 fL (ref 80.0–100.0)
Platelets: 264 10*3/uL (ref 150–400)
RBC: 3.63 MIL/uL — ABNORMAL LOW (ref 4.22–5.81)
RDW: 14.4 % (ref 11.5–15.5)
WBC: 3.9 10*3/uL — ABNORMAL LOW (ref 4.0–10.5)
nRBC: 0 % (ref 0.0–0.2)

## 2021-03-15 MED ORDER — PANTOPRAZOLE SODIUM 40 MG IV SOLR
40.0000 mg | Freq: Every day | INTRAVENOUS | Status: DC
  Administered 2021-03-15 – 2021-03-21 (×7): 40 mg via INTRAVENOUS
  Filled 2021-03-15 (×7): qty 40

## 2021-03-15 NOTE — Progress Notes (Signed)
PROGRESS NOTE    Louis Stone  BOF:[id number] DOB: 1951/03/30 DOA: 03/12/2021 PCP: Theotis Burrow, MD   Assessment & Plan:   Active Problems:   Aspiration pneumonia (HCC)   Likely aspiration pneumonia: continue on IV unasyn, bronchodilators & encourage incentive spirometry    Sepsis: met criteria w/ tachycardia, tachypnea and pneumonia. Continue on IVFs & IV abxs. Blood cxs NGTD. Resolved   DM2: likely well controlled. W/ Hypoglycemic episodes. Continue on IV D5NS.  Normocytic anemia: H&H are stable. No need for a transfusion currently   HLD: continue on statin   HTN: continue on lisinopril   GERD: continue on PPI   Oropharyngeal squamous cell carcinoma: stage IV. Management as per oncology  Dysphagia: likely secondary to hx of oropharyngeal squamous cell carcinoma. Swallow study shows severe oropharyngeal phase dysphagia w/ aspiration with liquids. Discussed case w/ ENT, Dr. Freda Munro, and pt was seen in May by Dr. 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 Not surprising pt has swallowing issues after treatment as per ENT. May need a PEG tube. Continue NPO today. Discussed findings of swallow study, NPO status & possible placement of NG tube and if NG tube is unsuccessful/unable to place, possible PEG tube placement. Hopefully speech can re-evaluate pt tomorrow    DVT prophylaxis: lovenox  Code Status: full  Family Communication: Disposition Plan: likely d/c back home   Level of care: Med-Surg  Status is: Inpatient  Remains inpatient appropriate because:Unsafe d/c plan, IV treatments appropriate due to intensity of illness or inability to take PO, and Inpatient level of care appropriate due to severity of illness  Dispo: The patient is from: Home              Anticipated d/c is to: Home              Patient currently is not medically stable to  d/c.   Difficult to place patient Yes    Consultants:  ENT  Procedures:   Antimicrobials: unasyn    Subjective: Pt c/o fatigue   Objective: Vitals:   03/14/21 1700 03/14/21 2042 03/15/21 0025 03/15/21 0504  BP: 136/79 (!) 153/80 139/75 116/62  Pulse: 61 65 71 60  Resp: 17 16 16 16   Temp: (!) 97.2 F (36.2 C) 97.9 F (36.6 C) 98.2 F (36.8 C) 98.5 F (36.9 C)  TempSrc:      SpO2: 100% 98% 97% 97%  Weight:      Height:        Intake/Output Summary (Last 24 hours) at 03/15/2021 0738 Last data filed at 03/15/2021 0506 Gross per 24 hour  Intake --  Output 1275 ml  Net -1275 ml   Filed Weights   03/12/21 1227 03/12/21 2105  Weight: 70 kg 54.3 kg    Examination:  General exam: Appears frustrated. Disheveled & frail appearing  Respiratory system: diminished breath sounds b/l  Cardiovascular system: S1 & S2+. No rubs or gallops Gastrointestinal system: Abd is soft, NT, ND & hypoactive bowel sounds Central nervous system: Alert and oriented. Moves all extremities  Psychiatry: Judgement and insight appear poor. Frustrated mood     Data Reviewed: I have personally reviewed following labs and imaging studies  CBC: Recent Labs  Lab 03/11/21 1157 03/12/21 1224 03/13/21 0515 03/14/21 0550 03/15/21 0509  WBC 8.2 8.2 8.9 4.1 3.9*  NEUTROABS 7.5 7.5  --   --   --  HGB 11.9* 12.4* 10.6* 9.8* 9.8*  HCT 35.1* 37.8* 31.6* 29.7* 30.1*  MCV 81.6 81.1 82.1 83.4 82.9  PLT 274 304 259 248 574   Basic Metabolic Panel: Recent Labs  Lab 03/11/21 1157 03/12/21 1224 03/13/21 0515 03/14/21 0550 03/15/21 0509  NA 133* 133* 132* 138 139  K 4.4 4.0 3.8 3.4* 3.6  CL 97* 98 99 106 110  CO2 24 25 26 26 25   GLUCOSE 78 51* 57* 93 153*  BUN 25* 22 18 9  6*  CREATININE 0.97 1.19 0.94 0.85 0.82  CALCIUM 9.4 9.2 8.5* 8.1* 7.9*   GFR: Estimated Creatinine Clearance: 66.2 mL/min (by C-G formula based on SCr of 0.82 mg/dL). Liver Function Tests: Recent Labs  Lab  03/11/21 1157 03/12/21 1224  AST 26 28  ALT 17 14  ALKPHOS 76 81  BILITOT 0.9 0.9  PROT 8.4* 8.4*  ALBUMIN 3.9 3.5   No results for input(s): LIPASE, AMYLASE in the last 168 hours. No results for input(s): AMMONIA in the last 168 hours. Coagulation Profile: Recent Labs  Lab 03/11/21 1157 03/12/21 2242  INR 1.1 1.2   Cardiac Enzymes: No results for input(s): CKTOTAL, CKMB, CKMBINDEX, TROPONINI in the last 168 hours. BNP (last 3 results) No results for input(s): PROBNP in the last 8760 hours. HbA1C: No results for input(s): HGBA1C in the last 72 hours. CBG: Recent Labs  Lab 03/14/21 0605 03/14/21 0858 03/14/21 1201 03/14/21 1630 03/14/21 2148  GLUCAP 106* 100* 104* 130* 127*   Lipid Profile: No results for input(s): CHOL, HDL, LDLCALC, TRIG, CHOLHDL, LDLDIRECT in the last 72 hours. Thyroid Function Tests: No results for input(s): TSH, T4TOTAL, FREET4, T3FREE, THYROIDAB in the last 72 hours. Anemia Panel: No results for input(s): VITAMINB12, FOLATE, FERRITIN, TIBC, IRON, RETICCTPCT in the last 72 hours. Sepsis Labs: Recent Labs  Lab 03/12/21 1500 03/12/21 2242 03/13/21 0515  PROCALCITON 0.16  --   --   LATICACIDVEN  --  2.1* 1.3    Recent Results (from the past 240 hour(s))  Resp Panel by RT-PCR (Flu A&B, Covid) Nasopharyngeal Swab     Status: None   Collection Time: 03/11/21 12:50 PM   Specimen: Nasopharyngeal Swab; Nasopharyngeal(NP) swabs in vial transport medium  Result Value Ref Range Status   SARS Coronavirus 2 by RT PCR NEGATIVE NEGATIVE Final    Comment: (NOTE) SARS-CoV-2 target nucleic acids are NOT DETECTED.  The SARS-CoV-2 RNA is generally detectable in upper respiratory specimens during the acute phase of infection. The lowest concentration of SARS-CoV-2 viral copies this assay can detect is 138 copies/mL. A negative result does not preclude SARS-Cov-2 infection and should not be used as the sole basis for treatment or other patient management  decisions. A negative result may occur with  improper specimen collection/handling, submission of specimen other than nasopharyngeal swab, presence of viral mutation(s) within the areas targeted by this assay, and inadequate number of viral copies(<138 copies/mL). A negative result must be combined with clinical observations, patient history, and epidemiological information. The expected result is Negative.  Fact Sheet for Patients:  EntrepreneurPulse.com.au  Fact Sheet for Healthcare Providers:  IncredibleEmployment.be  This test is no t yet approved or cleared by the [place] FDA and  has been authorized for detection and/or diagnosis of SARS-CoV-2 by FDA under an Emergency Use Authorization (EUA). This EUA will remain  in effect (meaning this test can be used) for the duration of the COVID-19 declaration under Section 564(b)(1) of the Act, 21 U.S.C.section 360bbb-3(b)(1), unless the  authorization is terminated  or revoked sooner.       Influenza A by PCR NEGATIVE NEGATIVE Final   Influenza B by PCR NEGATIVE NEGATIVE Final    Comment: (NOTE) The Xpert Xpress SARS-CoV-2/FLU/RSV plus assay is intended as an aid in the diagnosis of influenza from Nasopharyngeal swab specimens and should not be used as a sole basis for treatment. Nasal washings and aspirates are unacceptable for Xpert Xpress SARS-CoV-2/FLU/RSV testing.  Fact Sheet for Patients: EntrepreneurPulse.com.au  Fact Sheet for Healthcare Providers: IncredibleEmployment.be  This test is not yet approved or cleared by the [place] FDA and has been authorized for detection and/or diagnosis of SARS-CoV-2 by FDA under an Emergency Use Authorization (EUA). This EUA will remain in effect (meaning this test can be used) for the duration of the COVID-19 declaration under Section 564(b)(1) of the Act, 21 U.S.C. section 360bbb-3(b)(1), unless the  authorization is terminated or revoked.  Performed at [facility], [address] [place], [place] [address]   Resp Panel by RT-PCR (Flu A&B, Covid) Nasopharyngeal Swab     Status: None   Collection Time: 03/12/21  3:00 PM   Specimen: Nasopharyngeal Swab; Nasopharyngeal(NP) swabs in vial transport medium  Result Value Ref Range Status   SARS Coronavirus 2 by RT PCR NEGATIVE NEGATIVE Final    Comment: (NOTE) SARS-CoV-2 target nucleic acids are NOT DETECTED.  The SARS-CoV-2 RNA is generally detectable in upper respiratory specimens during the acute phase of infection. The lowest concentration of SARS-CoV-2 viral copies this assay can detect is 138 copies/mL. A negative result does not preclude SARS-Cov-2 infection and should not be used as the sole basis for treatment or other patient management decisions. A negative result may occur with  improper specimen collection/handling, submission of specimen other than nasopharyngeal swab, presence of viral mutation(s) within the areas targeted by this assay, and inadequate number of viral copies(<138 copies/mL). A negative result must be combined with clinical observations, patient history, and epidemiological information. The expected result is Negative.  Fact Sheet for Patients:  EntrepreneurPulse.com.au  Fact Sheet for Healthcare Providers:  IncredibleEmployment.be  This test is no t yet approved or cleared by the [place] FDA and  has been authorized for detection and/or diagnosis of SARS-CoV-2 by FDA under an Emergency Use Authorization (EUA). This EUA will remain  in effect (meaning this test can be used) for the duration of the COVID-19 declaration under Section 564(b)(1) of the Act, 21 U.S.C.section 360bbb-3(b)(1), unless the authorization is terminated  or revoked sooner.       Influenza A by PCR NEGATIVE NEGATIVE Final   Influenza B by PCR NEGATIVE NEGATIVE Final     Comment: (NOTE) The Xpert Xpress SARS-CoV-2/FLU/RSV plus assay is intended as an aid in the diagnosis of influenza from Nasopharyngeal swab specimens and should not be used as a sole basis for treatment. Nasal washings and aspirates are unacceptable for Xpert Xpress SARS-CoV-2/FLU/RSV testing.  Fact Sheet for Patients: EntrepreneurPulse.com.au  Fact Sheet for Healthcare Providers: IncredibleEmployment.be  This test is not yet approved or cleared by the [place] FDA and has been authorized for detection and/or diagnosis of SARS-CoV-2 by FDA under an Emergency Use Authorization (EUA). This EUA will remain in effect (meaning this test can be used) for the duration of the COVID-19 declaration under Section 564(b)(1) of the Act, 21 U.S.C. section 360bbb-3(b)(1), unless the authorization is terminated or revoked.  Performed at [facility], [address] [place], [place] [address]   Culture, blood (  Routine X 2) w Reflex to ID Panel     Status: None (Preliminary result)   Collection Time: 03/12/21 10:42 PM   Specimen: BLOOD  Result Value Ref Range Status   Specimen Description BLOOD LEFT FOREARM  Final   Special Requests   Final    BOTTLES DRAWN AEROBIC AND ANAEROBIC Blood Culture adequate volume   Culture   Final    NO GROWTH 3 DAYS Performed at [facility], [address] [place], [place] [address]    Report Status PENDING  Incomplete  Culture, blood (Routine X 2) w Reflex to ID Panel     Status: None (Preliminary result)   Collection Time: 03/12/21 10:44 PM   Specimen: BLOOD  Result Value Ref Range Status   Specimen Description BLOOD LEFT ASSIST CONTROL  Final   Special Requests   Final    BOTTLES DRAWN AEROBIC ONLY Blood Culture adequate volume   Culture   Final    NO GROWTH 3 DAYS Performed at [facility], [address], [place], [place] [address]    Report Status PENDING  Incomplete          Radiology Studies: No results found.      Scheduled Meds:  atorvastatin  20 mg Oral Daily   enoxaparin (LOVENOX) injection  40 mg Subcutaneous Q24H   famotidine (PEPCID) IV  20 mg Intravenous Q12H   guaiFENesin  600 mg Oral BID   insulin aspart  0-9 Units Subcutaneous TID AC & HS   lisinopril  5 mg Oral Daily   pantoprazole  40 mg Oral Daily   sodium chloride flush  3 mL Intravenous Q12H   Continuous Infusions:  sodium chloride     ampicillin-sulbactam (UNASYN) IV 3 g (03/15/21 0517)   azithromycin 500 mg (03/14/21 2039)   dextrose 5 % and 0.9% NaCl 100 mL/hr at 03/14/21 2304     LOS: 3 days    Time spent: 20 mins     Wyvonnia Dusky, MD [facility] Pager [phone]  If 7PM-7AM, please contact night-coverage [email or web address] 03/15/2021, 7:38 AM

## 2021-03-16 ENCOUNTER — Encounter: Payer: Medicare Other | Admitting: Speech Pathology

## 2021-03-16 DIAGNOSIS — J690 Pneumonitis due to inhalation of food and vomit: Secondary | ICD-10-CM | POA: Diagnosis not present

## 2021-03-16 DIAGNOSIS — R1312 Dysphagia, oropharyngeal phase: Secondary | ICD-10-CM | POA: Diagnosis not present

## 2021-03-16 DIAGNOSIS — E1169 Type 2 diabetes mellitus with other specified complication: Secondary | ICD-10-CM | POA: Diagnosis not present

## 2021-03-16 LAB — CBC
HCT: 30.1 % — ABNORMAL LOW (ref 39.0–52.0)
Hemoglobin: 10 g/dL — ABNORMAL LOW (ref 13.0–17.0)
MCH: 27.7 pg (ref 26.0–34.0)
MCHC: 33.2 g/dL (ref 30.0–36.0)
MCV: 83.4 fL (ref 80.0–100.0)
Platelets: 257 10*3/uL (ref 150–400)
RBC: 3.61 MIL/uL — ABNORMAL LOW (ref 4.22–5.81)
RDW: 14.3 % (ref 11.5–15.5)
WBC: 4.3 10*3/uL (ref 4.0–10.5)
nRBC: 0 % (ref 0.0–0.2)

## 2021-03-16 LAB — BASIC METABOLIC PANEL
Anion gap: 3 — ABNORMAL LOW (ref 5–15)
BUN: 5 mg/dL — ABNORMAL LOW (ref 8–23)
CO2: 26 mmol/L (ref 22–32)
Calcium: 8.1 mg/dL — ABNORMAL LOW (ref 8.9–10.3)
Chloride: 112 mmol/L — ABNORMAL HIGH (ref 98–111)
Creatinine, Ser: 0.89 mg/dL (ref 0.61–1.24)
GFR, Estimated: >60 mL/min (ref >60)
Glucose, Bld: 137 mg/dL — ABNORMAL HIGH (ref 70–99)
Potassium: 3.6 mmol/L (ref 3.5–5.1)
Sodium: 141 mmol/L (ref 135–145)

## 2021-03-16 LAB — GLUCOSE, CAPILLARY
Glucose-Capillary: 154 mg/dL — ABNORMAL HIGH (ref 70–99)
Glucose-Capillary: 136 mg/dL — ABNORMAL HIGH (ref 70–99)
Glucose-Capillary: 137 mg/dL — ABNORMAL HIGH (ref 70–99)
Glucose-Capillary: 115 mg/dL — ABNORMAL HIGH (ref 70–99)

## 2021-03-16 LAB — STREP PNEUMONIAE URINARY ANTIGEN: Strep Pneumo Urinary Antigen: NEGATIVE

## 2021-03-16 MED ORDER — INSULIN ASPART 100 UNIT/ML IJ SOLN: 0.0000 [IU] | INTRAMUSCULAR | Status: DC

## 2021-03-16 NOTE — Progress Notes (Signed)
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 Swallow study shows severe oropharyngeal phase dysphagia w/ aspiration with liquids. Discussed case w/ ENT, Dr. Freda Munro, and pt was seen in May by Dr. 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 Not surprising pt has swallowing issues after treatment as per ENT. May need a PEG tube. Continue NPO today. Discussed findings of swallow study, NPO status & possible placement of NG tube and if NG tube is unsuccessful/unable to place, possible PEG tube placement. Dicussed w/ speech again today and they still recommend alternate form of feedings. Called pt's stepdaughter, Louis Stone, who will speak with the pt about his options and I will reach out to [facility] again tomorrow. Palliative care consulted   Sepsis: met criteria w/ tachycardia, tachypnea and pneumonia. Continue on IVFs & IV abxs. Blood cxs NGTD. Resolved   DM2: likely well controlled. W/ hypoglycemic episodes. Continue on IV D5NS.    Normocytic anemia: H&H are labile. No need for a transfusion currently   HLD: continue on statin   HTN: continue on ACE-I   GERD: continue on PPI   Oropharyngeal squamous cell carcinoma: stage IV. Management as per oncology    DVT prophylaxis: lovenox  Code Status: full  Family Communication: dicussed pt's care w/ pt's stepdaughter, Louis Stone, and answered his questions Disposition Plan: likely d/c back home   Level of care: Med-Surg  Status is: Inpatient  Remains inpatient appropriate because:Unsafe d/c plan, IV treatments  appropriate due to intensity of illness or inability to take PO, and Inpatient level of care appropriate due to severity of illness  Dispo: The patient is from: Home              Anticipated d/c is to: Home              Patient currently is not medically stable to d/c.   Difficult to place patient Yes    Consultants:  Palliative care   Procedures:   Antimicrobials: unasyn    Subjective: Pt c/o not being able to eat   Objective: Vitals:   03/15/21 1126 03/15/21 1608 03/15/21 2138 03/16/21 0522  BP: (!) 110/59 134/86 127/71 109/65  Pulse: (!) 51 61 63 (!) 59  Resp: _0 Temp: 98.1 F (36.7 C) 98.2 F (36.8 C) 98.2 F (36.8 C) 98.5 F (36.9 C)  TempSrc:      SpO2: 96% 99% 98% 93%  Weight:      Height:        Intake/Output Summary (Last 24 hours) at 03/16/2021 0741 Last data filed at 03/16/2021 0602 Gross per 24 hour  Intake 7559.95 ml  Output 625 ml  Net 6934.95 ml   Filed Weights   03/12/21 1227 03/12/21 2105  Weight: 70 kg 54.3 kg    Examination:  General exam: Appears frustrated. Disheveled & appears older than stated age Respiratory system: decreased breath sounds b/l Cardiovascular system: S1 & S2+. No rubs or clicks  Gastrointestinal system: Abd is soft, NT, ND & hypoactive bowel sounds  Central nervous system: Alert and oriented. Moves all  extremities   Psychiatry: Judgement and insight appear poor. Frustrated and agitated     Data Reviewed: I have personally reviewed following labs and imaging studies  CBC: Recent Labs  Lab 03/11/21 1157 03/12/21 1224 03/13/21 0515 03/14/21 0550 03/15/21 0509 03/16/21 0509  WBC 8.2 8.2 8.9 4.1 3.9* 4.3  NEUTROABS 7.5 7.5  --   --   --   --   HGB 11.9* 12.4* 10.6* 9.8* 9.8* 10.0*  HCT 35.1* 37.8* 31.6* 29.7* 30.1* 30.1*  MCV 81.6 81.1 82.1 83.4 82.9 83.4  PLT 274 304 259 248 264 101   Basic Metabolic Panel: Recent Labs  Lab 03/12/21 1224 03/13/21 0515 03/14/21 0550 03/15/21 0509  03/16/21 0509  NA 133* 132* 138 139 141  K 4.0 3.8 3.4* 3.6 3.6  CL 98 99 106 110 112*  CO2 _0 GLUCOSE 51* 57* 93 153* 137*  BUN _1 6* 5*  CREATININE 1.19 0.94 0.85 0.82 0.89  CALCIUM 9.2 8.5* 8.1* 7.9* 8.1*   GFR: Estimated Creatinine Clearance: 61 mL/min (by C-G formula based on SCr of 0.89 mg/dL). Liver Function Tests: Recent Labs  Lab 03/11/21 1157 03/12/21 1224  AST 26 28  ALT 17 14  ALKPHOS 76 81  BILITOT 0.9 0.9  PROT 8.4* 8.4*  ALBUMIN 3.9 3.5   No results for input(s): LIPASE, AMYLASE in the last 168 hours. No results for input(s): AMMONIA in the last 168 hours. Coagulation Profile: Recent Labs  Lab 03/11/21 1157 03/12/21 2242  INR 1.1 1.2   Cardiac Enzymes: No results for input(s): CKTOTAL, CKMB, CKMBINDEX, TROPONINI in the last 168 hours. BNP (last 3 results) No results for input(s): PROBNP in the last 8760 hours. HbA1C: No results for input(s): HGBA1C in the last 72 hours. CBG: Recent Labs  Lab 03/14/21 2148 03/15/21 0829 03/15/21 1124 03/15/21 1606 03/15/21 2139  GLUCAP 127* 137* 126* 119* 131*   Lipid Profile: No results for input(s): CHOL, HDL, LDLCALC, TRIG, CHOLHDL, LDLDIRECT in the last 72 hours. Thyroid Function Tests: No results for input(s): TSH, T4TOTAL, FREET4, T3FREE, THYROIDAB in the last 72 hours. Anemia Panel: No results for input(s): VITAMINB12, FOLATE, FERRITIN, TIBC, IRON, RETICCTPCT in the last 72 hours. Sepsis Labs: Recent Labs  Lab 03/12/21 1500 03/12/21 2242 03/13/21 0515  PROCALCITON 0.16  --   --   LATICACIDVEN  --  2.1* 1.3    Recent Results (from the past 240 hour(s))  Resp Panel by RT-PCR (Flu A&B, Covid) Nasopharyngeal Swab     Status: None   Collection Time: 03/11/21 12:50 PM   Specimen: Nasopharyngeal Swab; Nasopharyngeal(NP) swabs in vial transport medium  Result Value Ref Range Status   SARS Coronavirus 2 by RT PCR NEGATIVE NEGATIVE Final    Comment: (NOTE) SARS-CoV-2 target nucleic  acids are NOT DETECTED.  The SARS-CoV-2 RNA is generally detectable in upper respiratory specimens during the acute phase of infection. The lowest concentration of SARS-CoV-2 viral copies this assay can detect is 138 copies/mL. A negative result does not preclude SARS-Cov-2 infection and should not be used as the sole basis for treatment or other patient management decisions. A negative result may occur with  improper specimen collection/handling, submission of specimen other than nasopharyngeal swab, presence of viral mutation(s) within the areas targeted by this assay, and inadequate number of viral copies(<138 copies/mL). A negative result must be combined with clinical observations, patient history, and epidemiological information. The expected result is Negative.  Fact Sheet for Patients:  EntrepreneurPulse.com.au  Fact Sheet for Healthcare Providers:  IncredibleEmployment.be  This test is no t yet approved or cleared by the [place] FDA and  has been authorized for detection and/or diagnosis of SARS-CoV-2 by FDA under an Emergency Use Authorization (EUA). This EUA will remain  in effect (meaning this test can be used) for the duration of the COVID-19 declaration under Section 564(b)(1) of the Act, 21 U.S.C.section 360bbb-3(b)(1), unless the authorization is terminated  or revoked sooner.       Influenza A by PCR NEGATIVE NEGATIVE Final   Influenza B by PCR NEGATIVE NEGATIVE Final    Comment: (NOTE) The Xpert Xpress SARS-CoV-2/FLU/RSV plus assay is intended as an aid in the diagnosis of influenza from Nasopharyngeal swab specimens and should not be used as a sole basis for treatment. Nasal washings and aspirates are unacceptable for Xpert Xpress SARS-CoV-2/FLU/RSV testing.  Fact Sheet for Patients: EntrepreneurPulse.com.au  Fact Sheet for Healthcare Providers: IncredibleEmployment.be  This  test is not yet approved or cleared by the [place] FDA and has been authorized for detection and/or diagnosis of SARS-CoV-2 by FDA under an Emergency Use Authorization (EUA). This EUA will remain in effect (meaning this test can be used) for the duration of the COVID-19 declaration under Section 564(b)(1) of the Act, 21 U.S.C. section 360bbb-3(b)(1), unless the authorization is terminated or revoked.  Performed at [facility], [address] [place], [place] [address]   Resp Panel by RT-PCR (Flu A&B, Covid) Nasopharyngeal Swab     Status: None   Collection Time: 03/12/21  3:00 PM   Specimen: Nasopharyngeal Swab; Nasopharyngeal(NP) swabs in vial transport medium  Result Value Ref Range Status   SARS Coronavirus 2 by RT PCR NEGATIVE NEGATIVE Final    Comment: (NOTE) SARS-CoV-2 target nucleic acids are NOT DETECTED.  The SARS-CoV-2 RNA is generally detectable in upper respiratory specimens during the acute phase of infection. The lowest concentration of SARS-CoV-2 viral copies this assay can detect is 138 copies/mL. A negative result does not preclude SARS-Cov-2 infection and should not be used as the sole basis for treatment or other patient management decisions. A negative result may occur with  improper specimen collection/handling, submission of specimen other than nasopharyngeal swab, presence of viral mutation(s) within the areas targeted by this assay, and inadequate number of viral copies(<138 copies/mL). A negative result must be combined with clinical observations, patient history, and epidemiological information. The expected result is Negative.  Fact Sheet for Patients:  EntrepreneurPulse.com.au  Fact Sheet for Healthcare Providers:  IncredibleEmployment.be  This test is no t yet approved or cleared by the [place] FDA and  has been authorized for detection and/or diagnosis of SARS-CoV-2 by FDA under an  Emergency Use Authorization (EUA). This EUA will remain  in effect (meaning this test can be used) for the duration of the COVID-19 declaration under Section 564(b)(1) of the Act, 21 U.S.C.section 360bbb-3(b)(1), unless the authorization is terminated  or revoked sooner.       Influenza A by PCR NEGATIVE NEGATIVE Final   Influenza B by PCR NEGATIVE NEGATIVE Final    Comment: (NOTE) The Xpert Xpress SARS-CoV-2/FLU/RSV plus assay is intended as an aid in the diagnosis of influenza from Nasopharyngeal swab specimens and should not be used as a sole basis for treatment. Nasal washings and aspirates are unacceptable for Xpert Xpress SARS-CoV-2/FLU/RSV testing.  Fact Sheet for Patients: EntrepreneurPulse.com.au  Fact Sheet for Healthcare Providers: IncredibleEmployment.be  This test is not yet approved or cleared by the  [place] Architectural technologist and has been authorized for detection and/or diagnosis of SARS-CoV-2 by FDA under an Print production planner (EUA). This EUA will remain in effect (meaning this test can be used) for the duration of the COVID-19 declaration under Section 564(b)(1) of the Act, 21 U.S.C. section 360bbb-3(b)(1), unless the authorization is terminated or revoked.  Performed at [facility], [address] [place], [place] [address]   Culture, blood (Routine X 2) w Reflex to ID Panel     Status: None (Preliminary result)   Collection Time: 03/12/21 10:42 PM   Specimen: BLOOD  Result Value Ref Range Status   Specimen Description BLOOD LEFT FOREARM  Final   Special Requests   Final    BOTTLES DRAWN AEROBIC AND ANAEROBIC Blood Culture adequate volume   Culture   Final    NO GROWTH 4 DAYS Performed at [facility], [address], [place], [place] [address]    Report Status PENDING  Incomplete  Culture, blood (Routine X 2) w Reflex to ID Panel     Status: None (Preliminary result)   Collection Time: 03/12/21  10:44 PM   Specimen: BLOOD  Result Value Ref Range Status   Specimen Description BLOOD LEFT ASSIST CONTROL  Final   Special Requests   Final    BOTTLES DRAWN AEROBIC ONLY Blood Culture adequate volume   Culture   Final    NO GROWTH 4 DAYS Performed at [facility], [address] [place], [place] [address]    Report Status PENDING  Incomplete         Radiology Studies: No results found.      Scheduled Meds:  atorvastatin  20 mg Oral Daily   enoxaparin (LOVENOX) injection  40 mg Subcutaneous Q24H   famotidine (PEPCID) IV  20 mg Intravenous Q12H   guaiFENesin  600 mg Oral BID   insulin aspart  0-9 Units Subcutaneous TID AC & HS   lisinopril  5 mg Oral Daily   pantoprazole (PROTONIX) IV  40 mg Intravenous Daily   sodium chloride flush  3 mL Intravenous Q12H   Continuous Infusions:  sodium chloride     ampicillin-sulbactam (UNASYN) IV 3 g (03/16/21 0602)   azithromycin Stopped (03/15/21 2230)   dextrose 5 % and 0.9% NaCl 100 mL/hr at 03/15/21 2200     LOS: 4 days    Time spent: 30  mins     Wyvonnia Dusky, MD [facility] Pager [phone]  If 7PM-7AM, please contact night-coverage [email or web address] 03/16/2021, 7:41 AM

## 2021-03-16 NOTE — Care Management Important Message (Signed)
Important Message  Patient Details  Name: Louis Stone MRN: [id number] Date of Birth: 02-26-1951   Medicare Important Message Given:  Yes     Dannette Barbara 03/16/2021, 11:42 AM

## 2021-03-16 NOTE — Progress Notes (Signed)
Inpatient Diabetes Program Recommendations  AACE/ADA: New Consensus Statement on Inpatient Glycemic Control (2015)  Target Ranges:  Prepandial:   less than 140 mg/dL      Peak postprandial:   less than 180 mg/dL (1-2 hours)      Critically ill patients:  140 - 180 mg/dL   Lab Results  Component Value Date   GLUCAP 154 (H) 03/16/2021   HGBA1C 6.8 (H) 02/02/2021    Review of Glycemic Control Results for Louis Stone, Louis Stone" (MRN [id number]) as of 03/16/2021 09:10  Ref. Range 03/15/2021 08:29 03/15/2021 11:24 03/15/2021 16:06 03/15/2021 21:39 03/16/2021 08:25  Glucose-Capillary Latest Ref Range: 70 - 99 mg/dL 137 (H) 126 (H) 119 (H) 131 (H) 154 (H)   Diabetes history: DM 2 Outpatient Diabetes medications:  Toujeo 25 units daily Current orders for Inpatient glycemic control:  Novolog sensitive tid with meals and HS  Inpatient Diabetes Program Recommendations:    Please reduce Novolog correction to "Very sensitive"  0-6 units tid with meals. If tube feeds added, will need to increase frequency to q 4 hours.   Thanks,  Adah Perl, RN, BC-ADM Inpatient Diabetes Coordinator Pager [phone]  (8a-5p)

## 2021-03-16 NOTE — Plan of Care (Signed)

## 2021-03-16 NOTE — Progress Notes (Signed)
SLP Cancellation Note  Patient Details Name: Ashante Vanloo MRN: [id number] DOB: 1951/01/03   Cancelled treatment:       Reason Eval/Treat Not Completed: Medical issues which prohibited therapy (chart reviewed; consulted w/ MD this AM). Discussed w/ MD the findings and recommendations of the MBSS again including the recommendation for pt to be NPO w/ alternative means of feeding -- even the discussion/recommendation for more long term means: PEG d/t the degree of dysphagia(suspect ongoing per recent Chest CT Imaging: "Aspirated material is noted in the trachea and right mainstem bronchus".  Pt also has severe malnutrition (dx'd 02/2021 in chart).  Suspect his presentation could be impact of the late effects of the H&N Ca/Radiation and would not be expected to be immediate reversible w/ dysphagia tx in the near future.  Due to concern that he is unable to meet his nutritional needs fully and safely at this point, more long term alternative means of feeding is recommended w/ strict NPO status currently d/t risk for Pulmonary decline while hospitalized/acutely ill. Recommended a Palliative Care consult to discuss [place] also.  ST services can be available to give education to pt on oral care and potential introduction of Pleasure ice chips post thorough oral care to engage pharyngeal swallowing; oropharyngeal phase stimulation per MD discussion.    Orinda Kenner, MS, CCC-SLP Speech Language Pathologist Rehab Services [phone] [facility] 03/16/2021, 4:03 PM

## 2021-03-17 ENCOUNTER — Inpatient Hospital Stay: Payer: Medicare Other

## 2021-03-17 ENCOUNTER — Encounter: Payer: Self-pay | Admitting: Family Medicine

## 2021-03-17 DIAGNOSIS — Z7189 Other specified counseling: Secondary | ICD-10-CM | POA: Diagnosis not present

## 2021-03-17 DIAGNOSIS — E1169 Type 2 diabetes mellitus with other specified complication: Secondary | ICD-10-CM | POA: Diagnosis not present

## 2021-03-17 DIAGNOSIS — Z515 Encounter for palliative care: Secondary | ICD-10-CM

## 2021-03-17 DIAGNOSIS — R1312 Dysphagia, oropharyngeal phase: Secondary | ICD-10-CM | POA: Diagnosis not present

## 2021-03-17 DIAGNOSIS — J690 Pneumonitis due to inhalation of food and vomit: Secondary | ICD-10-CM | POA: Diagnosis not present

## 2021-03-17 LAB — CBC
HCT: 29.1 % — ABNORMAL LOW (ref 39.0–52.0)
Hemoglobin: 9.3 g/dL — ABNORMAL LOW (ref 13.0–17.0)
MCH: 26.5 pg (ref 26.0–34.0)
MCHC: 32 g/dL (ref 30.0–36.0)
MCV: 82.9 fL (ref 80.0–100.0)
Platelets: 251 10*3/uL (ref 150–400)
RBC: 3.51 MIL/uL — ABNORMAL LOW (ref 4.22–5.81)
RDW: 14.4 % (ref 11.5–15.5)
WBC: 4 10*3/uL (ref 4.0–10.5)
nRBC: 0 % (ref 0.0–0.2)

## 2021-03-17 LAB — BASIC METABOLIC PANEL
Anion gap: 5 (ref 5–15)
BUN: <5 mg/dL — ABNORMAL LOW (ref 8–23)
CO2: 25 mmol/L (ref 22–32)
Calcium: 7.8 mg/dL — ABNORMAL LOW (ref 8.9–10.3)
Chloride: 108 mmol/L (ref 98–111)
Creatinine, Ser: 0.74 mg/dL (ref 0.61–1.24)
GFR, Estimated: >60 mL/min (ref >60)
Glucose, Bld: 146 mg/dL — ABNORMAL HIGH (ref 70–99)
Potassium: 3.1 mmol/L — ABNORMAL LOW (ref 3.5–5.1)
Sodium: 138 mmol/L (ref 135–145)

## 2021-03-17 LAB — GLUCOSE, CAPILLARY
Glucose-Capillary: 103 mg/dL — ABNORMAL HIGH (ref 70–99)
Glucose-Capillary: 115 mg/dL — ABNORMAL HIGH (ref 70–99)
Glucose-Capillary: 123 mg/dL — ABNORMAL HIGH (ref 70–99)
Glucose-Capillary: 129 mg/dL — ABNORMAL HIGH (ref 70–99)
Glucose-Capillary: 139 mg/dL — ABNORMAL HIGH (ref 70–99)
Glucose-Capillary: 141 mg/dL — ABNORMAL HIGH (ref 70–99)

## 2021-03-17 LAB — CULTURE, BLOOD (ROUTINE X 2)
Culture: NO GROWTH
Culture: NO GROWTH
Special Requests: ADEQUATE
Special Requests: ADEQUATE

## 2021-03-17 LAB — LEGIONELLA PNEUMOPHILA SEROGP 1 UR AG: L. pneumophila Serogp 1 Ur Ag: NEGATIVE

## 2021-03-17 MED ORDER — IOHEXOL 300 MG/ML  SOLN: 75.0000 mL | Freq: Once | INTRAMUSCULAR | Status: DC

## 2021-03-17 MED ORDER — IOHEXOL 300 MG/ML  SOLN
75.0000 mL | Freq: Once | INTRAMUSCULAR | Status: AC | PRN
  Administered 2021-03-17: 75 mL

## 2021-03-17 MED ORDER — POTASSIUM CHLORIDE 10 MEQ/100ML IV SOLN
10.0000 meq | INTRAVENOUS | Status: AC
  Administered 2021-03-17 (×4): 10 meq via INTRAVENOUS
  Filled 2021-03-17 (×4): qty 100

## 2021-03-17 MED ORDER — ENOXAPARIN SODIUM 40 MG/0.4ML IJ SOSY
40.0000 mg | PREFILLED_SYRINGE | INTRAMUSCULAR | Status: DC
  Administered 2021-03-18 – 2021-03-20 (×3): 40 mg via SUBCUTANEOUS
  Filled 2021-03-17 (×3): qty 0.4

## 2021-03-17 MED ORDER — IOHEXOL 300 MG/ML  SOLN
75.0000 mL | Freq: Once | INTRAMUSCULAR | Status: DC | PRN
  Administered 2021-03-17: 16:00:00 75 mL

## 2021-03-17 NOTE — Progress Notes (Signed)
Pt verbalizes frustration over inability to eat. Request second opinion by an ENT. POC discussed with pt and MD. Following reinforcement of education patient agreeable to placement of feed tube for nutrition. MD present at time of verbal consent for POC to include feeding tube. MD discussed with pt. Second opinion would need to be out pt. Discussed would reach out to case manager to discuss obtaining a list of resources/ ENT providers. Discussed possible nutritionist consult with MD and pt.  Pt agreeable. Followed up with Caseworker Myrtis Hopping to request list of and updated pt that that request was made.

## 2021-03-17 NOTE — TOC Progression Note (Signed)
Transition of Care [facility]) - Progression Note    Patient Details  Name: Colston Einck MRN: [id number] Date of Birth: March 10, 1951  Transition of Care [facility]) CM/SW [place], RN Phone Number: 03/17/2021, 10:57 AM  Clinical Narrative:   Palliative care saw patient today.  Patient agreed to PEG tube placement, he has had one prior to this admission.  He states he is experienced with PEG tube.  TOC contact information given, TOC to follow to discharge.    Expected Discharge Plan:  (TBD) Barriers to Discharge: Continued Medical Work up  Expected Discharge Plan and Services Expected Discharge Plan:  (TBD)       Living arrangements for the past 2 months: Single Family Home                 DME Arranged:  (TBD)                 Representative spoke with at [place]: Per Tommi Rumps at [place], patient was assigned but not picked up due to home conditions   Social Determinants of Health (SDOH) Interventions    Readmission Risk Interventions Readmission Risk Prevention Plan 02/03/2021  Transportation Screening Complete  PCP or Specialist Appt within 3-5 Days Complete  HRI or [place] Complete  Social Work Consult for [place] Planning/Counseling Complete  Palliative Care Screening Not Applicable  Medication Review Press photographer) Complete  Some recent data might be hidden

## 2021-03-17 NOTE — Progress Notes (Signed)
PROGRESS NOTE   HPI was taken from Dr. Sidney Ace: Louis Stone is a 70 y.o. African-American male with medical history significant for osteoarthritis, type 2 diabetes mellitus, oropharyngeal stage IV squamous cell carcinoma, status post chemotherapy and radiotherapy and recent admission from 7/31 till 8/5 for acute hypoxic respiratory failure due to multifocal pneumonia as well as ED visits recently including 8/15, who presented to the emergency room with acute onset of generalized weakness.  The patient refused SNF placement on 8/15.  He is currently feeling so weak he can hardly stand up without help.  He admitted to cough productive of whitish sputum without wheezing however with mild dyspnea.  No recent falls or injuries.  He has mild headache without dizziness or blurred vision.  No chest pain or palpitations.  No nausea or vomiting or diarrhea or abdominal pain.  No dysuria, oliguria or hematuria or flank pain.  He is currently agreeable to go to rehab after his discharge.   ED Course: When he came to the ER vital signs were within normal and later respiratory rate was 22 and 29 and later heart rate was 101.  Pulse oximetry briefly dropped to the 80s but was later on within normal on room air.  Labs revealed mild hyponatremia and hyperglycemia 51 with a BUN of 22 and creatinine 1.19.  High-sensitivity troponin I was 21 with procalcitonin 0.16.  CBC showed mild anemia and D-dimer was 2.23.  Influenza antigens and COVID-19 PCR came back negative. EKG as reviewed by me : EKG showed normal sinus rhythm with rate of 73 with minimal voltage criteria for LVH and early repolarization in V3 and V2. Imaging: Chest x-ray showed new patchy opacities in the lateral left base that may reflect infection or aspiration.  Chest CTA revealed multiple ill-defined opacities in both lung bases, left greater than right concerning for multifocal pneumonia with no evidence for PE and aspirated material noted in the trachea  and right mainstem bronchus with aortic atherosclerosis.  The patient was given an amp of D50, IV vancomycin and Zosyn and 650 mg p.o. Tylenol.  He will be admitted to a medical monitored bed for further evaluation and management.   Hospital course from Dr. Jimmye Norman 9/9-9/13/22: Pt was found to have aspiration pneumonia and has been on IV unasyn. Speech was consulted and swallow study showed severe oropharyngeal phase dysphagia w/ aspiration with liquids. Discussed case w/ ENT, Dr. Freda Munro, and pt was seen in May by Dr. 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 Not surprising pt has swallowing issues after treatment as per ENT. May need a PEG tube. After several days of NPO, speech still recommended PEG tube. After multiple discussions, the pt is agreeable to PEG tube placement. IR, Dr. Dwaine Gale, was consulted today, will see the pt and likely put in PEG tomorrow.     Louis Stone  KZL:[id number] DOB: Aug 21, 1950 DOA: 03/12/2021 PCP: Theotis Burrow, MD   Assessment & Plan:   Active Problems:   Aspiration pneumonia ([place])   Likely aspiration pneumonia: continue on IV unasyn, bronchodilators & encourage incentive spirometry    Dysphagia: likely secondary to hx of oropharyngeal squamous cell carcinoma. Swallow study shows severe oropharyngeal phase dysphagia w/ aspiration with liquids. Discussed case w/ ENT, Dr. Freda Munro, and pt was seen in May by Dr. 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 Not surprising pt has swallowing issues after treatment as per ENT. May need a PEG tube. Continue NPO today. Discussed findings of swallow study, NPO status & possible placement of NG tube and if NG tube is unsuccessful/unable to place, possible PEG tube placement. Dicussed  w/ speech again today and they still recommend alternate form of feedings. Pt is agreeable to PEG tube placement. IR consulted and PEG will likely be placed tomorrow  Sepsis: met criteria w/ tachycardia, tachypnea and pneumonia. Continue on IVFs & IV abxs. Blood cxs NGTD. Resolved   DM2: likely well controlled. W/ hypoglycemic episodes. Continue on IV D5NS.     Normocytic anemia: H&H are labile. No need for a transfusion currently    HLD: continue on statin   HTN: continue on ACE-I   GERD: continue on PPI   Oropharyngeal squamous cell carcinoma: stage IV. Management as per oncology    DVT prophylaxis: lovenox  Code Status: full  Family Communication: dicussed pt's care w/ pt's stepdaughter, Tiffany, and answered his questions Disposition Plan: likely d/c back home   Level of care: Med-Surg  Status is: Inpatient  Remains inpatient appropriate because:Unsafe d/c plan, IV treatments appropriate due to intensity of illness or inability to take PO, and Inpatient level of care appropriate due to severity of illness, will likely have PEG tube placed by IR tomorrow   Dispo: The patient is from: Home              Anticipated d/c is to: Home              Patient currently is not medically stable to d/c.   Difficult to place patient Yes    Consultants:  Palliative care   Procedures:   Antimicrobials: unasyn    Subjective: Pt c/o being hungry   Objective: Vitals:   03/16/21 1640 03/16/21 1957 03/17/21 0030 03/17/21 0501  BP: (!) 147/77 138/76 126/65 113/64  Pulse: (!) 52 (!) 50 (!) 58 (!) 56  Resp: _0 Temp: 97.9 F (36.6 C) 98.5 F (36.9 C) 98 F (36.7 C) 98.1 F (36.7 C)  TempSrc:      SpO2: 100% 97% 97% 97%  Weight:      Height:        Intake/Output Summary (Last 24 hours) at 03/17/2021 0742 Last data filed at 03/17/2021 0530 Gross per 24 hour  Intake --  Output 750 ml  Net -750 ml   Filed Weights   03/12/21 1227 03/12/21 2105  Weight: 70 kg 54.3  kg    Examination:  General exam: Appears calm. Appears older than stated age Respiratory system: diminished breath sounds b/l  Cardiovascular system: S1/S2+. No rubs or clicks  Gastrointestinal system: Abd is soft, NT, ND & hypoactive bowel sounds Central nervous system: Alert and oriented. Moves all extremities   Psychiatry: Judgement and insight appear poor. Flat mood and affect    Data Reviewed: I have personally reviewed following labs and imaging studies  CBC: Recent Labs  Lab 03/11/21 1157 03/12/21 1224 03/13/21 0515 03/14/21 0550 03/15/21 0509 03/16/21 0509 03/17/21 0521  WBC 8.2 8.2 8.9 4.1 3.9* 4.3 4.0  NEUTROABS 7.5 7.5  --   --   --   --   --   HGB 11.9* 12.4* 10.6* 9.8* 9.8* 10.0* 9.3*  HCT 35.1* 37.8* 31.6* 29.7* 30.1* 30.1* 29.1*  MCV 81.6 81.1 82.1 83.4 82.9  83.4 82.9  PLT 274 304 259 248 264 257 161   Basic Metabolic Panel: Recent Labs  Lab 03/13/21 0515 03/14/21 0550 03/15/21 0509 03/16/21 0509 03/17/21 0521  NA 132* 138 139 141 138  K 3.8 3.4* 3.6 3.6 3.1*  CL 99 106 110 112* 108  CO2 _0 GLUCOSE 57* 93 153* 137* 146*  BUN 18 9 6* 5* <5*  CREATININE 0.94 0.85 0.82 0.89 0.74  CALCIUM 8.5* 8.1* 7.9* 8.1* 7.8*   GFR: Estimated Creatinine Clearance: 67.9 mL/min (by C-G formula based on SCr of 0.74 mg/dL). Liver Function Tests: Recent Labs  Lab 03/11/21 1157 03/12/21 1224  AST 26 28  ALT 17 14  ALKPHOS 76 81  BILITOT 0.9 0.9  PROT 8.4* 8.4*  ALBUMIN 3.9 3.5   No results for input(s): LIPASE, AMYLASE in the last 168 hours. No results for input(s): AMMONIA in the last 168 hours. Coagulation Profile: Recent Labs  Lab 03/11/21 1157 03/12/21 2242  INR 1.1 1.2   Cardiac Enzymes: No results for input(s): CKTOTAL, CKMB, CKMBINDEX, TROPONINI in the last 168 hours. BNP (last 3 results) No results for input(s): PROBNP in the last 8760 hours. HbA1C: No results for input(s): HGBA1C in the last 72 hours. CBG: Recent Labs   Lab 03/16/21 1146 03/16/21 1641 03/16/21 1959 03/17/21 0029 03/17/21 0458  GLUCAP 136* 137* 115* 139* 141*   Lipid Profile: No results for input(s): CHOL, HDL, LDLCALC, TRIG, CHOLHDL, LDLDIRECT in the last 72 hours. Thyroid Function Tests: No results for input(s): TSH, T4TOTAL, FREET4, T3FREE, THYROIDAB in the last 72 hours. Anemia Panel: No results for input(s): VITAMINB12, FOLATE, FERRITIN, TIBC, IRON, RETICCTPCT in the last 72 hours. Sepsis Labs: Recent Labs  Lab 03/12/21 1500 03/12/21 2242 03/13/21 0515  PROCALCITON 0.16  --   --   LATICACIDVEN  --  2.1* 1.3    Recent Results (from the past 240 hour(s))  Resp Panel by RT-PCR (Flu A&B, Covid) Nasopharyngeal Swab     Status: None   Collection Time: 03/11/21 12:50 PM   Specimen: Nasopharyngeal Swab; Nasopharyngeal(NP) swabs in vial transport medium  Result Value Ref Range Status   SARS Coronavirus 2 by RT PCR NEGATIVE NEGATIVE Final    Comment: (NOTE) SARS-CoV-2 target nucleic acids are NOT DETECTED.  The SARS-CoV-2 RNA is generally detectable in upper respiratory specimens during the acute phase of infection. The lowest concentration of SARS-CoV-2 viral copies this assay can detect is 138 copies/mL. A negative result does not preclude SARS-Cov-2 infection and should not be used as the sole basis for treatment or other patient management decisions. A negative result may occur with  improper specimen collection/handling, submission of specimen other than nasopharyngeal swab, presence of viral mutation(s) within the areas targeted by this assay, and inadequate number of viral copies(<138 copies/mL). A negative result must be combined with clinical observations, patient history, and epidemiological information. The expected result is Negative.  Fact Sheet for Patients:  EntrepreneurPulse.com.au  Fact Sheet for Healthcare Providers:  IncredibleEmployment.be  This test is no t yet  approved or cleared by the [place] FDA and  has been authorized for detection and/or diagnosis of SARS-CoV-2 by FDA under an Emergency Use Authorization (EUA). This EUA will remain  in effect (meaning this test can be used) for the duration of the COVID-19 declaration under Section 564(b)(1) of the Act, 21 U.S.C.section 360bbb-3(b)(1), unless the authorization is terminated  or revoked sooner.       Influenza A by  PCR NEGATIVE NEGATIVE Final   Influenza B by PCR NEGATIVE NEGATIVE Final    Comment: (NOTE) The Xpert Xpress SARS-CoV-2/FLU/RSV plus assay is intended as an aid in the diagnosis of influenza from Nasopharyngeal swab specimens and should not be used as a sole basis for treatment. Nasal washings and aspirates are unacceptable for Xpert Xpress SARS-CoV-2/FLU/RSV testing.  Fact Sheet for Patients: EntrepreneurPulse.com.au  Fact Sheet for Healthcare Providers: IncredibleEmployment.be  This test is not yet approved or cleared by the [place] FDA and has been authorized for detection and/or diagnosis of SARS-CoV-2 by FDA under an Emergency Use Authorization (EUA). This EUA will remain in effect (meaning this test can be used) for the duration of the COVID-19 declaration under Section 564(b)(1) of the Act, 21 U.S.C. section 360bbb-3(b)(1), unless the authorization is terminated or revoked.  Performed at [facility], [address] [place], [place] [address]   Resp Panel by RT-PCR (Flu A&B, Covid) Nasopharyngeal Swab     Status: None   Collection Time: 03/12/21  3:00 PM   Specimen: Nasopharyngeal Swab; Nasopharyngeal(NP) swabs in vial transport medium  Result Value Ref Range Status   SARS Coronavirus 2 by RT PCR NEGATIVE NEGATIVE Final    Comment: (NOTE) SARS-CoV-2 target nucleic acids are NOT DETECTED.  The SARS-CoV-2 RNA is generally detectable in upper respiratory specimens during the acute phase of infection.  The lowest concentration of SARS-CoV-2 viral copies this assay can detect is 138 copies/mL. A negative result does not preclude SARS-Cov-2 infection and should not be used as the sole basis for treatment or other patient management decisions. A negative result may occur with  improper specimen collection/handling, submission of specimen other than nasopharyngeal swab, presence of viral mutation(s) within the areas targeted by this assay, and inadequate number of viral copies(<138 copies/mL). A negative result must be combined with clinical observations, patient history, and epidemiological information. The expected result is Negative.  Fact Sheet for Patients:  EntrepreneurPulse.com.au  Fact Sheet for Healthcare Providers:  IncredibleEmployment.be  This test is no t yet approved or cleared by the [place] FDA and  has been authorized for detection and/or diagnosis of SARS-CoV-2 by FDA under an Emergency Use Authorization (EUA). This EUA will remain  in effect (meaning this test can be used) for the duration of the COVID-19 declaration under Section 564(b)(1) of the Act, 21 U.S.C.section 360bbb-3(b)(1), unless the authorization is terminated  or revoked sooner.       Influenza A by PCR NEGATIVE NEGATIVE Final   Influenza B by PCR NEGATIVE NEGATIVE Final    Comment: (NOTE) The Xpert Xpress SARS-CoV-2/FLU/RSV plus assay is intended as an aid in the diagnosis of influenza from Nasopharyngeal swab specimens and should not be used as a sole basis for treatment. Nasal washings and aspirates are unacceptable for Xpert Xpress SARS-CoV-2/FLU/RSV testing.  Fact Sheet for Patients: EntrepreneurPulse.com.au  Fact Sheet for Healthcare Providers: IncredibleEmployment.be  This test is not yet approved or cleared by the [place] FDA and has been authorized for detection and/or diagnosis of SARS-CoV-2 by FDA  under an Emergency Use Authorization (EUA). This EUA will remain in effect (meaning this test can be used) for the duration of the COVID-19 declaration under Section 564(b)(1) of the Act, 21 U.S.C. section 360bbb-3(b)(1), unless the authorization is terminated or revoked.  Performed at [facility], [address] [place], [place] [address]   Culture, blood (Routine X 2) w Reflex to ID Panel     Status: None (Preliminary result)  Collection Time: 03/12/21 10:42 PM   Specimen: BLOOD  Result Value Ref Range Status   Specimen Description BLOOD LEFT FOREARM  Final   Special Requests   Final    BOTTLES DRAWN AEROBIC AND ANAEROBIC Blood Culture adequate volume   Culture   Final    NO GROWTH 4 DAYS Performed at [facility], [address], [place], [place] [address]    Report Status PENDING  Incomplete  Culture, blood (Routine X 2) w Reflex to ID Panel     Status: None (Preliminary result)   Collection Time: 03/12/21 10:44 PM   Specimen: BLOOD  Result Value Ref Range Status   Specimen Description BLOOD LEFT ASSIST CONTROL  Final   Special Requests   Final    BOTTLES DRAWN AEROBIC ONLY Blood Culture adequate volume   Culture   Final    NO GROWTH 4 DAYS Performed at [facility], [address], [place], [place] [address]    Report Status PENDING  Incomplete         Radiology Studies: No results found.      Scheduled Meds:  atorvastatin  20 mg Oral Daily   enoxaparin (LOVENOX) injection  40 mg Subcutaneous Q24H   famotidine (PEPCID) IV  20 mg Intravenous Q12H   guaiFENesin  600 mg Oral BID   insulin aspart  0-6 Units Subcutaneous Q4H   lisinopril  5 mg Oral Daily   pantoprazole (PROTONIX) IV  40 mg Intravenous Daily   sodium chloride flush  3 mL Intravenous Q12H   Continuous Infusions:  sodium chloride     ampicillin-sulbactam (UNASYN) IV 3 g (03/17/21 0530)   dextrose 5 % and 0.9% NaCl 100 mL/hr at 03/16/21 2008     LOS: 5 days     Time spent: 25  mins     Wyvonnia Dusky, MD [facility] Pager [phone]  If 7PM-7AM, please contact night-coverage [email or web address] 03/17/2021, 7:42 AM

## 2021-03-17 NOTE — Progress Notes (Signed)
Initial Nutrition Assessment  DOCUMENTATION CODES:  Severe malnutrition in context of chronic illness  INTERVENTION:  Pending PEG placement, recommend the following TF regimen: Glucerna 1.5 @ 51m/h (1.08L/d) 1087mfree water q4h This regimen provides 1620kcal, 89g of protein, and 142070mf fluid. Will adjust regimen to bolus feeds prior to discharge.  NUTRITION DIAGNOSIS:  Severe Malnutrition (in the context of chronic illness) related to inability to eat, dysphagia as evidenced by severe muscle depletion, severe fat depletion, percent weight loss (18.9% weight loss x 5 months).  GOAL:  Patient will meet greater than or equal to 90% of their needs  MONITOR:  TF tolerance, Weight trends  REASON FOR ASSESSMENT:  Consult Enteral/tube feeding initiation and management  ASSESSMENT:  [age]o. male with medical history significant of type 2 DM, GERD, HLD, HTN, and stage IV head/neck cancer s/p chemo/radiation presented to the ED for weakness. Seen twice in the last month since admission for the same.  Pt recently admitted 7/31-8/5 for pneumonia and followed by nutrition services. At that time, pt noted to have severe weight loss and poor living conditions at home. Dx with severe chronic malnutrition during previous admission.  Imaging in ED suggestive of pneumonia and there was concern for aspirated material in the trachea and lungs. Pt made NPO and underwent MBS with SLP 9/9 which showed severe-profound oropharyngeal phase dysphagia and pharyngo-esophageal phase dysphagia resulting in silent aspiration with both po trials. Study was terminated early due to findings.   Pt now being presented with the option of placing feeding tube as oral intake will result in continued cycle of aspiration and pneumonia. MD to consult IR for placement of NGT  Also noted, pt has not had a documented BM this admission.  Discussed pt in rounds, pt agreeable to PEG, MD to consult IR for placement.   Pt  resting in bed at the time of assessment. Reports that he is doing ok today, mild abdominal pain. Discussed pt's decision to move forward with PEG placement. States that he does not have any questions at this time as he had a PEG while he was receiving treatment for his head and neck cancer. Familiar with bolus feeds. Will place recommendations for feeds which can be utilized when PEG is placed.  Significant muscle and fat depletions seen on exam. IVF providing some nutrition at this time (408kcal/d). 18.9% weight loss noted in the last 5 months (4/22-9/8)  Nutritionally Relevant Medications: Scheduled Meds:  atorvastatin  20 mg Oral Daily   famotidine (PEPCID) IV  20 mg Intravenous Q12H   guaiFENesin  600 mg Oral BID   insulin aspart  0-6 Units Subcutaneous Q4H   pantoprazole (PROTONIX) IV  40 mg Intravenous Daily   Continuous Infusions:  ampicillin-sulbactam (UNASYN) IV 3 g (03/17/21 0530)   dextrose 5 % and 0.9% NaCl 100 mL/hr at 03/16/21 2008   PRN Meds: magnesium hydroxide, ondansetron, simethicone  Labs Reviewed: K 3.1 BUN <5 SBG ranges from 115-154 mg/dL over the last 24 hours HgbA1c: 6.8% (8/1)  NUTRITION - FOCUSED PHYSICAL EXAM: Flowsheet Row Most Recent Value  Orbital Region Severe depletion  Upper Arm Region Moderate depletion  Thoracic and Lumbar Region Severe depletion  Buccal Region Severe depletion  Temple Region Severe depletion  Clavicle Bone Region Severe depletion  Clavicle and Acromion Bone Region Severe depletion  Scapular Bone Region Severe depletion  Dorsal Hand Mild depletion  Patellar Region Severe depletion  Anterior Thigh Region Severe depletion  Posterior Calf Region Severe depletion  Edema (RD Assessment) None  Hair Reviewed  Eyes Reviewed  Mouth Reviewed  Skin Reviewed  Nails Reviewed   Diet Order:   Diet Order             Diet NPO time specified  Diet effective now                   EDUCATION NEEDS:  Education needs have been  addressed  Skin:  Skin Assessment: Reviewed RN Assessment  Last BM:  prior to admission  Height:  Ht Readings from Last 1 Encounters:  03/12/21 '5\' 3"'$  (1.6 m)   Weight:  Wt Readings from Last 1 Encounters:  03/12/21 54.3 kg    Ideal Body Weight:  56.4 kg  BMI:  Body mass index is 21.21 kg/m.  Estimated Nutritional Needs:  Kcal:  1600-1800 kcal/d Protein:  80-90 g/d Fluid:  1.8-2L/d   Ranell Patrick, RD, LDN Clinical Dietitian Pager on [place]

## 2021-03-17 NOTE — Consult Note (Signed)
Chief Complaint: Patient was seen in consultation today for dysphagia at the request of Wyvonnia Dusky, MD  Referring Physician(s): Wyvonnia Dusky, MD  Supervising Physician: Mir, Sharen Heck  Patient Status: [facility] - In-pt  History of Present Illness: Louis Stone is a 70 y.o. 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 The patient presented to the ED on 9/8 with weakness, respiratory failure due to multi-focal pneumonia and aspiration pneumonia. The patient has been evaluated by speech with severe dysphagia and aspiration seen. Request received for image guided percutaneous gastrostomy tube placement. The patient states he is aware of what a G-tube is and has previously had one several years ago. He denies any abdominal surgeries and denies any current abdominal pain. He denies any current chest pain or shortness of breath and denies any previous difficulty with sedation. He denies any chronic home oxygen use or history of sleep apnea.   Past Medical History:  Diagnosis Date   Arthritis    per pt. I do not have md notes that indicate dx.   Diabetes mellitus without complication ([place])    Oropharynx cancer ([place]) 2009   squamous cell cancer  Stage T3, N2c, M0   Personal history of tobacco use, presenting hazards to health 10/08/2015    Past Surgical History:  Procedure Laterality Date   [place]   pt states ruptured disc and had surgery to repair it    Allergies: Patient has no known allergies.  Medications: Prior to Admission medications   Medication Sig Start Date End Date Taking? Authorizing Provider  acetaminophen (TYLENOL) 325 MG tablet Take 650 mg by mouth every 6 (six) hours as needed for headache or mild pain.   Yes [provider]  albuterol (PROVENTIL HFA;VENTOLIN HFA) 108 (90 Base) MCG/ACT inhaler Inhale 2 puffs into the lungs every 6 (six) hours as needed for  wheezing.   Yes [provider]  benzonatate (TESSALON) 200 MG capsule Take 200 mg by mouth 3 (three) times daily as needed for cough.   Yes [provider]  famotidine (PEPCID) 40 MG tablet Take 40 mg by mouth daily.   Yes [provider]  lisinopril (ZESTRIL) 5 MG tablet Take 5 mg by mouth daily. 06/06/20  Yes [provider]  omeprazole (PRILOSEC) 40 MG capsule Take 40 mg by mouth daily. 06/06/20  Yes [provider]  TOUJEO SOLOSTAR 300 UNIT/ML Solostar Pen Inject 15 Units into the skin daily. 07/21/20  Yes Dahal, Marlowe Aschoff, MD  ACCU-CHEK AVIVA PLUS test strip  09/04/20   [provider]  atorvastatin (LIPITOR) 20 MG tablet Take 20 mg by mouth daily. Patient not taking: Reported on 03/12/2021 06/06/20   [provider]  guaiFENesin 200 MG tablet Take 1 tablet (200 mg total) by mouth every 6 (six) hours as needed for cough or to loosen phlegm. 03/11/21   Victorino Dike, FNP     Family History  Problem Relation Age of Onset   CVA Father    Prostate cancer Neg Hx        not sure what family member    Social History   Socioeconomic History   Marital status: Single    Spouse name: Not on file   Number of children: Not on file   Years of education: Not on file   Highest education level: Not on file  Occupational History   Not on file  Tobacco Use  Smoking status: Former    Packs/day: 1.50    Years: 40.00    Pack years: 60.00    Types: Cigarettes    Quit date: 07/05/2006    Years since quitting: 14.7   Smokeless tobacco: Never  Substance and Sexual Activity   Alcohol use: No    Comment: quit in 1991   Drug use: No   Sexual activity: Not on file  Other Topics Concern   Not on file  Social History Narrative   Not on file   Social Determinants of Health   Financial Resource Strain: Not on file  Food Insecurity: Not on file  Transportation Needs: Not on file  Physical Activity: Not on file  Stress: Not on file  Social  Connections: Not on file   Review of Systems: A 12 point ROS discussed and pertinent positives are indicated in the HPI above.  All other systems are negative.  Review of Systems  Vital Signs: BP (!) 148/82 (BP Location: Right Arm)   Pulse 63   Temp 98.1 F (36.7 C) (Oral)   Resp 16   Ht '5\' 3"'$  (1.6 m)   Wt 119 lb 11.4 oz (54.3 kg)   SpO2 97%   BMI 21.21 kg/m   Physical Exam Constitutional:      Comments: Frail appearing  Cardiovascular:     Rate and Rhythm: Normal rate and regular rhythm.  Pulmonary:     Effort: Pulmonary effort is normal. No respiratory distress.  Abdominal:     General: There is no distension.     Palpations: Abdomen is soft.     Tenderness: There is no abdominal tenderness.  Neurological:     Mental Status: He is alert and oriented to person, place, and time.  Psychiatric:        Thought Content: Thought content normal.    Imaging: DG Chest 2 View  Result Date: 03/11/2021 CLINICAL DATA:  Weakness, recent pneumonia EXAM: CHEST - 2 VIEW COMPARISON:  02/16/2021 FINDINGS: The heart size and mediastinal contours are within normal limits. Both lungs are clear. The visualized skeletal structures are unremarkable. IMPRESSION: No acute abnormality of the lungs. Electronically Signed   By: Eddie Candle M.D.   On: 03/11/2021 13:16   DG Chest 2 View  Result Date: 02/16/2021 CLINICAL DATA:  Weakness. EXAM: CHEST - 2 VIEW COMPARISON:  Chest x-ray dated February 01, 2021. FINDINGS: The heart size and mediastinal contours are within normal limits. Normal pulmonary vascularity. The lungs remain hyperinflated. Improving patchy bibasilar opacities. No pleural effusion or pneumothorax. No acute osseous abnormality. IMPRESSION: 1. Improving multifocal pneumonia. Electronically Signed   By: Titus Dubin M.D.   On: 02/16/2021 05:47   CT HEAD WO CONTRAST  Result Date: 03/11/2021 CLINICAL DATA:  Transient ischemic attack.  Slurred speech. EXAM: CT HEAD WITHOUT CONTRAST  TECHNIQUE: Contiguous axial images were obtained from the base of the skull through the vertex without intravenous contrast. COMPARISON:  07/16/2020 FINDINGS: Brain: There is no evidence for acute hemorrhage, hydrocephalus, mass lesion, or abnormal extra-axial fluid collection. No definite CT evidence for acute infarction. Patchy low attenuation in the deep hemispheric and periventricular white matter is nonspecific, but likely reflects chronic microvascular ischemic demyelination. Vascular: No hyperdense vessel or unexpected calcification. Skull: No evidence for fracture. No worrisome lytic or sclerotic lesion. Sinuses/Orbits: The visualized paranasal sinuses and mastoid air cells are clear. Visualized portions of the globes and intraorbital fat are unremarkable. Other: None. IMPRESSION: 1. Stable.  No acute intracranial abnormality. 2.  Chronic small vessel white matter ischemic disease. Electronically Signed   By: Misty Stanley M.D.   On: 03/11/2021 13:32   CT Angio Chest PE W and/or Wo Contrast  Result Date: 03/12/2021 CLINICAL DATA:  Positive D-dimer. EXAM: CT ANGIOGRAPHY CHEST WITH CONTRAST TECHNIQUE: Multidetector CT imaging of the chest was performed using the standard protocol during bolus administration of intravenous contrast. Multiplanar CT image reconstructions and MIPs were obtained to evaluate the vascular anatomy. CONTRAST:  22m OMNIPAQUE IOHEXOL 350 MG/ML SOLN COMPARISON:  October 08, 2015. FINDINGS: Cardiovascular: Satisfactory opacification of the pulmonary arteries to the segmental level. No evidence of pulmonary embolism. Normal heart size. No pericardial effusion. Atherosclerosis of thoracic aorta is noted without aneurysm formation. Mediastinum/Nodes: No enlarged mediastinal, hilar, or axillary lymph nodes. Thyroid gland, trachea, and esophagus demonstrate no significant findings. Lungs/Pleura: No pneumothorax or pleural effusion is noted. Stable biapical scarring is noted. Aspirated material  is noted in the trachea and right mainstem bronchus. Multiple ill-defined opacities are noted in both lung bases, left greater than right, concerning for multifocal pneumonia. Upper Abdomen: No acute abnormality. Musculoskeletal: No chest wall abnormality. No acute or significant osseous findings. Review of the MIP images confirms the above findings. IMPRESSION: No definite evidence of pulmonary embolus. Multiple ill-defined opacities are noted in both lung bases, left greater than right, concerning for multifocal pneumonia. Aspirated material is noted in the trachea and right mainstem bronchus. Aortic Atherosclerosis (ICD10-I70.0). Electronically Signed   By: JMarijo ConceptionM.D.   On: 03/12/2021 17:55   CT ABDOMEN PELVIS W CONTRAST  Result Date: 02/16/2021 CLINICAL DATA:  70year old male with abdominal pain and weakness. Recently diagnosed with pneumonia. EXAM: CT ABDOMEN AND PELVIS WITH CONTRAST TECHNIQUE: Multidetector CT imaging of the abdomen and pelvis was performed using the standard protocol following bolus administration of intravenous contrast. CONTRAST:  734mOMNIPAQUE IOHEXOL 350 MG/ML SOLN COMPARISON:  CT Abdomen and Pelvis 02/09/2020. Chest radiographs 0532 hours today. FINDINGS: Lower chest: No pericardial or pleural effusion. Small chronic hiatal hernia. Widely scattered peribronchial nodularity and ground-glass opacity in the lower lobes greater on the right. Chronic scarring in the lingula. No visible consolidation. Hepatobiliary: Negative liver and gallbladder. Pancreas: Negative. Spleen: Negative. Adrenals/Urinary Tract: Normal adrenal glands. Stable kidneys. Left greater than right midpole benign appearing renal cysts. Mild renal vascular calcifications. Symmetric renal contrast enhancement without obstruction. Diminutive and unremarkable bladder. Stomach/Bowel: No dilated large or small bowel. Redundant and gas-filled large bowel loops throughout the abdomen. No large bowel wall  thickening. Normal appendix on series 2, image 59. Gas-filled nondilated distal small bowel in the pelvis. Decompressed stomach. No free air or free fluid. No mesenteric inflammation identified. Vascular/Lymphatic: Extensive Aortoiliac calcified atherosclerosis. Major arterial structures remain patent in the abdomen and pelvis. There is early venous enhancement in the left common femoral vein at the femoral artery bifurcation level, similar to last year. No discrete vascular malformation is identified. Portal venous system is patent. No lymphadenopathy. Reproductive: Negative. Other: No pelvic free fluid. Musculoskeletal: Chronic disc and endplate degeneration at the lumbosacral junction. No acute osseous abnormality identified. IMPRESSION: 1. Bilateral lower lobe bronchopneumonia. No consolidation or pleural effusion. 2. Gas-filled but nondilated large and small bowel in the abdomen and pelvis might indicate Ileus. No focal bowel inflammation. Normal appendix. Chronic hiatal hernia. 3. Aortic Atherosclerosis (ICD10-I70.0). Chronic early contrast enhancement of the left common femoral vein raises the possibility of a left femoral vessel arteriovenous fistula. Electronically Signed   By: H Genevie Ann.D.   On:  02/16/2021 06:41   DG Chest Port 1 View  Result Date: 03/12/2021 CLINICAL DATA:  Weakness EXAM: PORTABLE CHEST 1 VIEW COMPARISON:  Chest radiograph 03/11/2021 FINDINGS: The cardiomediastinal silhouette is stable. There are patchy opacities in the lateral left base which are increased since yesterday's study. There is no other focal airspace disease. There is no pleural effusion or pneumothorax. There is no acute osseous abnormality. IMPRESSION: New patchy opacities in the lateral left base may reflect infection or aspiration in the correct clinical setting. Electronically Signed   By: Valetta Mole M.D.   On: 03/12/2021 15:15    Labs:  CBC: Recent Labs    03/14/21 0550 03/15/21 0509 03/16/21 0509  03/17/21 0521  WBC 4.1 3.9* 4.3 4.0  HGB 9.8* 9.8* 10.0* 9.3*  HCT 29.7* 30.1* 30.1* 29.1*  PLT 248 264 257 251    COAGS: Recent Labs    07/16/20 1538 02/02/21 0038 03/11/21 1157 03/12/21 2242  INR 1.2 1.4* 1.1 1.2  APTT 31 38* 31 40*    BMP: Recent Labs    03/14/21 0550 03/15/21 0509 03/16/21 0509 03/17/21 0521  NA 138 139 141 138  K 3.4* 3.6 3.6 3.1*  CL 106 110 112* 108  CO2 '26 25 26 25  '$ GLUCOSE 93 153* 137* 146*  BUN 9 6* 5* <5*  CALCIUM 8.1* 7.9* 8.1* 7.8*  CREATININE 0.85 0.82 0.89 0.74  GFRNONAA >60 >60 >60 >60    LIVER FUNCTION TESTS: Recent Labs    02/01/21 1209 02/16/21 0615 03/11/21 1157 03/12/21 1224  BILITOT 1.5* 1.2 0.9 0.9  AST '30 23 26 28  '$ ALT '14 23 17 14  '$ ALKPHOS 63 69 76 81  PROT 7.4 7.6 8.4* 8.4*  ALBUMIN 3.1* 3.2* 3.9 3.5    Assessment and Plan: 70 year old male with 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  Patient presented to ED 9/8 with weakness, respiratory failure due to multi-focal pneumonia and aspiration pneumonia, patient is afebrile and on Unasyn and Zithromax. S/p speech evaluation with severe dysphagia and aspiration, request received for image guided percutaneous gastrostomy tube placement. The patient has previously had a G-tube several years ago and is familiar.   Prior CT imaging has been reviewed by my attending and amenable to percutaneous approach, will place NGT today for contrast administration to opacify the colon for tomorrow's procedure. Tomorrow am KUB ordered.   Lovenox to be held tonight, labs and vitals have been reviewed.  Risks and benefits image guided gastrostomy tube placement was discussed with the patient including, but not limited bleeding, infection, peritonitis and/or damage to adjacent structures.  All of the patient's questions were answered, patient is agreeable to proceed.  Consent signed and in chart.   Thank you for this  interesting consult.  I greatly enjoyed meeting Eliana Friedel and look forward to participating in their care.  A copy of this report was sent to the requesting provider on this date.  Electronically Signed: Hedy Jacob, PA-C 03/17/2021, 3:24 PM   I spent a total of 40 Minutes in face to face in clinical consultation, greater than 50% of which was counseling/coordinating care for dysphagia.

## 2021-03-17 NOTE — TOC Progression Note (Signed)
Transition of Care [facility]) - Progression Note    Patient Details  Name: [name] MRN: [id number] Date of Birth: 1951/06/27  Transition [facility]) CM/SW [place], RN Phone Number: 03/17/2021, 9:06 AM  Clinical Narrative:   *Late entry from 16 Mar 2021*  Care team ordered palliative consult for patient due to swallowing/eating issues.  Tiffany, stepdaughter is patient's contact, states that she would like to speak to care team further about patient's care and disposition.  Care team advised.  Awaiting palliative consult for further discussion of goals and dispositon.  TOC contact information given, TOC to follow to discharge.  Expected Discharge Plan:  (TBD) Barriers to Discharge: Continued Medical Work up  Expected Discharge Plan and Services Expected Discharge Plan:  (TBD)       Living arrangements for the past 2 months: Single Family Home                 DME Arranged:  (TBD)                 Representative spoke with at [place]: Per Tommi Rumps at [facility], patient was assigned but not picked up due to home conditions   Social Determinants of Health (SDOH) Interventions    Readmission Risk Interventions Readmission Risk Prevention Plan 02/03/2021  Transportation Screening Complete  PCP or Specialist Appt within 3-5 Days Complete  HRI or [place] Complete  Social Work Consult for [place] Planning/Counseling Complete  Palliative Care Screening Not Applicable  Medication Review [employer]) Complete  Some recent data might be hidden

## 2021-03-17 NOTE — Consult Note (Addendum)
Consultation Note Date: 03/17/2021   Patient Name: Louis Stone  DOB: [birth year]  MRN: [id number]  Age / Sex: 70 y.o., male  PCP: Revelo, Elyse Jarvis, MD Referring Physician: Wyvonnia Dusky, MD  Reason for Consultation: Establishing goals of care and Psychosocial/spiritual support  HPI/Patient Profile: 71 y.o. 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   Clinical Assessment and Goals of Care: I have reviewed medical records including EPIC notes, labs and imaging, received report from RN, assessed the patient.  Louis Stone is sitting up quietly in bed.  He greets me, making and somewhat keeping eye contact.  He appears acutely/chronically ill and frail.  He is alert and oriented, able to make his basic needs known.  There is no family at bedside at this time.    I introduced Palliative Medicine as specialized medical care for people living with serious illness. It focuses on providing relief from the symptoms and stress of a serious illness. The goal is to improve quality of life for both the patient and the family.   We then discuss diagnosis prognosis, GOC, EOL wishes, disposition and options.   We discussed a brief life review of the patient.  Louis Stone is unmarried, he lives alone.  He tells me that he is independent with ADLs/IADLs including mowing his own lawn.   We then focused on their current illness.  Louis Stone tells me that he had treatment for oropharyngeal cancer in 2008 at [facility].  He shares that he was  recently checked for recurrence, but none was found.  He tells me that he "has a hole in his throat and the food is going into his windpipe".  We talked about expected PEG tube placement.  Louis Stone tells me that he had a PEG tube placed in 2008 with radiation therapy because they "knew my throat would be sore".  He tells me that he is experience with managing his own PEG tube.  He is agreeable to PEG tube placement, and is hopeful that this will be able to be done today.  He seems perturbed when I share that it may be tomorrow.  I shared that it seems he is done well with physical therapy, does not need any rehab services, and since he is experienced with PEG to he is not likely to need home health either.  He agrees.  We also talked about his sacral wound, aspiration pneumonia.  Advanced directives, concepts specific to code status, were considered and discussed.  We talked about the concept of "treat the treatable, but allowing natural passing".  At this point Louis Stone desires full scope/full code.  I shared that if he were to be on life support,  how long would he accept this treatment, 2 days, 2 weeks?  I shared that after few weeks he has to have a tracheostomy.  I greatly encouraged him to share his choices with his healthcare surrogate, his sister, Yarin Binger.  Palliative Care services outpatient were explained and offered.  At this point Louis Stone declines palliative services.  Discussed the importance of continued conversation with family and the medical providers regarding overall plan of care and treatment options, ensuring decisions are within the context of the patient's values and GOCs.  Questions and concerns were addressed.  The family was encouraged to call with questions or concerns.  PMT will continue to support holistically.  Conference with attending, bedside nursing staff, transition of care team related to patient condition, needs, goals of care,  disposition.  HCPOA  NEXT OF KIN -Louis Stone names his sister, [name], as his healthcare surrogate.  He is unmarried.    SUMMARY OF RECOMMENDATIONS   At this point full scope/full code Agreeable to PEG tube placement Discharge to home, if no needs identified Declines outpatient palliative services   Code Status/Advance Care Planning: Full code -we talked about the concept of "treat the treatable but allowing natural passing".  Symptom Management:  Per hospitalist, no additional needs at this time.  Palliative Prophylaxis:  Frequent Pain Assessment and Oral Care  Additional Recommendations (Limitations, Scope, Preferences): Full Scope Treatment  Psycho-social/Spiritual:  Desire for further Chaplaincy support:no Additional Recommendations: Caregiving  Support/Resources and Education on Hospice  Prognosis:  Unable to determine, based on outcomes.  6 months or less would not be surprising based on frailty, poor nutritional intake.   Discharge Planning:  Home, no needs identified, declines outpatient palliative.       Primary Diagnoses: Present on Admission:  Aspiration pneumonia ([place])   I have reviewed the medical record, interviewed the patient and family, and examined the patient. The following aspects are pertinent.  Past Medical History:  Diagnosis Date   Arthritis    per pt. I do not have md notes that indicate dx.   Diabetes mellitus without complication ([place])    Oropharynx cancer ([place]) 2009   squamous cell cancer  Stage T3, N2c, M0   Personal history of tobacco use, presenting hazards to health 10/08/2015   Social History   Socioeconomic History   Marital status: Single    Spouse name: Not on file   Number of children: Not on file   Years of education: Not on file   Highest education level: Not on file  Occupational History   Not on file  Tobacco Use   Smoking status: Former    Packs/day: 1.50    Years: 40.00    Pack years: 60.00     Types: Cigarettes    Quit date: 07/05/2006    Years since quitting: 14.7   Smokeless tobacco: Never  Substance and Sexual Activity   Alcohol use: No    Comment: quit in 1991   Drug use: No   Sexual activity: Not on file  Other Topics Concern   Not on file  Social History Narrative   Not on file   Social Determinants of Health   Financial Resource Strain: Not on file  Food Insecurity: Not on file  Transportation Needs: Not on file  Physical Activity: Not on file  Stress: Not on file  Social Connections: Not on file   Family History  Problem Relation Age of Onset   CVA Father    Prostate cancer  Neg Hx        not sure what family member   Scheduled Meds:  atorvastatin  20 mg Oral Daily   enoxaparin (LOVENOX) injection  40 mg Subcutaneous Q24H   famotidine (PEPCID) IV  20 mg Intravenous Q12H   guaiFENesin  600 mg Oral BID   insulin aspart  0-6 Units Subcutaneous Q4H   lisinopril  5 mg Oral Daily   pantoprazole (PROTONIX) IV  40 mg Intravenous Daily   sodium chloride flush  3 mL Intravenous Q12H   Continuous Infusions:  sodium chloride     ampicillin-sulbactam (UNASYN) IV Stopped (03/17/21 0607)   dextrose 5 % and 0.9% NaCl 100 mL/hr at 03/17/21 0752   PRN Meds:.sodium chloride, acetaminophen **OR** acetaminophen, albuterol, benzonatate, chlorpheniramine-HYDROcodone, magnesium hydroxide, ondansetron **OR** ondansetron (ZOFRAN) IV, simethicone, sodium chloride flush, traZODone Medications Prior to Admission:  Prior to Admission medications   Medication Sig Start Date End Date Taking? Authorizing Provider  acetaminophen (TYLENOL) 325 MG tablet Take 650 mg by mouth every 6 (six) hours as needed for headache or mild pain.   Yes [provider]  albuterol (PROVENTIL HFA;VENTOLIN HFA) 108 (90 Base) MCG/ACT inhaler Inhale 2 puffs into the lungs every 6 (six) hours as needed for wheezing.   Yes [provider]  benzonatate (TESSALON) 200 MG capsule Take 200 mg by  mouth 3 (three) times daily as needed for cough.   Yes [provider]  famotidine (PEPCID) 40 MG tablet Take 40 mg by mouth daily.   Yes [provider]  lisinopril (ZESTRIL) 5 MG tablet Take 5 mg by mouth daily. 06/06/20  Yes [provider]  omeprazole (PRILOSEC) 40 MG capsule Take 40 mg by mouth daily. 06/06/20  Yes [provider]  TOUJEO SOLOSTAR 300 UNIT/ML Solostar Pen Inject 15 Units into the skin daily. 07/21/20  Yes Dahal, Marlowe Aschoff, MD  ACCU-CHEK AVIVA PLUS test strip  09/04/20   [provider]  atorvastatin (LIPITOR) 20 MG tablet Take 20 mg by mouth daily. Patient not taking: Reported on 03/12/2021 06/06/20   [provider]  guaiFENesin 200 MG tablet Take 1 tablet (200 mg total) by mouth every 6 (six) hours as needed for cough or to loosen phlegm. 03/11/21   Triplett, Dessa Phi, FNP   No Known Allergies Review of Systems  Unable to perform ROS: Other   Physical Exam Vitals and nursing note reviewed.  Constitutional:      Appearance: He is ill-appearing.  Cardiovascular:     Rate and Rhythm: Normal rate.  Pulmonary:     Effort: Pulmonary effort is normal.  Musculoskeletal:     Comments: Frail and thin, cachectic  Skin:    General: Skin is warm and dry.  Neurological:     Mental Status: He is alert and oriented to person, place, and time.    Vital Signs: BP 129/75 (BP Location: Right Arm)   Pulse (!) 52   Temp 98.4 F (36.9 C) (Oral)   Resp 16   Ht '5\' 3"'$  (1.6 m)   Wt 54.3 kg   SpO2 100%   BMI 21.21 kg/m  Pain Scale: 0-10   Pain Score: 3    SpO2: SpO2: 100 % O2 Device:SpO2: 100 % O2 Flow Rate: .   IO: Intake/output summary:  Intake/Output Summary (Last 24 hours) at 03/17/2021 1253 Last data filed at 03/17/2021 0752 Gross per 24 hour  Intake 3133.05 ml  Output 750 ml  Net 2383.05 ml    LBM: Last  BM Date:  (unknown) Baseline Weight: Weight: 70 kg Most recent weight: Weight: 54.3 kg     Palliative  Assessment/Data:   Flowsheet Rows    Flowsheet Row Most Recent Value  Intake Tab   Referral Department Hospitalist  Unit at Time of Referral Intermediate Care Unit  Palliative Care Primary Diagnosis Pulmonary  Date Notified 03/16/21  Palliative Care Type New Palliative care  Reason for referral Clarify Goals of Care  Date of Admission 03/12/21  Date first seen by Palliative Care 03/17/21  # of days Palliative referral response time 1 Day(s)  # of days IP prior to Palliative referral 4  Clinical Assessment   Palliative Performance Scale Score 30%  Pain Max last 24 hours Not able to report  Pain Min Last 24 hours Not able to report  Dyspnea Max Last 24 Hours Not able to report  Dyspnea Min Last 24 hours Not able to report  Psychosocial & Spiritual Assessment   Palliative Care Outcomes        Time In: 0830 Time Out: 0940 Time Total: 70 minutes  Greater than 50%  of this time was spent counseling and coordinating care related to the above assessment and plan.  Signed by: Drue Novel, NP   Please contact Palliative Medicine Team phone at [phone] for questions and concerns.  For individual provider: See Shea Evans

## 2021-03-18 ENCOUNTER — Inpatient Hospital Stay: Payer: Medicare Other | Admitting: Radiology

## 2021-03-18 ENCOUNTER — Inpatient Hospital Stay: Payer: Medicare Other

## 2021-03-18 ENCOUNTER — Encounter: Payer: Medicare Other | Admitting: Speech Pathology

## 2021-03-18 DIAGNOSIS — J690 Pneumonitis due to inhalation of food and vomit: Secondary | ICD-10-CM

## 2021-03-18 DIAGNOSIS — Z7189 Other specified counseling: Secondary | ICD-10-CM | POA: Diagnosis not present

## 2021-03-18 DIAGNOSIS — Z515 Encounter for palliative care: Secondary | ICD-10-CM | POA: Diagnosis not present

## 2021-03-18 DIAGNOSIS — J9601 Acute respiratory failure with hypoxia: Secondary | ICD-10-CM | POA: Diagnosis not present

## 2021-03-18 HISTORY — PX: IR GASTROSTOMY TUBE MOD SED: IMG625

## 2021-03-18 LAB — CBC
HCT: 30.9 % — ABNORMAL LOW (ref 39.0–52.0)
Hemoglobin: 9.9 g/dL — ABNORMAL LOW (ref 13.0–17.0)
MCH: 26.3 pg (ref 26.0–34.0)
MCHC: 32 g/dL (ref 30.0–36.0)
MCV: 82 fL (ref 80.0–100.0)
Platelets: 231 10*3/uL (ref 150–400)
RBC: 3.77 MIL/uL — ABNORMAL LOW (ref 4.22–5.81)
RDW: 14.1 % (ref 11.5–15.5)
WBC: 4.4 10*3/uL (ref 4.0–10.5)
nRBC: 0 % (ref 0.0–0.2)

## 2021-03-18 LAB — BASIC METABOLIC PANEL
Anion gap: 5 (ref 5–15)
BUN: <5 mg/dL — ABNORMAL LOW (ref 8–23)
CO2: 25 mmol/L (ref 22–32)
Calcium: 7.8 mg/dL — ABNORMAL LOW (ref 8.9–10.3)
Chloride: 109 mmol/L (ref 98–111)
Creatinine, Ser: 0.8 mg/dL (ref 0.61–1.24)
GFR, Estimated: >60 mL/min (ref >60)
Glucose, Bld: 102 mg/dL — ABNORMAL HIGH (ref 70–99)
Potassium: 3.2 mmol/L — ABNORMAL LOW (ref 3.5–5.1)
Sodium: 139 mmol/L (ref 135–145)

## 2021-03-18 LAB — GLUCOSE, CAPILLARY
Glucose-Capillary: 102 mg/dL — ABNORMAL HIGH (ref 70–99)
Glucose-Capillary: 121 mg/dL — ABNORMAL HIGH (ref 70–99)

## 2021-03-18 LAB — MAGNESIUM: Magnesium: 1.7 mg/dL (ref 1.7–2.4)

## 2021-03-18 MED ORDER — ACETAMINOPHEN 325 MG PO TABS
650.0000 mg | ORAL_TABLET | Freq: Four times a day (QID) | ORAL | Status: DC | PRN
  Administered 2021-03-19 – 2021-03-20 (×2): 650 mg
  Filled 2021-03-18 (×2): qty 2

## 2021-03-18 MED ORDER — KCL IN DEXTROSE-NACL 20-5-0.45 MEQ/L-%-% IV SOLN
INTRAVENOUS | Status: DC
  Filled 2021-03-18 (×4): qty 1000

## 2021-03-18 MED ORDER — GLUCAGON HCL RDNA (DIAGNOSTIC) 1 MG IJ SOLR
INTRAMUSCULAR | Status: AC
  Filled 2021-03-18: qty 1

## 2021-03-18 MED ORDER — FENTANYL CITRATE (PF) 100 MCG/2ML IJ SOLN
INTRAMUSCULAR | Status: AC
  Filled 2021-03-18: qty 2

## 2021-03-18 MED ORDER — MAGNESIUM SULFATE 2 GM/50ML IV SOLN
2.0000 g | Freq: Once | INTRAVENOUS | Status: AC
  Administered 2021-03-18: 2 g via INTRAVENOUS
  Filled 2021-03-18: qty 50

## 2021-03-18 MED ORDER — FENTANYL CITRATE (PF) 100 MCG/2ML IJ SOLN
INTRAMUSCULAR | Status: DC | PRN
  Administered 2021-03-18: 25 ug via INTRAVENOUS

## 2021-03-18 MED ORDER — POTASSIUM CHLORIDE IN NACL 20-0.9 MEQ/L-% IV SOLN
INTRAVENOUS | Status: DC
  Filled 2021-03-18 (×2): qty 1000

## 2021-03-18 MED ORDER — MIDAZOLAM HCL 2 MG/2ML IJ SOLN
INTRAMUSCULAR | Status: AC
  Filled 2021-03-18: qty 2

## 2021-03-18 MED ORDER — MAGNESIUM HYDROXIDE 400 MG/5ML PO SUSP
30.0000 mL | Freq: Every day | ORAL | Status: DC | PRN
  Filled 2021-03-18: qty 30

## 2021-03-18 MED ORDER — ATORVASTATIN CALCIUM 20 MG PO TABS
20.0000 mg | ORAL_TABLET | Freq: Every day | ORAL | Status: DC
  Administered 2021-03-19 – 2021-03-21 (×3): 20 mg
  Filled 2021-03-18 (×3): qty 1

## 2021-03-18 MED ORDER — LISINOPRIL 20 MG PO TABS: 20.0000 mg | ORAL_TABLET | Freq: Every day | ORAL | Status: DC

## 2021-03-18 MED ORDER — LIDOCAINE HCL 1 % IJ SOLN
INTRAMUSCULAR | Status: AC
  Administered 2021-03-18: 20 mL
  Filled 2021-03-18: qty 20

## 2021-03-18 MED ORDER — ONDANSETRON HCL 4 MG PO TABS: 4.0000 mg | ORAL_TABLET | Freq: Four times a day (QID) | ORAL | Status: DC | PRN

## 2021-03-18 MED ORDER — SIMETHICONE 40 MG/0.6ML PO SUSP
40.0000 mg | Freq: Four times a day (QID) | ORAL | Status: DC | PRN
  Filled 2021-03-18: qty 0.6

## 2021-03-18 MED ORDER — ONDANSETRON HCL 4 MG/2ML IJ SOLN: 4.0000 mg | Freq: Four times a day (QID) | INTRAMUSCULAR | Status: DC | PRN

## 2021-03-18 MED ORDER — IOHEXOL 300 MG/ML  SOLN
15.0000 mL | Freq: Once | INTRAMUSCULAR | Status: AC | PRN
  Administered 2021-03-18: 15 mL

## 2021-03-18 MED ORDER — TRAZODONE HCL 50 MG PO TABS: 25.0000 mg | ORAL_TABLET | Freq: Every evening | ORAL | Status: DC | PRN

## 2021-03-18 MED ORDER — MIDAZOLAM HCL 2 MG/2ML IJ SOLN
INTRAMUSCULAR | Status: DC | PRN
  Administered 2021-03-18: .5 mg via INTRAVENOUS

## 2021-03-18 MED ORDER — LISINOPRIL 20 MG PO TABS
20.0000 mg | ORAL_TABLET | Freq: Every day | ORAL | Status: DC
  Administered 2021-03-19 – 2021-03-21 (×3): 20 mg
  Filled 2021-03-18 (×3): qty 1

## 2021-03-18 MED ORDER — HYDROCOD POLST-CPM POLST ER 10-8 MG/5ML PO SUER: 5.0000 mL | Freq: Two times a day (BID) | ORAL | Status: DC | PRN

## 2021-03-18 MED ORDER — ACETAMINOPHEN 650 MG RE SUPP: 650.0000 mg | Freq: Four times a day (QID) | RECTAL | Status: DC | PRN

## 2021-03-18 NOTE — Progress Notes (Addendum)
PROGRESS NOTE   HPI was taken from Dr. Sidney Ace: Louis Stone is a 70 y.o. African-American male with medical history significant for osteoarthritis, type 2 diabetes mellitus, oropharyngeal stage IV squamous cell carcinoma, status post chemotherapy and radiotherapy and recent admission from 7/31 till 8/5 for acute hypoxic respiratory failure due to multifocal pneumonia as well as ED visits recently including 8/15, who presented to the emergency room with acute onset of generalized weakness.  The patient refused SNF placement on 8/15.  He is currently feeling so weak he can hardly stand up without help.  He admitted to cough productive of whitish sputum without wheezing however with mild dyspnea.  No recent falls or injuries.  He has mild headache without dizziness or blurred vision.  No chest pain or palpitations.  No nausea or vomiting or diarrhea or abdominal pain.  No dysuria, oliguria or hematuria or flank pain.    Hospital course from Dr. Jimmye Norman 9/9-9/13/22: Pt was found to have aspiration pneumonia and has been on IV unasyn. Speech was consulted and swallow study showed severe oropharyngeal phase dysphagia w/ aspiration with liquids. Discussed case w/ ENT, Dr. Freda Munro, and pt was seen in May by Dr. 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 Not surprising pt has swallowing issues after treatment as per ENT. May need a PEG tube. After several days of NPO, speech still recommended PEG tube. After multiple discussions, the pt is agreeable to PEG tube placement. IR, Dr. Dwaine Gale, was consulted , plan for PEG placement 9/14    Louis Stone  [id number] DOB: 1951-06-03 DOA: 03/12/2021 PCP: Theotis Burrow, MD   Assessment & Plan:   Active Problems:   Aspiration pneumonia ([place])  # Aspiration pneumonia # Sepsis Resolved. Sepsis criteria by tachycardia, tachypnea.  Today is day 6 of abx (unasyn), I will discontinue. 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 Swallow study w/ severe dysphagia. Followed by SLP. Plan for IR PEG placement today. Unfortunately does not qualify for home health given hx roaches and bed bugs in home. Patient has had PEG tube in the past and thinks he can manage it - IR PEG placement today - palliative consulted, pt wants full scope of care for now - RD following for tube feeds  # Hypokalemia K 3.2 - add k to fluids - f/u mg  # T2DM Glucose wnl, has not required insulin - stop sliding scale, will monitor daily fasting sugars  # Normocytic anemia Mild and stable - monitor   # HLD: continue on statin   # HTN Bp elevated - incr lisinopril to 20  GERD: continue on PPI   Oropharyngeal squamous cell carcinoma: stage IV.  - outpt ent f/u    DVT prophylaxis: lovenox  Code Status: full  Family Communication: none @ bedside  Disposition Plan: likely d/c back home   Level of care: Med-Surg  Status is: Inpatient  Remains inpatient appropriate because:Unsafe d/c plan, IV treatments appropriate due to intensity of illness or inability to take PO, and Inpatient level of care appropriate due to severity of illness, will likely have PEG tube placed by IR tomorrow   Dispo: The patient is from: Home              Anticipated d/c is to: Home              Patient  currently is not medically stable to d/c.   Difficult to place patient Yes    Consultants:  Palliative care   Procedures:   Antimicrobials: s/p 6 days unasyn   Subjective: No complaints. Is hungry. No pain. No fever. No cough or dyspnea  Objective: Vitals:   03/17/21 1745 03/17/21 2034 03/18/21 0445 03/18/21 0735  BP: (!) 176/96 (!) 149/79 126/71 112/66  Pulse: 60 62 62 62  Resp: '18 16 16 16  '$ Temp: 97.6 F (36.4 C) 98.3 F (36.8 C) 98.1 F (36.7 C) 98.3 F (36.8 C)  TempSrc: Oral   Oral  SpO2: 98% 98% 97% 97%   Weight:      Height:        Intake/Output Summary (Last 24 hours) at 03/18/2021 1034 Last data filed at 03/17/2021 2035 Gross per 24 hour  Intake 1426.39 ml  Output 550 ml  Net 876.39 ml   Filed Weights   03/12/21 1227 03/12/21 2105  Weight: 70 kg 54.3 kg    Examination:  General exam: Appears calm. Appears older than stated age Respiratory system: diminished breath sounds b/l bases Cardiovascular system: S1/S2+. No rubs or clicks  Gastrointestinal system: Abd is soft, NT, ND & hypoactive bowel sounds Central nervous system: Alert and oriented. Moves all extremities   Psychiatry: Judgement and insight appear poor. Flat mood and affect    Data Reviewed: I have personally reviewed following labs and imaging studies  CBC: Recent Labs  Lab 03/11/21 1157 03/12/21 1224 03/13/21 0515 03/14/21 0550 03/15/21 0509 03/16/21 0509 03/17/21 0521 03/18/21 0400  WBC 8.2 8.2   < > 4.1 3.9* 4.3 4.0 4.4  NEUTROABS 7.5 7.5  --   --   --   --   --   --   HGB 11.9* 12.4*   < > 9.8* 9.8* 10.0* 9.3* 9.9*  HCT 35.1* 37.8*   < > 29.7* 30.1* 30.1* 29.1* 30.9*  MCV 81.6 81.1   < > 83.4 82.9 83.4 82.9 82.0  PLT 274 304   < > 248 264 257 251 231   < > = values in this interval not displayed.   Basic Metabolic Panel: Recent Labs  Lab 03/14/21 0550 03/15/21 0509 03/16/21 0509 03/17/21 0521 03/18/21 0400  NA 138 139 141 138 139  K 3.4* 3.6 3.6 3.1* 3.2*  CL 106 110 112* 108 109  CO2 '26 25 26 25 25  '$ GLUCOSE 93 153* 137* 146* 102*  BUN 9 6* 5* <5* <5*  CREATININE 0.85 0.82 0.89 0.74 0.80  CALCIUM 8.1* 7.9* 8.1* 7.8* 7.8*   GFR: Estimated Creatinine Clearance: 67.9 mL/min (by C-G formula based on SCr of 0.8 mg/dL). Liver Function Tests: Recent Labs  Lab 03/11/21 1157 03/12/21 1224  AST 26 28  ALT 17 14  ALKPHOS 76 81  BILITOT 0.9 0.9  PROT 8.4* 8.4*  ALBUMIN 3.9 3.5   No results for input(s): LIPASE, AMYLASE in the last 168 hours. No results for input(s): AMMONIA in the  last 168 hours. Coagulation Profile: Recent Labs  Lab 03/11/21 1157 03/12/21 2242  INR 1.1 1.2   Cardiac Enzymes: No results for input(s): CKTOTAL, CKMB, CKMBINDEX, TROPONINI in the last 168 hours. BNP (last 3 results) No results for input(s): PROBNP in the last 8760 hours. HbA1C: No results for input(s): HGBA1C in the last 72 hours. CBG: Recent Labs  Lab 03/17/21 1204 03/17/21 1746 03/17/21 2036 03/18/21 0443 03/18/21 0847  GLUCAP 123* 103* 115* 102* 121*   Lipid Profile: No  results for input(s): CHOL, HDL, LDLCALC, TRIG, CHOLHDL, LDLDIRECT in the last 72 hours. Thyroid Function Tests: No results for input(s): TSH, T4TOTAL, FREET4, T3FREE, THYROIDAB in the last 72 hours. Anemia Panel: No results for input(s): VITAMINB12, FOLATE, FERRITIN, TIBC, IRON, RETICCTPCT in the last 72 hours. Sepsis Labs: Recent Labs  Lab 03/12/21 1500 03/12/21 2242 03/13/21 0515  PROCALCITON 0.16  --   --   LATICACIDVEN  --  2.1* 1.3    Recent Results (from the past 240 hour(s))  Resp Panel by RT-PCR (Flu A&B, Covid) Nasopharyngeal Swab     Status: None   Collection Time: 03/11/21 12:50 PM   Specimen: Nasopharyngeal Swab; Nasopharyngeal(NP) swabs in vial transport medium  Result Value Ref Range Status   SARS Coronavirus 2 by RT PCR NEGATIVE NEGATIVE Final    Comment: (NOTE) SARS-CoV-2 target nucleic acids are NOT DETECTED.  The SARS-CoV-2 RNA is generally detectable in upper respiratory specimens during the acute phase of infection. The lowest concentration of SARS-CoV-2 viral copies this assay can detect is 138 copies/mL. A negative result does not preclude SARS-Cov-2 infection and should not be used as the sole basis for treatment or other patient management decisions. A negative result may occur with  improper specimen collection/handling, submission of specimen other than nasopharyngeal swab, presence of viral mutation(s) within the areas targeted by this assay, and inadequate  number of viral copies(<138 copies/mL). A negative result must be combined with clinical observations, patient history, and epidemiological information. The expected result is Negative.  Fact Sheet for Patients:  EntrepreneurPulse.com.au  Fact Sheet for Healthcare Providers:  IncredibleEmployment.be  This test is no t yet approved or cleared by the [place] FDA and  has been authorized for detection and/or diagnosis of SARS-CoV-2 by FDA under an Emergency Use Authorization (EUA). This EUA will remain  in effect (meaning this test can be used) for the duration of the COVID-19 declaration under Section 564(b)(1) of the Act, 21 U.S.C.section 360bbb-3(b)(1), unless the authorization is terminated  or revoked sooner.       Influenza A by PCR NEGATIVE NEGATIVE Final   Influenza B by PCR NEGATIVE NEGATIVE Final    Comment: (NOTE) The Xpert Xpress SARS-CoV-2/FLU/RSV plus assay is intended as an aid in the diagnosis of influenza from Nasopharyngeal swab specimens and should not be used as a sole basis for treatment. Nasal washings and aspirates are unacceptable for Xpert Xpress SARS-CoV-2/FLU/RSV testing.  Fact Sheet for Patients: EntrepreneurPulse.com.au  Fact Sheet for Healthcare Providers: IncredibleEmployment.be  This test is not yet approved or cleared by the [place] FDA and has been authorized for detection and/or diagnosis of SARS-CoV-2 by FDA under an Emergency Use Authorization (EUA). This EUA will remain in effect (meaning this test can be used) for the duration of the COVID-19 declaration under Section 564(b)(1) of the Act, 21 U.S.C. section 360bbb-3(b)(1), unless the authorization is terminated or revoked.  Performed at [facility], [address] [place], [place] [address]   Resp Panel by RT-PCR (Flu A&B, Covid) Nasopharyngeal Swab     Status: None   Collection Time:  03/12/21  3:00 PM   Specimen: Nasopharyngeal Swab; Nasopharyngeal(NP) swabs in vial transport medium  Result Value Ref Range Status   SARS Coronavirus 2 by RT PCR NEGATIVE NEGATIVE Final    Comment: (NOTE) SARS-CoV-2 target nucleic acids are NOT DETECTED.  The SARS-CoV-2 RNA is generally detectable in upper respiratory specimens during the acute phase of infection. The lowest concentration of SARS-CoV-2 viral copies this  assay can detect is 138 copies/mL. A negative result does not preclude SARS-Cov-2 infection and should not be used as the sole basis for treatment or other patient management decisions. A negative result may occur with  improper specimen collection/handling, submission of specimen other than nasopharyngeal swab, presence of viral mutation(s) within the areas targeted by this assay, and inadequate number of viral copies(<138 copies/mL). A negative result must be combined with clinical observations, patient history, and epidemiological information. The expected result is Negative.  Fact Sheet for Patients:  EntrepreneurPulse.com.au  Fact Sheet for Healthcare Providers:  IncredibleEmployment.be  This test is no t yet approved or cleared by the [place] FDA and  has been authorized for detection and/or diagnosis of SARS-CoV-2 by FDA under an Emergency Use Authorization (EUA). This EUA will remain  in effect (meaning this test can be used) for the duration of the COVID-19 declaration under Section 564(b)(1) of the Act, 21 U.S.C.section 360bbb-3(b)(1), unless the authorization is terminated  or revoked sooner.       Influenza A by PCR NEGATIVE NEGATIVE Final   Influenza B by PCR NEGATIVE NEGATIVE Final    Comment: (NOTE) The Xpert Xpress SARS-CoV-2/FLU/RSV plus assay is intended as an aid in the diagnosis of influenza from Nasopharyngeal swab specimens and should not be used as a sole basis for treatment. Nasal washings  and aspirates are unacceptable for Xpert Xpress SARS-CoV-2/FLU/RSV testing.  Fact Sheet for Patients: EntrepreneurPulse.com.au  Fact Sheet for Healthcare Providers: IncredibleEmployment.be  This test is not yet approved or cleared by the [place] FDA and has been authorized for detection and/or diagnosis of SARS-CoV-2 by FDA under an Emergency Use Authorization (EUA). This EUA will remain in effect (meaning this test can be used) for the duration of the COVID-19 declaration under Section 564(b)(1) of the Act, 21 U.S.C. section 360bbb-3(b)(1), unless the authorization is terminated or revoked.  Performed at [facility], [address] [place], [place] [address]   Culture, blood (Routine X 2) w Reflex to ID Panel     Status: None   Collection Time: 03/12/21 10:42 PM   Specimen: BLOOD  Result Value Ref Range Status   Specimen Description BLOOD LEFT FOREARM  Final   Special Requests   Final    BOTTLES DRAWN AEROBIC AND ANAEROBIC Blood Culture adequate volume   Culture   Final    NO GROWTH 5 DAYS Performed at [facility], [address] [place], [place] [address]    Report Status 03/17/2021 FINAL  Final  Culture, blood (Routine X 2) w Reflex to ID Panel     Status: None   Collection Time: 03/12/21 10:44 PM   Specimen: BLOOD  Result Value Ref Range Status   Specimen Description BLOOD LEFT ASSIST CONTROL  Final   Special Requests   Final    BOTTLES DRAWN AEROBIC ONLY Blood Culture adequate volume   Culture   Final    NO GROWTH 5 DAYS Performed at [facility], [address] [place], [place] [address]    Report Status 03/17/2021 FINAL  Final         Radiology Studies: DG Abd Portable 1V  Result Date: 03/18/2021 CLINICAL DATA:  Pre gastrostomy tube placement EXAM: PORTABLE ABDOMEN - 1 VIEW COMPARISON:  None. FINDINGS: There is contrast material within the large bowel. An enteric tube is seen  overlying the mid abdomen, either in the gastric body, or possibly post pyloric. Numerous gas-filled loops of bowel are noted, which are normal  in caliber. No acute osseous abnormality. IMPRESSION: There is contrast material seen in the large bowel. Numerous nondilated air-filled loops of bowel. Electronically Signed   By: Albin Felling M.D.   On: 03/18/2021 08:24   DG Loyce Dys Tube Plc W/Fl W/Rad  Result Date: 03/17/2021 CLINICAL DATA:  NG tube placement prior to G-tube placement. EXAM: NASO G TUBE PLACEMENT WITH FL AND WITH RAD CONTRAST:  75 mL Omnipaque 300 FLUOROSCOPY TIME:  Fluoroscopy Time:  12 seconds Radiation Exposure Index (if provided by the fluoroscopic device): 1 mGy Number of Acquired Spot Images: I COMPARISON:  None. FINDINGS: A 10 French nasogastric tube was placed through the left nostril and advanced into the stomach without difficulty. The tube was secured to the nose. 75 mL of Omnipaque 300 was hand injected through the nasogastric tube opacifying the stomach and proximal small bowel. IMPRESSION: Successful placement of a nasogastric tube with the tip in the proximal duodenum. Electronically Signed   By: Kathreen Devoid M.D.   On: 03/17/2021 17:01        Scheduled Meds:  atorvastatin  20 mg Oral Daily   enoxaparin (LOVENOX) injection  40 mg Subcutaneous Q24H   famotidine (PEPCID) IV  20 mg Intravenous Q12H   guaiFENesin  600 mg Oral BID   insulin aspart  0-6 Units Subcutaneous Q4H   lisinopril  5 mg Oral Daily   pantoprazole (PROTONIX) IV  40 mg Intravenous Daily   sodium chloride flush  3 mL Intravenous Q12H   Continuous Infusions:  sodium chloride     0.9 % NaCl with KCl 20 mEq / L     dextrose 5 % and 0.9% NaCl 100 mL/hr at 03/18/21 0946     LOS: 6 days    Time spent: 35  mins     Desma Maxim, MD [facility] Pager [phone]  If 7PM-7AM, please contact night-coverage [email or web address] 03/18/2021, 10:34 AM

## 2021-03-18 NOTE — Progress Notes (Signed)
Palliative: Mr. Rabinowitz is sitting up quietly in bed.  He greets me, making and somewhat contact.  He appears acutely/chronically ill and frail, cachectic.  He is alert and oriented, able to make his basic needs known.  There is no family at bedside at this time.  We talked about the plan for PEG tube today.  Mr. Schirra has had PEG tube placed around 2008 for head and neck cancer treatment.  His experience with managing feeding and tube maintenance.  Denies questions or concerns today, stating that he is ready to have the procedure and get home.  Conference with attending, bedside nursing staff, transition of care team related to patient condition, needs, goals of care, disposition.  Plan: PEG tube placement today.  Home, do not anticipate home health needs.  Full scope/full code.  Declines outpatient palliative services at this time.  15 minutes Quinn Axe, NP Palliative medicine team Team phone [phone] Greater than 50% of this time was spent counseling and coordinating care related to the above assessment and plan.

## 2021-03-18 NOTE — Procedures (Signed)
Interventional Radiology Procedure Note  Date of Procedure: 03/18/2021  Procedure: Image guided percutaneous gastrostomy tube placement   Findings:  1. Successful placement of an 18 Fr percutaneous balloon gastrostomy tube using fluoroscopic guidance    Complications: No immediate complications noted.   Estimated Blood Loss: minimal  Follow-up and Recommendations: 1. G tube may be used immediately for meds, and next morning 0800 for tube feeds.    Albin Felling, MD  Vascular & Interventional Radiology  03/18/2021 1:54 PM

## 2021-03-18 NOTE — TOC Progression Note (Signed)
Transition of Care [facility]) - Progression Note    Patient Details  Name: Louis Stone MRN: [id number] Date of Birth: 1950/12/15  Transition of Care [facility]) CM/SW [place], RN Phone Number: 03/18/2021, 3:31 PM  Clinical Narrative:  Bevely Palmer placed today.  Patient will not need HH follow up as per care team.   Awaiting tube feed orders, will assist patient to arrange getting feedings.  TOC contact information given, tOC to follow    Expected Discharge Plan:  (TBD) Barriers to Discharge: Continued Medical Work up  Expected Discharge Plan and Services Expected Discharge Plan:  (TBD)       Living arrangements for the past 2 months: Single Family Home                 DME Arranged:  (TBD)                 Representative spoke with at [place]: Per Tommi Rumps at [place], patient was assigned but not picked up due to home conditions   Social Determinants of Health (SDOH) Interventions    Readmission Risk Interventions Readmission Risk Prevention Plan 02/03/2021  Transportation Screening Complete  PCP or Specialist Appt within 3-5 Days Complete  HRI or [place] Complete  Social Work Consult for [place] Planning/Counseling Complete  Palliative Care Screening Not Applicable  Medication Review Press photographer) Complete  Some recent data might be hidden

## 2021-03-19 DIAGNOSIS — J690 Pneumonitis due to inhalation of food and vomit: Secondary | ICD-10-CM | POA: Diagnosis not present

## 2021-03-19 DIAGNOSIS — Z931 Gastrostomy status: Secondary | ICD-10-CM | POA: Diagnosis not present

## 2021-03-19 DIAGNOSIS — Z7189 Other specified counseling: Secondary | ICD-10-CM | POA: Diagnosis not present

## 2021-03-19 DIAGNOSIS — Z515 Encounter for palliative care: Secondary | ICD-10-CM | POA: Diagnosis not present

## 2021-03-19 LAB — PHOSPHORUS
Phosphorus: 2.6 mg/dL (ref 2.5–4.6)
Phosphorus: 2.4 mg/dL — ABNORMAL LOW (ref 2.5–4.6)

## 2021-03-19 LAB — GLUCOSE, RANDOM: Glucose, Bld: 122 mg/dL — ABNORMAL HIGH (ref 70–99)

## 2021-03-19 LAB — BASIC METABOLIC PANEL
Anion gap: 6 (ref 5–15)
BUN: <5 mg/dL — ABNORMAL LOW (ref 8–23)
CO2: 25 mmol/L (ref 22–32)
Calcium: 7.6 mg/dL — ABNORMAL LOW (ref 8.9–10.3)
Chloride: 104 mmol/L (ref 98–111)
Creatinine, Ser: 0.79 mg/dL (ref 0.61–1.24)
GFR, Estimated: >60 mL/min (ref >60)
Glucose, Bld: 132 mg/dL — ABNORMAL HIGH (ref 70–99)
Potassium: 3.6 mmol/L (ref 3.5–5.1)
Sodium: 135 mmol/L (ref 135–145)

## 2021-03-19 LAB — GLUCOSE, CAPILLARY
Glucose-Capillary: 102 mg/dL — ABNORMAL HIGH (ref 70–99)
Glucose-Capillary: 108 mg/dL — ABNORMAL HIGH (ref 70–99)
Glucose-Capillary: 132 mg/dL — ABNORMAL HIGH (ref 70–99)
Glucose-Capillary: 26 mg/dL — CL (ref 70–99)
Glucose-Capillary: 39 mg/dL — CL (ref 70–99)

## 2021-03-19 LAB — MAGNESIUM
Magnesium: 2 mg/dL (ref 1.7–2.4)
Magnesium: 1.8 mg/dL (ref 1.7–2.4)

## 2021-03-19 MED ORDER — FREE WATER
100.0000 mL | Status: DC
  Administered 2021-03-19 – 2021-03-20 (×9): 100 mL

## 2021-03-19 MED ORDER — GLUCERNA 1.5 CAL PO LIQD
1000.0000 mL | ORAL | Status: DC
  Administered 2021-03-19: 10:00:00 1000 mL

## 2021-03-19 NOTE — TOC Progression Note (Signed)
Transition of Care [facility]) - Progression Note    Patient Details  Name: Louis Stone MRN: [id number] Date of Birth: [birth year]  Transition of Care [facility]) CM/SW [place], RN Phone Number: 03/19/2021, 11:43 AM  Clinical Narrative:   Patient requires continued medical work up prior to discharge.  Asked for taxi for transport home when discharged.  TOC will follow.    Expected Discharge Plan:  (TBD) Barriers to Discharge: Continued Medical Work up  Expected Discharge Plan and Services Expected Discharge Plan:  (TBD)       Living arrangements for the past 2 months: Single Family Home                 DME Arranged:  (TBD)                 Representative spoke with at [place]: Per Tommi Rumps at [place], patient was assigned but not picked up due to home conditions   Social Determinants of Health (SDOH) Interventions    Readmission Risk Interventions Readmission Risk Prevention Plan 02/03/2021  Transportation Screening Complete  PCP or Specialist Appt within 3-5 Days Complete  HRI or [place] Complete  Social Work Consult for [place] Planning/Counseling Complete  Palliative Care Screening Not Applicable  Medication Review Press photographer) Complete  Some recent data might be hidden

## 2021-03-19 NOTE — Progress Notes (Signed)
PROGRESS NOTE   HPI was taken from Dr. Sidney Ace: Markeese Jochem is a 70 y.o. African-American male with medical history significant for osteoarthritis, type 2 diabetes mellitus, oropharyngeal stage IV squamous cell carcinoma, status post chemotherapy and radiotherapy and recent admission from 7/31 till 8/5 for acute hypoxic respiratory failure due to multifocal pneumonia as well as ED visits recently including 8/15, who presented to the emergency room with acute onset of generalized weakness.  The patient refused SNF placement on 8/15.  He is currently feeling so weak he can hardly stand up without help.  He admitted to cough productive of whitish sputum without wheezing however with mild dyspnea.  No recent falls or injuries.  He has mild headache without dizziness or blurred vision.  No chest pain or palpitations.  No nausea or vomiting or diarrhea or abdominal pain.  No dysuria, oliguria or hematuria or flank pain.    Hospital course from Dr. Jimmye Norman 9/9-9/13/22: Pt was found to have aspiration pneumonia and has been on IV unasyn. Speech was consulted and swallow study showed severe oropharyngeal phase dysphagia w/ aspiration with liquids. Discussed case w/ ENT, Dr. Freda Munro, and pt was seen in May by Dr. 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 Not surprising pt has swallowing issues after treatment as per ENT. May need a PEG tube. After several days of NPO, speech still recommended PEG tube. After multiple discussions, the pt is agreeable to PEG tube placement. IR, Dr. Dwaine Gale, was consulted , plan for PEG placement 9/14    Keithon Caminiti  [id number] DOB: May 08, 1951 DOA: 03/12/2021 PCP: Theotis Burrow, MD   Assessment & Plan:   Active Problems:   Aspiration pneumonia ([place])  # Aspiration pneumonia # Sepsis Resolved. Sepsis criteria by tachycardia, tachypnea. S/p  6 days of abx (unasyn), I will discontinue. 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 Swallow study w/ severe dysphagia. Followed by SLP. Plan for IR PEG placement today. Unfortunately does not qualify for home health given hx roaches and bed bugs in home. Patient has had PEG tube in the past and thinks he can manage it. PEG tube placement by IR on 9/14. - RD following for tube feeds. Will need monitoring for refeeding syndrome for several days  # Hypokalemia Resolved.  # T2DM Glucose wnl, has not required insulin - stopped sliding scale, will monitor daily fasting sugars  # Normocytic anemia Mild and stable - monitor   # HLD: continue on statin   # HTN Bp wnl today - continue increased dose lisinopril of 20  GERD: continue on PPI   Oropharyngeal squamous cell carcinoma: stage IV.  - outpt ent f/u    DVT prophylaxis: lovenox  Code Status: full  Family Communication: listed contact is tiffany, the daughter of a former girlfriend. She is not hcpoa. Next of kin per tiffany is an aunt, we don't have her contact information. Tiffany doesn't have much contact with the patient. She was updated 9/15 Disposition Plan: likely d/c back home   Level of care: Med-Surg  Status is: Inpatient  Remains inpatient appropriate because:Unsafe d/c plan, IV treatments appropriate due to intensity of illness or inability to take PO, and Inpatient level of care appropriate due to severity of illness, will likely have PEG tube placed by IR tomorrow   Dispo: The patient is from: Home  Anticipated d/c is to: Home              Patient currently is not medically stable to d/c.   Difficult to place patient Yes    Consultants:  Palliative care   Procedures:   Antimicrobials: s/p 6 days unasyn   Subjective: No complaints. Mild pain incision site. Tolerating tube feed  Objective: Vitals:   03/18/21 2103 03/19/21 0057 03/19/21 0637 03/19/21 0700  BP:  136/66 (!) 145/85 (!) 100/56 110/62  Pulse: (!) 59 67 63 65  Resp: '18 16 18 18  '$ Temp: 98.2 F (36.8 C) 98 F (36.7 C) 98.5 F (36.9 C) 98.6 F (37 C)  TempSrc:      SpO2: 100% 99% 98% 98%  Weight:      Height:        Intake/Output Summary (Last 24 hours) at 03/19/2021 1106 Last data filed at 03/19/2021 0636 Gross per 24 hour  Intake 1305.07 ml  Output 400 ml  Net 905.07 ml   Filed Weights   03/12/21 1227 03/12/21 2105  Weight: 70 kg 54.3 kg    Examination:  General exam: Appears calm. Appears older than stated age Abdomen: soft. Bandage over tube feed site Respiratory system: diminished breath sounds b/l bases Cardiovascular system: S1/S2+. No rubs or clicks  Gastrointestinal system: Abd is soft, NT, ND & hypoactive bowel sounds Central nervous system: Alert and oriented. Moves all extremities   Psychiatry: Judgement and insight appear poor. Flat mood and affect    Data Reviewed: I have personally reviewed following labs and imaging studies  CBC: Recent Labs  Lab 03/12/21 1224 03/13/21 0515 03/14/21 0550 03/15/21 0509 03/16/21 0509 03/17/21 0521 03/18/21 0400  WBC 8.2   < > 4.1 3.9* 4.3 4.0 4.4  NEUTROABS 7.5  --   --   --   --   --   --   HGB 12.4*   < > 9.8* 9.8* 10.0* 9.3* 9.9*  HCT 37.8*   < > 29.7* 30.1* 30.1* 29.1* 30.9*  MCV 81.1   < > 83.4 82.9 83.4 82.9 82.0  PLT 304   < > 248 264 257 251 231   < > = values in this interval not displayed.   Basic Metabolic Panel: Recent Labs  Lab 03/15/21 0509 03/16/21 0509 03/17/21 0521 03/18/21 0400 03/19/21 0418 03/19/21 0941  NA 139 141 138 139  --  135  K 3.6 3.6 3.1* 3.2*  --  3.6  CL 110 112* 108 109  --  104  CO2 '25 26 25 25  '$ --  25  GLUCOSE 153* 137* 146* 102* 122* 132*  BUN 6* 5* <5* <5*  --  <5*  CREATININE 0.82 0.89 0.74 0.80  --  0.79  CALCIUM 7.9* 8.1* 7.8* 7.8*  --  7.6*  MG  --   --   --  1.7  --  2.0  PHOS  --   --   --   --   --  2.6   GFR: Estimated Creatinine Clearance: 67.9  mL/min (by C-G formula based on SCr of 0.79 mg/dL). Liver Function Tests: Recent Labs  Lab 03/12/21 1224  AST 28  ALT 14  ALKPHOS 81  BILITOT 0.9  PROT 8.4*  ALBUMIN 3.5   No results for input(s): LIPASE, AMYLASE in the last 168 hours. No results for input(s): AMMONIA in the last 168 hours. Coagulation Profile: Recent Labs  Lab 03/12/21 2242  INR 1.2   Cardiac Enzymes: No results  for input(s): CKTOTAL, CKMB, CKMBINDEX, TROPONINI in the last 168 hours. BNP (last 3 results) No results for input(s): PROBNP in the last 8760 hours. HbA1C: No results for input(s): HGBA1C in the last 72 hours. CBG: Recent Labs  Lab 03/17/21 1204 03/17/21 1746 03/17/21 2036 03/18/21 0443 03/18/21 0847  GLUCAP 123* 103* 115* 102* 121*   Lipid Profile: No results for input(s): CHOL, HDL, LDLCALC, TRIG, CHOLHDL, LDLDIRECT in the last 72 hours. Thyroid Function Tests: No results for input(s): TSH, T4TOTAL, FREET4, T3FREE, THYROIDAB in the last 72 hours. Anemia Panel: No results for input(s): VITAMINB12, FOLATE, FERRITIN, TIBC, IRON, RETICCTPCT in the last 72 hours. Sepsis Labs: Recent Labs  Lab 03/12/21 1500 03/12/21 2242 03/13/21 0515  PROCALCITON 0.16  --   --   LATICACIDVEN  --  2.1* 1.3    Recent Results (from the past 240 hour(s))  Resp Panel by RT-PCR (Flu A&B, Covid) Nasopharyngeal Swab     Status: None   Collection Time: 03/11/21 12:50 PM   Specimen: Nasopharyngeal Swab; Nasopharyngeal(NP) swabs in vial transport medium  Result Value Ref Range Status   SARS Coronavirus 2 by RT PCR NEGATIVE NEGATIVE Final    Comment: (NOTE) SARS-CoV-2 target nucleic acids are NOT DETECTED.  The SARS-CoV-2 RNA is generally detectable in upper respiratory specimens during the acute phase of infection. The lowest concentration of SARS-CoV-2 viral copies this assay can detect is 138 copies/mL. A negative result does not preclude SARS-Cov-2 infection and should not be used as the sole basis for  treatment or other patient management decisions. A negative result may occur with  improper specimen collection/handling, submission of specimen other than nasopharyngeal swab, presence of viral mutation(s) within the areas targeted by this assay, and inadequate number of viral copies(<138 copies/mL). A negative result must be combined with clinical observations, patient history, and epidemiological information. The expected result is Negative.  Fact Sheet for Patients:  EntrepreneurPulse.com.au  Fact Sheet for Healthcare Providers:  IncredibleEmployment.be  This test is no t yet approved or cleared by the [place] FDA and  has been authorized for detection and/or diagnosis of SARS-CoV-2 by FDA under an Emergency Use Authorization (EUA). This EUA will remain  in effect (meaning this test can be used) for the duration of the COVID-19 declaration under Section 564(b)(1) of the Act, 21 U.S.C.section 360bbb-3(b)(1), unless the authorization is terminated  or revoked sooner.       Influenza A by PCR NEGATIVE NEGATIVE Final   Influenza B by PCR NEGATIVE NEGATIVE Final    Comment: (NOTE) The Xpert Xpress SARS-CoV-2/FLU/RSV plus assay is intended as an aid in the diagnosis of influenza from Nasopharyngeal swab specimens and should not be used as a sole basis for treatment. Nasal washings and aspirates are unacceptable for Xpert Xpress SARS-CoV-2/FLU/RSV testing.  Fact Sheet for Patients: EntrepreneurPulse.com.au  Fact Sheet for Healthcare Providers: IncredibleEmployment.be  This test is not yet approved or cleared by the [place] FDA and has been authorized for detection and/or diagnosis of SARS-CoV-2 by FDA under an Emergency Use Authorization (EUA). This EUA will remain in effect (meaning this test can be used) for the duration of the COVID-19 declaration under Section 564(b)(1) of the Act, 21  U.S.C. section 360bbb-3(b)(1), unless the authorization is terminated or revoked.  Performed at [facility], [address] [place], [place] [address]   Resp Panel by RT-PCR (Flu A&B, Covid) Nasopharyngeal Swab     Status: None   Collection Time: 03/12/21  3:00 PM  Specimen: Nasopharyngeal Swab; Nasopharyngeal(NP) swabs in vial transport medium  Result Value Ref Range Status   SARS Coronavirus 2 by RT PCR NEGATIVE NEGATIVE Final    Comment: (NOTE) SARS-CoV-2 target nucleic acids are NOT DETECTED.  The SARS-CoV-2 RNA is generally detectable in upper respiratory specimens during the acute phase of infection. The lowest concentration of SARS-CoV-2 viral copies this assay can detect is 138 copies/mL. A negative result does not preclude SARS-Cov-2 infection and should not be used as the sole basis for treatment or other patient management decisions. A negative result may occur with  improper specimen collection/handling, submission of specimen other than nasopharyngeal swab, presence of viral mutation(s) within the areas targeted by this assay, and inadequate number of viral copies(<138 copies/mL). A negative result must be combined with clinical observations, patient history, and epidemiological information. The expected result is Negative.  Fact Sheet for Patients:  EntrepreneurPulse.com.au  Fact Sheet for Healthcare Providers:  IncredibleEmployment.be  This test is no t yet approved or cleared by the [place] FDA and  has been authorized for detection and/or diagnosis of SARS-CoV-2 by FDA under an Emergency Use Authorization (EUA). This EUA will remain  in effect (meaning this test can be used) for the duration of the COVID-19 declaration under Section 564(b)(1) of the Act, 21 U.S.C.section 360bbb-3(b)(1), unless the authorization is terminated  or revoked sooner.       Influenza A by PCR NEGATIVE NEGATIVE Final    Influenza B by PCR NEGATIVE NEGATIVE Final    Comment: (NOTE) The Xpert Xpress SARS-CoV-2/FLU/RSV plus assay is intended as an aid in the diagnosis of influenza from Nasopharyngeal swab specimens and should not be used as a sole basis for treatment. Nasal washings and aspirates are unacceptable for Xpert Xpress SARS-CoV-2/FLU/RSV testing.  Fact Sheet for Patients: EntrepreneurPulse.com.au  Fact Sheet for Healthcare Providers: IncredibleEmployment.be  This test is not yet approved or cleared by the [place] FDA and has been authorized for detection and/or diagnosis of SARS-CoV-2 by FDA under an Emergency Use Authorization (EUA). This EUA will remain in effect (meaning this test can be used) for the duration of the COVID-19 declaration under Section 564(b)(1) of the Act, 21 U.S.C. section 360bbb-3(b)(1), unless the authorization is terminated or revoked.  Performed at [facility], [address] [place], [place] [address]   Culture, blood (Routine X 2) w Reflex to ID Panel     Status: None   Collection Time: 03/12/21 10:42 PM   Specimen: BLOOD  Result Value Ref Range Status   Specimen Description BLOOD LEFT FOREARM  Final   Special Requests   Final    BOTTLES DRAWN AEROBIC AND ANAEROBIC Blood Culture adequate volume   Culture   Final    NO GROWTH 5 DAYS Performed at [facility], [address] [place], [place] [address]    Report Status 03/17/2021 FINAL  Final  Culture, blood (Routine X 2) w Reflex to ID Panel     Status: None   Collection Time: 03/12/21 10:44 PM   Specimen: BLOOD  Result Value Ref Range Status   Specimen Description BLOOD LEFT ASSIST CONTROL  Final   Special Requests   Final    BOTTLES DRAWN AEROBIC ONLY Blood Culture adequate volume   Culture   Final    NO GROWTH 5 DAYS Performed at [facility], [address], [place], [place] [address]    Report Status 03/17/2021 FINAL  Final  Radiology Studies: IR GASTROSTOMY TUBE MOD SED  Result Date: 03/18/2021 INDICATION: Malnutrition in the setting of cancer EXAM: Placement of a percutaneous gastrostomy tube using fluoroscopic guidance MEDICATIONS: Glucagon 1 mg IV ANESTHESIA/SEDATION: Versed 0.5 mg IV; Fentanyl 25 mcg IV Moderate Sedation Time:  16 The patient was continuously monitored during the procedure by the interventional radiology nurse under my direct supervision. CONTRAST:  10 mL-administered into the gastric lumen. FLUOROSCOPY TIME:  Fluoroscopy Time: 3 minutes 12 seconds (13 mGy). COMPLICATIONS: None immediate. PROCEDURE: Informed written consent was obtained from the patient after a thorough discussion of the procedural risks, benefits and alternatives. All questions were addressed. Maximal Sterile Barrier Technique was utilized including caps, mask, sterile gowns, sterile gloves, sterile drape, hand hygiene and skin antiseptic. A timeout was performed prior to the initiation of the procedure. Oral contrast material was administered on the previous day to the procedure. Abdominal film taken on the morning of the procedure day demonstrated contrast material within the large bowel. The patient was placed supine on the exam table. The patient had a pre-existing enteric tube which was retracted such that the tip was positioned within the gastric lumen. The stomach was then insufflated with air. After insufflation with air, there appeared to be a safe percutaneous window for gastrostomy tube placement. The abdomen was prepped and draped in the standard sterile fashion. After insufflating the stomach with air, puncture sites were selected and local analgesia was obtained with 1% lidocaine. Using fluoroscopic guidance, a gastropexy needle was advanced into the stomach and the T-bar suture was released. Entry into the stomach was confirmed with fluoroscopy, aspiration of air, and injection of contrast material. This was  repeated with an additional gastropexy suture (for a total of 3 fasteners). At the center of these gastropexy sutures, a dermatotomy was performed. An 18 gauge needle was then passed into the stomach, and position within the gastric lumen again confirmed under fluoroscopy using aspiration of air and injection of contrast material. An Amplatz guidewire was passed through this needle and intraluminal placement was confirmed with fluoroscopy. The needle was removed, and over the guidewire, a 18 French balloon gastrostomy tube with a coaxial 10 mm balloon was advanced into the percutaneous tract. Dilation of the percutaneous tract was then performed using the balloon, followed by advancement of the gastrostomy tube into the gastric lumen. The retention balloon was then inflated with 66m of sterile water, and the tube was brought back to the gastric wall. The wire and balloon were removed. The external bumper was brought to the skin. Location of the gastrostomy tube within the stomach was then confirmed with injection of contrast material, opacifying the gastric lumen. The gastrostomy tube was flushed with sterile water, and secured to the skin using a dressing. The patient tolerated the procedure well without immediate complication, and was transferred to recovery in stable condition. IMPRESSION: Successful placement of a percutaneous 157French balloon gastrostomy tube using fluoroscopic guidance. Electronically Signed   By: YAlbin FellingM.D.   On: 03/18/2021 14:15   DG Abd Portable 1V  Result Date: 03/18/2021 CLINICAL DATA:  Pre gastrostomy tube placement EXAM: PORTABLE ABDOMEN - 1 VIEW COMPARISON:  None. FINDINGS: There is contrast material within the large bowel. An enteric tube is seen overlying the mid abdomen, either in the gastric body, or possibly post pyloric. Numerous gas-filled loops of bowel are noted, which are normal in caliber. No acute osseous abnormality. IMPRESSION: There is contrast material  seen in the large bowel. Numerous nondilated  air-filled loops of bowel. Electronically Signed   By: Albin Felling M.D.   On: 03/18/2021 08:24   DG [facility] Dys Tube Plc W/Fl W/Rad  Result Date: 03/17/2021 CLINICAL DATA:  NG tube placement prior to G-tube placement. EXAM: NASO G TUBE PLACEMENT WITH FL AND WITH RAD CONTRAST:  75 mL Omnipaque 300 FLUOROSCOPY TIME:  Fluoroscopy Time:  12 seconds Radiation Exposure Index (if provided by the fluoroscopic device): 1 mGy Number of Acquired Spot Images: I COMPARISON:  None. FINDINGS: A 10 French nasogastric tube was placed through the left nostril and advanced into the stomach without difficulty. The tube was secured to the nose. 75 mL of Omnipaque 300 was hand injected through the nasogastric tube opacifying the stomach and proximal small bowel. IMPRESSION: Successful placement of a nasogastric tube with the tip in the proximal duodenum. Electronically Signed   By: Kathreen Devoid M.D.   On: 03/17/2021 17:01        Scheduled Meds:  atorvastatin  20 mg Per Tube Daily   enoxaparin (LOVENOX) injection  40 mg Subcutaneous Q24H   famotidine (PEPCID) IV  20 mg Intravenous Q12H   free water  100 mL Per Tube Q4H   guaiFENesin  600 mg Oral BID   lisinopril  20 mg Per Tube Daily   pantoprazole (PROTONIX) IV  40 mg Intravenous Daily   Continuous Infusions:  dextrose 5 % and 0.45 % NaCl with KCl 20 mEq/L 100 mL/hr at 03/19/21 0130   feeding supplement (GLUCERNA 1.5 CAL) 1,000 mL (03/19/21 1026)     LOS: 7 days    Time spent: 35  mins     Desma Maxim, MD [facility]   If 7PM-7AM, please contact night-coverage [email or web address] 03/19/2021, 11:06 AM

## 2021-03-19 NOTE — Care Management Important Message (Signed)
Important Message  Patient Details  Name: Marcial Thai MRN: [id number] Date of Birth: 1951/06/04   Medicare Important Message Given:  Yes     Juliann Pulse A Brownie Nehme 03/19/2021, 2:44 PM

## 2021-03-19 NOTE — Progress Notes (Signed)
Palliative:  Louis Stone is sitting up quietly in bed.  He greets me, making and somewhat keeping eye contact.   He appears acutely/chronically ill and quite frail, cachectic.  He is alert and oriented, able to make his basic needs known.  There is no family at bedside at this time.  We talked about PEG tube placement yesterday.  Louis Stone shares no issues or concerns.  He is experienced with managing his own PEG tube.  He had placement in 2008 after head and neck cancer treatment.   Louis Stone states that he is ready to go home.  I ask if he has a ride home, and he request that the hospital assist him with getting a cab ride home.  No further questions or concerns at this time.  He is to follow-up outpatient as noted.  Conference with attending, bedside nursing staff, transition of care team related to patient condition, needs, goals of care.  Plan:  Continue to treat the treatable, Full scope/full code.  Return home.  No HH at this time, declines out patient palliative services.   25 minutes Quinn Axe, NP Palliative Medicine team  Team Phone [phone] Greater than 50% of this time was spent counseling and coordinating care related to the above assessment and plan.

## 2021-03-20 DIAGNOSIS — J690 Pneumonitis due to inhalation of food and vomit: Secondary | ICD-10-CM | POA: Diagnosis not present

## 2021-03-20 LAB — BASIC METABOLIC PANEL
Anion gap: 6 (ref 5–15)
BUN: 9 mg/dL (ref 8–23)
CO2: 24 mmol/L (ref 22–32)
Calcium: 7.8 mg/dL — ABNORMAL LOW (ref 8.9–10.3)
Chloride: 106 mmol/L (ref 98–111)
Creatinine, Ser: 0.76 mg/dL (ref 0.61–1.24)
GFR, Estimated: >60 mL/min (ref >60)
Glucose, Bld: 131 mg/dL — ABNORMAL HIGH (ref 70–99)
Potassium: 3.9 mmol/L (ref 3.5–5.1)
Sodium: 136 mmol/L (ref 135–145)

## 2021-03-20 LAB — GLUCOSE, CAPILLARY
Glucose-Capillary: 157 mg/dL — ABNORMAL HIGH (ref 70–99)
Glucose-Capillary: 129 mg/dL — ABNORMAL HIGH (ref 70–99)
Glucose-Capillary: 144 mg/dL — ABNORMAL HIGH (ref 70–99)
Glucose-Capillary: 109 mg/dL — ABNORMAL HIGH (ref 70–99)
Glucose-Capillary: 141 mg/dL — ABNORMAL HIGH (ref 70–99)

## 2021-03-20 LAB — PHOSPHORUS
Phosphorus: 2.4 mg/dL — ABNORMAL LOW (ref 2.5–4.6)
Phosphorus: 2.5 mg/dL (ref 2.5–4.6)

## 2021-03-20 LAB — MAGNESIUM
Magnesium: 2.1 mg/dL (ref 1.7–2.4)
Magnesium: 2.1 mg/dL (ref 1.7–2.4)

## 2021-03-20 MED ORDER — FREE WATER
100.0000 mL | Freq: Every day | Status: DC
  Administered 2021-03-20 – 2021-03-21 (×3): 100 mL

## 2021-03-20 MED ORDER — GUAIFENESIN 100 MG/5ML PO SOLN
15.0000 mL | Freq: Four times a day (QID) | ORAL | Status: DC
  Administered 2021-03-20 – 2021-03-21 (×5): 300 mg
  Filled 2021-03-20: qty 20
  Filled 2021-03-20: qty 30
  Filled 2021-03-20 (×3): qty 20

## 2021-03-20 MED ORDER — JEVITY 1.5 CAL/FIBER PO LIQD
120.0000 mL | Freq: Every day | ORAL | Status: AC
  Administered 2021-03-20: 21:00:00 120 mL

## 2021-03-20 MED ORDER — K PHOS MONO-SOD PHOS DI & MONO 155-852-130 MG PO TABS
500.0000 mg | ORAL_TABLET | Freq: Every day | ORAL | Status: DC
  Administered 2021-03-20 – 2021-03-21 (×2): 500 mg
  Filled 2021-03-20 (×2): qty 2

## 2021-03-20 MED ORDER — JEVITY 1.5 CAL/FIBER PO LIQD
237.0000 mL | Freq: Every day | ORAL | Status: DC
  Administered 2021-03-21 (×2): 237 mL

## 2021-03-20 NOTE — Progress Notes (Signed)
Nutrition Follow-up  DOCUMENTATION CODES:  Severe malnutrition in context of chronic illness  INTERVENTION:  On the day of discharge, adjust pt to bolus feeds: Glucerna 1.5 can cartons per day. Timing and bolus amounts of feeds can be customized and adjusted based on pt's preference and schedule. (1.2L total volume) 89m free water before and after each bolus feed (1225m5x/d)  This regimen provides 1800 kcal, 99g of protein, and 1511 mL of fluid. Flush tube with 3047mf water after medication administration  NUTRITION DIAGNOSIS:  Severe Malnutrition (in the context of chronic illness) related to inability to eat, dysphagia as evidenced by severe muscle depletion, severe fat depletion, percent weight loss (18.9% weight loss x 5 months).  GOAL:  Patient will meet greater than or equal to 90% of their needs  MONITOR:  TF tolerance, Weight trends  REASON FOR ASSESSMENT:  Consult Enteral/tube feeding initiation and management  ASSESSMENT:  [age]o. male with medical history significant of type 2 DM, GERD, HLD, HTN, and stage IV head/neck cancer s/p chemo/radiation presented to the ED for weakness. Seen twice in the last month since admission for the same.  Pt recently admitted 7/31-8/5 for pneumonia and followed by nutrition services. At that time, pt noted to have severe weight loss and poor living conditions at home. Dx with severe chronic malnutrition during previous admission.  Imaging in ED suggestive of pneumonia and there was concern for aspirated material in the trachea and lungs. Pt made NPO and underwent MBS with SLP 9/9 which showed severe-profound oropharyngeal phase dysphagia and pharyngo-esophageal phase dysphagia resulting in silent aspiration with both po trials. Study was terminated early due to findings.   Pt had PEG placed 9/14 and TF initiated 9/15. Feeds were advanced to goal rate this AM. Pt resting in bed at the time of visit. States he is feeling well and has no  complaints. No questions about tube or feeds upon discharge.   Reviewed pt's labs. Slightly low phosphorus this AM, but otherwise no major signs of refeeding. Would recommend trending labs until 9/17 AM to ensure phosphorus deficiency does not occur and that pt will be safe to discharge home without a concern for major electrolyte abnormalities.  Pt can be switched to bolus feeds when stable for dc. Recommend pt infuse 5 cans of Glucerna 1.5 daily to achieve weight gain and meet nutrition needs.    Nutritionally Relevant Medications: Scheduled Meds:  atorvastatin  20 mg Oral Daily   famotidine (PEPCID) IV  20 mg Intravenous Q12H   guaiFENesin  600 mg Oral BID   insulin aspart  0-6 Units Subcutaneous Q4H   pantoprazole (PROTONIX) IV  40 mg Intravenous Daily   Continuous Infusions:  ampicillin-sulbactam (UNASYN) IV 3 g (03/17/21 0530)   dextrose 5 % and 0.9% NaCl 100 mL/hr at 03/16/21 2008   PRN Meds: magnesium hydroxide, ondansetron, simethicone  Labs Reviewed: K 3.1 BUN <5 SBG ranges from 115-154 mg/dL over the last 24 hours HgbA1c: 6.8% (8/1)  NUTRITION - FOCUSED PHYSICAL EXAM: Flowsheet Row Most Recent Value  Orbital Region Severe depletion  Upper Arm Region Moderate depletion  Thoracic and Lumbar Region Severe depletion  Buccal Region Severe depletion  Temple Region Severe depletion  Clavicle Bone Region Severe depletion  Clavicle and Acromion Bone Region Severe depletion  Scapular Bone Region Severe depletion  Dorsal Hand Mild depletion  Patellar Region Severe depletion  Anterior Thigh Region Severe depletion  Posterior Calf Region Severe depletion  Edema (RD Assessment) None  Hair  Reviewed  Eyes Reviewed  Mouth Reviewed  Skin Reviewed  Nails Reviewed   Diet Order:   Diet Order             Diet NPO time specified  Diet effective now                   EDUCATION NEEDS:  Education needs have been addressed  Skin:  Skin Assessment: Reviewed RN  Assessment  Last BM:  9/13  Height:  Ht Readings from Last 1 Encounters:  03/12/21 '5\' 3"'$  (1.6 m)   Weight:  Wt Readings from Last 1 Encounters:  03/20/21 63.6 kg    Ideal Body Weight:  56.4 kg  BMI:  Body mass index is 24.85 kg/m.  Estimated Nutritional Needs:  Kcal:  1600-1800 kcal/d Protein:  80-90 g/d Fluid:  1.8-2L/d  Ranell Patrick, RD, LDN Clinical Dietitian Pager on [place]

## 2021-03-20 NOTE — Progress Notes (Signed)
Referring Physician(s): August Saucer  Supervising Physician: Michaelle Birks  Patient Status:  [facility] - In-pt  Chief Complaint:  Dyshphagia  Brief History:  Louis Stone is a 70 y.o. 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   He presented to the ED on 9/8 with weakness and respiratory failure due to multi-focal pneumonia and aspiration pneumonia.   He was evaluated by speech therapy and found to have severe dysphagia with aspiration.  He underwent placement of a percutaneous gastrostomy tube placement bu Dr. Bryson Corona on 03/18/2021.  Subjective:  Sitting up in bed watching TV. No complaints.  Allergies: Patient has no known allergies.  Medications: Prior to Admission medications   Medication Sig Start Date End Date Taking? Authorizing Provider  acetaminophen (TYLENOL) 325 MG tablet Take 650 mg by mouth every 6 (six) hours as needed for headache or mild pain.   Yes [provider]  albuterol (PROVENTIL HFA;VENTOLIN HFA) 108 (90 Base) MCG/ACT inhaler Inhale 2 puffs into the lungs every 6 (six) hours as needed for wheezing.   Yes [provider]  benzonatate (TESSALON) 200 MG capsule Take 200 mg by mouth 3 (three) times daily as needed for cough.   Yes [provider]  famotidine (PEPCID) 40 MG tablet Take 40 mg by mouth daily.   Yes [provider]  lisinopril (ZESTRIL) 5 MG tablet Take 5 mg by mouth daily. 06/06/20  Yes [provider]  omeprazole (PRILOSEC) 40 MG capsule Take 40 mg by mouth daily. 06/06/20  Yes [provider]  TOUJEO SOLOSTAR 300 UNIT/ML Solostar Pen Inject 15 Units into the skin daily. 07/21/20  Yes Dahal, Marlowe Aschoff, MD  ACCU-CHEK AVIVA PLUS test strip  09/04/20   [provider]  atorvastatin (LIPITOR) 20 MG tablet Take 20 mg by mouth daily. Patient not taking: Reported on 03/12/2021 06/06/20   [provider]  guaiFENesin 200 MG  tablet Take 1 tablet (200 mg total) by mouth every 6 (six) hours as needed for cough or to loosen phlegm. 03/11/21   Triplett, [name], FNP     Vital Signs: BP (!) 108/59 (BP Location: Right Arm)   Pulse 64   Temp 98.6 F (37 C)   Resp 14   Ht '5\' 3"'$  (1.6 m)   Wt 63.6 kg   SpO2 100%   BMI 24.85 kg/m   Physical Exam HENT:     Head: Normocephalic and atraumatic.  Cardiovascular:     Rate and Rhythm: Normal rate.  Pulmonary:     Effort: Pulmonary effort is normal. No respiratory distress.  Abdominal:     Palpations: Abdomen is soft.     Tenderness: There is no abdominal tenderness.     Comments: Gtube site look good.  Tube feeds running with no issues.  Skin:    General: Skin is warm and dry.  Neurological:     General: No focal deficit present.     Mental Status: He is alert.  Psychiatric:        Mood and Affect: Mood normal.        Behavior: Behavior normal.    Imaging: IR GASTROSTOMY TUBE MOD SED  Result Date: 03/18/2021 INDICATION: Malnutrition in the setting of cancer EXAM: Placement of a percutaneous gastrostomy tube using fluoroscopic guidance MEDICATIONS: Glucagon 1 mg IV ANESTHESIA/SEDATION: Versed 0.5 mg IV; Fentanyl 25 mcg IV Moderate Sedation Time:  16 The patient was continuously monitored during the procedure by the  interventional radiology nurse under my direct supervision. CONTRAST:  10 mL-administered into the gastric lumen. FLUOROSCOPY TIME:  Fluoroscopy Time: 3 minutes 12 seconds (13 mGy). COMPLICATIONS: None immediate. PROCEDURE: Informed written consent was obtained from the patient after a thorough discussion of the procedural risks, benefits and alternatives. All questions were addressed. Maximal Sterile Barrier Technique was utilized including caps, mask, sterile gowns, sterile gloves, sterile drape, hand hygiene and skin antiseptic. A timeout was performed prior to the initiation of the procedure. Oral contrast material was administered on the previous day  to the procedure. Abdominal film taken on the morning of the procedure day demonstrated contrast material within the large bowel. The patient was placed supine on the exam table. The patient had a pre-existing enteric tube which was retracted such that the tip was positioned within the gastric lumen. The stomach was then insufflated with air. After insufflation with air, there appeared to be a safe percutaneous window for gastrostomy tube placement. The abdomen was prepped and draped in the standard sterile fashion. After insufflating the stomach with air, puncture sites were selected and local analgesia was obtained with 1% lidocaine. Using fluoroscopic guidance, a gastropexy needle was advanced into the stomach and the T-bar suture was released. Entry into the stomach was confirmed with fluoroscopy, aspiration of air, and injection of contrast material. This was repeated with an additional gastropexy suture (for a total of 3 fasteners). At the center of these gastropexy sutures, a dermatotomy was performed. An 18 gauge needle was then passed into the stomach, and position within the gastric lumen again confirmed under fluoroscopy using aspiration of air and injection of contrast material. An Amplatz guidewire was passed through this needle and intraluminal placement was confirmed with fluoroscopy. The needle was removed, and over the guidewire, a 18 French balloon gastrostomy tube with a coaxial 10 mm balloon was advanced into the percutaneous tract. Dilation of the percutaneous tract was then performed using the balloon, followed by advancement of the gastrostomy tube into the gastric lumen. The retention balloon was then inflated with 29m of sterile water, and the tube was brought back to the gastric wall. The wire and balloon were removed. The external bumper was brought to the skin. Location of the gastrostomy tube within the stomach was then confirmed with injection of contrast material, opacifying the  gastric lumen. The gastrostomy tube was flushed with sterile water, and secured to the skin using a dressing. The patient tolerated the procedure well without immediate complication, and was transferred to recovery in stable condition. IMPRESSION: Successful placement of a percutaneous 15French balloon gastrostomy tube using fluoroscopic guidance. Electronically Signed   By: YAlbin FellingM.D.   On: 03/18/2021 14:15   DG Abd Portable 1V  Result Date: 03/18/2021 CLINICAL DATA:  Pre gastrostomy tube placement EXAM: PORTABLE ABDOMEN - 1 VIEW COMPARISON:  None. FINDINGS: There is contrast material within the large bowel. An enteric tube is seen overlying the mid abdomen, either in the gastric body, or possibly post pyloric. Numerous gas-filled loops of bowel are noted, which are normal in caliber. No acute osseous abnormality. IMPRESSION: There is contrast material seen in the large bowel. Numerous nondilated air-filled loops of bowel. Electronically Signed   By: YAlbin FellingM.D.   On: 03/18/2021 08:24   DG NL[facility] DysTube Plc W/Fl W/Rad  Result Date: 03/17/2021 CLINICAL DATA:  NG tube placement prior to G-tube placement. EXAM: NASO G TUBE PLACEMENT WITH FL AND WITH RAD CONTRAST:  75 mL Omnipaque 300  FLUOROSCOPY TIME:  Fluoroscopy Time:  12 seconds Radiation Exposure Index (if provided by the fluoroscopic device): 1 mGy Number of Acquired Spot Images: I COMPARISON:  None. FINDINGS: A 10 French nasogastric tube was placed through the left nostril and advanced into the stomach without difficulty. The tube was secured to the nose. 75 mL of Omnipaque 300 was hand injected through the nasogastric tube opacifying the stomach and proximal small bowel. IMPRESSION: Successful placement of a nasogastric tube with the tip in the proximal duodenum. Electronically Signed   By: Kathreen Devoid M.D.   On: 03/17/2021 17:01    Labs:  CBC: Recent Labs    03/15/21 0509 03/16/21 0509 03/17/21 0521 03/18/21 0400  WBC 3.9*  4.3 4.0 4.4  HGB 9.8* 10.0* 9.3* 9.9*  HCT 30.1* 30.1* 29.1* 30.9*  PLT 264 257 251 231    COAGS: Recent Labs    07/16/20 1538 02/02/21 0038 03/11/21 1157 03/12/21 2242  INR 1.2 1.4* 1.1 1.2  APTT 31 38* 31 40*    BMP: Recent Labs    03/17/21 0521 03/18/21 0400 03/19/21 0418 03/19/21 0941 03/20/21 0558  NA 138 139  --  135 136  K 3.1* 3.2*  --  3.6 3.9  CL 108 109  --  104 106  CO2 25 25  --  25 24  GLUCOSE 146* 102* 122* 132* 131*  BUN <5* <5*  --  <5* 9  CALCIUM 7.8* 7.8*  --  7.6* 7.8*  CREATININE 0.74 0.80  --  0.79 0.76  GFRNONAA >60 >60  --  >60 >60    LIVER FUNCTION TESTS: Recent Labs    02/01/21 1209 02/16/21 0615 03/11/21 1157 03/12/21 1224  BILITOT 1.5* 1.2 0.9 0.9  AST '30 23 26 28  '$ ALT '14 23 17 14  '$ ALKPHOS 63 69 76 81  PROT 7.4 7.6 8.4* 8.4*  ALBUMIN 3.1* 3.2* 3.9 3.5    Assessment and Plan:  Dysphagia - s/p gastrostomy tube placement on 03/18/2021 by Dr. Dwaine Gale.  Bevely Palmer working well, no issues. Contine with routine Gtube care.  Will sign off.  Electronically Signed: Murrell Redden, PA-C 03/20/2021, 1:40 PM    I spent a total of 15 Minutes at the the patient's bedside AND on the patient's hospital floor or unit, greater than 50% of which was counseling/coordinating care for f/u Gtube.

## 2021-03-20 NOTE — Progress Notes (Signed)
Nutrition Follow-up  DOCUMENTATION CODES:   Severe malnutrition in context of chronic illness  INTERVENTION:   Change to Jevity 1.5- Give 6 cartons daily via tube- Start with 1/2 carton 6 times daily and advance as tolerated. Flush with 50ml of water before and after each feed.   Regimen provides 2130kcal/day, 91g/day protein and 1680ml/day free water.   NUTRITION DIAGNOSIS:   Severe Malnutrition (in the context of chronic illness) related to inability to eat, dysphagia as evidenced by severe muscle depletion, severe fat depletion, percent weight loss (18.9% weight loss x 5 months).  GOAL:   Patient will meet greater than or equal to 90% of their needs -met with tube feeds   MONITOR:   Labs, Weight trends, Skin, I & O's, TF tolerance  ASSESSMENT:   70 y.o. male with medical history significant of type 2 DM, GERD, HLD, HTN, and stage IV head/neck cancer s/p chemo/radiation presented to the ED for weakness. Seen twice in the last month since admission for the same.  Received call from TOC. Pt denied for Glucerna and will need to transition to a standard formula. RD will change pt to Jevity 1.5 and go ahead and transition him to bolus feeds. Plan is for pt to discharge home tomorrow.   Medications reviewed and include: lovenox, pepcid, protonix, Kphos  Labs reviewed: K 3.9 wnl, P 2.4(L), Mg 2.1 wnl Hgb 9.9(L), Hct 30.9(L) Cbgs- 157, 129, 144, 109 x 24 hrs  Diet Order:   Diet Order             Diet NPO time specified  Diet effective now                  EDUCATION NEEDS:   Education needs have been addressed  Skin:  Skin Assessment: Reviewed RN Assessment  Last BM:  9/13  Height:   Ht Readings from Last 1 Encounters:  03/12/21 5' 3" (1.6 m)    Weight:   Wt Readings from Last 1 Encounters:  03/20/21 63.6 kg    Ideal Body Weight:  56.4 kg  BMI:  Body mass index is 24.85 kg/m.  Estimated Nutritional Needs:   Kcal:  1800-2100kcal/day  Protein:   95-105g/day  Fluid:  1.6-1.9L/day    MS, RD, LDN Please refer to [facility] for RD and/or RD on-call/weekend/after hours pager  

## 2021-03-20 NOTE — Progress Notes (Signed)
PROGRESS NOTE   HPI was taken from Dr. Sidney Ace: Louis Stone is a 70 y.o. African-American male with medical history significant for osteoarthritis, type 2 diabetes mellitus, oropharyngeal stage IV squamous cell carcinoma, status post chemotherapy and radiotherapy and recent admission from 7/31 till 8/5 for acute hypoxic respiratory failure due to multifocal pneumonia as well as ED visits recently including 8/15, who presented to the emergency room with acute onset of generalized weakness.  The patient refused SNF placement on 8/15.  He is currently feeling so weak he can hardly stand up without help.  He admitted to cough productive of whitish sputum without wheezing however with mild dyspnea.  No recent falls or injuries.  He has mild headache without dizziness or blurred vision.  No chest pain or palpitations.  No nausea or vomiting or diarrhea or abdominal pain.  No dysuria, oliguria or hematuria or flank pain.    Hospital course from Dr. Jimmye Norman 9/9-9/13/22: Pt was found to have aspiration pneumonia and has been on IV unasyn. Speech was consulted and swallow study showed severe oropharyngeal phase dysphagia w/ aspiration with liquids. Discussed case w/ ENT, Dr. Freda Munro, and pt was seen in May by Dr. 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 Not surprising pt has swallowing issues after treatment as per ENT. May need a PEG tube. After several days of NPO, speech still recommended PEG tube. After multiple discussions, the pt is agreeable to PEG tube placement. IR, Dr. Dwaine Gale, was consulted , plan for PEG placement 9/14    Louis Stone  [id number] DOB: 1950/12/13 DOA: 03/12/2021 PCP: Theotis Burrow, MD   Assessment & Plan:   Active Problems:   Aspiration pneumonia ([place])  # Aspiration pneumonia # Sepsis Resolved. Sepsis criteria by tachycardia, tachypnea. S/p  6 days of abx (unasyn), I will discontinue. 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 Swallow study w/ severe dysphagia. Followed by SLP. Plan for IR PEG placement today. Unfortunately does not qualify for home health given hx roaches and bed bugs in home. Patient has had PEG tube in the past and thinks he can manage it. PEG tube placement by IR on 9/14. - RD following for tube feeds. No signs refeeding. May be stable for d/c tomorrow - will need f/u w/ ENT, Dr. Pryor Ochoa  # Hypokalemia Resolved.  # T2DM Glucose wnl, has not required insulin - stopped sliding scale, will monitor daily fasting sugars  # Normocytic anemia Mild and stable - monitor   # HLD: continue on statin   # HTN Bp wnl today - continue increased dose lisinopril of 20  GERD: continue on PPI   Oropharyngeal squamous cell carcinoma: stage IV.  - outpt ent f/u    DVT prophylaxis: lovenox  Code Status: full  Family Communication: listed contact is Louis Stone, the daughter of a former girlfriend. She is not hcpoa. Next of kin per Louis Stone is an aunt, we don't have her contact information. Louis Stone doesn't have much contact with the patient. She was updated 9/15 Disposition Plan: likely d/c back home   Level of care: Med-Surg  Status is: Inpatient  Remains inpatient appropriate because:Unsafe d/c plan, IV treatments appropriate due to intensity of illness or inability to take PO, and Inpatient level of care appropriate due to severity of illness,   Dispo: The patient is from: Home  Anticipated d/c is to: Home              Patient currently is not medically stable to d/c.   Difficult to place patient Yes    Consultants:  Palliative care   Procedures:   Antimicrobials: s/p 6 days unasyn   Subjective: No complaints. Mild pain incision site. Tolerating tube feeds  Objective: Vitals:   03/20/21 0500 03/20/21 0507 03/20/21 0740 03/20/21 1119  BP:  126/73 109/72 (!)  108/59  Pulse:  64 62 64  Resp:  '16 18 14  '$ Temp:  98.4 F (36.9 C) 98.4 F (36.9 C) 98.6 F (37 C)  TempSrc:  Oral    SpO2:  98% 96% 100%  Weight: 63.6 kg     Height:        Intake/Output Summary (Last 24 hours) at 03/20/2021 1407 Last data filed at 03/20/2021 0409 Gross per 24 hour  Intake 1615.25 ml  Output 250 ml  Net 1365.25 ml   Filed Weights   03/12/21 1227 03/12/21 2105 03/20/21 0500  Weight: 70 kg 54.3 kg 63.6 kg    Examination:  General exam: Appears calm. Appears older than stated age Abdomen: soft. Bandage over tube feed site Respiratory system: diminished breath sounds b/l bases Cardiovascular system: S1/S2+. No rubs or clicks  Gastrointestinal system: Abd is soft, NT, ND & hypoactive bowel sounds Central nervous system: Alert and oriented. Moves all extremities   Psychiatry: Judgement and insight appear poor. Flat mood and affect    Data Reviewed: I have personally reviewed following labs and imaging studies  CBC: Recent Labs  Lab 03/14/21 0550 03/15/21 0509 03/16/21 0509 03/17/21 0521 03/18/21 0400  WBC 4.1 3.9* 4.3 4.0 4.4  HGB 9.8* 9.8* 10.0* 9.3* 9.9*  HCT 29.7* 30.1* 30.1* 29.1* 30.9*  MCV 83.4 82.9 83.4 82.9 82.0  PLT 248 264 257 251 [id number]   Basic Metabolic Panel: Recent Labs  Lab 03/16/21 0509 03/17/21 0521 03/18/21 0400 03/19/21 0418 03/19/21 0941 03/19/21 1706 03/20/21 0558  NA 141 138 139  --  135  --  136  K 3.6 3.1* 3.2*  --  3.6  --  3.9  CL 112* 108 109  --  104  --  106  CO2 '26 25 25  '$ --  25  --  24  GLUCOSE 137* 146* 102* 122* 132*  --  131*  BUN 5* <5* <5*  --  <5*  --  9  CREATININE 0.89 0.74 0.80  --  0.79  --  0.76  CALCIUM 8.1* 7.8* 7.8*  --  7.6*  --  7.8*  MG  --   --  1.7  --  2.0 1.8 2.1  PHOS  --   --   --   --  2.6 2.4* 2.4*   GFR: Estimated Creatinine Clearance: 71.1 mL/min (by C-G formula based on SCr of 0.76 mg/dL). Liver Function Tests: No results for input(s): AST, ALT, ALKPHOS, BILITOT, PROT,  ALBUMIN in the last 168 hours.  No results for input(s): LIPASE, AMYLASE in the last 168 hours. No results for input(s): AMMONIA in the last 168 hours. Coagulation Profile: No results for input(s): INR, PROTIME in the last 168 hours.  Cardiac Enzymes: No results for input(s): CKTOTAL, CKMB, CKMBINDEX, TROPONINI in the last 168 hours. BNP (last 3 results) No results for input(s): PROBNP in the last 8760 hours. HbA1C: No results for input(s): HGBA1C in the last 72 hours. CBG: Recent Labs  Lab 03/19/21 2054 03/19/21 2355 03/20/21  [id number] 03/20/21 0739 03/20/21 1219  GLUCAP 108* 132* 157* 129* 144*   Lipid Profile: No results for input(s): CHOL, HDL, LDLCALC, TRIG, CHOLHDL, LDLDIRECT in the last 72 hours. Thyroid Function Tests: No results for input(s): TSH, T4TOTAL, FREET4, T3FREE, THYROIDAB in the last 72 hours. Anemia Panel: No results for input(s): VITAMINB12, FOLATE, FERRITIN, TIBC, IRON, RETICCTPCT in the last 72 hours. Sepsis Labs: No results for input(s): PROCALCITON, LATICACIDVEN in the last 168 hours.   Recent Results (from the past 240 hour(s))  Resp Panel by RT-PCR (Flu A&B, Covid) Nasopharyngeal Swab     Status: None   Collection Time: 03/11/21 12:50 PM   Specimen: Nasopharyngeal Swab; Nasopharyngeal(NP) swabs in vial transport medium  Result Value Ref Range Status   SARS Coronavirus 2 by RT PCR NEGATIVE NEGATIVE Final    Comment: (NOTE) SARS-CoV-2 target nucleic acids are NOT DETECTED.  The SARS-CoV-2 RNA is generally detectable in upper respiratory specimens during the acute phase of infection. The lowest concentration of SARS-CoV-2 viral copies this assay can detect is 138 copies/mL. A negative result does not preclude SARS-Cov-2 infection and should not be used as the sole basis for treatment or other patient management decisions. A negative result may occur with  improper specimen collection/handling, submission of specimen other than nasopharyngeal swab,  presence of viral mutation(s) within the areas targeted by this assay, and inadequate number of viral copies(<138 copies/mL). A negative result must be combined with clinical observations, patient history, and epidemiological information. The expected result is Negative.  Fact Sheet for Patients:  EntrepreneurPulse.com.au  Fact Sheet for Healthcare Providers:  IncredibleEmployment.be  This test is no t yet approved or cleared by the [place] FDA and  has been authorized for detection and/or diagnosis of SARS-CoV-2 by FDA under an Emergency Use Authorization (EUA). This EUA will remain  in effect (meaning this test can be used) for the duration of the COVID-19 declaration under Section 564(b)(1) of the Act, 21 U.S.C.section 360bbb-3(b)(1), unless the authorization is terminated  or revoked sooner.       Influenza A by PCR NEGATIVE NEGATIVE Final   Influenza B by PCR NEGATIVE NEGATIVE Final    Comment: (NOTE) The Xpert Xpress SARS-CoV-2/FLU/RSV plus assay is intended as an aid in the diagnosis of influenza from Nasopharyngeal swab specimens and should not be used as a sole basis for treatment. Nasal washings and aspirates are unacceptable for Xpert Xpress SARS-CoV-2/FLU/RSV testing.  Fact Sheet for Patients: EntrepreneurPulse.com.au  Fact Sheet for Healthcare Providers: IncredibleEmployment.be  This test is not yet approved or cleared by the [place] FDA and has been authorized for detection and/or diagnosis of SARS-CoV-2 by FDA under an Emergency Use Authorization (EUA). This EUA will remain in effect (meaning this test can be used) for the duration of the COVID-19 declaration under Section 564(b)(1) of the Act, 21 U.S.C. section 360bbb-3(b)(1), unless the authorization is terminated or revoked.  Performed at [facility], [address] [place], [place] [address]   Resp Panel by  RT-PCR (Flu A&B, Covid) Nasopharyngeal Swab     Status: None   Collection Time: 03/12/21  3:00 PM   Specimen: Nasopharyngeal Swab; Nasopharyngeal(NP) swabs in vial transport medium  Result Value Ref Range Status   SARS Coronavirus 2 by RT PCR NEGATIVE NEGATIVE Final    Comment: (NOTE) SARS-CoV-2 target nucleic acids are NOT DETECTED.  The SARS-CoV-2 RNA is generally detectable in upper respiratory specimens during the acute phase of infection. The lowest concentration of SARS-CoV-2 viral copies  this assay can detect is 138 copies/mL. A negative result does not preclude SARS-Cov-2 infection and should not be used as the sole basis for treatment or other patient management decisions. A negative result may occur with  improper specimen collection/handling, submission of specimen other than nasopharyngeal swab, presence of viral mutation(s) within the areas targeted by this assay, and inadequate number of viral copies(<138 copies/mL). A negative result must be combined with clinical observations, patient history, and epidemiological information. The expected result is Negative.  Fact Sheet for Patients:  EntrepreneurPulse.com.au  Fact Sheet for Healthcare Providers:  IncredibleEmployment.be  This test is no t yet approved or cleared by the [place] FDA and  has been authorized for detection and/or diagnosis of SARS-CoV-2 by FDA under an Emergency Use Authorization (EUA). This EUA will remain  in effect (meaning this test can be used) for the duration of the COVID-19 declaration under Section 564(b)(1) of the Act, 21 U.S.C.section 360bbb-3(b)(1), unless the authorization is terminated  or revoked sooner.       Influenza A by PCR NEGATIVE NEGATIVE Final   Influenza B by PCR NEGATIVE NEGATIVE Final    Comment: (NOTE) The Xpert Xpress SARS-CoV-2/FLU/RSV plus assay is intended as an aid in the diagnosis of influenza from Nasopharyngeal swab  specimens and should not be used as a sole basis for treatment. Nasal washings and aspirates are unacceptable for Xpert Xpress SARS-CoV-2/FLU/RSV testing.  Fact Sheet for Patients: EntrepreneurPulse.com.au  Fact Sheet for Healthcare Providers: IncredibleEmployment.be  This test is not yet approved or cleared by the [place] FDA and has been authorized for detection and/or diagnosis of SARS-CoV-2 by FDA under an Emergency Use Authorization (EUA). This EUA will remain in effect (meaning this test can be used) for the duration of the COVID-19 declaration under Section 564(b)(1) of the Act, 21 U.S.C. section 360bbb-3(b)(1), unless the authorization is terminated or revoked.  Performed at [facility], [address] [place], [place] [address]   Culture, blood (Routine X 2) w Reflex to ID Panel     Status: None   Collection Time: 03/12/21 10:42 PM   Specimen: BLOOD  Result Value Ref Range Status   Specimen Description BLOOD LEFT FOREARM  Final   Special Requests   Final    BOTTLES DRAWN AEROBIC AND ANAEROBIC Blood Culture adequate volume   Culture   Final    NO GROWTH 5 DAYS Performed at [facility], [address], [place], [place] [address]    Report Status 03/17/2021 FINAL  Final  Culture, blood (Routine X 2) w Reflex to ID Panel     Status: None   Collection Time: 03/12/21 10:44 PM   Specimen: BLOOD  Result Value Ref Range Status   Specimen Description BLOOD LEFT ASSIST CONTROL  Final   Special Requests   Final    BOTTLES DRAWN AEROBIC ONLY Blood Culture adequate volume   Culture   Final    NO GROWTH 5 DAYS Performed at [facility], [address], [place], [place] [address]    Report Status 03/17/2021 FINAL  Final         Radiology Studies: No results found.      Scheduled Meds:  atorvastatin  20 mg Per Tube Daily   enoxaparin (LOVENOX) injection  40 mg Subcutaneous Q24H   famotidine  (PEPCID) IV  20 mg Intravenous Q12H   free water  100 mL Per Tube Q4H   guaiFENesin  15 mL Per Tube Q6H   lisinopril  20 mg Per Tube Daily   pantoprazole (PROTONIX) IV  40 mg Intravenous Daily   phosphorus  500 mg Per Tube Daily   Continuous Infusions:  feeding supplement (GLUCERNA 1.5 CAL) 45 mL/hr at 03/20/21 0200     LOS: 8 days    Time spent: 25  mins     Desma Maxim, MD [facility]   If 7PM-7AM, please contact night-coverage [email or web address] 03/20/2021, 2:07 PM

## 2021-03-20 NOTE — TOC Progression Note (Addendum)
Transition of Care [facility]) - Progression Note    Patient Details  Name: Louis Stone MRN: [id number] Date of Birth: 1950-07-24  Transition [facility]) CM/SW [place], RN Phone Number: 03/20/2021, 2:33 PM  Clinical Narrative:   Potential discharge tomorrow, [employer] aware of this and will set up for G tube feeding and equipment.  Patient will need to go home with 3-5 days of feedings, as they will need to be ordered by [employer].  Nutrition aware of this request.    [employer] is processing order at this time.  Patient is experienced, as he states he has had a G tube "years ago".Awaitng response.  Addendum:  at 415pm today, team was made aware that Glucerna is not approved, patient will need Jevity.  RD looking into sending patient home with 3-5 days and providing appropriate education to patient for bolus feeds as well.  Expected Discharge Plan:  (TBD) Barriers to Discharge: Continued Medical Work up  Expected Discharge Plan and Services Expected Discharge Plan:  (TBD)       Living arrangements for the past 2 months: Single Family Home                 DME Arranged:  (TBD)                 Representative spoke with at [place]: Per Tommi Rumps at [place], patient was assigned but not picked up due to home conditions   Social Determinants of Health (SDOH) Interventions    Readmission Risk Interventions Readmission Risk Prevention Plan 02/03/2021  Transportation Screening Complete  PCP or Specialist Appt within 3-5 Days Complete  HRI or [place] Complete  Social Work Consult for [place] Planning/Counseling Complete  Palliative Care Screening Not Applicable  Medication Review Press photographer) Complete  Some recent data might be hidden

## 2021-03-21 DIAGNOSIS — J690 Pneumonitis due to inhalation of food and vomit: Secondary | ICD-10-CM | POA: Diagnosis not present

## 2021-03-21 LAB — PHOSPHORUS: Phosphorus: 2.8 mg/dL (ref 2.5–4.6)

## 2021-03-21 LAB — BASIC METABOLIC PANEL
Anion gap: 6 (ref 5–15)
BUN: 9 mg/dL (ref 8–23)
CO2: 27 mmol/L (ref 22–32)
Calcium: 8 mg/dL — ABNORMAL LOW (ref 8.9–10.3)
Chloride: 102 mmol/L (ref 98–111)
Creatinine, Ser: 0.75 mg/dL (ref 0.61–1.24)
GFR, Estimated: >60 mL/min (ref >60)
Glucose, Bld: 148 mg/dL — ABNORMAL HIGH (ref 70–99)
Potassium: 4 mmol/L (ref 3.5–5.1)
Sodium: 135 mmol/L (ref 135–145)

## 2021-03-21 LAB — GLUCOSE, CAPILLARY
Glucose-Capillary: 114 mg/dL — ABNORMAL HIGH (ref 70–99)
Glucose-Capillary: 165 mg/dL — ABNORMAL HIGH (ref 70–99)
Glucose-Capillary: 189 mg/dL — ABNORMAL HIGH (ref 70–99)

## 2021-03-21 LAB — MAGNESIUM: Magnesium: 2 mg/dL (ref 1.7–2.4)

## 2021-03-21 MED ORDER — LISINOPRIL 5 MG PO TABS: 5.0000 mg | ORAL_TABLET | Freq: Every day | ORAL | Status: DC

## 2021-03-21 MED ORDER — JEVITY 1.5 CAL/FIBER PO LIQD: 120.0000 mL | Freq: Every day | ORAL | Status: DC

## 2021-03-21 MED ORDER — FREE WATER: 100.0000 mL | Freq: Every day | Status: AC

## 2021-03-21 MED ORDER — ATORVASTATIN CALCIUM 20 MG PO TABS: 20.0000 mg | ORAL_TABLET | Freq: Every day | ORAL | Status: AC

## 2021-03-21 MED ORDER — FAMOTIDINE 40 MG PO TABS: 40.0000 mg | ORAL_TABLET | Freq: Every day | ORAL | Status: AC

## 2021-03-21 NOTE — Progress Notes (Signed)
Provided education to patient regarding bolus feeds and medication administration through PEG tube. PEG tubing is not quite long enough for patient to have the dexterity to give boluses. PEG tubing also does not have a clamp and pinching the tubing was difficult to perform to prevent backflow of stomach contents. Provided small tubing extension with clamp to help with this issue.

## 2021-03-21 NOTE — TOC Transition Note (Signed)
Transition of Care [facility]) - CM/SW Discharge Note   Patient Details  Name: Louis Stone MRN: [id number] Date of Birth: [birth year]  Transition of [facility]) CM/SW Contact:  Harriet Masson, RN Phone Number:[phone] 03/21/2021, 10:07 AM   Clinical Narrative:    Pt will be discharged today on Jevity tube feeds (G-tube). Pt has 28 bags/cans reported by bedside RN to carry home today. TOC RN updated [employer] and Carolynn Sayers. Also called inpt Dietitian for confirmation on boluses (lvm). MD completed the required form  from [employer] to confirm pt's Jevity along with boluses once discharged today.   Team updated and Dr. Si Raider indicated he would update pt on the discharge for today and arrangements with [employer] to engage in his tube feed starting next week. Bedside notes indicated pt has been taught to manage his G-tube and boluses with an extension added for pt's convenience with managing his G-tube.   TOC remains available to address if needed.     Barriers to Discharge: Continued Medical Work up   Patient Goals and CMS Choice     Choice offered to / list presented to : NA  Discharge Placement                       Discharge Plan and Services                DME Arranged:  (TBD)                 Representative spoke with at [place]: Per Tommi Rumps at [place], patient was assigned but not picked up due to home conditions  Social Determinants of Health (SDOH) Interventions     Readmission Risk Interventions Readmission Risk Prevention Plan 02/03/2021  Transportation Screening Complete  PCP or Specialist Appt within 3-5 Days Complete  HRI or Home Care Consult Complete  Social Work Consult for [place] Planning/Counseling Complete  Palliative Care Screening Not Applicable  Medication Review Press photographer) Complete  Some recent data might be hidden

## 2021-03-21 NOTE — Progress Notes (Signed)
[name] to be D/C'd  per MD order.  Discussed with the patient and all questions fully answered.  VSS, Skin clean, dry and intact without evidence of skin break down, no evidence of skin tears noted.  IV catheter discontinued intact. Site without signs and symptoms of complications. Dressing and pressure applied.  An After Visit Summary was printed and given to the patient. Patient received prescription.  D/c education completed with patient/family including follow up instructions, medication list, d/c activities limitations if indicated, with other d/c instructions as indicated by MD - patient able to verbalize understanding, all questions fully answered.   Patient instructed to return to ED, call 911, or call MD for any changes in condition.   Patient to be escorted via [place], and D/C home via private auto.

## 2021-03-21 NOTE — Progress Notes (Addendum)
CSW scheduled a ride via [facility] [employer] to the patient's home - pickup time is 1:45pm.  Madilyn Fireman, MSW, LCSW Transitions of Care  Clinical Social Worker II [phone]

## 2021-03-21 NOTE — TOC Transition Note (Addendum)
Transition of Care [facility]) - CM/SW Discharge Note   Patient Details  Name: Louis Stone MRN: [id number] Date of Birth: 1951/04/07  Transition of Care [facility]) CM/SW Contact:  Harriet Masson, RN Phone Number:[phone] 03/21/2021, 12:29 PM   Clinical Narrative:    Issues with transportation arrangement. Supervisor via [facility]) called for assistance. Arrangements completed with pick up at 12:45 PM for transport to pt's home address. No other barriers or issues at this time. Floor called spoke with Kristen concerning ETA on pt's transportation and for pt to be present outside the facility awaiting pick-up. Also confirmed the pt's transport address for his discharge today.  TOC remains available if additional needs arise.  Addendum: Pt missed the transporter. Received an automatic call indicating transportation has been cancelled.  [facility] called once again for transportation for this pt. Currently awaiting a call back to resolve this issues.   Final next level of care: Home/Self Care Barriers to Discharge: Barriers Resolved   Patient Goals and CMS Choice     Choice offered to / list presented to : NA  Discharge Placement                  Name of family member notified: Tiffany (other) HIPAA message left. (TOC RN at bedside with attempts to call the family member (Tiffany)-unsuccessful only able to leave a vm.) Patient and family notified of of transfer: 03/21/21  Discharge Plan and Services                DME Arranged:  (TBD)                 Representative spoke with at [place]: Per Tommi Rumps at [place], patient was assigned but not picked up due to home conditions  Social Determinants of Health (SDOH) Interventions     Readmission Risk Interventions Readmission Risk Prevention Plan 02/03/2021  Transportation Screening Complete  PCP or Specialist Appt within 3-5 Days Complete  HRI or [place] Complete  Social Work Consult for  [place] Planning/Counseling Complete  Palliative Care Screening Not Applicable  Medication Review Press photographer) Complete  Some recent data might be hidden

## 2021-03-21 NOTE — Discharge Summary (Signed)
Louis Stone [id number] DOB: 1951/02/15 DOA: 03/12/2021  PCP: Theotis Burrow, MD  Admit date: 03/12/2021 Discharge date: 03/21/2021  Time spent: 35 minutes  Recommendations for Outpatient Follow-up:  Re-establish with ENT (Dr. Pryor Ochoa) Close PCP f/u     Discharge Diagnoses:  Active Problems:   Aspiration pneumonia [facility])   Discharge Condition: stable  Diet recommendation: tube feeds  Filed Weights   03/12/21 2105 03/20/21 0500 03/21/21 0500  Weight: 54.3 kg 63.6 kg 63.6 kg    History of present illness:  Louis Stone is a 70 y.o. African-American male with medical history significant for osteoarthritis, type 2 diabetes mellitus, oropharyngeal stage IV squamous cell carcinoma, status post chemotherapy and radiotherapy and recent admission from 7/31 till 8/5 for acute hypoxic respiratory failure due to multifocal pneumonia as well as ED visits recently including 8/15, who presented to the emergency room with acute onset of generalized weakness.  The patient refused SNF placement on 8/15.  He is currently feeling so weak he can hardly stand up without help.  He admitted to cough productive of whitish sputum without wheezing however with mild dyspnea.  No recent falls or injuries.  He has mild headache without dizziness or blurred vision.  No chest pain or palpitations.  No nausea or vomiting or diarrhea or abdominal pain.  No dysuria, oliguria or hematuria or flank pain.  He is currently agreeable to go to rehab after his discharge.  Hospital Course:  # Aspiration pneumonia # Sepsis Resolved. Sepsis criteria by tachycardia, tachypnea. S/p 6 days of abx (unasyn). 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 Swallow study w/ severe dysphagia. Followed by SLP. PEG tube placed this hospitalization. Unfortunately does not qualify for home health given hx roaches and bed bugs in home. Patient has had PEG tube in the past and thinks he can manage it.  He had teaching and was able to manage his own feeds day of discharge. Will be sent home with 5 days worth of feeds, adapt to supply more next week.  - close pcp f/u - will need f/u w/ ENT, Dr. Pryor Ochoa   # T2DM Glucose wnl, has not required insulin  Oropharyngeal squamous cell carcinoma: stage IV.  - outpt ent f/u  Procedures: IR PEG tube placement 9/14  Consultations: IR  Discharge Exam: Vitals:   03/21/21 0633 03/21/21 0749  BP: 109/66 128/74  Pulse: 68 67  Resp: 16 16  Temp: 97.9 F (36.6 C) 98.2 F (36.8 C)  SpO2: 97% 98%    General exam: Appears calm. Appears older than stated age Abdomen: soft. Bandage over tube feed site Respiratory system: diminished breath sounds b/l bases Cardiovascular system: S1/S2+. No rubs or clicks  Gastrointestinal system: Abd is soft, NT, ND & hypoactive bowel sounds Central nervous system: Alert and oriented. Moves all extremities   Psychiatry: Judgement and insight appear poor. Flat mood and affect  Discharge Instructions   Discharge Instructions     Diet - low sodium heart healthy   Complete by: As directed    Increase activity slowly   Complete by: As directed       Allergies as of 03/21/2021   No Known Allergies      Medication List     STOP taking these medications    benzonatate 200 MG capsule Commonly known as: TESSALON   guaiFENesin 200 MG tablet       TAKE these medications    Accu-Chek Aviva Plus test strip Generic drug: glucose  blood   acetaminophen 325 MG tablet Commonly known as: TYLENOL Take 650 mg by mouth every 6 (six) hours as needed for headache or mild pain.   albuterol 108 (90 Base) MCG/ACT inhaler Commonly known as: VENTOLIN HFA Inhale 2 puffs into the lungs every 6 (six) hours as needed for wheezing.   atorvastatin 20 MG tablet Commonly known as: LIPITOR Place 1 tablet (20 mg total) into feeding tube daily. What changed: how to take this   famotidine 40 MG tablet Commonly known  as: PEPCID Place 1 tablet (40 mg total) into feeding tube daily. What changed: how to take this   feeding supplement (JEVITY 1.5 CAL/FIBER) Liqd Place 120 mLs into feeding tube 6 (six) times daily.   free water Soln Place 100 mLs into feeding tube 6 (six) times daily.   lisinopril 5 MG tablet Commonly known as: ZESTRIL Place 1 tablet (5 mg total) into feeding tube daily. What changed: how to take this   omeprazole 40 MG capsule Commonly known as: PRILOSEC Take 40 mg by mouth daily.   Toujeo SoloStar 300 UNIT/ML Solostar Pen Generic drug: insulin glargine (1 Unit Dial) Inject 15 Units into the skin daily.       No Known Allergies  Follow-up Information     Revelo, Elyse Jarvis, MD. Schedule an appointment as soon as possible for a visit.   Specialty: Family Medicine Contact information: [address] [place] [address] [phone]         Carloyn Manner, MD. Schedule an appointment as soon as possible for a visit.   Specialty: Otolaryngology Contact information: [place] [address] [phone]                  The results of significant diagnostics from this hospitalization (including imaging, microbiology, ancillary and laboratory) are listed below for reference.    Significant Diagnostic Studies: DG Chest 2 View  Result Date: 03/11/2021 CLINICAL DATA:  Weakness, recent pneumonia EXAM: CHEST - 2 VIEW COMPARISON:  02/16/2021 FINDINGS: The heart size and mediastinal contours are within normal limits. Both lungs are clear. The visualized skeletal structures are unremarkable. IMPRESSION: No acute abnormality of the lungs. Electronically Signed   By: Eddie Candle M.D.   On: 03/11/2021 13:16   CT HEAD WO CONTRAST  Result Date: 03/11/2021 CLINICAL DATA:  Transient ischemic attack.  Slurred speech. EXAM: CT HEAD WITHOUT CONTRAST TECHNIQUE: Contiguous axial images were obtained from the base of the skull  through the vertex without intravenous contrast. COMPARISON:  07/16/2020 FINDINGS: Brain: There is no evidence for acute hemorrhage, hydrocephalus, mass lesion, or abnormal extra-axial fluid collection. No definite CT evidence for acute infarction. Patchy low attenuation in the deep hemispheric and periventricular white matter is nonspecific, but likely reflects chronic microvascular ischemic demyelination. Vascular: No hyperdense vessel or unexpected calcification. Skull: No evidence for fracture. No worrisome lytic or sclerotic lesion. Sinuses/Orbits: The visualized paranasal sinuses and mastoid air cells are clear. Visualized portions of the globes and intraorbital fat are unremarkable. Other: None. IMPRESSION: 1. Stable.  No acute intracranial abnormality. 2. Chronic small vessel white matter ischemic disease. Electronically Signed   By: Misty Stanley M.D.   On: 03/11/2021 13:32   CT Angio Chest PE W and/or Wo Contrast  Result Date: 03/12/2021 CLINICAL DATA:  Positive D-dimer. EXAM: CT ANGIOGRAPHY CHEST WITH CONTRAST TECHNIQUE: Multidetector CT imaging of the chest was performed using the standard protocol during bolus administration of intravenous contrast. Multiplanar CT  image reconstructions and MIPs were obtained to evaluate the vascular anatomy. CONTRAST:  57m OMNIPAQUE IOHEXOL 350 MG/ML SOLN COMPARISON:  October 08, 2015. FINDINGS: Cardiovascular: Satisfactory opacification of the pulmonary arteries to the segmental level. No evidence of pulmonary embolism. Normal heart size. No pericardial effusion. Atherosclerosis of thoracic aorta is noted without aneurysm formation. Mediastinum/Nodes: No enlarged mediastinal, hilar, or axillary lymph nodes. Thyroid gland, trachea, and esophagus demonstrate no significant findings. Lungs/Pleura: No pneumothorax or pleural effusion is noted. Stable biapical scarring is noted. Aspirated material is noted in the trachea and right mainstem bronchus. Multiple ill-defined  opacities are noted in both lung bases, left greater than right, concerning for multifocal pneumonia. Upper Abdomen: No acute abnormality. Musculoskeletal: No chest wall abnormality. No acute or significant osseous findings. Review of the MIP images confirms the above findings. IMPRESSION: No definite evidence of pulmonary embolus. Multiple ill-defined opacities are noted in both lung bases, left greater than right, concerning for multifocal pneumonia. Aspirated material is noted in the trachea and right mainstem bronchus. Aortic Atherosclerosis (ICD10-I70.0). Electronically Signed   By: JMarijo ConceptionM.D.   On: 03/12/2021 17:55   IR GASTROSTOMY TUBE MOD SED  Result Date: 03/18/2021 INDICATION: Malnutrition in the setting of cancer EXAM: Placement of a percutaneous gastrostomy tube using fluoroscopic guidance MEDICATIONS: Glucagon 1 mg IV ANESTHESIA/SEDATION: Versed 0.5 mg IV; Fentanyl 25 mcg IV Moderate Sedation Time:  16 The patient was continuously monitored during the procedure by the interventional radiology nurse under my direct supervision. CONTRAST:  10 mL-administered into the gastric lumen. FLUOROSCOPY TIME:  Fluoroscopy Time: 3 minutes 12 seconds (13 mGy). COMPLICATIONS: None immediate. PROCEDURE: Informed written consent was obtained from the patient after a thorough discussion of the procedural risks, benefits and alternatives. All questions were addressed. Maximal Sterile Barrier Technique was utilized including caps, mask, sterile gowns, sterile gloves, sterile drape, hand hygiene and skin antiseptic. A timeout was performed prior to the initiation of the procedure. Oral contrast material was administered on the previous day to the procedure. Abdominal film taken on the morning of the procedure day demonstrated contrast material within the large bowel. The patient was placed supine on the exam table. The patient had a pre-existing enteric tube which was retracted such that the tip was positioned  within the gastric lumen. The stomach was then insufflated with air. After insufflation with air, there appeared to be a safe percutaneous window for gastrostomy tube placement. The abdomen was prepped and draped in the standard sterile fashion. After insufflating the stomach with air, puncture sites were selected and local analgesia was obtained with 1% lidocaine. Using fluoroscopic guidance, a gastropexy needle was advanced into the stomach and the T-bar suture was released. Entry into the stomach was confirmed with fluoroscopy, aspiration of air, and injection of contrast material. This was repeated with an additional gastropexy suture (for a total of 3 fasteners). At the center of these gastropexy sutures, a dermatotomy was performed. An 18 gauge needle was then passed into the stomach, and position within the gastric lumen again confirmed under fluoroscopy using aspiration of air and injection of contrast material. An Amplatz guidewire was passed through this needle and intraluminal placement was confirmed with fluoroscopy. The needle was removed, and over the guidewire, a 18 French balloon gastrostomy tube with a coaxial 10 mm balloon was advanced into the percutaneous tract. Dilation of the percutaneous tract was then performed using the balloon, followed by advancement of the gastrostomy tube into the gastric lumen. The retention balloon was  then inflated with 53m of sterile water, and the tube was brought back to the gastric wall. The wire and balloon were removed. The external bumper was brought to the skin. Location of the gastrostomy tube within the stomach was then confirmed with injection of contrast material, opacifying the gastric lumen. The gastrostomy tube was flushed with sterile water, and secured to the skin using a dressing. The patient tolerated the procedure well without immediate complication, and was transferred to recovery in stable condition. IMPRESSION: Successful placement of a  percutaneous 116French balloon gastrostomy tube using fluoroscopic guidance. Electronically Signed   By: YAlbin FellingM.D.   On: 03/18/2021 14:15   DG Chest Port 1 View  Result Date: 03/12/2021 CLINICAL DATA:  Weakness EXAM: PORTABLE CHEST 1 VIEW COMPARISON:  Chest radiograph 03/11/2021 FINDINGS: The cardiomediastinal silhouette is stable. There are patchy opacities in the lateral left base which are increased since yesterday's study. There is no other focal airspace disease. There is no pleural effusion or pneumothorax. There is no acute osseous abnormality. IMPRESSION: New patchy opacities in the lateral left base may reflect infection or aspiration in the correct clinical setting. Electronically Signed   By: PValetta MoleM.D.   On: 03/12/2021 15:15   DG Abd Portable 1V  Result Date: 03/18/2021 CLINICAL DATA:  Pre gastrostomy tube placement EXAM: PORTABLE ABDOMEN - 1 VIEW COMPARISON:  None. FINDINGS: There is contrast material within the large bowel. An enteric tube is seen overlying the mid abdomen, either in the gastric body, or possibly post pyloric. Numerous gas-filled loops of bowel are noted, which are normal in caliber. No acute osseous abnormality. IMPRESSION: There is contrast material seen in the large bowel. Numerous nondilated air-filled loops of bowel. Electronically Signed   By: YAlbin FellingM.D.   On: 03/18/2021 08:24   DG NLoyce DysTube Plc W/Fl W/Rad  Result Date: 03/17/2021 CLINICAL DATA:  NG tube placement prior to G-tube placement. EXAM: NASO G TUBE PLACEMENT WITH FL AND WITH RAD CONTRAST:  75 mL Omnipaque 300 FLUOROSCOPY TIME:  Fluoroscopy Time:  12 seconds Radiation Exposure Index (if provided by the fluoroscopic device): 1 mGy Number of Acquired Spot Images: I COMPARISON:  None. FINDINGS: A 10 French nasogastric tube was placed through the left nostril and advanced into the stomach without difficulty. The tube was secured to the nose. 75 mL of Omnipaque 300 was hand injected  through the nasogastric tube opacifying the stomach and proximal small bowel. IMPRESSION: Successful placement of a nasogastric tube with the tip in the proximal duodenum. Electronically Signed   By: HKathreen DevoidM.D.   On: 03/17/2021 17:01    Microbiology: Recent Results (from the past 240 hour(s))  Resp Panel by RT-PCR (Flu A&B, Covid) Nasopharyngeal Swab     Status: None   Collection Time: 03/11/21 12:50 PM   Specimen: Nasopharyngeal Swab; Nasopharyngeal(NP) swabs in vial transport medium  Result Value Ref Range Status   SARS Coronavirus 2 by RT PCR NEGATIVE NEGATIVE Final    Comment: (NOTE) SARS-CoV-2 target nucleic acids are NOT DETECTED.  The SARS-CoV-2 RNA is generally detectable in upper respiratory specimens during the acute phase of infection. The lowest concentration of SARS-CoV-2 viral copies this assay can detect is 138 copies/mL. A negative result does not preclude SARS-Cov-2 infection and should not be used as the sole basis for treatment or other patient management decisions. A negative result may occur with  improper specimen collection/handling, submission of specimen other than nasopharyngeal swab, presence of  viral mutation(s) within the areas targeted by this assay, and inadequate number of viral copies(<138 copies/mL). A negative result must be combined with clinical observations, patient history, and epidemiological information. The expected result is Negative.  Fact Sheet for Patients:  EntrepreneurPulse.com.au  Fact Sheet for Healthcare Providers:  IncredibleEmployment.be  This test is no t yet approved or cleared by the [place] FDA and  has been authorized for detection and/or diagnosis of SARS-CoV-2 by FDA under an Emergency Use Authorization (EUA). This EUA will remain  in effect (meaning this test can be used) for the duration of the COVID-19 declaration under Section 564(b)(1) of the Act, 21 U.S.C.section  360bbb-3(b)(1), unless the authorization is terminated  or revoked sooner.       Influenza A by PCR NEGATIVE NEGATIVE Final   Influenza B by PCR NEGATIVE NEGATIVE Final    Comment: (NOTE) The Xpert Xpress SARS-CoV-2/FLU/RSV plus assay is intended as an aid in the diagnosis of influenza from Nasopharyngeal swab specimens and should not be used as a sole basis for treatment. Nasal washings and aspirates are unacceptable for Xpert Xpress SARS-CoV-2/FLU/RSV testing.  Fact Sheet for Patients: EntrepreneurPulse.com.au  Fact Sheet for Healthcare Providers: IncredibleEmployment.be  This test is not yet approved or cleared by the [place] FDA and has been authorized for detection and/or diagnosis of SARS-CoV-2 by FDA under an Emergency Use Authorization (EUA). This EUA will remain in effect (meaning this test can be used) for the duration of the COVID-19 declaration under Section 564(b)(1) of the Act, 21 U.S.C. section 360bbb-3(b)(1), unless the authorization is terminated or revoked.  Performed at [facility], [address] [place],  [address]   Resp Panel by RT-PCR (Flu A&B, Covid) Nasopharyngeal Swab     Status: None   Collection Time: 03/12/21  3:00 PM   Specimen: Nasopharyngeal Swab; Nasopharyngeal(NP) swabs in vial transport medium  Result Value Ref Range Status   SARS Coronavirus 2 by RT PCR NEGATIVE NEGATIVE Final    Comment: (NOTE) SARS-CoV-2 target nucleic acids are NOT DETECTED.  The SARS-CoV-2 RNA is generally detectable in upper respiratory specimens during the acute phase of infection. The lowest concentration of SARS-CoV-2 viral copies this assay can detect is 138 copies/mL. A negative result does not preclude SARS-Cov-2 infection and should not be used as the sole basis for treatment or other patient management decisions. A negative result may occur with  improper specimen collection/handling, submission  of specimen other than nasopharyngeal swab, presence of viral mutation(s) within the areas targeted by this assay, and inadequate number of viral copies(<138 copies/mL). A negative result must be combined with clinical observations, patient history, and epidemiological information. The expected result is Negative.  Fact Sheet for Patients:  EntrepreneurPulse.com.au  Fact Sheet for Healthcare Providers:  IncredibleEmployment.be  This test is no t yet approved or cleared by the [place] FDA and  has been authorized for detection and/or diagnosis of SARS-CoV-2 by FDA under an Emergency Use Authorization (EUA). This EUA will remain  in effect (meaning this test can be used) for the duration of the COVID-19 declaration under Section 564(b)(1) of the Act, 21 U.S.C.section 360bbb-3(b)(1), unless the authorization is terminated  or revoked sooner.       Influenza A by PCR NEGATIVE NEGATIVE Final   Influenza B by PCR NEGATIVE NEGATIVE Final    Comment: (NOTE) The Xpert Xpress SARS-CoV-2/FLU/RSV plus assay is intended as an aid in the diagnosis of influenza from Nasopharyngeal swab specimens and should not be used  as a sole basis for treatment. Nasal washings and aspirates are unacceptable for Xpert Xpress SARS-CoV-2/FLU/RSV testing.  Fact Sheet for Patients: EntrepreneurPulse.com.au  Fact Sheet for Healthcare Providers: IncredibleEmployment.be  This test is not yet approved or cleared by the [place] FDA and has been authorized for detection and/or diagnosis of SARS-CoV-2 by FDA under an Emergency Use Authorization (EUA). This EUA will remain in effect (meaning this test can be used) for the duration of the COVID-19 declaration under Section 564(b)(1) of the Act, 21 U.S.C. section 360bbb-3(b)(1), unless the authorization is terminated or revoked.  Performed at [facility], [address] [place], [place] [address]   Culture, blood (Routine X 2) w Reflex to ID Panel     Status: None   Collection Time: 03/12/21 10:42 PM   Specimen: BLOOD  Result Value Ref Range Status   Specimen Description BLOOD LEFT FOREARM  Final   Special Requests   Final    BOTTLES DRAWN AEROBIC AND ANAEROBIC Blood Culture adequate volume   Culture   Final    NO GROWTH 5 DAYS Performed at [facility], [address] [place], [place] [address]    Report Status 03/17/2021 FINAL  Final  Culture, blood (Routine X 2) w Reflex to ID Panel     Status: None   Collection Time: 03/12/21 10:44 PM   Specimen: BLOOD  Result Value Ref Range Status   Specimen Description BLOOD LEFT ASSIST CONTROL  Final   Special Requests   Final    BOTTLES DRAWN AEROBIC ONLY Blood Culture adequate volume   Culture   Final    NO GROWTH 5 DAYS Performed at [facility], [address] [place], [place] [address]    Report Status 03/17/2021 FINAL  Final     Labs: Basic Metabolic Panel: Recent Labs  Lab 03/17/21 0521 03/17/21 0521 03/18/21 0400 03/19/21 0418 03/19/21 0941 03/19/21 1706 03/20/21 0558 03/20/21 1701 03/21/21 0620  NA 138  --  139  --  135  --  136  --  135  K 3.1*  --  3.2*  --  3.6  --  3.9  --  4.0  CL 108  --  109  --  104  --  106  --  102  CO2 25  --  25  --  25  --  24  --  27  GLUCOSE 146*  --  102* 122* 132*  --  131*  --  148*  BUN <5*  --  <5*  --  <5*  --  9  --  9  CREATININE 0.74  --  0.80  --  0.79  --  0.76  --  0.75  CALCIUM 7.8*  --  7.8*  --  7.6*  --  7.8*  --  8.0*  MG  --    < > 1.7  --  2.0 1.8 2.1 2.1 2.0  PHOS  --   --   --   --  2.6 2.4* 2.4* 2.5 2.8   < > = values in this interval not displayed.   Liver Function Tests: No results for input(s): AST, ALT, ALKPHOS, BILITOT, PROT, ALBUMIN in the last 168 hours. No results for input(s): LIPASE, AMYLASE in the last 168 hours. No results for input(s): AMMONIA in the last 168 hours. CBC: Recent Labs   Lab 03/15/21 0509 03/16/21 0509 03/17/21 0521 03/18/21 0400  WBC 3.9* 4.3 4.0 4.4  HGB 9.8* 10.0* 9.3* 9.9*  HCT 30.1* 30.1* 29.1* 30.9*  MCV 82.9 83.4 82.9 82.0  PLT 264 257 251 231   Cardiac Enzymes: No results for input(s): CKTOTAL, CKMB, CKMBINDEX, TROPONINI in the last 168 hours. BNP: BNP (last 3 results) Recent Labs    02/01/21 1209  BNP 86.8    ProBNP (last 3 results) No results for input(s): PROBNP in the last 8760 hours.  CBG: Recent Labs  Lab 03/20/21 1219 03/20/21 1614 03/20/21 2006 03/21/21 0528 03/21/21 0753  GLUCAP 144* 109* 141* 114* 165*       Signed:  Desma Maxim MD.  [facility] 03/21/2021, 11:05 AM

## 2021-03-24 ENCOUNTER — Telehealth: Payer: Self-pay | Admitting: *Deleted

## 2021-03-24 NOTE — Telephone Encounter (Signed)
Patient called reporting that his feeding tube keeps coming out and he keeps putting it back in. He wants some help with this. [phone]

## 2021-03-25 ENCOUNTER — Ambulatory Visit
Admission: RE | Admit: 2021-03-25 | Discharge: 2021-03-25 | Disposition: A | Discharge status: Home / Self Care | Payer: Medicare Other | Source: Ambulatory Visit | Attending: Interventional Radiology | Admitting: Interventional Radiology

## 2021-03-25 ENCOUNTER — Other Ambulatory Visit (HOSPITAL_COMMUNITY): Payer: Self-pay | Admitting: Interventional Radiology

## 2021-03-25 ENCOUNTER — Other Ambulatory Visit: Payer: Self-pay

## 2021-03-25 DIAGNOSIS — K9423 Gastrostomy malfunction: Secondary | ICD-10-CM | POA: Insufficient documentation

## 2021-03-25 DIAGNOSIS — Z931 Gastrostomy status: Secondary | ICD-10-CM

## 2021-03-25 DIAGNOSIS — Z431 Encounter for attention to gastrostomy: Secondary | ICD-10-CM | POA: Insufficient documentation

## 2021-03-25 HISTORY — PX: IR REPLC GASTRO/COLONIC TUBE PERCUT W/FLUORO: IMG2333

## 2021-03-25 MED ORDER — LIDOCAINE HCL 1 % IJ SOLN
INTRAMUSCULAR | Status: AC
  Administered 2021-03-25: 5 mL
  Filled 2021-03-25: qty 20

## 2021-03-25 MED ORDER — IOHEXOL 300 MG/ML  SOLN
15.0000 mL | Freq: Once | INTRAMUSCULAR | Status: AC | PRN
  Administered 2021-03-25: 15 mL
  Filled 2021-03-25: qty 15

## 2021-03-25 NOTE — Progress Notes (Signed)
This gentleman post gtube placement 9/114/2022, coming to IR today with leaking G tube for exchange. Upon arrival to admitting desk, noting bed bugs crawling on shirt. Patient appearing unkempt, as dressing off to replace Gtube in Ir, noting visible roach underneath. Vitals stable pre and post procedure. Encouraged patient to reach out to agency for spraying his house for bugs as well as reinstructed on how to use PEG for feeds and flush. Called and spoke with patients sister to explain home situation with update given. Also reached out to Acacia Villas in cancer center who is going to contact cancer center social worker to evaluate home situation.

## 2021-03-25 NOTE — Progress Notes (Signed)
Returned call to IR nurse , Forestine Chute, stating that patient was see today in IR for PEG tube evaluation. She states that patient had bed bugs crawling on clothes and a roach was seen underneath his dressing. Per Jocelyn Lamer, it sounds like patient lives alone and in very poor conditions. She reached out to his sister, but she thinks that they might not have a close relationship. Message sent to social work for assistance on this case and she suggested that we reach out to PCP since he is currently not on active treatment at the hospital.   I have reached out to Dr. Hardin Negus (PCP) office and spoe to Almyra Free, who stated that she would route informationto patient's PCP and their social worker.

## 2021-03-25 NOTE — Telephone Encounter (Signed)
Recommend pt to go to ER for eval, per MD.

## 2021-03-25 NOTE — Telephone Encounter (Signed)
Call returned to patient who does not want to go to ER, so I called radiology and spoke with Dr Denna Haggard  who will have his nurse contact patient to come in for evaluation of his tube.

## 2021-04-15 ENCOUNTER — Other Ambulatory Visit: Payer: Self-pay | Admitting: Interventional Radiology

## 2021-04-15 ENCOUNTER — Ambulatory Visit
Admission: RE | Admit: 2021-04-15 | Discharge: 2021-04-15 | Disposition: A | Discharge status: Home / Self Care | Payer: Medicare Other | Source: Ambulatory Visit | Attending: Interventional Radiology | Admitting: Interventional Radiology

## 2021-04-15 ENCOUNTER — Other Ambulatory Visit: Payer: Self-pay

## 2021-04-15 DIAGNOSIS — K9423 Gastrostomy malfunction: Secondary | ICD-10-CM

## 2021-04-15 HISTORY — PX: IR MECH REMOV OBSTRUC MAT ANY COLON TUBE W/FLUORO: IMG2335

## 2021-04-15 MED ORDER — IOHEXOL 350 MG/ML SOLN
8.0000 mL | Freq: Once | INTRAVENOUS | Status: AC | PRN
  Administered 2021-04-15: 8 mL
  Filled 2021-04-15: qty 10

## 2021-04-15 NOTE — Procedures (Signed)
PROCEDURE SUMMARY:  Successful declogging of existing 58F balloon retention gastrostomy tube with deglogger device. Retention T-tack sutures cut and removed without complication. Fluoroscopic image with injected contrast taken post procedure shows location within gastric lumen. Imaging reviewed by attending, Dr. Annamaria Boots. No immediate complications.  Pt tolerated well.  Patient instructed to inject water before and after pill administration as well as crush up medication thoroughly.  EBL N/A  Hedy Jacob PA-C 04/15/2021 11:35 AM

## 2021-04-20 ENCOUNTER — Emergency Department
Admission: EM | Admit: 2021-04-20 | Discharge: 2021-04-20 | Disposition: A | Discharge status: Home / Self Care | Payer: Medicare Other | Attending: Emergency Medicine | Admitting: Emergency Medicine

## 2021-04-20 ENCOUNTER — Other Ambulatory Visit: Payer: Self-pay

## 2021-04-20 ENCOUNTER — Emergency Department
Admission: EM | Admit: 2021-04-20 | Discharge: 2021-04-20 | Disposition: A | Discharge status: Home / Self Care | Payer: Medicare Other | Source: Home / Self Care | Attending: Emergency Medicine | Admitting: Emergency Medicine

## 2021-04-20 DIAGNOSIS — Z8616 Personal history of COVID-19: Secondary | ICD-10-CM | POA: Insufficient documentation

## 2021-04-20 DIAGNOSIS — K9423 Gastrostomy malfunction: Secondary | ICD-10-CM | POA: Insufficient documentation

## 2021-04-20 DIAGNOSIS — Z87891 Personal history of nicotine dependence: Secondary | ICD-10-CM | POA: Insufficient documentation

## 2021-04-20 DIAGNOSIS — Z85819 Personal history of malignant neoplasm of unspecified site of lip, oral cavity, and pharynx: Secondary | ICD-10-CM | POA: Diagnosis not present

## 2021-04-20 DIAGNOSIS — Z794 Long term (current) use of insulin: Secondary | ICD-10-CM | POA: Insufficient documentation

## 2021-04-20 DIAGNOSIS — Z85818 Personal history of malignant neoplasm of other sites of lip, oral cavity, and pharynx: Secondary | ICD-10-CM | POA: Insufficient documentation

## 2021-04-20 DIAGNOSIS — E1169 Type 2 diabetes mellitus with other specified complication: Secondary | ICD-10-CM | POA: Insufficient documentation

## 2021-04-20 DIAGNOSIS — E119 Type 2 diabetes mellitus without complications: Secondary | ICD-10-CM | POA: Insufficient documentation

## 2021-04-20 DIAGNOSIS — T85528A Displacement of other gastrointestinal prosthetic devices, implants and grafts, initial encounter: Secondary | ICD-10-CM

## 2021-04-20 NOTE — ED Triage Notes (Signed)
Pt comes in for a feeding tube problem. Pt states there is a hole in it. He states this happened today. Pt denies pain, N/V.

## 2021-04-20 NOTE — ED Triage Notes (Signed)
Pt states that he thinks he needs his g-tube replaced states that he thinks it has exploded, pt has wet gauze around the g-tube site

## 2021-04-20 NOTE — Discharge Instructions (Signed)
Your G-tube was initially clogged however it is now functioning properly.  Can continue to feed yourself as you are.

## 2021-04-20 NOTE — ED Provider Notes (Signed)
[facility]  ____________________________________________   Event Date/Time   First MD Initiated Contact with Patient 04/20/21 1657     (approximate)  I have reviewed the triage vital signs and the nursing notes.   HISTORY  Chief Complaint Abdominal Pain    HPI Louis Stone is a 70 y.o. 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  Patient tells me that he thinks there is a hole in it.  Today when he was feeding himself he was unable to and the tube feeds leaked all around onto his abdomen.  No other complaints at this time.  G-tube is placed during his most recent hospitalization on 9/14.         Past Medical History:  Diagnosis Date   Arthritis    per pt. I do not have md notes that indicate dx.   Diabetes mellitus without complication ([place])    Oropharynx cancer ([place]) 2009   squamous cell cancer  Stage T3, N2c, M0   Personal history of tobacco use, presenting hazards to health 10/08/2015    Patient Active Problem List   Diagnosis Date Noted   Aspiration pneumonia ([place]) 03/12/2021   Protein-calorie malnutrition, severe 02/02/2021   Acute respiratory failure with hypoxia ([place]) 02/01/2021   Squamous cell carcinoma of oropharynx ([place]) 02/01/2021   Acute renal failure (ARF) ([place]) 02/01/2021   Acute left-sided weakness 44/09/4740   Acute metabolic encephalopathy 59/56/3875   Mixed diabetic hyperlipidemia associated with type 2 diabetes mellitus ([place]) 07/16/2020   Uncontrolled type 2 diabetes mellitus with hypoglycemia without coma, with long-term current use of insulin ([place]) 07/16/2020   COVID-19 virus infection 07/16/2020   GERD without esophagitis 07/16/2020   Bradycardia 07/16/2020   Severe sepsis ([place]) 09/16/2016   Personal history of tobacco use, presenting hazards to  health 10/08/2015    Past Surgical History:  Procedure Laterality Date   IR GASTROSTOMY TUBE MOD SED  03/18/2021   IR MECH REMOV OBSTRUC MAT ANY COLON TUBE W/FLUORO  04/15/2021   IR [place] GASTRO/COLONIC TUBE PERCUT W/FLUORO  03/25/2021   THORACIC [place]   pt states ruptured disc and had surgery to repair it    Prior to Admission medications   Medication Sig Start Date End Date Taking? Authorizing Provider  ACCU-CHEK AVIVA PLUS test strip  09/04/20   [provider]  acetaminophen (TYLENOL) 325 MG tablet Take 650 mg by mouth every 6 (six) hours as needed for headache or mild pain.    [provider]  albuterol (PROVENTIL HFA;VENTOLIN HFA) 108 (90 Base) MCG/ACT inhaler Inhale 2 puffs into the lungs every 6 (six) hours as needed for wheezing.    [provider]  atorvastatin (LIPITOR) 20 MG tablet Place 1 tablet (20 mg total) into feeding tube daily. 03/21/21   Wouk, Ailene Rud, MD  famotidine (PEPCID) 40 MG tablet Place 1 tablet (40 mg total) into feeding tube daily. 03/21/21   Wouk, Ailene Rud, MD  lisinopril (ZESTRIL) 5 MG tablet Place 1 tablet (5 mg total) into feeding tube daily. 03/21/21   Wouk, Ailene Rud, MD  Nutritional Supplements (FEEDING SUPPLEMENT, JEVITY 1.5 CAL/FIBER,) LIQD Place 120 mLs into feeding tube 6 (six) times daily. 03/21/21   Wouk, Ailene Rud, MD  omeprazole (PRILOSEC) 40 MG capsule Take 40 mg by mouth daily. 06/06/20   [provider]  Obie Dredge  300 UNIT/ML Solostar Pen Inject 15 Units into the skin daily. 07/21/20   Terrilee Croak, MD  Water For Irrigation, Sterile (FREE WATER) SOLN Place 100 mLs into feeding tube 6 (six) times daily. 03/21/21   Wouk, Ailene Rud, MD    Allergies Patient has no known allergies.  Family History  Problem Relation Age of Onset   CVA Father    Prostate cancer Neg Hx        not sure what family member    Social History Social History   Tobacco Use   Smoking status: Former     Packs/day: 1.50    Years: 40.00    Pack years: 60.00    Types: Cigarettes    Quit date: 07/05/2006    Years since quitting: 14.8   Smokeless tobacco: Never  Substance Use Topics   Alcohol use: No    Comment: quit in 1991   Drug use: No    Review of Systems   Review of Systems  All other systems reviewed and are negative.  Physical Exam Updated Vital Signs BP 113/76 (BP Location: Right Arm)   Pulse 69   Temp 98.2 F (36.8 C) (Oral)   Resp 17   Ht 5\' 3"  (1.6 m)   Wt 59 kg   SpO2 99%   BMI 23.03 kg/m   Physical Exam Vitals and nursing note reviewed.  Constitutional:      General: He is not in acute distress.    Appearance: Normal appearance.     Comments: Patient is cachectic  HENT:     Head: Normocephalic and atraumatic.  Eyes:     General: No scleral icterus.    Conjunctiva/sclera: Conjunctivae normal.  Pulmonary:     Effort: Pulmonary effort is normal. No respiratory distress.     Breath sounds: Normal breath sounds. No wheezing.  Abdominal:     General: Abdomen is flat.     Palpations: Abdomen is soft.     Tenderness: There is no abdominal tenderness.     Comments: G-tube in place with no surrounding erythema  Musculoskeletal:        General: No deformity or signs of injury.     Cervical back: Normal range of motion.  Skin:    Coloration: Skin is not jaundiced or pale.  Neurological:     General: No focal deficit present.     Mental Status: He is alert and oriented to person, place, and time. Mental status is at baseline.  Psychiatric:        Mood and Affect: Mood normal.        Behavior: Behavior normal.     LABS (all labs ordered are listed, but only abnormal results are displayed)  Labs Reviewed - No data to display ____________________________________________  EKG  N/a ____________________________________________  RADIOLOGY Almeta Monas, personally viewed and evaluated these images (plain radiographs) as part of my medical  decision making, as well as reviewing the written report by the radiologist.  ED MD interpretation:  n/a    ____________________________________________   PROCEDURES  Procedure(s) performed (including Critical Care):  Procedures   ____________________________________________   INITIAL IMPRESSION / ASSESSMENT AND PLAN / ED COURSE     70 year old male presents with concern for G-tube malfunction.  When he was feeding himself today the food leaked around the G-tube onto his stomach.  The tube is in place.  Initially when I attempted to flush the tube, saline leaked out around onto the abdomen.  I was  done able to pull back and dislodge some clogged tube feed which resolve the issue.  Saline was flushing readily without any leakage.  Suspect that this was due to a clogged tube.  Patient is otherwise stable without acute complaints today.  He is stable for discharge.      ____________________________________________   FINAL CLINICAL IMPRESSION(S) / ED DIAGNOSES  Final diagnoses:  Gastrostomy tube dysfunction [facility])     ED Discharge Orders     None        Note:  This document was prepared using Dragon voice recognition software and may include unintentional dictation errors.    Rada Hay, MD 04/20/21 [phone]

## 2021-04-20 NOTE — ED Provider Notes (Signed)
[facility] Emergency Department Provider Note  Time seen: 8:17 PM  I have reviewed the triage vital signs and the nursing notes.   HISTORY  Chief Complaint g-tube leaking   HPI Louis Stone is a 70 y.o. male with a past medical history of arthritis, diabetes, oropharyngeal cancer, presents to the emergency department for leaking G-tube.  Patient was seen in the emergency department earlier today for leaking G-tube which appeared to be clogged, clog was cleared and the patient was discharged home.  He states since going home he continued to have some leaking around the G-tube so he presents back to the emergency department for evaluation.  Patient wishes for the G-tube to be exchanged.  Patient denies any abdominal pain.  No fever.  No vomiting.   Past Medical History:  Diagnosis Date   Arthritis    per pt. I do not have md notes that indicate dx.   Diabetes mellitus without complication ([place])    Oropharynx cancer ([place]) 2009   squamous cell cancer  Stage T3, N2c, M0   Personal history of tobacco use, presenting hazards to health 10/08/2015    Patient Active Problem List   Diagnosis Date Noted   Aspiration pneumonia ([place]) 03/12/2021   Protein-calorie malnutrition, severe 02/02/2021   Acute respiratory failure with hypoxia ([place]) 02/01/2021   Squamous cell carcinoma of oropharynx ([place]) 02/01/2021   Acute renal failure (ARF) ([place]) 02/01/2021   Acute left-sided weakness 77/82/4235   Acute metabolic encephalopathy 36/14/4315   Mixed diabetic hyperlipidemia associated with type 2 diabetes mellitus ([place]) 07/16/2020   Uncontrolled type 2 diabetes mellitus with hypoglycemia without coma, with long-term current use of insulin ([place]) 07/16/2020   COVID-19 virus infection 07/16/2020   GERD without esophagitis 07/16/2020   Bradycardia 07/16/2020   Severe sepsis ([place]) 09/16/2016   Personal history of tobacco use, presenting hazards to health 10/08/2015    Past  Surgical History:  Procedure Laterality Date   IR GASTROSTOMY TUBE MOD SED  03/18/2021   IR MECH REMOV OBSTRUC MAT ANY COLON TUBE W/FLUORO  04/15/2021   IR [place] GASTRO/COLONIC TUBE PERCUT W/FLUORO  03/25/2021   THORACIC [place]   pt states ruptured disc and had surgery to repair it    Prior to Admission medications   Medication Sig Start Date End Date Taking? Authorizing Provider  ACCU-CHEK AVIVA PLUS test strip  09/04/20   [provider]  acetaminophen (TYLENOL) 325 MG tablet Take 650 mg by mouth every 6 (six) hours as needed for headache or mild pain.    [provider]  albuterol (PROVENTIL HFA;VENTOLIN HFA) 108 (90 Base) MCG/ACT inhaler Inhale 2 puffs into the lungs every 6 (six) hours as needed for wheezing.    [provider]  atorvastatin (LIPITOR) 20 MG tablet Place 1 tablet (20 mg total) into feeding tube daily. 03/21/21   Wouk, Ailene Rud, MD  famotidine (PEPCID) 40 MG tablet Place 1 tablet (40 mg total) into feeding tube daily. 03/21/21   Wouk, Ailene Rud, MD  lisinopril (ZESTRIL) 5 MG tablet Place 1 tablet (5 mg total) into feeding tube daily. 03/21/21   Wouk, Ailene Rud, MD  Nutritional Supplements (FEEDING SUPPLEMENT, JEVITY 1.5 CAL/FIBER,) LIQD Place 120 mLs into feeding tube 6 (six) times daily. 03/21/21   Wouk, Ailene Rud, MD  omeprazole (PRILOSEC) 40 MG capsule Take 40 mg by mouth daily. 06/06/20   [provider]  TOUJEO SOLOSTAR 300 UNIT/ML Solostar Pen Inject 15 Units into the skin  daily. 07/21/20   Terrilee Croak, MD  Water For Irrigation, Sterile (FREE WATER) SOLN Place 100 mLs into feeding tube 6 (six) times daily. 03/21/21   Wouk, Ailene Rud, MD    No Known Allergies  Family History  Problem Relation Age of Onset   CVA Father    Prostate cancer Neg Hx        not sure what family member    Social History Social History   Tobacco Use   Smoking status: Former    Packs/day: 1.50    Years: 40.00    Pack years:  60.00    Types: Cigarettes    Quit date: 07/05/2006    Years since quitting: 14.8   Smokeless tobacco: Never  Substance Use Topics   Alcohol use: No    Comment: quit in 1991   Drug use: No    Review of Systems Constitutional: Negative for fever. Cardiovascular: Negative for chest pain. Respiratory: Negative for shortness of breath. Gastrointestinal: Leaking G-tube. Musculoskeletal: Negative for musculoskeletal complaints Neurological: Negative for headache All other ROS negative  ____________________________________________   PHYSICAL EXAM:  VITAL SIGNS: ED Triage Vitals  Enc Vitals Group     BP 04/20/21 1919 (!) 139/91     Pulse Rate 04/20/21 1919 66     Resp 04/20/21 1919 16     Temp 04/20/21 1919 98.4 F (36.9 C)     Temp Source 04/20/21 1919 Oral     SpO2 04/20/21 1919 100 %     Weight 04/20/21 1923 129 lb 13.6 oz (58.9 kg)     Height 04/20/21 1923 5\' 3"  (1.6 m)     Head Circumference --      Peak Flow --      Pain Score 04/20/21 1923 0     Pain Loc --      Pain Edu? --      Excl. in [place]? --    Constitutional: Alert and oriented. Well appearing and in no distress. Eyes: Normal exam ENT      Head: Normocephalic and atraumatic.      Mouth/Throat: Mucous membranes are moist. Cardiovascular: Normal rate, regular rhythm.  Respiratory: Normal respiratory effort without tachypnea nor retractions. Breath sounds are clear  Gastrointestinal: Patient has a G-tube in place with a fairly significant amount of leakage surrounding the G-tube stoma Musculoskeletal: Nontender with normal range of motion in all extremities. Neurologic: No gross focal neurologic deficits  Skin:  Skin is warm, dry and intact.  Psychiatric: Mood and affect are normal  ____________________________________________   INITIAL IMPRESSION / ASSESSMENT AND PLAN / ED COURSE  Pertinent labs & imaging results that were available during my care of the patient were reviewed by me and considered in my  medical decision making (see chart for details).   Patient presents emergency department for G-tube leaking.  Patient does have leakage around the G-tube insertion site.  Patient has a benign/nontender abdomen.  G-tube was exchanged with a new G-tube.  Flushes well.  No apparent leakage.  We will discharge the patient home with PCP follow-up.  Patient agreeable to plan of care.  Louis Stone was evaluated in Emergency Department on 04/20/2021 for the symptoms described in the history of present illness. He was evaluated in the context of the global COVID-19 pandemic, which necessitated consideration that the patient might be at risk for infection with the SARS-CoV-2 virus that causes COVID-19. Institutional protocols and algorithms that pertain to the evaluation of patients at risk for COVID-19 are  in a state of rapid change based on information released by regulatory bodies including the CDC and federal and state organizations. These policies and algorithms were followed during the patient's care in the ED.  ____________________________________________   FINAL CLINICAL IMPRESSION(S) / ED DIAGNOSES  Leaking G-tube   Harvest Dark, MD 04/20/21 2019

## 2021-04-20 NOTE — ED Provider Notes (Signed)
Emergency Medicine Provider Triage Evaluation Note  Louis Stone , a 70 y.o. male  was evaluated in triage.  Pt complains of lesion feeding tube.  Patient states that feeding tube is clogged and working.  He denies chest pain, chest tightness and abdominal pain.  Review of Systems  Positive: Patient has issue with feeding tube.  Negative: No chest pain, chest tightness or abdominal pain.   Physical Exam  BP 113/76 (BP Location: Right Arm)   Pulse 69   Temp 98.2 F (36.8 C) (Oral)   Resp 17   Ht 5\' 3"  (1.6 m)   Wt 59 kg   SpO2 99%   BMI 23.03 kg/m  Gen:   Awake, no distress   Resp:  Normal effort  MSK:   Moves extremities without difficulty  Other:    Medical Decision Making  Medically screening exam initiated at 3:57 PM.  Appropriate orders placed.  Kinsley Hands was informed that the remainder of the evaluation will be completed by another provider, this initial triage assessment does not replace that evaluation, and the importance of remaining in the ED until their evaluation is complete.     Vallarie Mare [place], PA-C 04/20/21 1559    Lavonia Drafts, MD 04/20/21 [phone]

## 2021-04-21 ENCOUNTER — Other Ambulatory Visit: Payer: Self-pay | Admitting: Interventional Radiology

## 2021-05-27 ENCOUNTER — Ambulatory Visit: Payer: Self-pay

## 2021-05-27 NOTE — Telephone Encounter (Signed)
Pt. Reports he has been paying out of pocket for his feedings for his feeding tube. Asking who can order this for him. Gave him his PCP name and phone number. States he will call him.     Answer Assessment - Initial Assessment Questions 1. REASON FOR CALL or QUESTION: "What is your reason for calling today?" or "How can I best help you?" or "What question do you have that I can help answer?"     Pt. Asking who can help him get feedings for his feeding tube. 2. CALLER: Document the source of call. (e.g., laboratory, patient).     Patient  Protocols used: PCP Call - No Triage-A-AH

## 2021-06-05 ENCOUNTER — Emergency Department: Payer: Medicare Other

## 2021-06-05 ENCOUNTER — Inpatient Hospital Stay
Admission: EM | Admit: 2021-06-05 | Discharge: 2021-06-09 | DRG: 638 | Disposition: A | Discharge status: Home / Self Care | Payer: Medicare Other | Attending: Internal Medicine | Admitting: Internal Medicine

## 2021-06-05 ENCOUNTER — Other Ambulatory Visit: Payer: Self-pay

## 2021-06-05 DIAGNOSIS — E11649 Type 2 diabetes mellitus with hypoglycemia without coma: Secondary | ICD-10-CM | POA: Diagnosis not present

## 2021-06-05 DIAGNOSIS — T383X5A Adverse effect of insulin and oral hypoglycemic [antidiabetic] drugs, initial encounter: Secondary | ICD-10-CM

## 2021-06-05 DIAGNOSIS — Z6821 Body mass index (BMI) 21.0-21.9, adult: Secondary | ICD-10-CM

## 2021-06-05 DIAGNOSIS — Z794 Long term (current) use of insulin: Secondary | ICD-10-CM

## 2021-06-05 DIAGNOSIS — Z20822 Contact with and (suspected) exposure to covid-19: Secondary | ICD-10-CM | POA: Diagnosis present

## 2021-06-05 DIAGNOSIS — Z85818 Personal history of malignant neoplasm of other sites of lip, oral cavity, and pharynx: Secondary | ICD-10-CM

## 2021-06-05 DIAGNOSIS — E162 Hypoglycemia, unspecified: Secondary | ICD-10-CM

## 2021-06-05 DIAGNOSIS — E160 Drug-induced hypoglycemia without coma: Secondary | ICD-10-CM

## 2021-06-05 DIAGNOSIS — K9423 Gastrostomy malfunction: Secondary | ICD-10-CM

## 2021-06-05 DIAGNOSIS — J209 Acute bronchitis, unspecified: Secondary | ICD-10-CM | POA: Diagnosis present

## 2021-06-05 DIAGNOSIS — Z931 Gastrostomy status: Secondary | ICD-10-CM

## 2021-06-05 DIAGNOSIS — C109 Malignant neoplasm of oropharynx, unspecified: Secondary | ICD-10-CM | POA: Diagnosis present

## 2021-06-05 DIAGNOSIS — Z87891 Personal history of nicotine dependence: Secondary | ICD-10-CM

## 2021-06-05 DIAGNOSIS — Z823 Family history of stroke: Secondary | ICD-10-CM

## 2021-06-05 DIAGNOSIS — I499 Cardiac arrhythmia, unspecified: Secondary | ICD-10-CM | POA: Diagnosis present

## 2021-06-05 DIAGNOSIS — R062 Wheezing: Secondary | ICD-10-CM

## 2021-06-05 DIAGNOSIS — Z79899 Other long term (current) drug therapy: Secondary | ICD-10-CM

## 2021-06-05 DIAGNOSIS — I472 Ventricular tachycardia, unspecified: Secondary | ICD-10-CM | POA: Diagnosis present

## 2021-06-05 DIAGNOSIS — E46 Unspecified protein-calorie malnutrition: Secondary | ICD-10-CM | POA: Diagnosis present

## 2021-06-05 DIAGNOSIS — C760 Malignant neoplasm of head, face and neck: Secondary | ICD-10-CM

## 2021-06-05 LAB — COMPREHENSIVE METABOLIC PANEL
ALT: 39 U/L (ref 0–44)
AST: 36 U/L (ref 15–41)
Albumin: 3.9 g/dL (ref 3.5–5.0)
Alkaline Phosphatase: 80 U/L (ref 38–126)
Anion gap: 7 (ref 5–15)
BUN: 18 mg/dL (ref 8–23)
CO2: 29 mmol/L (ref 22–32)
Calcium: 9.1 mg/dL (ref 8.9–10.3)
Chloride: 97 mmol/L — ABNORMAL LOW (ref 98–111)
Creatinine, Ser: 0.84 mg/dL (ref 0.61–1.24)
GFR, Estimated: >60 mL/min (ref >60)
Glucose, Bld: 86 mg/dL (ref 70–99)
Potassium: 3.7 mmol/L (ref 3.5–5.1)
Sodium: 133 mmol/L — ABNORMAL LOW (ref 135–145)
Total Bilirubin: 0.4 mg/dL (ref 0.3–1.2)
Total Protein: 8.3 g/dL — ABNORMAL HIGH (ref 6.5–8.1)

## 2021-06-05 LAB — CBC WITH DIFFERENTIAL/PLATELET
Abs Immature Granulocytes: 0.02 10*3/uL (ref 0.00–0.07)
Basophils Absolute: 0 10*3/uL (ref 0.0–0.1)
Basophils Relative: 0 %
Eosinophils Absolute: 0 10*3/uL (ref 0.0–0.5)
Eosinophils Relative: 0 %
HCT: 38.7 % — ABNORMAL LOW (ref 39.0–52.0)
Hemoglobin: 12.2 g/dL — ABNORMAL LOW (ref 13.0–17.0)
Immature Granulocytes: 0 %
Lymphocytes Relative: 4 %
Lymphs Abs: 0.3 10*3/uL — ABNORMAL LOW (ref 0.7–4.0)
MCH: 26 pg (ref 26.0–34.0)
MCHC: 31.5 g/dL (ref 30.0–36.0)
MCV: 82.5 fL (ref 80.0–100.0)
Monocytes Absolute: 0.2 10*3/uL (ref 0.1–1.0)
Monocytes Relative: 3 %
Neutro Abs: 6.3 10*3/uL (ref 1.7–7.7)
Neutrophils Relative %: 93 %
Platelets: 235 10*3/uL (ref 150–400)
RBC: 4.69 MIL/uL (ref 4.22–5.81)
RDW: 13.8 % (ref 11.5–15.5)
WBC: 6.8 10*3/uL (ref 4.0–10.5)
nRBC: 0 % (ref 0.0–0.2)

## 2021-06-05 LAB — CBG MONITORING, ED
Glucose-Capillary: 98 mg/dL (ref 70–99)
Glucose-Capillary: 51 mg/dL — ABNORMAL LOW (ref 70–99)
Glucose-Capillary: 138 mg/dL — ABNORMAL HIGH (ref 70–99)

## 2021-06-05 LAB — TROPONIN I (HIGH SENSITIVITY): Troponin I (High Sensitivity): 10 ng/L (ref <18)

## 2021-06-05 MED ORDER — PANTOPRAZOLE SODIUM 40 MG PO TBEC
40.0000 mg | DELAYED_RELEASE_TABLET | Freq: Every day | ORAL | Status: DC
  Administered 2021-06-06 – 2021-06-09 (×4): 40 mg via ORAL
  Filled 2021-06-05 (×4): qty 1

## 2021-06-05 MED ORDER — JEVITY 1.5 CAL/FIBER PO LIQD: 120.0000 mL | Freq: Once | ORAL | Status: DC

## 2021-06-05 MED ORDER — ACETAMINOPHEN 325 MG PO TABS
650.0000 mg | ORAL_TABLET | Freq: Four times a day (QID) | ORAL | Status: DC | PRN
  Administered 2021-06-07: 09:00:00 650 mg via ORAL
  Filled 2021-06-05: qty 2

## 2021-06-05 MED ORDER — ACETAMINOPHEN 650 MG RE SUPP
650.0000 mg | Freq: Four times a day (QID) | RECTAL | Status: DC | PRN
  Filled 2021-06-05: qty 1

## 2021-06-05 MED ORDER — ONDANSETRON HCL 4 MG PO TABS: 4.0000 mg | ORAL_TABLET | Freq: Four times a day (QID) | ORAL | Status: DC | PRN

## 2021-06-05 MED ORDER — ATORVASTATIN CALCIUM 20 MG PO TABS
20.0000 mg | ORAL_TABLET | Freq: Every day | ORAL | Status: DC
  Administered 2021-06-06 – 2021-06-09 (×4): 20 mg
  Filled 2021-06-05 (×4): qty 1

## 2021-06-05 MED ORDER — DIATRIZOATE MEGLUMINE & SODIUM 66-10 % PO SOLN
30.0000 mL | Freq: Once | ORAL | Status: AC
  Administered 2021-06-05: 30 mL

## 2021-06-05 MED ORDER — JEVITY 1.5 CAL/FIBER PO LIQD: 1000.0000 mL | ORAL | Status: DC

## 2021-06-05 MED ORDER — OSMOLITE 1.2 CAL PO LIQD: 1000.0000 mL | ORAL | Status: DC

## 2021-06-05 MED ORDER — DEXTROSE 50 % IV SOLN
50.0000 mL | Freq: Once | INTRAVENOUS | Status: AC
  Administered 2021-06-05: 50 mL via INTRAVENOUS
  Filled 2021-06-05: qty 50

## 2021-06-05 MED ORDER — LIDOCAINE HCL URETHRAL/MUCOSAL 2 % EX GEL
1.0000 "application " | Freq: Once | CUTANEOUS | Status: AC
  Administered 2021-06-05: 1 via TOPICAL
  Filled 2021-06-05: qty 10

## 2021-06-05 MED ORDER — ONDANSETRON HCL 4 MG/2ML IJ SOLN: 4.0000 mg | Freq: Four times a day (QID) | INTRAMUSCULAR | Status: DC | PRN

## 2021-06-05 MED ORDER — LISINOPRIL 5 MG PO TABS
5.0000 mg | ORAL_TABLET | Freq: Every day | ORAL | Status: DC
  Administered 2021-06-06: 5 mg
  Filled 2021-06-05: qty 1

## 2021-06-05 MED ORDER — ENOXAPARIN SODIUM 40 MG/0.4ML IJ SOSY
40.0000 mg | PREFILLED_SYRINGE | INTRAMUSCULAR | Status: DC
  Administered 2021-06-07 – 2021-06-08 (×3): 40 mg via SUBCUTANEOUS
  Filled 2021-06-05 (×3): qty 0.4

## 2021-06-05 NOTE — ED Notes (Signed)
Tube feeding requested from nutrition.
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
## 2021-06-05 NOTE — ED Notes (Signed)
Patient transported to CT 

## 2021-06-05 NOTE — ED Notes (Signed)
Pt BIB EMS for hypoglycemia. Pt A&O x4 on arrival to ER.

## 2021-06-06 DIAGNOSIS — E46 Unspecified protein-calorie malnutrition: Secondary | ICD-10-CM | POA: Diagnosis present

## 2021-06-06 DIAGNOSIS — Z85818 Personal history of malignant neoplasm of other sites of lip, oral cavity, and pharynx: Secondary | ICD-10-CM | POA: Diagnosis not present

## 2021-06-06 DIAGNOSIS — Z823 Family history of stroke: Secondary | ICD-10-CM | POA: Diagnosis not present

## 2021-06-06 DIAGNOSIS — I499 Cardiac arrhythmia, unspecified: Secondary | ICD-10-CM | POA: Diagnosis present

## 2021-06-06 DIAGNOSIS — Z87891 Personal history of nicotine dependence: Secondary | ICD-10-CM | POA: Diagnosis not present

## 2021-06-06 DIAGNOSIS — Z79899 Other long term (current) drug therapy: Secondary | ICD-10-CM | POA: Diagnosis not present

## 2021-06-06 DIAGNOSIS — E160 Drug-induced hypoglycemia without coma: Secondary | ICD-10-CM | POA: Diagnosis not present

## 2021-06-06 DIAGNOSIS — E11649 Type 2 diabetes mellitus with hypoglycemia without coma: Secondary | ICD-10-CM | POA: Diagnosis present

## 2021-06-06 DIAGNOSIS — Z794 Long term (current) use of insulin: Secondary | ICD-10-CM | POA: Diagnosis not present

## 2021-06-06 DIAGNOSIS — Z20822 Contact with and (suspected) exposure to covid-19: Secondary | ICD-10-CM | POA: Diagnosis present

## 2021-06-06 DIAGNOSIS — T383X5A Adverse effect of insulin and oral hypoglycemic [antidiabetic] drugs, initial encounter: Secondary | ICD-10-CM | POA: Diagnosis present

## 2021-06-06 DIAGNOSIS — K9423 Gastrostomy malfunction: Secondary | ICD-10-CM | POA: Diagnosis present

## 2021-06-06 DIAGNOSIS — J209 Acute bronchitis, unspecified: Secondary | ICD-10-CM | POA: Diagnosis present

## 2021-06-06 DIAGNOSIS — Z6821 Body mass index (BMI) 21.0-21.9, adult: Secondary | ICD-10-CM | POA: Diagnosis not present

## 2021-06-06 DIAGNOSIS — I472 Ventricular tachycardia, unspecified: Secondary | ICD-10-CM | POA: Diagnosis present

## 2021-06-06 LAB — GLUCOSE, CAPILLARY
Glucose-Capillary: 120 mg/dL — ABNORMAL HIGH (ref 70–99)
Glucose-Capillary: 169 mg/dL — ABNORMAL HIGH (ref 70–99)
Glucose-Capillary: 250 mg/dL — ABNORMAL HIGH (ref 70–99)

## 2021-06-06 LAB — MAGNESIUM: Magnesium: 2.5 mg/dL — ABNORMAL HIGH (ref 1.7–2.4)

## 2021-06-06 LAB — RESP PANEL BY RT-PCR (FLU A&B, COVID) ARPGX2
Influenza A by PCR: NEGATIVE
Influenza B by PCR: NEGATIVE
SARS Coronavirus 2 by RT PCR: NEGATIVE

## 2021-06-06 LAB — CBG MONITORING, ED
Glucose-Capillary: 163 mg/dL — ABNORMAL HIGH (ref 70–99)
Glucose-Capillary: 27 mg/dL — CL (ref 70–99)
Glucose-Capillary: 158 mg/dL — ABNORMAL HIGH (ref 70–99)
Glucose-Capillary: 195 mg/dL — ABNORMAL HIGH (ref 70–99)
Glucose-Capillary: 226 mg/dL — ABNORMAL HIGH (ref 70–99)
Glucose-Capillary: 167 mg/dL — ABNORMAL HIGH (ref 70–99)

## 2021-06-06 MED ORDER — POTASSIUM CHLORIDE 20 MEQ PO PACK
40.0000 meq | PACK | Freq: Once | ORAL | Status: DC
  Filled 2021-06-06: qty 2

## 2021-06-06 MED ORDER — DEXTROSE 10 % IV SOLN: INTRAVENOUS | Status: DC

## 2021-06-06 MED ORDER — ALBUTEROL SULFATE (2.5 MG/3ML) 0.083% IN NEBU: 3.0000 mL | INHALATION_SOLUTION | Freq: Four times a day (QID) | RESPIRATORY_TRACT | Status: DC | PRN

## 2021-06-06 MED ORDER — FREE WATER
100.0000 mL | Freq: Every day | Status: DC
  Administered 2021-06-06 – 2021-06-07 (×8): 100 mL
  Filled 2021-06-06 (×6): qty 100

## 2021-06-06 MED ORDER — METOPROLOL SUCCINATE ER 25 MG PO TB24
12.5000 mg | ORAL_TABLET | Freq: Every day | ORAL | Status: DC
  Administered 2021-06-06 – 2021-06-09 (×3): 12.5 mg via ORAL
  Filled 2021-06-06: qty 0.5
  Filled 2021-06-06 (×3): qty 1

## 2021-06-06 MED ORDER — DEXTROSE 50 % IV SOLN
INTRAVENOUS | Status: AC
  Filled 2021-06-06: qty 50

## 2021-06-06 MED ORDER — ENSURE ENLIVE PO LIQD
2.0000 | Freq: Three times a day (TID) | ORAL | Status: DC
  Administered 2021-06-06 – 2021-06-07 (×4): 474 mL via ORAL

## 2021-06-06 NOTE — ED Notes (Signed)
Pt ecg shown 8 beat run of Vtach on strip. Md messaged

## 2021-06-06 NOTE — ED Notes (Signed)
Pt given 2 ensure bottled for tube feed. Pt refused assistance at this time

## 2021-06-06 NOTE — ED Notes (Signed)
Pt given 2x ensure to self administer in G-Tube, MD made aware.

## 2021-06-06 NOTE — ED Notes (Signed)
This RN to pt's room at this time. Monitor alarming, pt had 8 beat run of Vtach. Pt is A&Ox4 and NAD. Printed findings and notified Gregary Signs, primary RN.

## 2021-06-06 NOTE — ED Notes (Signed)
MD Swayze messaged about pt 8 beat run on vtach.

## 2021-06-06 NOTE — ED Notes (Signed)
Called dietary about pt  ensure. "Will be delivered"

## 2021-06-06 NOTE — ED Notes (Signed)
Messaged attending about ptn CBG, d10 order and patient wanting to be dc'd home
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
## 2021-06-07 DIAGNOSIS — E160 Drug-induced hypoglycemia without coma: Secondary | ICD-10-CM | POA: Diagnosis not present

## 2021-06-07 DIAGNOSIS — T383X5A Adverse effect of insulin and oral hypoglycemic [antidiabetic] drugs, initial encounter: Secondary | ICD-10-CM | POA: Diagnosis not present

## 2021-06-07 LAB — CBC WITH DIFFERENTIAL/PLATELET
Abs Immature Granulocytes: 0.01 10*3/uL (ref 0.00–0.07)
Basophils Absolute: 0 10*3/uL (ref 0.0–0.1)
Basophils Relative: 1 %
Eosinophils Absolute: 0.1 10*3/uL (ref 0.0–0.5)
Eosinophils Relative: 2 %
HCT: 37.7 % — ABNORMAL LOW (ref 39.0–52.0)
Hemoglobin: 12.2 g/dL — ABNORMAL LOW (ref 13.0–17.0)
Immature Granulocytes: 0 %
Lymphocytes Relative: 28 %
Lymphs Abs: 1.5 10*3/uL (ref 0.7–4.0)
MCH: 26.1 pg (ref 26.0–34.0)
MCHC: 32.4 g/dL (ref 30.0–36.0)
MCV: 80.7 fL (ref 80.0–100.0)
Monocytes Absolute: 0.6 10*3/uL (ref 0.1–1.0)
Monocytes Relative: 10 %
Neutro Abs: 3.2 10*3/uL (ref 1.7–7.7)
Neutrophils Relative %: 59 %
Platelets: 249 10*3/uL (ref 150–400)
RBC: 4.67 MIL/uL (ref 4.22–5.81)
RDW: 13.9 % (ref 11.5–15.5)
WBC: 5.5 10*3/uL (ref 4.0–10.5)
nRBC: 0 % (ref 0.0–0.2)

## 2021-06-07 LAB — COMPREHENSIVE METABOLIC PANEL
ALT: 29 U/L (ref 0–44)
AST: 23 U/L (ref 15–41)
Albumin: 3.3 g/dL — ABNORMAL LOW (ref 3.5–5.0)
Alkaline Phosphatase: 68 U/L (ref 38–126)
Anion gap: 6 (ref 5–15)
BUN: 23 mg/dL (ref 8–23)
CO2: 29 mmol/L (ref 22–32)
Calcium: 8.9 mg/dL (ref 8.9–10.3)
Chloride: 99 mmol/L (ref 98–111)
Creatinine, Ser: 0.81 mg/dL (ref 0.61–1.24)
GFR, Estimated: >60 mL/min (ref >60)
Glucose, Bld: 33 mg/dL — CL (ref 70–99)
Potassium: 3.8 mmol/L (ref 3.5–5.1)
Sodium: 134 mmol/L — ABNORMAL LOW (ref 135–145)
Total Bilirubin: 0.4 mg/dL (ref 0.3–1.2)
Total Protein: 7.2 g/dL (ref 6.5–8.1)

## 2021-06-07 LAB — GLUCOSE, CAPILLARY
Glucose-Capillary: 32 mg/dL — CL (ref 70–99)
Glucose-Capillary: 216 mg/dL — ABNORMAL HIGH (ref 70–99)
Glucose-Capillary: 140 mg/dL — ABNORMAL HIGH (ref 70–99)
Glucose-Capillary: 253 mg/dL — ABNORMAL HIGH (ref 70–99)
Glucose-Capillary: 176 mg/dL — ABNORMAL HIGH (ref 70–99)
Glucose-Capillary: 195 mg/dL — ABNORMAL HIGH (ref 70–99)

## 2021-06-07 LAB — MAGNESIUM: Magnesium: 2.1 mg/dL (ref 1.7–2.4)

## 2021-06-07 MED ORDER — JEVITY 1.5 CAL/FIBER PO LIQD
237.0000 mL | Freq: Every day | ORAL | Status: DC
  Administered 2021-06-07 – 2021-06-09 (×10): 237 mL

## 2021-06-07 MED ORDER — DEXTROSE 10 % IV SOLN
INTRAVENOUS | Status: DC
Start: 2021-06-07 — End: 2021-06-08

## 2021-06-07 MED ORDER — COSYNTROPIN 0.25 MG IJ SOLR
0.2500 mg | Freq: Once | INTRAMUSCULAR | Status: AC
  Administered 2021-06-08: 0.25 mg via INTRAVENOUS
  Filled 2021-06-07 (×3): qty 0.25

## 2021-06-07 MED ORDER — PERMETHRIN 1 % EX LOTN
TOPICAL_LOTION | Freq: Once | CUTANEOUS | Status: AC
  Filled 2021-06-07: qty 59

## 2021-06-07 MED ORDER — DEXTROSE 50 % IV SOLN: 25.0000 g | INTRAVENOUS | Status: DC

## 2021-06-07 MED ORDER — DEXTROSE 50 % IV SOLN
INTRAVENOUS | Status: AC
  Filled 2021-06-07: qty 50

## 2021-06-07 MED ORDER — PERMETHRIN 5 % EX CREA
TOPICAL_CREAM | Freq: Once | CUTANEOUS | Status: AC
  Filled 2021-06-07: qty 60

## 2021-06-07 MED ORDER — FREE WATER
100.0000 mL | Freq: Every day | Status: DC
  Administered 2021-06-07 – 2021-06-09 (×10): 100 mL
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
## 2021-06-07 NOTE — Plan of Care (Signed)

## 2021-06-07 NOTE — Progress Notes (Signed)
Notified of blood sugar of 32. Amp of D50 given. Blood sugar rechecked. [place]

## 2021-06-07 NOTE — Progress Notes (Signed)
Initial Nutrition Assessment  DOCUMENTATION CODES:   Not applicable  INTERVENTION:   Jevity 1.5- Give 6 cartons daily via tube- Flush with 60ml of water before and after each feed.    Regimen provides 2130kcal/day, 91g/day protein and 1680ml/day free water.   Pt at high refeed risk; recommend monitor potassium, magnesium and phosphorus labs daily until stable  NUTRITION DIAGNOSIS:   Inadequate oral intake related to dysphagia as evidenced by NPO status (pt NPO with chronic G-tube).  GOAL:   Patient will meet greater than or equal to 90% of their needs  MONITOR:   Labs, Weight trends, TF tolerance, Skin, I & O's  REASON FOR ASSESSMENT:   Consult Enteral/tube feeding initiation and management  ASSESSMENT:   70 y.o. 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  RD working remotely.  Spoke with pt via phone. RD familiar with this pt from a recent previous admit. Pt s/p G-tube placement in September 2022 r/t dysphagia; this was replaced in the ED r/t malfunction. Pt has not followed with any Dietitian since discharge. Pt reports that he stopped receiving tube feed supplies to his home over 2 months ago so he has been buying regular Ensure and using this for his nutrition source. Pt reports giving himself 6 regular Ensures daily along with 4 syringes of water randomly throughout the day. Per chart, pt has lost 13lbs(9%) since having his tube placed in September; this is significant weight loss. Regular Ensure does not meet pt's nutritional needs. RD will change pt back to standard formula of Jevity 1.5. Will consult for TOC to help arrange for tube feeding supplies to be sent to pt's home again. Pt is at high refeed risk. Pt reports that he does not eat anything by mouth. Pt previously diagnosed with severe malnutrition; RD suspects pt  continues to be malnourished. Will obtain nutrition related exam at follow up.    Medications reviewed and include: lovenox, protonix, KCl, 10% dextrose @50ml /hr   Labs reviewed: Na 134(L), K 3.8 wnl, Mg 2.1 wnl Cbgs- 176, 253, 140, 216 x 24 hrs AIC 6.8(H)- 8/1  NUTRITION - FOCUSED PHYSICAL EXAM: Unable to perform at this time   Diet Order:   Diet Order     None      EDUCATION NEEDS:   Education needs have been addressed  Skin:  Skin Assessment: Reviewed RN Assessment  Last BM:  12/3  Height:   Ht Readings from Last 1 Encounters:  06/05/21 5\' 5"  (1.651 m)    Weight:   Wt Readings from Last 1 Encounters:  06/05/21 57.6 kg    Ideal Body Weight:  61.8 kg  BMI:  Body mass index is 21.13 kg/m.  Estimated Nutritional Needs:   Kcal:  1800-2100kcal/day  Protein:  90-105g/day  Fluid:  1.5-1.7L/day  Koleen Distance MS, RD, LDN Please refer to [facility] for RD and/or RD on-call/weekend/after hours pager

## 2021-06-08 DIAGNOSIS — E160 Drug-induced hypoglycemia without coma: Secondary | ICD-10-CM | POA: Diagnosis not present

## 2021-06-08 DIAGNOSIS — T383X5A Adverse effect of insulin and oral hypoglycemic [antidiabetic] drugs, initial encounter: Secondary | ICD-10-CM | POA: Diagnosis not present

## 2021-06-08 LAB — GLUCOSE, CAPILLARY
Glucose-Capillary: 213 mg/dL — ABNORMAL HIGH (ref 70–99)
Glucose-Capillary: 257 mg/dL — ABNORMAL HIGH (ref 70–99)
Glucose-Capillary: 282 mg/dL — ABNORMAL HIGH (ref 70–99)
Glucose-Capillary: 328 mg/dL — ABNORMAL HIGH (ref 70–99)
Glucose-Capillary: 378 mg/dL — ABNORMAL HIGH (ref 70–99)
Glucose-Capillary: 439 mg/dL — ABNORMAL HIGH (ref 70–99)

## 2021-06-08 LAB — ACTH STIMULATION, 3 TIME POINTS
Cortisol, 30 Min: 37.7 ug/dL
Cortisol, 60 Min: 48.7 ug/dL
Cortisol, Base: 16.3 ug/dL

## 2021-06-08 LAB — TROPONIN I (HIGH SENSITIVITY): Troponin I (High Sensitivity): 12 ng/L (ref <18)

## 2021-06-08 LAB — PHOSPHORUS: Phosphorus: 3.8 mg/dL (ref 2.5–4.6)

## 2021-06-08 LAB — MAGNESIUM: Magnesium: 2.1 mg/dL (ref 1.7–2.4)

## 2021-06-08 MED ORDER — INSULIN ASPART 100 UNIT/ML IJ SOLN
5.0000 [IU] | Freq: Once | INTRAMUSCULAR | Status: AC
Start: 2021-06-08 — End: 2021-06-08
  Administered 2021-06-08: 5 [IU] via SUBCUTANEOUS
  Filled 2021-06-08: qty 1

## 2021-06-08 NOTE — Progress Notes (Addendum)
Nutrition Follow-up  DOCUMENTATION CODES:   Severe malnutrition in context of chronic illness  INTERVENTION:   237 ml (1 carton ARC) Jevity 1.5 6 times daily via PEG  50 ml free water flush before and after each feeding administration  TF regimen to provides 2130 kcals, 91 grams protein, and 1080 ml free water daily. Total free water: 1680 daily  NUTRITION DIAGNOSIS:   Severe Malnutrition related to chronic illness (head and neck cancer) as evidenced by moderate fat depletion, severe fat depletion, moderate muscle depletion, severe muscle depletion, percent weight loss.  Ongoing  GOAL:   Patient will meet greater than or equal to 90% of their needs  Met with TF  MONITOR:   Labs, Weight trends, TF tolerance, Skin, I & O's  REASON FOR ASSESSMENT:   Consult Enteral/tube feeding initiation and management  ASSESSMENT:   70 y.o. 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  Reviewed I/O's: -1.2 L x 24 hours   UOP: 2 L x 24 hours   Spoke with pt at bedside, who reports feeling better today. He confirms history from previous RD regarding having to use Ensure for TF at home as he did not receive his TF supplies at his home for 2 months PTA.   Pt confirms he does not consume anything by mount and nutrition is solely via g-tube. He denies any difficulty tolerating Jevity 1.5 formula and denies any abdominal pain/ distention, nausea, or vomiting.   Per pt, he has lost "a ton" of weight. He reports his UBW is around 155-160# and estimated he has lost about 30-40#, but is unsure of what time frame ("it was a long time ago"). Reviewed wt hx; pt has experienced a 13.5% wt loss over the past 3 months, which is significant for time frame.   Reviewed current TF regimen with pt. He has no further questions regarding nutrition plan for care,  but expressed appreciation for visit.   Medications reviewed and include dextrose 10% infusion and KCl.   Labs reviewed: CBGS: 347-425 (inpatient orders for glycemic control are none).    NUTRITION - FOCUSED PHYSICAL EXAM:  Flowsheet Row Most Recent Value  Orbital Region Moderate depletion  Upper Arm Region Severe depletion  Thoracic and Lumbar Region Severe depletion  Buccal Region Mild depletion  Temple Region Moderate depletion  Clavicle Bone Region Severe depletion  Clavicle and Acromion Bone Region Severe depletion  Scapular Bone Region Severe depletion  Dorsal Hand Moderate depletion  Patellar Region Severe depletion  Anterior Thigh Region Severe depletion  Posterior Calf Region Severe depletion  Edema (RD Assessment) None  Hair Reviewed  Eyes Reviewed  Mouth Reviewed  Skin Reviewed  Nails Reviewed       Diet Order:   Diet Order     None       EDUCATION NEEDS:   Education needs have been addressed  Skin:  Skin Assessment: Reviewed RN Assessment  Last BM:  06/06/21  Height:   Ht Readings from Last 1 Encounters:  06/05/21 _0  (1.651 m)    Weight:   Wt Readings from Last 1 Encounters:  06/08/21 55 kg    Ideal Body Weight:  61.8 kg  BMI:  Body mass index is 20.17 kg/m.  Estimated Nutritional Needs:   Kcal:  1900-2100  Protein:  90-110 grams  Fluid:  > 1.9 L  Loistine Chance, RD, LDN, [place] Registered Dietitian II Certified Diabetes Care and Education Specialist Please refer to [facility] for RD and/or RD on-call/weekend/after hours pager
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
## 2021-06-08 NOTE — Progress Notes (Signed)
Inpatient Diabetes Program Recommendations  AACE/ADA: New Consensus Statement on Inpatient Glycemic Control   Target Ranges:  Prepandial:   less than 140 mg/dL      Peak postprandial:   less than 180 mg/dL (1-2 hours)      Critically ill patients:  140 - 180 mg/dL    Latest Reference Range & Units 06/08/21 00:08 06/08/21 04:20 06/08/21 08:30 06/08/21 12:25  Glucose-Capillary 70 - 99 mg/dL 282 (H) 257 (H) 213 (H) 378 (H)    Latest Reference Range & Units 06/07/21 06:30  Glucose 70 - 99 mg/dL 33 (LL)   Review of Glycemic Control  Diabetes history: DM2 Outpatient Diabetes medications: Toujeo 15 units daily Current orders for Inpatient glycemic control: Non; CBG monitoring  Inpatient Diabetes Program Recommendations:    Insulin: Please consider ordering Novolog 0-9 units Q4H.  Dextrose: Noted patient is ordered D10 @ 50 ml/hr and Jevity 237 ml 6 times a day. Please re-evaluate if D10 is still needed.  Thanks, Barnie Alderman, RN, MSN, CDE Diabetes Coordinator Inpatient Diabetes Program [phone] (Team Pager from 8am to 5pm)

## 2021-06-09 ENCOUNTER — Inpatient Hospital Stay: Payer: Medicare Other

## 2021-06-09 DIAGNOSIS — T383X5A Adverse effect of insulin and oral hypoglycemic [antidiabetic] drugs, initial encounter: Secondary | ICD-10-CM | POA: Diagnosis not present

## 2021-06-09 DIAGNOSIS — E160 Drug-induced hypoglycemia without coma: Secondary | ICD-10-CM | POA: Diagnosis not present

## 2021-06-09 LAB — CBC WITH DIFFERENTIAL/PLATELET
Abs Immature Granulocytes: 0.01 10*3/uL (ref 0.00–0.07)
Basophils Absolute: 0 10*3/uL (ref 0.0–0.1)
Basophils Relative: 1 %
Eosinophils Absolute: 0.1 10*3/uL (ref 0.0–0.5)
Eosinophils Relative: 3 %
HCT: 36 % — ABNORMAL LOW (ref 39.0–52.0)
Hemoglobin: 11.5 g/dL — ABNORMAL LOW (ref 13.0–17.0)
Immature Granulocytes: 0 %
Lymphocytes Relative: 37 %
Lymphs Abs: 1.2 10*3/uL (ref 0.7–4.0)
MCH: 26.1 pg (ref 26.0–34.0)
MCHC: 31.9 g/dL (ref 30.0–36.0)
MCV: 81.8 fL (ref 80.0–100.0)
Monocytes Absolute: 0.5 10*3/uL (ref 0.1–1.0)
Monocytes Relative: 16 %
Neutro Abs: 1.4 10*3/uL — ABNORMAL LOW (ref 1.7–7.7)
Neutrophils Relative %: 43 %
Platelets: 232 10*3/uL (ref 150–400)
RBC: 4.4 MIL/uL (ref 4.22–5.81)
RDW: 13.5 % (ref 11.5–15.5)
WBC: 3.2 10*3/uL — ABNORMAL LOW (ref 4.0–10.5)
nRBC: 0 % (ref 0.0–0.2)

## 2021-06-09 LAB — GLUCOSE, CAPILLARY
Glucose-Capillary: 222 mg/dL — ABNORMAL HIGH (ref 70–99)
Glucose-Capillary: 309 mg/dL — ABNORMAL HIGH (ref 70–99)
Glucose-Capillary: 330 mg/dL — ABNORMAL HIGH (ref 70–99)
Glucose-Capillary: 97 mg/dL (ref 70–99)

## 2021-06-09 LAB — BASIC METABOLIC PANEL
Anion gap: 8 (ref 5–15)
BUN: 16 mg/dL (ref 8–23)
CO2: 31 mmol/L (ref 22–32)
Calcium: 9.1 mg/dL (ref 8.9–10.3)
Chloride: 97 mmol/L — ABNORMAL LOW (ref 98–111)
Creatinine, Ser: 0.68 mg/dL (ref 0.61–1.24)
GFR, Estimated: >60 mL/min (ref >60)
Glucose, Bld: 72 mg/dL (ref 70–99)
Potassium: 3.7 mmol/L (ref 3.5–5.1)
Sodium: 136 mmol/L (ref 135–145)

## 2021-06-09 MED ORDER — JEVITY 1.5 CAL/FIBER PO LIQD: 237.0000 mL | Freq: Every day | ORAL | 0 refills | Status: AC

## 2021-06-09 MED ORDER — GUAIFENESIN ER 600 MG PO TB12: 1200.0000 mg | ORAL_TABLET | Freq: Two times a day (BID) | ORAL | 2 refills | Status: AC

## 2021-06-09 MED ORDER — ALBUTEROL SULFATE (2.5 MG/3ML) 0.083% IN NEBU
3.0000 mL | INHALATION_SOLUTION | Freq: Four times a day (QID) | RESPIRATORY_TRACT | 12 refills | Status: AC | PRN
Start: 2021-06-09 — End: ?

## 2021-06-09 MED ORDER — INSULIN ASPART 100 UNIT/ML IJ SOLN
5.0000 [IU] | Freq: Once | INTRAMUSCULAR | Status: AC
  Administered 2021-06-09: 5 [IU] via SUBCUTANEOUS
  Filled 2021-06-09: qty 1

## 2021-06-09 MED ORDER — METOPROLOL SUCCINATE ER 25 MG PO TB24
12.5000 mg | ORAL_TABLET | Freq: Every day | ORAL | 0 refills | Status: DC
Start: 2021-06-10 — End: 2021-07-05

## 2021-06-09 MED ORDER — AZITHROMYCIN 250 MG PO TABS: ORAL_TABLET | ORAL | 0 refills | Status: AC

## 2021-06-09 NOTE — TOC Transition Note (Signed)
Transition of Care [facility]) - CM/SW Discharge Note   Patient Details  Name: Louis Stone MRN: [id number] Date of Birth: 1951/03/15  [facility] CM/SW Contact:  Alberteen Sam, LCSW Phone Number: 06/09/2021, 1:45 PM   Clinical Narrative:     Patient to discharge home today. CSW notes that per RD patient has not had adequate tube feed supplies and reported it was stopped by DME agency.   CSW spoke with Thedore Mins with [employer] who reports patient may not have called in for more supplies but they will overnight him supplies today.   RD made aware and will supply patient with 24 hour worth of supplies today at discharge until [employer]'s supply arrives tomorrow.   No further discharge needs identified at this time.      Barriers to Discharge: No Barriers Identified   Patient Goals and CMS Choice Patient states their goals for this hospitalization and ongoing recovery are:: to go home CMS Medicare.gov Compare Post Acute Care list provided to:: Patient Choice offered to / list presented to : Patient  Discharge Placement                       Discharge Plan and Services                                     Social Determinants of Health (SDOH) Interventions     Readmission Risk Interventions Readmission Risk Prevention Plan 02/03/2021  Transportation Screening Complete  PCP or Specialist Appt within 3-5 Days Complete  HRI or [place] Complete  Social Work Consult for [place] Planning/Counseling Complete  Palliative Care Screening Not Applicable  Medication Review Press photographer) Complete  Some recent data might be hidden
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
## 2021-06-09 NOTE — Progress Notes (Signed)
Inpatient Diabetes Program Recommendations  AACE/ADA: New Consensus Statement on Inpatient Glycemic Control   Target Ranges:  Prepandial:   less than 140 mg/dL      Peak postprandial:   less than 180 mg/dL (1-2 hours)      Critically ill patients:  140 - 180 mg/dL    Latest Reference Range & Units 06/09/21 00:11 06/09/21 04:09 06/09/21 08:09 06/09/21 11:15  Glucose-Capillary 70 - 99 mg/dL 330 (H) 97 222 (H) 309 (H)    Latest Reference Range & Units 06/08/21 00:08 06/08/21 04:20 06/08/21 08:30 06/08/21 12:25 06/08/21 17:53 06/08/21 20:16  Glucose-Capillary 70 - 99 mg/dL 282 (H) 257 (H) 213 (H) 378 (H) 328 (H) 439 (H)    Latest Reference Range & Units 06/07/21 06:30  Glucose 70 - 99 mg/dL 33 (LL)    Review of Glycemic Control   Diabetes history: DM2 Outpatient Diabetes medications: Toujeo 15 units daily Current orders for Inpatient glycemic control: None; CBG monitoring; Jevity 237 ml 6 times daily   Inpatient Diabetes Program Recommendations:     Insulin: Please consider ordering Novolog 0-9 units Q4H.  Thanks, Barnie Alderman, RN, MSN, CDE Diabetes Coordinator Inpatient Diabetes Program [phone] (Team Pager from 8am to 5pm)

## 2021-06-09 NOTE — Care Management Important Message (Signed)
Important Message  Patient Details  Name: Yug Loria MRN: [id number] Date of Birth: [birth year]   Medicare Important Message Given:  N/A - LOS <3 / Initial given by admissions     Dannette Barbara 06/09/2021, 8:43 AM

## 2021-06-09 NOTE — Discharge Instructions (Signed)
Please make sure you contact [facility] every month for your tube feeding supplies.   When you have a week's supply left, call [place] [facility] [phone] option [phone] for enteral services) to order additional supplies.

## 2021-06-09 NOTE — Progress Notes (Signed)
Brief Nutrition Follow-Up Note  Case discussed with Jennet Maduro, RD at [facility] at [facility]. She shares that pt is no longer on active caseload as pt has completed treatments and is followed by cancer center for surveillance only. Once pt completes treatment at cancer center, care is transitioned to RD at home health agency. RD confirmed that pt was active with [facility].   Case discussed with [facility] team; discussed concern about pt being unable to receive TF PTA. TOC contacted [place]; supplies will be overnighted to pt's home and RD provided pt with 24 hour supply of TF formula.   Spoke with pt at bedside. Pt confirmed that he was unable to access TF supplies PTA and was supplementing with Ensure, however, did not provide RD with further details despite probing. RD informed him of plan to overnight supplies to home and provided pt with 24 hour supply of Jevity 1.5. RD also discussed with pt importance of calling [facility] monthly to re-order tube feeding supplies. RD provided number to [place] to pt. He stated understanding and had no further questions. Pt is eager to discharge home today.   Plan of care communicated with TOC and bedside RN.   Loistine Chance, RD, LDN, [place] Registered Dietitian II Certified Diabetes Care and Education Specialist Please refer to [facility] for RD and/or RD on-call/weekend/after hours pager

## 2021-06-17 IMAGING — CT CT ABD-PELV W/ CM
2 of 5 series · 16 of 46 positions shown, 18 images · IV contrast (APPLIED)
Comparison: CT chest 10/08/2015

CLINICAL DATA: Abdominal pain and fever.  Vomiting.

EXAM:
CT ABDOMEN AND PELVIS WITH CONTRAST
TECHNIQUE: Multidetector CT imaging of the abdomen and pelvis was performed
using the standard protocol following bolus administration of
intravenous contrast.
CONTRAST:  100mL OMNIPAQUE IOHEXOL 300 MG/ML  SOLN

[Series 2: routine abd/pel with · axial · 0.63mm/px · z∈[-943,-568]mm · 13 of 85 slices shown, 15 images]
[im 5/85  soft-tissue]
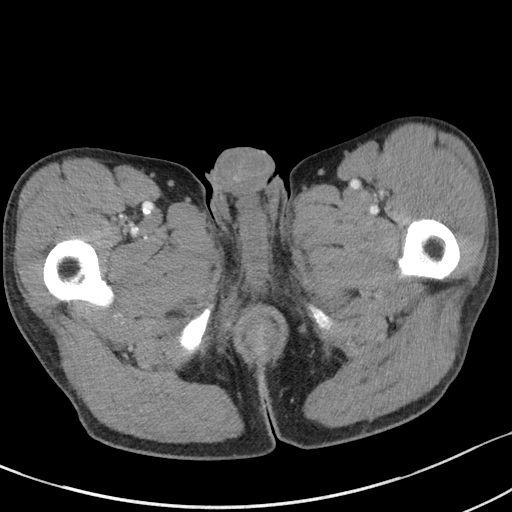
[im 5/85  bone]
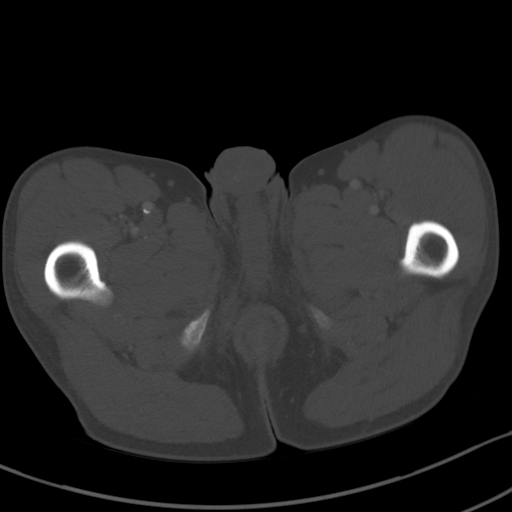
[im 14/85  soft-tissue]
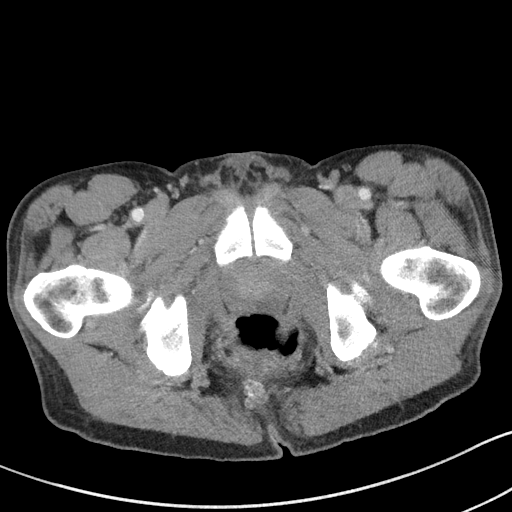
[im 18/85  soft-tissue]
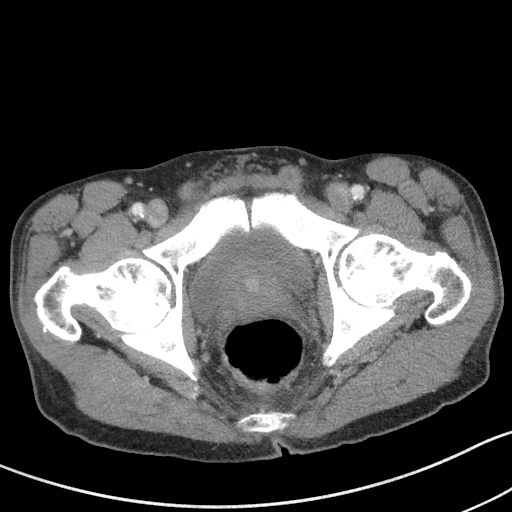
[im 23/85  soft-tissue]
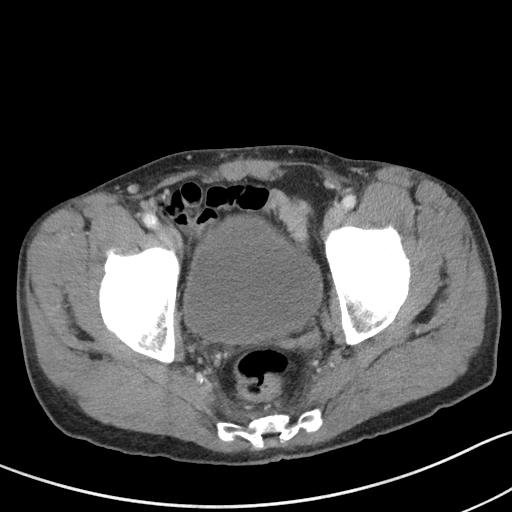
[im 31/85  soft-tissue]
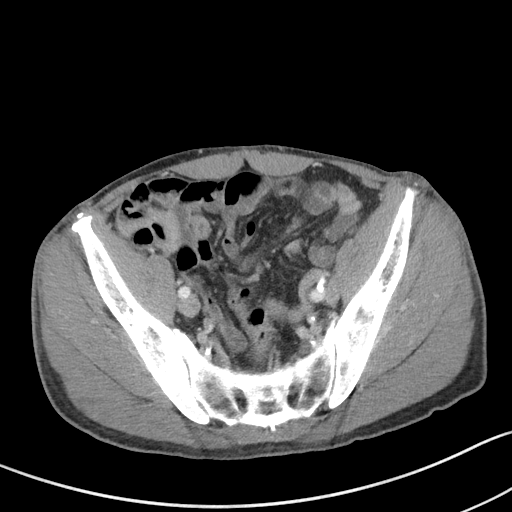
[im 36/85  soft-tissue]
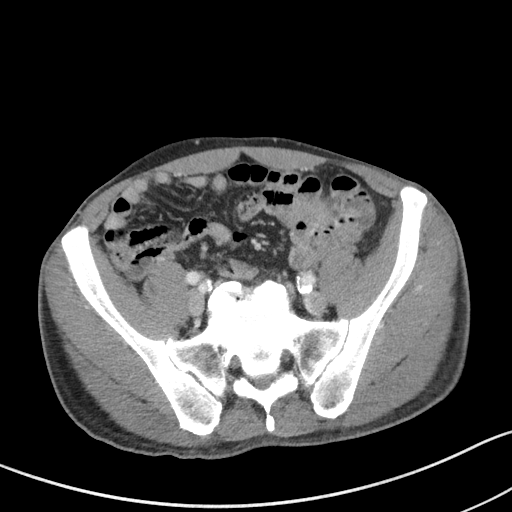
[im 45/85  soft-tissue]
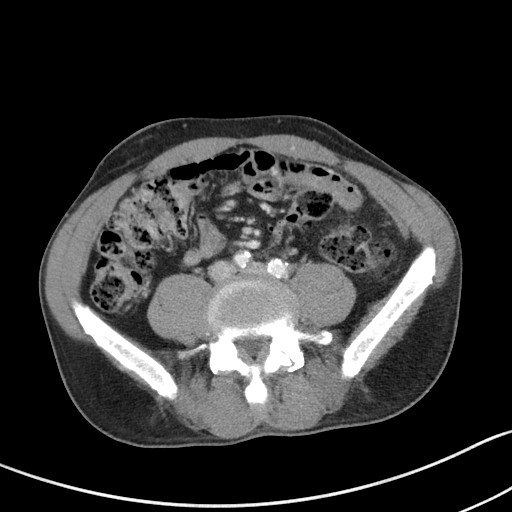
[im 49/85  soft-tissue]
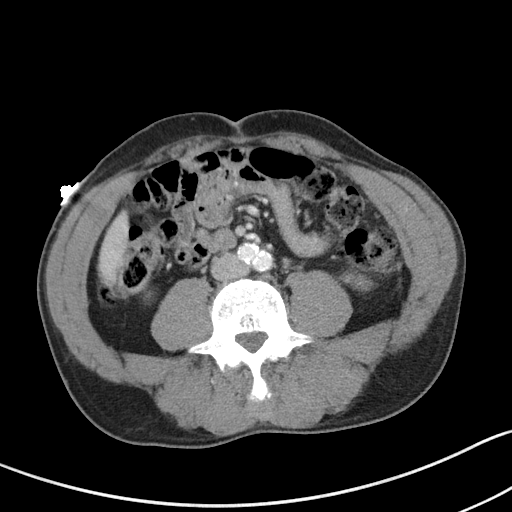
[im 54/85  soft-tissue]
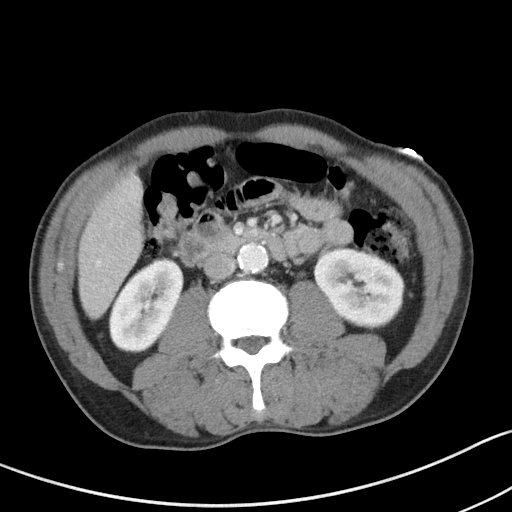
[im 54/85  bone]
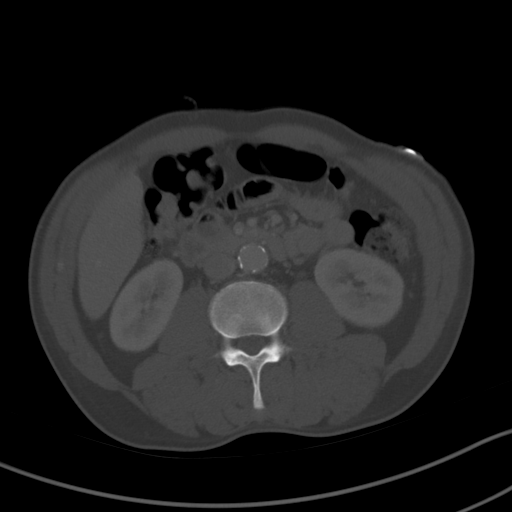
[im 62/85  soft-tissue]
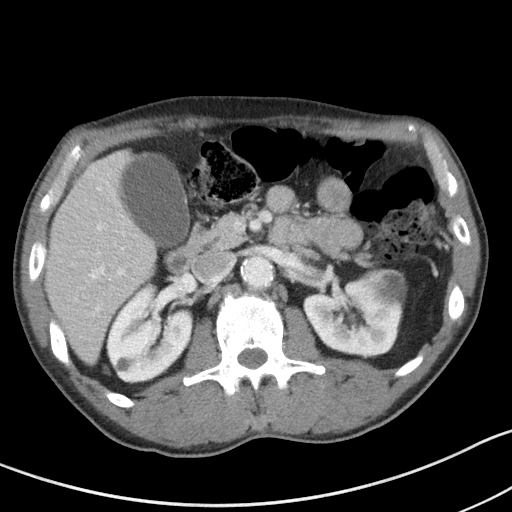
[im 67/85  soft-tissue]
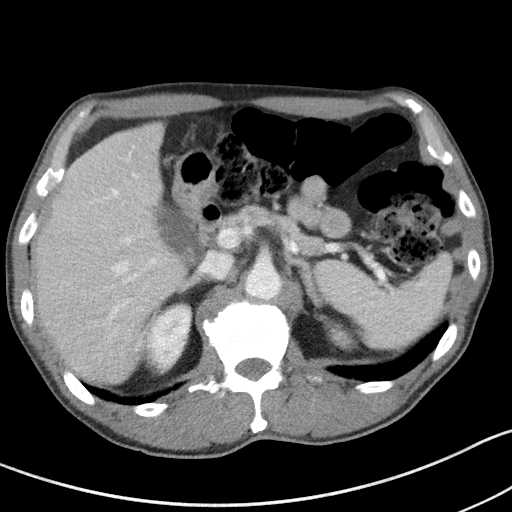
[im 71/85  soft-tissue]
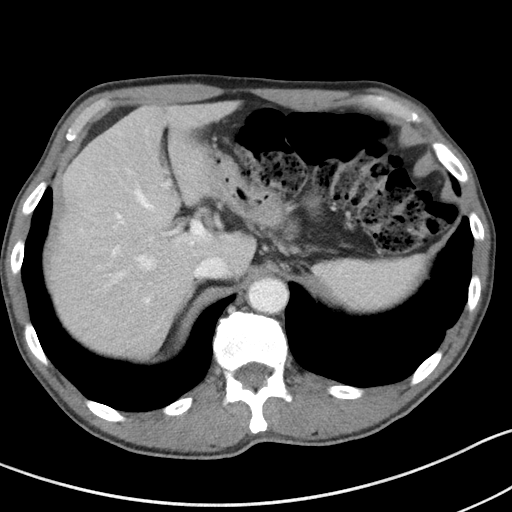
[im 80/85  soft-tissue]
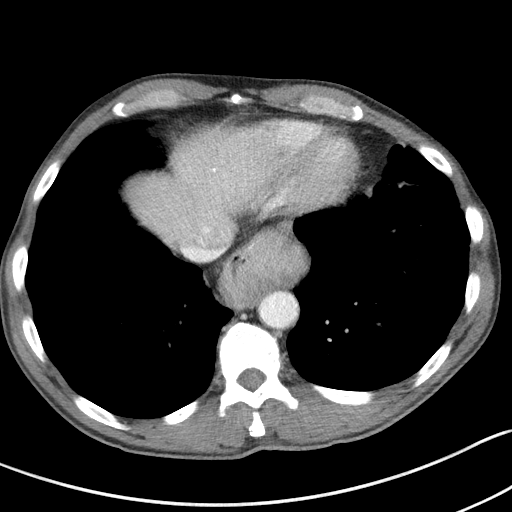

[Series 6: coronal st · coronal · 0.63mm/px · 3 of 82 slices shown]
[im 28/82  soft-tissue]
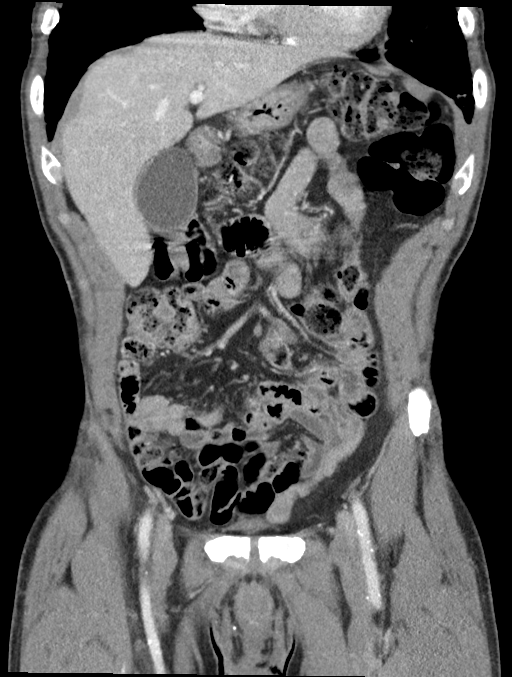
[im 37/82  soft-tissue]
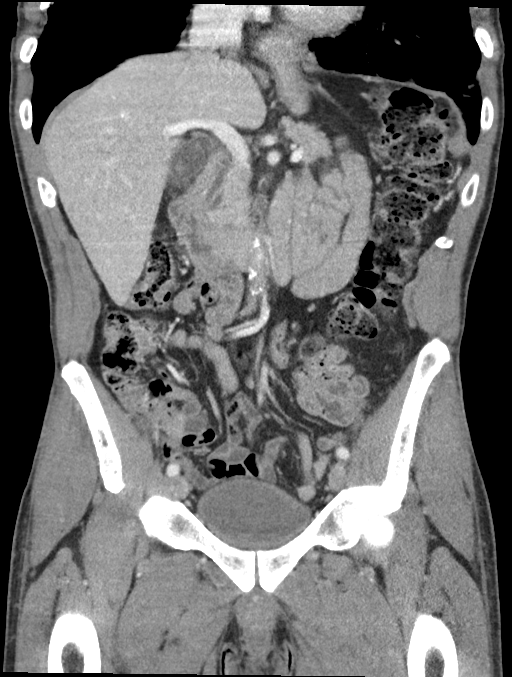
[im 46/82  soft-tissue]
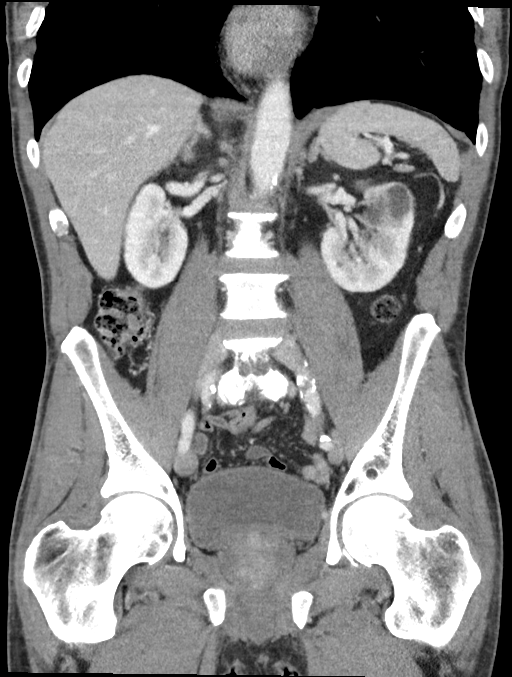

[16 of 46 positions shown; findings below may reference images not displayed]

FINDINGS: Lower chest: Motion artifact limits examination. Airspace
infiltrates in the lung bases possibly due to pneumonia. Moderate
esophageal hiatal hernia.

Hepatobiliary: No focal liver abnormality is seen. No gallstones,
gallbladder wall thickening, or biliary dilatation.

Pancreas: Unremarkable. No pancreatic ductal dilatation or
surrounding inflammatory changes.

Spleen: Normal in size without focal abnormality.

Adrenals/Urinary Tract: No adrenal gland nodules. Bilateral renal
cysts. Nephrograms are symmetrical. No hydronephrosis or
hydroureter. Bladder is unremarkable.

Stomach/Bowel: Stomach, small bowel, and colon are not abnormally
distended. No wall thickening or inflammatory change. Colon is
diffusely stool-filled. Appendix is not identified.

Vascular/Lymphatic: Aortic atherosclerosis. No enlarged abdominal or
pelvic lymph nodes.

Reproductive: Prostate gland is enlarged, measuring 5.1 cm diameter.

Other: No abdominal wall hernia or abnormality. No abdominopelvic
ascites.

Musculoskeletal: No acute or significant osseous findings.
IMPRESSION: 1. Airspace infiltrates in the lung bases possibly due to pneumonia.
2. Moderate esophageal hiatal hernia.
3. No evidence of bowel obstruction or inflammation.
4. Enlarged prostate gland.
5. Aortic atherosclerosis.

Aortic Atherosclerosis ([id number]-[id number]).

## 2021-06-21 ENCOUNTER — Inpatient Hospital Stay
Admission: EM | Admit: 2021-06-21 | Discharge: 2021-07-05 | DRG: 177 | Disposition: A | Discharge status: Home / Self Care | Payer: Medicare Other | Attending: Internal Medicine | Admitting: Internal Medicine

## 2021-06-21 ENCOUNTER — Emergency Department: Payer: Medicare Other

## 2021-06-21 ENCOUNTER — Inpatient Hospital Stay: Payer: Medicare Other

## 2021-06-21 ENCOUNTER — Other Ambulatory Visit: Payer: Self-pay

## 2021-06-21 DIAGNOSIS — E860 Dehydration: Secondary | ICD-10-CM

## 2021-06-21 DIAGNOSIS — K219 Gastro-esophageal reflux disease without esophagitis: Secondary | ICD-10-CM | POA: Diagnosis present

## 2021-06-21 DIAGNOSIS — R4182 Altered mental status, unspecified: Secondary | ICD-10-CM | POA: Diagnosis present

## 2021-06-21 DIAGNOSIS — N179 Acute kidney failure, unspecified: Secondary | ICD-10-CM | POA: Diagnosis present

## 2021-06-21 DIAGNOSIS — G9341 Metabolic encephalopathy: Secondary | ICD-10-CM | POA: Diagnosis present

## 2021-06-21 DIAGNOSIS — Z8616 Personal history of COVID-19: Secondary | ICD-10-CM

## 2021-06-21 DIAGNOSIS — E872 Acidosis, unspecified: Secondary | ICD-10-CM | POA: Diagnosis present

## 2021-06-21 DIAGNOSIS — M1990 Unspecified osteoarthritis, unspecified site: Secondary | ICD-10-CM | POA: Diagnosis present

## 2021-06-21 DIAGNOSIS — J9601 Acute respiratory failure with hypoxia: Secondary | ICD-10-CM | POA: Diagnosis present

## 2021-06-21 DIAGNOSIS — E870 Hyperosmolality and hypernatremia: Secondary | ICD-10-CM | POA: Diagnosis present

## 2021-06-21 DIAGNOSIS — E861 Hypovolemia: Secondary | ICD-10-CM | POA: Diagnosis not present

## 2021-06-21 DIAGNOSIS — J690 Pneumonitis due to inhalation of food and vomit: Secondary | ICD-10-CM | POA: Diagnosis present

## 2021-06-21 DIAGNOSIS — Z79899 Other long term (current) drug therapy: Secondary | ICD-10-CM

## 2021-06-21 DIAGNOSIS — R0602 Shortness of breath: Secondary | ICD-10-CM

## 2021-06-21 DIAGNOSIS — E43 Unspecified severe protein-calorie malnutrition: Secondary | ICD-10-CM | POA: Diagnosis present

## 2021-06-21 DIAGNOSIS — Z85818 Personal history of malignant neoplasm of other sites of lip, oral cavity, and pharynx: Secondary | ICD-10-CM

## 2021-06-21 DIAGNOSIS — J189 Pneumonia, unspecified organism: Principal | ICD-10-CM

## 2021-06-21 DIAGNOSIS — J439 Emphysema, unspecified: Secondary | ICD-10-CM | POA: Diagnosis present

## 2021-06-21 DIAGNOSIS — C109 Malignant neoplasm of oropharynx, unspecified: Secondary | ICD-10-CM | POA: Diagnosis not present

## 2021-06-21 DIAGNOSIS — I10 Essential (primary) hypertension: Secondary | ICD-10-CM | POA: Diagnosis present

## 2021-06-21 DIAGNOSIS — R627 Adult failure to thrive: Secondary | ICD-10-CM | POA: Diagnosis present

## 2021-06-21 DIAGNOSIS — Z20822 Contact with and (suspected) exposure to covid-19: Secondary | ICD-10-CM | POA: Diagnosis present

## 2021-06-21 DIAGNOSIS — Z87891 Personal history of nicotine dependence: Secondary | ICD-10-CM

## 2021-06-21 DIAGNOSIS — K9423 Gastrostomy malfunction: Secondary | ICD-10-CM | POA: Diagnosis not present

## 2021-06-21 DIAGNOSIS — K668 Other specified disorders of peritoneum: Secondary | ICD-10-CM | POA: Diagnosis not present

## 2021-06-21 DIAGNOSIS — Z789 Other specified health status: Secondary | ICD-10-CM | POA: Diagnosis not present

## 2021-06-21 DIAGNOSIS — J9621 Acute and chronic respiratory failure with hypoxia: Secondary | ICD-10-CM | POA: Diagnosis not present

## 2021-06-21 DIAGNOSIS — Z681 Body mass index (BMI) 19 or less, adult: Secondary | ICD-10-CM

## 2021-06-21 DIAGNOSIS — L89152 Pressure ulcer of sacral region, stage 2: Secondary | ICD-10-CM | POA: Diagnosis not present

## 2021-06-21 DIAGNOSIS — L8990 Pressure ulcer of unspecified site, unspecified stage: Secondary | ICD-10-CM | POA: Insufficient documentation

## 2021-06-21 DIAGNOSIS — E119 Type 2 diabetes mellitus without complications: Secondary | ICD-10-CM

## 2021-06-21 DIAGNOSIS — E785 Hyperlipidemia, unspecified: Secondary | ICD-10-CM | POA: Diagnosis present

## 2021-06-21 DIAGNOSIS — I959 Hypotension, unspecified: Secondary | ICD-10-CM | POA: Diagnosis not present

## 2021-06-21 DIAGNOSIS — R7401 Elevation of levels of liver transaminase levels: Secondary | ICD-10-CM

## 2021-06-21 DIAGNOSIS — I472 Ventricular tachycardia, unspecified: Secondary | ICD-10-CM | POA: Diagnosis not present

## 2021-06-21 DIAGNOSIS — E1165 Type 2 diabetes mellitus with hyperglycemia: Secondary | ICD-10-CM | POA: Diagnosis present

## 2021-06-21 DIAGNOSIS — Z515 Encounter for palliative care: Secondary | ICD-10-CM | POA: Diagnosis not present

## 2021-06-21 DIAGNOSIS — Z794 Long term (current) use of insulin: Secondary | ICD-10-CM

## 2021-06-21 DIAGNOSIS — R1310 Dysphagia, unspecified: Secondary | ICD-10-CM | POA: Diagnosis present

## 2021-06-21 DIAGNOSIS — Y95 Nosocomial condition: Secondary | ICD-10-CM

## 2021-06-21 DIAGNOSIS — Z823 Family history of stroke: Secondary | ICD-10-CM

## 2021-06-21 DIAGNOSIS — J9691 Respiratory failure, unspecified with hypoxia: Secondary | ICD-10-CM

## 2021-06-21 DIAGNOSIS — R64 Cachexia: Secondary | ICD-10-CM | POA: Diagnosis present

## 2021-06-21 LAB — URINALYSIS, COMPLETE (UACMP) WITH MICROSCOPIC
Bacteria, UA: NONE SEEN
Bilirubin Urine: NEGATIVE
Glucose, UA: >=500 mg/dL — AB
Ketones, ur: NEGATIVE mg/dL
Leukocytes,Ua: NEGATIVE
Nitrite: NEGATIVE
Protein, ur: 30 mg/dL — AB
Specific Gravity, Urine: 1.026 (ref 1.005–1.030)
pH: 5 (ref 5.0–8.0)

## 2021-06-21 LAB — COMPREHENSIVE METABOLIC PANEL
ALT: 114 U/L — ABNORMAL HIGH (ref 0–44)
AST: 28 U/L (ref 15–41)
Albumin: 3.4 g/dL — ABNORMAL LOW (ref 3.5–5.0)
Alkaline Phosphatase: 101 U/L (ref 38–126)
Anion gap: 9 (ref 5–15)
BUN: 77 mg/dL — ABNORMAL HIGH (ref 8–23)
CO2: 29 mmol/L (ref 22–32)
Calcium: 9.9 mg/dL (ref 8.9–10.3)
Chloride: 115 mmol/L — ABNORMAL HIGH (ref 98–111)
Creatinine, Ser: 1.52 mg/dL — ABNORMAL HIGH (ref 0.61–1.24)
GFR, Estimated: 49 mL/min — ABNORMAL LOW (ref >60)
Glucose, Bld: 407 mg/dL — ABNORMAL HIGH (ref 70–99)
Potassium: 4.4 mmol/L (ref 3.5–5.1)
Sodium: 153 mmol/L — ABNORMAL HIGH (ref 135–145)
Total Bilirubin: 0.8 mg/dL (ref 0.3–1.2)
Total Protein: 8.8 g/dL — ABNORMAL HIGH (ref 6.5–8.1)

## 2021-06-21 LAB — CBC
HCT: 44.8 % (ref 39.0–52.0)
Hemoglobin: 13.9 g/dL (ref 13.0–17.0)
MCH: 25.9 pg — ABNORMAL LOW (ref 26.0–34.0)
MCHC: 31 g/dL (ref 30.0–36.0)
MCV: 83.4 fL (ref 80.0–100.0)
Platelets: 243 10*3/uL (ref 150–400)
RBC: 5.37 MIL/uL (ref 4.22–5.81)
RDW: 14.3 % (ref 11.5–15.5)
WBC: 11.4 10*3/uL — ABNORMAL HIGH (ref 4.0–10.5)
nRBC: 0 % (ref 0.0–0.2)

## 2021-06-21 LAB — BRAIN NATRIURETIC PEPTIDE: B Natriuretic Peptide: 107.5 pg/mL — ABNORMAL HIGH (ref 0.0–100.0)

## 2021-06-21 LAB — CBG MONITORING, ED
Glucose-Capillary: 375 mg/dL — ABNORMAL HIGH (ref 70–99)
Glucose-Capillary: 349 mg/dL — ABNORMAL HIGH (ref 70–99)
Glucose-Capillary: 319 mg/dL — ABNORMAL HIGH (ref 70–99)
Glucose-Capillary: 231 mg/dL — ABNORMAL HIGH (ref 70–99)

## 2021-06-21 LAB — BLOOD GAS, VENOUS
Acid-Base Excess: 7.1 mmol/L — ABNORMAL HIGH (ref 0.0–2.0)
Bicarbonate: 32 mmol/L — ABNORMAL HIGH (ref 20.0–28.0)
O2 Saturation: 95.5 %
Patient temperature: 37
pCO2, Ven: 45 mmHg (ref 44.0–60.0)
pH, Ven: 7.46 — ABNORMAL HIGH (ref 7.250–7.430)
pO2, Ven: 74 mmHg — ABNORMAL HIGH (ref 32.0–45.0)

## 2021-06-21 LAB — RESP PANEL BY RT-PCR (FLU A&B, COVID) ARPGX2
Influenza A by PCR: NEGATIVE
Influenza B by PCR: NEGATIVE
SARS Coronavirus 2 by RT PCR: NEGATIVE

## 2021-06-21 LAB — LACTIC ACID, PLASMA
Lactic Acid, Venous: 2.2 mmol/L (ref 0.5–1.9)
Lactic Acid, Venous: 1.8 mmol/L (ref 0.5–1.9)

## 2021-06-21 LAB — BETA-HYDROXYBUTYRIC ACID: Beta-Hydroxybutyric Acid: 0.29 mmol/L — ABNORMAL HIGH (ref 0.05–0.27)

## 2021-06-21 LAB — MRSA NEXT GEN BY PCR, NASAL: MRSA by PCR Next Gen: NOT DETECTED

## 2021-06-21 LAB — STREP PNEUMONIAE URINARY ANTIGEN: Strep Pneumo Urinary Antigen: NEGATIVE

## 2021-06-21 LAB — PROCALCITONIN: Procalcitonin: 5.35 ng/mL

## 2021-06-21 MED ORDER — ONDANSETRON HCL 4 MG/2ML IJ SOLN
4.0000 mg | Freq: Three times a day (TID) | INTRAMUSCULAR | Status: DC | PRN
  Administered 2021-07-01: 18:00:00 4 mg via INTRAVENOUS
  Filled 2021-06-21: qty 2

## 2021-06-21 MED ORDER — ALBUTEROL SULFATE (2.5 MG/3ML) 0.083% IN NEBU
2.5000 mg | INHALATION_SOLUTION | RESPIRATORY_TRACT | Status: DC | PRN
  Administered 2021-06-21 – 2021-06-27 (×3): 2.5 mg via RESPIRATORY_TRACT
  Filled 2021-06-21 (×2): qty 3

## 2021-06-21 MED ORDER — GUAIFENESIN 100 MG/5ML PO LIQD: 5.0000 mL | ORAL | Status: DC | PRN

## 2021-06-21 MED ORDER — SODIUM CHLORIDE 0.9 % IV SOLN
2.0000 g | Freq: Once | INTRAVENOUS | Status: AC
  Administered 2021-06-21: 15:00:00 2 g via INTRAVENOUS
  Filled 2021-06-21: qty 2

## 2021-06-21 MED ORDER — ACETAMINOPHEN 160 MG/5ML PO SOLN
650.0000 mg | Freq: Four times a day (QID) | ORAL | Status: DC | PRN
  Administered 2021-06-23 – 2021-07-04 (×8): 650 mg
  Filled 2021-06-21 (×15): qty 20.3

## 2021-06-21 MED ORDER — FREE WATER
250.0000 mL | Status: DC
  Filled 2021-06-21 (×2): qty 250

## 2021-06-21 MED ORDER — ACETYLCYSTEINE 10 % IN SOLN
2.0000 mL | Freq: Once | RESPIRATORY_TRACT | Status: DC
  Filled 2021-06-21: qty 4

## 2021-06-21 MED ORDER — INSULIN GLARGINE-YFGN 100 UNIT/ML ~~LOC~~ SOLN
20.0000 [IU] | Freq: Every day | SUBCUTANEOUS | Status: DC
  Administered 2021-06-21: 21:00:00 20 [IU] via SUBCUTANEOUS
  Filled 2021-06-21 (×2): qty 0.2

## 2021-06-21 MED ORDER — SODIUM CHLORIDE 0.9 % IV BOLUS: 500.0000 mL | Freq: Once | INTRAVENOUS | Status: DC

## 2021-06-21 MED ORDER — VANCOMYCIN HCL IN DEXTROSE 1-5 GM/200ML-% IV SOLN: 1000.0000 mg | Freq: Once | INTRAVENOUS | Status: DC

## 2021-06-21 MED ORDER — ACETYLCYSTEINE 20 % IN SOLN
1.0000 mL | Freq: Once | RESPIRATORY_TRACT | Status: DC
  Filled 2021-06-21: qty 4

## 2021-06-21 MED ORDER — ATORVASTATIN CALCIUM 20 MG PO TABS
20.0000 mg | ORAL_TABLET | Freq: Every day | ORAL | Status: DC
  Administered 2021-06-21 – 2021-06-25 (×4): 20 mg
  Filled 2021-06-21 (×4): qty 1

## 2021-06-21 MED ORDER — FREE WATER
250.0000 mL | Freq: Four times a day (QID) | Status: DC
  Administered 2021-06-21: 20:00:00 250 mL
  Filled 2021-06-21 (×4): qty 250

## 2021-06-21 MED ORDER — INSULIN ASPART 100 UNIT/ML IV SOLN
8.0000 [IU] | Freq: Once | INTRAVENOUS | Status: DC
  Filled 2021-06-21: qty 0.08

## 2021-06-21 MED ORDER — SODIUM CHLORIDE 0.9 % IV SOLN: INTRAVENOUS | Status: DC

## 2021-06-21 MED ORDER — IPRATROPIUM-ALBUTEROL 0.5-2.5 (3) MG/3ML IN SOLN
3.0000 mL | Freq: Once | RESPIRATORY_TRACT | Status: AC
Start: 2021-06-21 — End: 2021-06-21
  Administered 2021-06-21: 23:00:00 3 mL via RESPIRATORY_TRACT
  Filled 2021-06-21: qty 3

## 2021-06-21 MED ORDER — ENOXAPARIN SODIUM 40 MG/0.4ML IJ SOSY
40.0000 mg | PREFILLED_SYRINGE | Freq: Every day | INTRAMUSCULAR | Status: DC
  Administered 2021-06-21 – 2021-07-04 (×14): 40 mg via SUBCUTANEOUS
  Filled 2021-06-21 (×14): qty 0.4

## 2021-06-21 MED ORDER — JEVITY 1.5 CAL/FIBER PO LIQD
237.0000 mL | Freq: Every day | ORAL | Status: DC
  Administered 2021-06-21: 22:00:00 237 mL

## 2021-06-21 MED ORDER — INSULIN ASPART 100 UNIT/ML IJ SOLN
0.0000 [IU] | Freq: Three times a day (TID) | INTRAMUSCULAR | Status: DC
  Administered 2021-06-21: 18:00:00 7 [IU] via SUBCUTANEOUS
  Administered 2021-06-22 (×2): 1 [IU] via SUBCUTANEOUS
  Administered 2021-06-22: 16:00:00 3 [IU] via SUBCUTANEOUS
  Filled 2021-06-21 (×4): qty 1

## 2021-06-21 MED ORDER — SODIUM CHLORIDE 0.9 % IV BOLUS
1000.0000 mL | Freq: Once | INTRAVENOUS | Status: AC
  Administered 2021-06-21: 16:00:00 1000 mL via INTRAVENOUS

## 2021-06-21 MED ORDER — GUAIFENESIN 100 MG/5ML PO LIQD
5.0000 mL | ORAL | Status: DC | PRN
  Administered 2021-06-29: 22:00:00 5 mL
  Filled 2021-06-21 (×2): qty 5

## 2021-06-21 MED ORDER — INSULIN ASPART 100 UNIT/ML IV SOLN
5.0000 [IU] | Freq: Once | INTRAVENOUS | Status: AC
  Administered 2021-06-21: 16:00:00 5 [IU] via INTRAVENOUS
  Filled 2021-06-21: qty 0.05

## 2021-06-21 MED ORDER — INSULIN ASPART 100 UNIT/ML IJ SOLN
0.0000 [IU] | Freq: Every day | INTRAMUSCULAR | Status: DC
  Administered 2021-06-21: 23:00:00 2 [IU] via SUBCUTANEOUS
  Filled 2021-06-21: qty 1

## 2021-06-21 MED ORDER — SODIUM CHLORIDE 0.9 % IV SOLN: 2.0000 g | Freq: Two times a day (BID) | INTRAVENOUS | Status: DC

## 2021-06-21 MED ORDER — SODIUM CHLORIDE 0.9 % IV SOLN
3.0000 g | Freq: Four times a day (QID) | INTRAVENOUS | Status: DC
  Administered 2021-06-21 – 2021-06-24 (×11): 3 g via INTRAVENOUS
  Filled 2021-06-21: qty 8
  Filled 2021-06-21: qty 3
  Filled 2021-06-21 (×6): qty 8
  Filled 2021-06-21: qty 3
  Filled 2021-06-21 (×2): qty 8
  Filled 2021-06-21 (×4): qty 3

## 2021-06-21 MED ORDER — VANCOMYCIN HCL 1250 MG/250ML IV SOLN
1250.0000 mg | Freq: Once | INTRAVENOUS | Status: DC
  Filled 2021-06-21: qty 250

## 2021-06-21 MED ORDER — FAMOTIDINE 20 MG PO TABS
20.0000 mg | ORAL_TABLET | Freq: Every day | ORAL | Status: DC
  Administered 2021-06-21 – 2021-07-04 (×13): 20 mg
  Filled 2021-06-21 (×13): qty 1

## 2021-06-21 MED ORDER — METRONIDAZOLE 500 MG/100ML IV SOLN: 500.0000 mg | Freq: Two times a day (BID) | INTRAVENOUS | Status: DC

## 2021-06-21 NOTE — ED Provider Notes (Signed)
Emergency Medicine Provider Triage Evaluation Note  Louis Stone , a 70 y.o. male  was evaluated in triage.  Pt complains of confusion.  Patient himself somewhat poor historian but does identify the year is 2022 and is month of December.  He cannot really give me a good history as to why he came today however family apparently on scene reported to EMS the patient having confusion with elevated blood sugar.  EMS blood sugar was initially greater than 500 nurses report he did receive IV fluids with EMS his blood sugar is now returned down into the high 300 range.  Unknown when symptoms first started  Review of Systems  Positive: Feels fatigued and generally weak Negative: Pain or discomfort.  No headache.  No chest pain  Physical Exam  BP (!) 139/92    Pulse (!) 111    Temp 99.2 F (37.3 C) (Oral)    Resp 20    Ht 5\' 5"  (1.651 m)    Wt 54.4 kg    SpO2 93%    BMI 19.97 kg/m  Gen:   Awake, no distress but seems pretty fatigued and somewhat cachectic and underweight in appearance Resp:  Normal effort , clear MSK:   Moves extremities without difficulty but does seem generally weak in all extremities without focal deficit to noted.  Other:  Patient noted to have a feeding tube, does tell me that he does not eat anything by mouth  Medical Decision Making  Medically screening exam initiated at 12:55 PM.  Appropriate orders placed.  Aarush Hustead was informed that the remainder of the evaluation will be completed by another provider, this initial triage assessment does not replace that evaluation, and the importance of remaining in the ED until their evaluation is complete.     Delman Kitten, MD 06/21/21 1257

## 2021-06-21 NOTE — Sepsis Progress Note (Signed)
Notified bedside nurse of need to draw repeat lactic acid. 

## 2021-06-21 NOTE — Progress Notes (Signed)
Pharmacy Antibiotic Note  Louis Stone is a 70 y.o. male admitted on 06/21/2021.  Pharmacy has been consulted for Unasyn dosing.  Plan: Unasyn 3 g IV Q6 hours Monitor renal function and adjust dose as clinically indicated  Height: 5\' 5"  (165.1 cm) Weight: 54.4 kg (120 lb) IBW/kg (Calculated) : 61.5  Temp (24hrs), Avg:99.2 F (37.3 C), Min:99.2 F (37.3 C), Max:99.2 F (37.3 C)  Recent Labs  Lab 06/21/21 1244  WBC 11.4*  CREATININE 1.52*    Estimated Creatinine Clearance: 35.3 mL/min (A) (by C-G formula based on SCr of 1.52 mg/dL (H)).    No Known Allergies  Antimicrobials this admission: 12/18 Cefepime x 1  12/18 Unasyn >>    Microbiology results: 12/18 BCx: pending collection 12/18 Sputum: pending collection  12/18 MRSA PCR: pending collection  Thank you for allowing pharmacy to be a part of this patients care.  Forde Dandy Shantice Menger 06/21/2021 3:14 PM

## 2021-06-21 NOTE — Progress Notes (Signed)
PHARMACY -  BRIEF ANTIBIOTIC NOTE   Pharmacy has received consult(s) for vancomycin and cefepime from an ED provider.  The patient's profile has been reviewed for ht/wt/allergies/indication/available labs.    One time order(s) placed for: Cefepime 2 g IV Vancomycin 1250 mg IV   Further antibiotics/pharmacy consults should be ordered by admitting physician if indicated.                       Thank you, Forde Dandy Kalyani Maeda 06/21/2021  2:56 PM

## 2021-06-21 NOTE — Sepsis Progress Note (Signed)
[place] following sepsis 

## 2021-06-21 NOTE — ED Provider Notes (Signed)
[facility] Emergency Department Provider Note   ____________________________________________   Event Date/Time   First MD Initiated Contact with Patient 06/21/21 1426     (approximate)  I have reviewed the triage vital signs and the nursing notes.   HISTORY  Chief Complaint Altered Mental Status    HPI Patient himself somewhat poor historian but does identify the year is 2022 and is month of December.  He cannot really give me a good history as to why he came today however family apparently on scene reported to EMS the patient having confusion with elevated blood sugar.  EMS blood sugar was initially greater than 500 nurses report he did receive IV fluids with EMS his blood sugar is now returned down into the high 300 range.  Unknown when symptoms first started  History recent hospitalization for hypoglycemia.  History of prior sepsis aspiration pneumonia squamous cell carcinoma  Past Medical History:  Diagnosis Date   Arthritis    per pt. I do not have md notes that indicate dx.   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, M0   Personal history of tobacco use, presenting hazards to health 10/08/2015    Patient Active Problem List   Diagnosis Date Noted   HCAP (healthcare-associated pneumonia) 06/21/2021   Diabetes mellitus without complication ([place]) 76/73/4193   Dehydration 06/21/2021   Hypernatremia 06/21/2021   HLD (hyperlipidemia) 06/21/2021   Arrhythmia 06/06/2021   Hypoglycemia due to insulin 06/05/2021   Aspiration pneumonia ([place]) 03/12/2021   Protein-calorie malnutrition, severe 02/02/2021   Acute respiratory failure with hypoxia ([place]) 02/01/2021   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   Acute renal failure (ARF) ([place]) 02/01/2021   Acute left-sided weakness 79/08/4095   Acute metabolic encephalopathy 35/32/9924   Mixed diabetic hyperlipidemia  associated with type 2 diabetes mellitus ([place]) 07/16/2020   Uncontrolled type 2 diabetes mellitus with hypoglycemia without coma, with long-term current use of insulin ([place]) 07/16/2020   COVID-19 virus infection 07/16/2020   GERD without esophagitis 07/16/2020   Bradycardia 07/16/2020   Severe sepsis ([place]) 09/16/2016   Personal history of tobacco use, presenting hazards to health 10/08/2015   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 Authorizing Provider  ACCU-CHEK AVIVA PLUS test strip  09/04/20   [provider]  acetaminophen (TYLENOL) 325 MG tablet Take 650 mg by mouth every 6 (six) hours as needed for headache or mild pain.    [provider]  albuterol (PROVENTIL HFA;VENTOLIN HFA) 108 (90 Base) MCG/ACT inhaler Inhale 2 puffs into the lungs every 6 (six) hours as needed for wheezing.    [provider]  albuterol (PROVENTIL) (2.5 MG/3ML) 0.083% nebulizer solution Inhale 3 mLs into the lungs every 6 (six) hours as needed for wheezing. 06/09/21   Swayze, Ava, DO  atorvastatin (LIPITOR) 20 MG tablet Place 1 tablet (20 mg total) into feeding tube daily. 03/21/21   Wouk, Ailene Rud, MD  famotidine (PEPCID) 40 MG tablet Place 1 tablet (40 mg total) into feeding tube daily. 03/21/21   Wouk, Ailene Rud, MD  guaiFENesin (MUCINEX) 600 MG 12 hr tablet Take 2 tablets (1,200 mg total) by mouth 2 (two) times daily. 06/09/21 06/09/22  Swayze, Ava, DO  metoprolol succinate (TOPROL-XL) 25 MG 24 hr tablet Take 0.5 tablets (12.5 mg total) by mouth daily. 06/10/21    Swayze, Ava, DO  Nutritional Supplements (FEEDING SUPPLEMENT, JEVITY 1.5 CAL/FIBER,) LIQD Place 237 mLs into feeding tube 6 (six) times daily. 06/09/21   Swayze, Ava, DO  omeprazole (PRILOSEC) 40 MG capsule Take 40 mg by mouth daily. 06/06/20   [provider]  TOUJEO SOLOSTAR 300 UNIT/ML Solostar Pen Inject 15 Units into the skin daily. 07/21/20   Terrilee Croak, MD  Water For Irrigation, Sterile (FREE WATER) SOLN Place 100 mLs into feeding tube 6 (six) times daily. 03/21/21   Wouk, Ailene Rud, MD    Allergies Patient has no known allergies.  Family History  Problem Relation Age of Onset   CVA Father    Prostate cancer Neg Hx        not sure what family member    Social History Social History   Tobacco Use   Smoking status: Former    Packs/day: 1.50    Years: 40.00    Pack years: 60.00    Types: Cigarettes    Quit date: 07/05/2006    Years since quitting: 14.9   Smokeless tobacco: Never  Substance Use Topics   Alcohol use: No    Comment: quit in 1991   Drug use: No    Review of Systems Constitutional: No fever/chills but feels fatigue Eyes: No visual changes. ENT: No sore throat. Cardiovascular: Denies chest pain. Respiratory: Denies shortness of breath. Gastrointestinal: No abdominal pain. [place] tube feeds at home Genitourinary: Negative for dysuria. Musculoskeletal: Negative for back pain. Skin: Negative for rash.  Scaly skin on his feet that itches from time to time. Neurological: Negative for headaches, areas of focal weakness or numbness.  Fatigued    ____________________________________________   PHYSICAL EXAM:  VITAL SIGNS: ED Triage Vitals  Enc Vitals Group     BP 06/21/21 1241 (!) 139/92     Pulse Rate 06/21/21 1241 (!) 111     Resp 06/21/21 1241 20     Temp 06/21/21 1241 99.2 F (37.3 C)     Temp Source 06/21/21 1241 Oral     SpO2 06/21/21 1240 (!) 88 %     Weight 06/21/21 1239 120 lb (54.4 kg)     Height 06/21/21 1239 5\' 5"  (1.651 m)      Head Circumference --      Peak Flow --      Pain Score 06/21/21 1239 4     Pain Loc --      Pain Edu? --      Excl. in [place]? --     Constitutional: Alert and oriented.  Chronically ill-appearing but no acute distress.  He is now alert and oriented to year place and month.  Reportedly was having disorientation at home Eyes: Conjunctivae are normal. Head: Atraumatic. Nose: No congestion/rhinnorhea. Mouth/Throat: Mucous membranes are very dry. Neck: No stridor.  Cardiovascular: Tachycardic rate, regular rhythm. Grossly normal heart sounds.  Good peripheral circulation. Respiratory: Normal respiratory effort.  No retractions. Lungs CTAB. Gastrointestinal: Soft and nontender. No distention.  Feeding tube in place, no surrounding edema or erythema. Musculoskeletal: No lower extremity tenderness nor edema. Neurologic:  Normal speech and language. No gross focal neurologic deficits are appreciated.  Skin:  Skin is warm, dry and intact. No rash noted.  Feet warm well perfused, does demonstrate flaky dry skin which she reports is itching slightly, but no evidence of erythema or skin breakdown to noted. Psychiatric: Mood and affect are normal. Speech and behavior are normal.  ____________________________________________   LABS (all labs ordered are listed, but only abnormal results are displayed)  Labs Reviewed  COMPREHENSIVE METABOLIC PANEL - Abnormal; Notable for the following components:      Result Value   Sodium 153 (*)    Chloride 115 (*)    Glucose, Bld 407 (*)    BUN 77 (*)    Creatinine, Ser 1.52 (*)    Total Protein 8.8 (*)    Albumin 3.4 (*)    ALT 114 (*)    GFR, Estimated 49 (*)    All other components within normal limits  CBC - Abnormal; Notable for the following components:   WBC 11.4 (*)    MCH 25.9 (*)    All other components within normal limits  BETA-HYDROXYBUTYRIC ACID - Abnormal; Notable for the following components:   Beta-Hydroxybutyric Acid 0.29 (*)     All other components within normal limits  CBG MONITORING, ED - Abnormal; Notable for the following components:   Glucose-Capillary 375 (*)    All other components within normal limits  RESP PANEL BY RT-PCR (FLU A&B, COVID) ARPGX2  CULTURE, BLOOD (ROUTINE X 2)  CULTURE, BLOOD (ROUTINE X 2)  PROCALCITONIN  URINALYSIS, COMPLETE (UACMP) WITH MICROSCOPIC  BLOOD GAS, VENOUS  LACTIC ACID, PLASMA  CBG MONITORING, ED   ____________________________________________  EKG  Reviewed inter by me at 1246 Heart rate 110 QRS 99 QTc 440 Sinus tachycardia, with no notable changes including high voltages across precordial leads, may be indicative of ventricular hypertrophy.  No obvious evidence of acute ischemia noted ____________________________________________  RADIOLOGY  DG Chest 2 View  Result Date: 06/21/2021 CLINICAL DATA:  Hypoxia.  Weakness. EXAM: CHEST - 2 VIEW COMPARISON:  06/09/2021 and older studies. FINDINGS: Irregular bronchial wall thickening in the right lower lobe with subtle intervening hazy airspace opacities. This is new from the prior chest radiograph. Lungs are hyperexpanded but otherwise clear. No pleural effusion or pneumothorax. Cardiac silhouette is normal in size. No mediastinal or hilar masses or evidence of adenopathy. Skeletal structures are intact. IMPRESSION: 1. Bronchial wall thickening with subtle intervening hazy airspace opacities in the right lower lobe. This is consistent with bronchopneumonia in the proper clinical setting. 2. No other evidence of acute cardiopulmonary disease. Electronically Signed   By: Lajean Manes M.D.   On: 06/21/2021 13:35   CT HEAD WO CONTRAST (5MM)  Result Date: 06/21/2021 CLINICAL DATA:  Patient himself somewhat poor historian but does identify the year is 2022 and is month of December. He cannot really give me a good history as to why he came today however family apparently on scene reported to EMS the patient having confusion with  elevated blood sugar. EMS blood sugar was initially greater than 500 nurses report he did receive IV fluids with EMS his blood sugar is now returned down into the high 300 range. Unknown when symptoms first started EXAM: CT HEAD WITHOUT CONTRAST TECHNIQUE: Contiguous axial images were obtained from the base of the skull through the vertex without intravenous contrast. COMPARISON:  06/05/2021. FINDINGS: Brain: No evidence of acute infarction, hemorrhage, hydrocephalus, extra-axial collection or mass lesion/mass effect. Ventricular and sulcal  enlargement reflecting mild diffuse atrophy, unchanged. Small focus of hypoattenuation in the right parietal lobe consistent with an old infarct, also stable. Vascular: No hyperdense vessel or unexpected calcification. Skull: Normal. Negative for fracture or focal lesion. Sinuses/Orbits: Globes and orbits are unremarkable. Visualized sinuses are clear. Other: None. IMPRESSION: 1. No acute intracranial abnormalities. No change from the recent prior head CT. Electronically Signed   By: Lajean Manes M.D.   On: 06/21/2021 13:37    2 view chest x-ray reviewed, personally viewed by me.  Hazy airspace opacity in the right lower lobe.  CT head reviewed negative for acute finding. ____________________________________________   PROCEDURES  Procedure(s) performed: None  Procedures  Critical Care performed: no   ____________________________________________   INITIAL IMPRESSION / ASSESSMENT AND PLAN / ED COURSE  Pertinent labs & imaging results that were available during my care of the patient were reviewed by me and considered in my medical decision making (see chart for details).   Patient presents for altered mental status, confusion.  He does appear hypovolemic by exam not hypotensive but somewhat tachycardic dry mucous membranes has a feeding tube.  Labs reviewed show notable hypernatremia, also AKI, to be most consistent with likely secondary to diminished  intake or possible dehydration though further evaluation and follow-up is required during hospitalization  Though he denies chest pain or shortness of breath, he is noted to have a mild 2 L oxygen deficit.  Chest x-ray findings demonstrate opacification in the right lower lobe, most consistent with pneumonia possibly healthcare associated in nature.  Also though patient's GFR is appropriate, his creatinine is up from baseline and BUN is elevated at 77.  I wish to avoid nephrotoxic agents at this time, and given the appearance of his chest x-ray and imaging and the clinical history I do not feel strongly that he needs a CT angiogram to exclude pulmonary embolism at this moment in time, especially in the setting of AKI  He has no clinical examination findings that would be suggestive of pulmonary embolism, no chest pain no pleuritic discomfort, and given his clinical history and review of imaging I suspect he likely has some element of pneumonia possibly aspiration like given his clinical history  Also hyperglycemia, blood glucose is improving from greater than 500 with EMS down to the 300 range now, will recheck glucose now and consideration for initiating insulin, but awaiting repeat glucose at time of admission decision   Admission discussed with hospitalist Dr. Blaine Hamper  Discussed with the patient patient understanding agreeable with plan for admission as well     ____________________________________________   FINAL CLINICAL IMPRESSION(S) / ED DIAGNOSES  Final diagnoses:  HCAP (healthcare-associated pneumonia)  Dehydration  Hypernatremia        Note:  This document was prepared using Dragon voice recognition software and may include unintentional dictation errors       Delman Kitten, MD 06/21/21 1507

## 2021-06-21 NOTE — Progress Notes (Signed)
CODE SEPSIS - PHARMACY COMMUNICATION  **Broad Spectrum Antibiotics should be administered within 1 hour of Sepsis diagnosis**  Time Code Sepsis Called/Page Received: 1447  Antibiotics Ordered: cefepime and vancomycin  Time of 1st antibiotic administration: [place] ,PharmD Clinical Pharmacist  06/21/2021  2:54 PM

## 2021-06-21 NOTE — ED Notes (Signed)
Rn to bedside.

## 2021-06-21 NOTE — Progress Notes (Signed)
Patient seen per ordering MD to assess for low o2 sats.  Discussed whether or not mucomyst would be beneficial for this patient due to thick secretions. Advised mucomyst may be beneficial however effects are not always immediate. Patient is tachycardic and respirations are shallow and non labored.  Advised HFNC and any issues to decreased HR (125 BPM). On 6 liters o2 sats noted at 88-89. On 10L HFNC sats are basically same. Patient is able to use flutter to help mobilize secretions and demonstrates understanding of how to operate.

## 2021-06-21 NOTE — ED Triage Notes (Signed)
Pt arrives via EMS for altered mental status and cbg over 500 per EMS- pt was asking why he was here- pt only oriented to person

## 2021-06-21 NOTE — H&P (Addendum)
History and Physical    [name] FYB:[id number] DOB: Sep 04, 1950 DOA: 06/21/2021  Referring MD/NP/PA:   PCP: Theotis Burrow, MD   Patient coming from:  The patient is coming from home.     Chief Complaint: SOB and cough  HPI: Louis Stone is a 70 y.o. male with medical history significant of oropharynx cancer, s/p of G-tube placement, diabetes mellitus, hyperlipidemia, GERD, former smoker, aspiration pneumonia, who presents with shortness breath and cough.  Pt is a poor historian. History is limited. He states that he has cough and shortness of breath for few days.  Denies chest pain.  No sputum production.  No fever or chills.  Patient states that he had diarrhea yesterday, which has resolved.  Currently no nausea, vomiting, diarrhea or abdominal pain.  No symptoms of UTI.  Per ED physician, patient initially had confusion, but when I saw patient in the ED, his mental status has improved.  He is orientated x3.  He moves all extremities normally.  No facial droop or slurred speech. Per EMS report, pt had blood sugar > 500. He was given IVF and blood sugar is 407 and then 375 in ED.   ED Course: pt was found to have WBC 11.4, AKI, pending COVID-19 PCR, anion gap 9, temperature 99.2, blood pressure 139/92, heart rate 110, RR 20, oxygen saturation 88% on room air, which improved to 93-94% on 2 L oxygen.  Chest x-ray showed right lower lobe infiltration.  CT of the head is negative for acute intracranial abnormalities.   Review of Systems:   General: no fevers, chills, no body weight gain, has fatigue HEENT: no blurry vision, hearing changes or sore throat Respiratory: has dyspnea, coughing, no wheezing CV: no chest pain, no palpitations GI: no nausea, vomiting, abdominal pain, had diarrhea yesterday, no constipation GU: no dysuria, burning on urination, increased urinary frequency, hematuria  Ext: no leg edema Neuro: no unilateral weakness, numbness, or tingling,  no vision change or hearing loss. Had confusion earlier Skin: no rash, no skin tear. MSK: No muscle spasm, no deformity, no limitation of range of movement in spin Heme: No easy bruising.  Travel history: No recent long distant travel.  Allergy: No Known Allergies  Past Medical History:  Diagnosis Date   Arthritis    per pt. I do not have md notes that indicate dx.   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 He has a 60.00 pack-year smoking history. He has never used smokeless tobacco. He reports that he does not drink alcohol and does not use drugs.  Family History:  Family History  Problem Relation Age of Onset   CVA Father    Prostate cancer Neg Hx        not sure what family member     Prior to Admission medications   Medication Sig Start Date End Date Taking? Authorizing Provider  ACCU-CHEK AVIVA  PLUS test strip  09/04/20   [provider]  acetaminophen (TYLENOL) 325 MG tablet Take 650 mg by mouth every 6 (six) hours as needed for headache or mild pain.    [provider]  albuterol (PROVENTIL HFA;VENTOLIN HFA) 108 (90 Base) MCG/ACT inhaler Inhale 2 puffs into the lungs every 6 (six) hours as needed for wheezing.    [provider]  albuterol (PROVENTIL) (2.5 MG/3ML) 0.083% nebulizer solution Inhale 3 mLs into the lungs every 6 (six) hours as needed for wheezing. 06/09/21   Swayze, Ava, DO  atorvastatin (LIPITOR) 20 MG tablet Place 1  tablet (20 mg total) into feeding tube daily. 03/21/21   Wouk, Ailene Rud, MD  famotidine (PEPCID) 40 MG tablet Place 1 tablet (40 mg total) into feeding tube daily. 03/21/21   Wouk, Ailene Rud, MD  guaiFENesin (MUCINEX) 600 MG 12 hr tablet Take 2 tablets (1,200 mg total) by mouth 2 (two) times daily. 06/09/21 06/09/22  Swayze, Ava, DO  metoprolol succinate (TOPROL-XL) 25 MG 24 hr tablet Take 0.5 tablets (12.5 mg total) by mouth daily. 06/10/21   Swayze, Ava, DO  Nutritional Supplements (FEEDING SUPPLEMENT, JEVITY 1.5 CAL/FIBER,) LIQD Place 237 mLs into feeding tube 6 (six) times daily. 06/09/21   Swayze, Ava, DO  omeprazole (PRILOSEC) 40 MG capsule Take 40 mg by mouth daily. 06/06/20   [provider]  TOUJEO SOLOSTAR 300 UNIT/ML Solostar Pen Inject 15 Units into the skin daily. 07/21/20   Terrilee Croak, MD  Water For Irrigation, Sterile (FREE WATER) SOLN Place 100 mLs into feeding tube 6 (six) times daily. 03/21/21   Gwynne Edinger, MD    Physical Exam: Vitals:   06/21/21 1239 06/21/21 1240 06/21/21 1241  BP:   (!) 139/92  Pulse:   (!) 111  Resp:   20  Temp:   99.2 F (37.3 C)  TempSrc:   Oral  SpO2:  (!) 88% 93%  Weight: 54.4 kg    Height: 5\' 5"  (1.651 m)     General: Not in acute distress.  Very thin body habitus, dry mucous membrane HEENT:       Eyes: PERRL, EOMI, no scleral icterus.       ENT: No discharge from the ears and nose, no pharynx injection, no tonsillar enlargement.        Neck: No JVD, no bruit, no mass felt. Heme: No neck lymph node enlargement. Cardiac: S1/S2, RRR, No murmurs, No gallops or rubs. Respiratory: Has coarse breathing sound bilaterally GI: Soft, nondistended, nontender, no rebound pain, no organomegaly, BS present. S/p of G-tube. GU: No hematuria Ext: No pitting leg edema bilaterally. 1+DP/PT pulse bilaterally. Musculoskeletal: No joint deformities, No joint redness or warmth, no limitation of ROM in spin. Skin: No rashes.  Neuro: Alert,  oriented X3, cranial nerves II-XII grossly intact, moves all extremities normally.  Psych: Patient is not psychotic, no suicidal or hemocidal ideation.  Labs on Admission: I have personally reviewed following labs and imaging studies  CBC: Recent Labs  Lab 06/21/21 1244  WBC 11.4*  HGB 13.9  HCT 44.8  MCV 83.4  PLT 222   Basic Metabolic Panel: Recent Labs  Lab 06/21/21 1244  NA 153*  K 4.4  CL 115*  CO2 29  GLUCOSE 407*  BUN 77*  CREATININE 1.52*  CALCIUM 9.9   GFR: Estimated Creatinine Clearance: 35.3 mL/min (A) (by C-G formula based on SCr of 1.52 mg/dL (H)). Liver Function Tests: Recent Labs  Lab 06/21/21 1244  AST  28  ALT 114*  ALKPHOS 101  BILITOT 0.8  PROT 8.8*  ALBUMIN 3.4*   No results for input(s): LIPASE, AMYLASE in the last 168 hours. No results for input(s): AMMONIA in the last 168 hours. Coagulation Profile: No results for input(s): INR, PROTIME in the last 168 hours. Cardiac Enzymes: No results for input(s): CKTOTAL, CKMB, CKMBINDEX, TROPONINI in the last 168 hours. BNP (last 3 results) No results for input(s): PROBNP in the last 8760 hours. HbA1C: No results for input(s): HGBA1C in the last 72 hours. CBG: Recent Labs  Lab 06/21/21 1241 06/21/21 1501  GLUCAP 375* 349*   Lipid Profile: No results for input(s): CHOL, HDL, LDLCALC, TRIG, CHOLHDL, LDLDIRECT in the last 72 hours. Thyroid Function Tests: No results for input(s): TSH, T4TOTAL, FREET4, T3FREE, THYROIDAB in the last 72 hours. Anemia Panel: No results for input(s): VITAMINB12, FOLATE, FERRITIN, TIBC, IRON, RETICCTPCT in the last 72 hours. Urine analysis:    Component Value Date/Time   COLORURINE YELLOW (A) 02/16/2021 0615   APPEARANCEUR HAZY (A) 02/16/2021 0615   LABSPEC 1.025 02/16/2021 0615   PHURINE 5.0 02/16/2021 0615   GLUCOSEU 50 (A) 02/16/2021 0615   HGBUR NEGATIVE 02/16/2021 0615   BILIRUBINUR NEGATIVE 02/16/2021 0615   KETONESUR NEGATIVE 02/16/2021 0615    PROTEINUR NEGATIVE 02/16/2021 0615   NITRITE NEGATIVE 02/16/2021 0615   LEUKOCYTESUR NEGATIVE 02/16/2021 0615   Sepsis Labs: @LABRCNTIP (procalcitonin:4,lacticidven:4) )No results found for this or any previous visit (from the past 240 hour(s)).   Radiological Exams on Admission: DG Chest 2 View  Result Date: 06/21/2021 CLINICAL DATA:  Hypoxia.  Weakness. EXAM: CHEST - 2 VIEW COMPARISON:  06/09/2021 and older studies. FINDINGS: Irregular bronchial wall thickening in the right lower lobe with subtle intervening hazy airspace opacities. This is new from the prior chest radiograph. Lungs are hyperexpanded but otherwise clear. No pleural effusion or pneumothorax. Cardiac silhouette is normal in size. No mediastinal or hilar masses or evidence of adenopathy. Skeletal structures are intact. IMPRESSION: 1. Bronchial wall thickening with subtle intervening hazy airspace opacities in the right lower lobe. This is consistent with bronchopneumonia in the proper clinical setting. 2. No other evidence of acute cardiopulmonary disease. Electronically Signed   By: Lajean Manes M.D.   On: 06/21/2021 13:35   CT HEAD WO CONTRAST (5MM)  Result Date: 06/21/2021 CLINICAL DATA:  Patient himself somewhat poor historian but does identify the year is 2022 and is month of December. He cannot really give me a good history as to why he came today however family apparently on scene reported to EMS the patient having confusion with elevated blood sugar. EMS blood sugar was initially greater than 500 nurses report he did receive IV fluids with EMS his blood sugar is now returned down into the high 300 range. Unknown when symptoms first started EXAM: CT HEAD WITHOUT CONTRAST TECHNIQUE: Contiguous axial images were obtained from the base of the skull through the vertex without intravenous contrast. COMPARISON:  06/05/2021. FINDINGS: Brain: No evidence of acute infarction, hemorrhage, hydrocephalus, extra-axial collection or mass  lesion/mass effect. Ventricular and sulcal enlargement reflecting mild diffuse atrophy, unchanged. Small focus of hypoattenuation in the right parietal lobe consistent with an old infarct, also stable. Vascular: No hyperdense vessel or unexpected calcification. Skull: Normal. Negative for fracture or focal lesion. Sinuses/Orbits: Globes and orbits are unremarkable. Visualized sinuses are clear. Other: None. IMPRESSION: 1. No acute intracranial abnormalities. No change from the recent prior head CT. Electronically Signed   By: Dedra Skeens.D.  On: 06/21/2021 13:37     EKG: I have personally reviewed.  Sinus rhythm, QTC 442, unstable baseline,  Assessment/Plan Principal Problem:   Aspiration pneumonia (HCC) Active Problems:   Acute metabolic encephalopathy   GERD without esophagitis   Acute respiratory failure with hypoxia (HCC)   Squamous cell carcinoma of oropharynx (HCC)   Acute renal failure (ARF) (HCC)   Protein-calorie malnutrition, severe   Diabetes mellitus without complication (HCC)   Dehydration   Hypernatremia   HLD (hyperlipidemia)  Acute respiratory failure with hypoxia due to aspiration pneumonia [facility]): Chest x-ray showed right lower lobe infiltration, patient has cough and shortness breath, clinically consistent with aspiration pneumonia.  Patient has mild leukocytosis 11.4, tachycardia with heart rate of 111, but no tachypnea or fever.  Does not meet criteria for sepsis at admission.  -Admitted to progressive unit as inpatient  - IV Unasyn (patient received 1 dose of cefepime in ED) - prn Cough syrup for cough  - Bronchodilators - Urine legionella and S. pneumococcal antigen - Follow up blood culture x2, sputum culture - IVF: 1L of NS bolus in ED, followed by 75 mL per hour  Acute metabolic encephalopathy: Has resolved.  CT head negative. -Frequent neurochecks  GERD without esophagitis -Pepcid  Squamous cell carcinoma of oropharynx  -Gastrostomy tube dependent  feeding now -continue Jevity  Acute renal failure (ARF) ([place]): Recent creatinine 0.68 on 11/07/2020.  His creatinine is 1.52, BUN 77.  Most likely due to dehydration -IV fluid as above -Free water 250 cc every 6 hours per tube  Dehydration: -IVF as above  Protein-calorie malnutrition, severe -On tube feeding  Diabetes mellitus without complication [facility]): Recent A1c 6.8, poorly controlled.  Patient still taking Toujeo 25 units daily -Sliding scale insulin -Glargine insulin 20 units daily  Hypernatremia: Sodium 153.  Most likely due to decreased oral intake, dehydration - IVF: 1L of NS bolus in ED, followed by 75 mL/h - -Free water 250 cc every 6 hours per tube - check BMP q8h  HLD (hyperlipidemia) -Lipitor      DVT ppx: SQ Lovenox Code Status: Full code Family Communication: Yes, patient's  sister  at bed side.   Disposition Plan:  Anticipate discharge back to previous environment Consults called:  none Admission status and Level of care: Progressive:  as inpt      Status is: Inpatient  Remains inpatient appropriate because: Patient has multiple comorbidities, s/p of G-tube replacement due to oropharynx cancer, presents with recurrent aspiration pneumonia and acute respiratory failure with hypoxia.  He is also severely dehydrated, with hypernatremia with sodium of 153.  His presentation is highly complicated.  Patient is at high risk of deteriorating.  Will need to be treated in hospital for at least 2 days.           Date of Service 06/21/2021    [facility]   If 7PM-7AM, please contact night-coverage [email or web address] 06/21/2021, 4:05 PM

## 2021-06-21 NOTE — ED Notes (Signed)
Awake and alert. Resting on bed. Watching TV. Sister present in room. Very difficult to understand patient verbally. Baseline from previous nurse report. VS reassessed. Medications given. Urinal emptied. Skin WDL per ethnicity. Skin warm and dry. RR even and non-labored. Normal saline infusing at 75 ml/ hr.

## 2021-06-22 ENCOUNTER — Inpatient Hospital Stay: Payer: Medicare Other

## 2021-06-22 ENCOUNTER — Encounter: Payer: Self-pay | Admitting: Internal Medicine

## 2021-06-22 ENCOUNTER — Other Ambulatory Visit: Payer: Self-pay

## 2021-06-22 DIAGNOSIS — E860 Dehydration: Secondary | ICD-10-CM | POA: Diagnosis not present

## 2021-06-22 DIAGNOSIS — E870 Hyperosmolality and hypernatremia: Secondary | ICD-10-CM

## 2021-06-22 DIAGNOSIS — N179 Acute kidney failure, unspecified: Secondary | ICD-10-CM

## 2021-06-22 DIAGNOSIS — K668 Other specified disorders of peritoneum: Secondary | ICD-10-CM

## 2021-06-22 DIAGNOSIS — J9601 Acute respiratory failure with hypoxia: Secondary | ICD-10-CM | POA: Diagnosis present

## 2021-06-22 DIAGNOSIS — G9341 Metabolic encephalopathy: Secondary | ICD-10-CM | POA: Diagnosis not present

## 2021-06-22 DIAGNOSIS — J690 Pneumonitis due to inhalation of food and vomit: Secondary | ICD-10-CM | POA: Diagnosis not present

## 2021-06-22 LAB — BLOOD CULTURE ID PANEL (REFLEXED) - BCID2

## 2021-06-22 LAB — CBC
HCT: 46 % (ref 39.0–52.0)
Hemoglobin: 14.1 g/dL (ref 13.0–17.0)
MCH: 25.7 pg — ABNORMAL LOW (ref 26.0–34.0)
MCHC: 30.7 g/dL (ref 30.0–36.0)
MCV: 83.8 fL (ref 80.0–100.0)
Platelets: 227 10*3/uL (ref 150–400)
RBC: 5.49 MIL/uL (ref 4.22–5.81)
RDW: 14.4 % (ref 11.5–15.5)
WBC: 9.8 10*3/uL (ref 4.0–10.5)
nRBC: 0 % (ref 0.0–0.2)

## 2021-06-22 LAB — GLUCOSE, CAPILLARY
Glucose-Capillary: 124 mg/dL — ABNORMAL HIGH (ref 70–99)
Glucose-Capillary: 150 mg/dL — ABNORMAL HIGH (ref 70–99)
Glucose-Capillary: 158 mg/dL — ABNORMAL HIGH (ref 70–99)
Glucose-Capillary: 219 mg/dL — ABNORMAL HIGH (ref 70–99)
Glucose-Capillary: 283 mg/dL — ABNORMAL HIGH (ref 70–99)
Glucose-Capillary: 367 mg/dL — ABNORMAL HIGH (ref 70–99)

## 2021-06-22 LAB — BLOOD GAS, ARTERIAL
Acid-Base Excess: 3.9 mmol/L — ABNORMAL HIGH (ref 0.0–2.0)
Acid-Base Excess: 6.4 mmol/L — ABNORMAL HIGH (ref 0.0–2.0)
Bicarbonate: 27.7 mmol/L (ref 20.0–28.0)
Bicarbonate: 30.5 mmol/L — ABNORMAL HIGH (ref 20.0–28.0)
FIO2: 1
O2 Saturation: 88.7 %
O2 Saturation: 99.9 %
Patient temperature: 37
Patient temperature: 37
pCO2 arterial: 38 mmHg (ref 32.0–48.0)
pCO2 arterial: 41 mmHg (ref 32.0–48.0)
pH, Arterial: 7.47 — ABNORMAL HIGH (ref 7.350–7.450)
pH, Arterial: 7.48 — ABNORMAL HIGH (ref 7.350–7.450)
pO2, Arterial: 52 mmHg — ABNORMAL LOW (ref 83.0–108.0)
pO2, Arterial: 238 mmHg — ABNORMAL HIGH (ref 83.0–108.0)

## 2021-06-22 LAB — BASIC METABOLIC PANEL
Anion gap: 8 (ref 5–15)
Anion gap: 8 (ref 5–15)
BUN: 45 mg/dL — ABNORMAL HIGH (ref 8–23)
BUN: 56 mg/dL — ABNORMAL HIGH (ref 8–23)
BUN: 53 mg/dL — ABNORMAL HIGH (ref 8–23)
CO2: 21 mmol/L — ABNORMAL LOW (ref 22–32)
CO2: 31 mmol/L (ref 22–32)
CO2: 30 mmol/L (ref 22–32)
Calcium: 6.4 mg/dL — CL (ref 8.9–10.3)
Calcium: 9.6 mg/dL (ref 8.9–10.3)
Calcium: 9.4 mg/dL (ref 8.9–10.3)
Chloride: >130 mmol/L (ref 98–111)
Chloride: 123 mmol/L — ABNORMAL HIGH (ref 98–111)
Chloride: 123 mmol/L — ABNORMAL HIGH (ref 98–111)
Creatinine, Ser: 0.76 mg/dL (ref 0.61–1.24)
Creatinine, Ser: 1.15 mg/dL (ref 0.61–1.24)
Creatinine, Ser: 1.33 mg/dL — ABNORMAL HIGH (ref 0.61–1.24)
GFR, Estimated: >60 mL/min (ref >60)
GFR, Estimated: >60 mL/min (ref >60)
GFR, Estimated: 58 mL/min — ABNORMAL LOW (ref >60)
Glucose, Bld: 130 mg/dL — ABNORMAL HIGH (ref 70–99)
Glucose, Bld: 131 mg/dL — ABNORMAL HIGH (ref 70–99)
Glucose, Bld: 270 mg/dL — ABNORMAL HIGH (ref 70–99)
Potassium: 2.5 mmol/L — CL (ref 3.5–5.1)
Potassium: 3.8 mmol/L (ref 3.5–5.1)
Potassium: 4 mmol/L (ref 3.5–5.1)
Sodium: 157 mmol/L — ABNORMAL HIGH (ref 135–145)
Sodium: 162 mmol/L (ref 135–145)
Sodium: 161 mmol/L (ref 135–145)

## 2021-06-22 MED ORDER — CHLORHEXIDINE GLUCONATE CLOTH 2 % EX PADS
6.0000 | MEDICATED_PAD | Freq: Every day | CUTANEOUS | Status: DC
  Administered 2021-06-22 – 2021-07-05 (×15): 6 via TOPICAL

## 2021-06-22 MED ORDER — INSULIN ASPART 100 UNIT/ML IJ SOLN
0.0000 [IU] | INTRAMUSCULAR | Status: DC
  Administered 2021-06-22: 20:00:00 5 [IU] via SUBCUTANEOUS
  Administered 2021-06-23: 9 [IU] via SUBCUTANEOUS
  Administered 2021-06-23: 04:00:00 7 [IU] via SUBCUTANEOUS
  Administered 2021-06-23: 09:00:00 5 [IU] via SUBCUTANEOUS
  Filled 2021-06-22 (×4): qty 1

## 2021-06-22 MED ORDER — IOHEXOL 300 MG/ML  SOLN
75.0000 mL | Freq: Once | INTRAMUSCULAR | Status: AC | PRN
  Administered 2021-06-22: 03:00:00 75 mL via INTRAVENOUS

## 2021-06-22 MED ORDER — DIATRIZOATE MEGLUMINE & SODIUM 66-10 % PO SOLN
40.0000 mL | Freq: Once | ORAL | Status: AC
  Administered 2021-06-22: 07:00:00 40 mL

## 2021-06-22 MED ORDER — DEXTROSE 5 % IV SOLN: INTRAVENOUS | Status: DC

## 2021-06-22 MED ORDER — IOHEXOL 350 MG/ML SOLN
75.0000 mL | Freq: Once | INTRAVENOUS | Status: AC | PRN
  Administered 2021-06-22: 02:00:00 75 mL via INTRAVENOUS

## 2021-06-22 MED ORDER — IOHEXOL 300 MG/ML  SOLN: 80.0000 mL | Freq: Once | INTRAMUSCULAR | Status: DC | PRN

## 2021-06-22 MED ORDER — JEVITY 1.2 CAL PO LIQD
237.0000 mL | Freq: Every day | ORAL | Status: DC
  Administered 2021-06-22 – 2021-06-24 (×12): 237 mL

## 2021-06-22 MED ORDER — FREE WATER
100.0000 mL | Freq: Every day | Status: DC
  Administered 2021-06-22 – 2021-06-23 (×6): 100 mL

## 2021-06-22 NOTE — Consult Note (Signed)
[facility] CONSULT NOTE    Date: 06/22/2021                  Patient Name:  Louis Stone  MRN: [id number]  DOB: 1951-03-01  Age / Sex: 70 y.o., male         PCP: Theotis Burrow, MD                 Service Requesting Consult: Hospitalist                 Reason for Consult: Hypernatremia            History of Present Illness: Patient is a 70 y.o. male with a PMHx of oropharyngeal cancer status post G-tube placement, diabetes mellitus type 2, hyperlipidemia, GERD, former tobacco abuse, who was admitted to [facility] on 06/21/2021 for evaluation of cough as well as shortness of breath.  Initial work-up showed right lower lobe infiltrate which was concerning for aspiration pneumonia.  Patient also had hypoxia with O2 sat of 88% that improved with 2 L.  He is currently on high flow nasal cannula.  He was initially started on IV fluids with normal saline at 75 cc/h.  He was found to have severe hyponatremia.  Serum sodium currently 162.  Case discussed with Dr. Posey Pronto.  D5W to be started soon.  Patient found to be quite cachectic.  BUN also elevated at 56 with a creatinine of 1.15.  Previously his baseline creatinine was 0.7.   Medications: Outpatient medications: Medications Prior to Admission  Medication Sig Dispense Refill Last Dose   acetaminophen (TYLENOL) 325 MG tablet Take 650 mg by mouth every 6 (six) hours as needed for headache or mild pain.   Unknown at PRN   albuterol (PROVENTIL HFA;VENTOLIN HFA) 108 (90 Base) MCG/ACT inhaler Inhale 2 puffs into the lungs every 6 (six) hours as needed for wheezing.   Unknown at PRN   albuterol (PROVENTIL) (2.5 MG/3ML) 0.083% nebulizer solution Inhale 3 mLs into the lungs every 6 (six) hours as needed for wheezing. 75 mL 12 Unknown at PRN   guaiFENesin (MUCINEX) 600 MG 12 hr tablet Take 2 tablets (1,200 mg total) by mouth 2 (two) times daily. 60 tablet 2    omeprazole (PRILOSEC) 40 MG capsule Take 40 mg by mouth  daily.   Unknown at Unknown   TOUJEO SOLOSTAR 300 UNIT/ML Solostar Pen Inject 15 Units into the skin daily. (Patient taking differently: Inject 25 Units into the skin at bedtime.)   Past Week at Unknown   Water For Irrigation, Sterile (FREE WATER) SOLN Place 100 mLs into feeding tube 6 (six) times daily.      atorvastatin (LIPITOR) 20 MG tablet Place 1 tablet (20 mg total) into feeding tube daily.   Unknown at Unknown   famotidine (PEPCID) 40 MG tablet Place 1 tablet (40 mg total) into feeding tube daily.   Unknown at Unknown   metoprolol succinate (TOPROL-XL) 25 MG 24 hr tablet Take 0.5 tablets (12.5 mg total) by mouth daily. 15 tablet 0    Nutritional Supplements (FEEDING SUPPLEMENT, JEVITY 1.5 CAL/FIBER,) LIQD Place 237 mLs into feeding tube 6 (six) times daily. 237 mL 0     Current medications: Current Facility-Administered Medications  Medication Dose Route Frequency Provider Last Rate Last Admin   acetaminophen (TYLENOL) 160 MG/5ML solution 650 mg  650 mg Per Tube Q6H PRN Ivor Costa, MD       acetylcysteine (MUCOMYST) 20 % nebulizer / oral  solution 1 mL  1 mL Nebulization Once Renda Rolls, RPH       albuterol (PROVENTIL) (2.5 MG/3ML) 0.083% nebulizer solution 2.5 mg  2.5 mg Nebulization Q4H PRN Ivor Costa, MD   2.5 mg at 06/21/21 2259   Ampicillin-Sulbactam (UNASYN) 3 g in sodium chloride 0.9 % 100 mL IVPB  3 g Intravenous Q6H Rauer, Samantha O, RPH 200 mL/hr at 06/22/21 1000 3 g at 06/22/21 1000   atorvastatin (LIPITOR) tablet 20 mg  20 mg Per Tube Daily Ivor Costa, MD   20 mg at 06/21/21 2031   Chlorhexidine Gluconate Cloth 2 % PADS 6 each  6 each Topical Q0600 Rise Patience, MD   6 each at 06/22/21 0353   dextrose 5 % solution   Intravenous Continuous Fritzi Mandes, MD 125 mL/hr at 06/22/21 1053 New Bag at 06/22/21 1053   enoxaparin (LOVENOX) injection 40 mg  40 mg Subcutaneous QHS Ivor Costa, MD   40 mg at 06/21/21 2149   famotidine (PEPCID) tablet 20 mg  20 mg Per Tube Daily  Ivor Costa, MD   20 mg at 06/21/21 2033   feeding supplement (JEVITY 1.5 CAL/FIBER) liquid 237 mL  237 mL Per Tube 6 X Daily Ivor Costa, MD   237 mL at 06/21/21 2141   free water 250 mL  250 mL Per Tube Q6H Ivor Costa, MD   250 mL at 06/21/21 2029   guaiFENesin (ROBITUSSIN) 100 MG/5ML liquid 5 mL  5 mL Per Tube Q4H PRN Ivor Costa, MD       insulin aspart (novoLOG) injection 0-5 Units  0-5 Units Subcutaneous QHS Ivor Costa, MD   2 Units at 06/21/21 2314   insulin aspart (novoLOG) injection 0-9 Units  0-9 Units Subcutaneous TID WC Ivor Costa, MD   1 Units at 06/22/21 1001   ondansetron (ZOFRAN) injection 4 mg  4 mg Intravenous Q8H PRN Ivor Costa, MD          Allergies: No Known Allergies    Past Medical History: Past Medical History:  Diagnosis Date   Arthritis    per pt. I do not have md notes that indicate dx.   Diabetes mellitus without complication ([place])    Oropharynx cancer (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, M0   Personal history of tobacco use, presenting hazards to health 10/08/2015     Past Surgical History: Past Surgical History:  Procedure Laterality Date   IR GASTROSTOMY TUBE MOD SED  03/18/2021   IR MECH REMOV OBSTRUC MAT ANY COLON TUBE W/FLUORO  04/15/2021   IR [place] GASTRO/COLONIC TUBE PERCUT W/FLUORO  03/25/2021   THORACIC [place]   pt states ruptured disc and had surgery to repair it     Family History: Family History  Problem Relation Age of Onset   CVA Father    Prostate cancer Neg Hx        not sure what family member     Social History: Social History   Socioeconomic History   Marital status: Single    Spouse name: Not on file   Number of children: Not on file   Years of education: Not on file   Highest education level: Not on file  Occupational History   Not on file  Tobacco Use   Smoking status: Former    Packs/day: 1.50    Years: 40.00    Pack years: 60.00    Types: Cigarettes    Quit  date: 07/05/2006    Years  since quitting: 14.9   Smokeless tobacco: Never  Substance and Sexual Activity   Alcohol use: No    Comment: quit in 1991   Drug use: No   Sexual activity: Not Currently    Birth control/protection: None  Other Topics Concern   Not on file  Social History Narrative   Not on file   Social Determinants of Health   Financial Resource Strain: Not on file  Food Insecurity: Not on file  Transportation Needs: Not on file  Physical Activity: Not on file  Stress: Not on file  Social Connections: Not on file  Intimate Partner Violence: Not on file     Review of Systems: Review of Systems  Constitutional:  Positive for malaise/fatigue and weight loss. Negative for chills and fever.  HENT:  Negative for congestion, hearing loss and tinnitus.   Eyes:  Negative for blurred vision and double vision.  Respiratory:  Positive for cough and shortness of breath. Negative for sputum production.   Cardiovascular:  Negative for chest pain, palpitations and orthopnea.  Gastrointestinal:  Negative for diarrhea, nausea and vomiting.  Genitourinary:  Negative for dysuria, frequency and urgency.  Musculoskeletal:  Negative for myalgias.  Skin:  Negative for itching and rash.  Neurological:  Positive for weakness. Negative for dizziness and focal weakness.  Endo/Heme/Allergies:  Negative for polydipsia. Does not bruise/bleed easily.  Psychiatric/Behavioral:  Negative for depression. The patient is not nervous/anxious.     Vital Signs: Blood pressure 127/77, pulse (!) 122, temperature 98.3 F (36.8 C), temperature source Oral, resp. rate (!) 28, height 5\' 7"  (1.702 m), weight 48.2 kg, SpO2 94 %.  Weight trends: Filed Weights   06/21/21 1239 06/22/21 0331  Weight: 54.4 kg 48.2 kg     Physical Exam: General: Cachectic male  Head: Normocephalic, atraumatic. Moist oral mucosal membranes  Eyes: Anicteric  Neck: Supple  Lungs:  Basilar rales, on high flow nasal cannula  Heart: S1S2 no rubs   Abdomen:  Soft, nontender, bowel sounds present  Extremities: No peripheral edema.  Neurologic: Awake, alert, following commands  Skin: No acute rash  Access: No hemodialysis access    Lab results: Basic Metabolic Panel: Recent Labs  Lab 06/21/21 1244 06/22/21 0151 06/22/21 0545  NA 153* 157* 162*  K 4.4 2.5* 3.8  CL 115* >130* 123*  CO2 29 21* 31  GLUCOSE 407* 130* 131*  BUN 77* 45* 56*  CREATININE 1.52* 0.76 1.15  CALCIUM 9.9 6.4* 9.6    Liver Function Tests: Recent Labs  Lab 06/21/21 1244  AST 28  ALT 114*  ALKPHOS 101  BILITOT 0.8  PROT 8.8*  ALBUMIN 3.4*   No results for input(s): LIPASE, AMYLASE in the last 168 hours. No results for input(s): AMMONIA in the last 168 hours.  CBC: Recent Labs  Lab 06/21/21 1244 06/22/21 0545  WBC 11.4* 9.8  HGB 13.9 14.1  HCT 44.8 46.0  MCV 83.4 83.8  PLT 243 227    Cardiac Enzymes: No results for input(s): CKTOTAL, CKMB, CKMBINDEX, TROPONINI in the last 168 hours.  BNP: Invalid input(s): POCBNP  CBG: Recent Labs  Lab 06/21/21 1501 06/21/21 1702 06/21/21 2158 06/22/21 0328 06/22/21 0729  GLUCAP 349* 319* 231* 158* 124*    Microbiology: Results for orders placed or performed during the hospital encounter of 06/21/21  Resp Panel by RT-PCR (Flu A&B, Covid) Nasopharyngeal Swab     Status: None   Collection Time: 06/21/21  2:58 PM  Specimen: Nasopharyngeal Swab; Nasopharyngeal(NP) swabs in vial transport medium  Result Value Ref Range Status   SARS Coronavirus 2 by RT PCR NEGATIVE NEGATIVE Final    Comment: (NOTE) SARS-CoV-2 target nucleic acids are NOT DETECTED.  The SARS-CoV-2 RNA is generally detectable in upper respiratory specimens during the acute phase of infection. The lowest concentration of SARS-CoV-2 viral copies this assay can detect is 138 copies/mL. A negative result does not preclude SARS-Cov-2 infection and should not be used as the sole basis for treatment or other patient management  decisions. A negative result may occur with  improper specimen collection/handling, submission of specimen other than nasopharyngeal swab, presence of viral mutation(s) within the areas targeted by this assay, and inadequate number of viral copies(<138 copies/mL). A negative result must be combined with clinical observations, patient history, and epidemiological information. The expected result is Negative.  Fact Sheet for Patients:  EntrepreneurPulse.com.au  Fact Sheet for Healthcare Providers:  IncredibleEmployment.be  This test is no t yet approved or cleared by the [place] FDA and  has been authorized for detection and/or diagnosis of SARS-CoV-2 by FDA under an Emergency Use Authorization (EUA). This EUA will remain  in effect (meaning this test can be used) for the duration of the COVID-19 declaration under Section 564(b)(1) of the Act, 21 U.S.C.section 360bbb-3(b)(1), unless the authorization is terminated  or revoked sooner.       Influenza A by PCR NEGATIVE NEGATIVE Final   Influenza B by PCR NEGATIVE NEGATIVE Final    Comment: (NOTE) The Xpert Xpress SARS-CoV-2/FLU/RSV plus assay is intended as an aid in the diagnosis of influenza from Nasopharyngeal swab specimens and should not be used as a sole basis for treatment. Nasal washings and aspirates are unacceptable for Xpert Xpress SARS-CoV-2/FLU/RSV testing.  Fact Sheet for Patients: EntrepreneurPulse.com.au  Fact Sheet for Healthcare Providers: IncredibleEmployment.be  This test is not yet approved or cleared by the [place] FDA and has been authorized for detection and/or diagnosis of SARS-CoV-2 by FDA under an Emergency Use Authorization (EUA). This EUA will remain in effect (meaning this test can be used) for the duration of the COVID-19 declaration under Section 564(b)(1) of the Act, 21 U.S.C. section 360bbb-3(b)(1), unless the  authorization is terminated or revoked.  Performed at [facility], [address] [place], [place] [address]   Culture, blood (Routine X 2) w Reflex to ID Panel     Status: None (Preliminary result)   Collection Time: 06/21/21  2:58 PM   Specimen: BLOOD  Result Value Ref Range Status   Specimen Description BLOOD BLOOD RIGHT FOREARM  Final   Special Requests   Final    BOTTLES DRAWN AEROBIC AND ANAEROBIC Blood Culture results may not be optimal due to an inadequate volume of blood received in culture bottles   Culture   Final    NO GROWTH < 24 HOURS Performed at [facility] [address], [place], [place] [address]    Report Status PENDING  Incomplete  Culture, blood (Routine X 2) w Reflex to ID Panel     Status: None (Preliminary result)   Collection Time: 06/21/21  2:58 PM   Specimen: BLOOD  Result Value Ref Range Status   Specimen Description BLOOD LEFT ANTECUBITAL  Final   Special Requests   Final    BOTTLES DRAWN AEROBIC AND ANAEROBIC Blood Culture results may not be optimal due to an inadequate volume of blood received in culture bottles   Culture  Setup Time  Final    GRAM POSITIVE COCCI IN BOTH AEROBIC AND ANAEROBIC BOTTLES Organism ID to follow CRITICAL RESULT CALLED TO, READ BACK BY AND VERIFIED WITHOtto Herb [facility] [id number] 06/22/21 HNM Performed at [facility], [address] [place], [place] [address]    Culture [facility] POSITIVE COCCI  Final   Report Status PENDING  Incomplete  Blood Culture ID Panel (Reflexed)     Status: Abnormal   Collection Time: 06/21/21  2:58 PM  Result Value Ref Range Status   Enterococcus faecalis NOT DETECTED NOT DETECTED Final   Enterococcus Faecium NOT DETECTED NOT DETECTED Final   Listeria monocytogenes NOT DETECTED NOT DETECTED Final   Staphylococcus species DETECTED (A) NOT DETECTED Final    Comment: CRITICAL RESULT CALLED TO, READ BACK BY AND VERIFIED WITHOtto Herb PHARMD [id number] 06/22/21  HNM    Staphylococcus aureus (BCID) NOT DETECTED NOT DETECTED Final   Staphylococcus epidermidis NOT DETECTED NOT DETECTED Final   Staphylococcus lugdunensis NOT DETECTED NOT DETECTED Final   Streptococcus species NOT DETECTED NOT DETECTED Final   Streptococcus agalactiae NOT DETECTED NOT DETECTED Final   Streptococcus pneumoniae NOT DETECTED NOT DETECTED Final   Streptococcus pyogenes NOT DETECTED NOT DETECTED Final   A.calcoaceticus-baumannii NOT DETECTED NOT DETECTED Final   Bacteroides fragilis NOT DETECTED NOT DETECTED Final   Enterobacterales NOT DETECTED NOT DETECTED Final   Enterobacter cloacae complex NOT DETECTED NOT DETECTED Final   Escherichia coli NOT DETECTED NOT DETECTED Final   Klebsiella aerogenes NOT DETECTED NOT DETECTED Final   Klebsiella oxytoca NOT DETECTED NOT DETECTED Final   Klebsiella pneumoniae NOT DETECTED NOT DETECTED Final   Proteus species NOT DETECTED NOT DETECTED Final   Salmonella species NOT DETECTED NOT DETECTED Final   Serratia marcescens NOT DETECTED NOT DETECTED Final   Haemophilus influenzae NOT DETECTED NOT DETECTED Final   Neisseria meningitidis NOT DETECTED NOT DETECTED Final   Pseudomonas aeruginosa NOT DETECTED NOT DETECTED Final   Stenotrophomonas maltophilia NOT DETECTED NOT DETECTED Final   Candida albicans NOT DETECTED NOT DETECTED Final   Candida auris NOT DETECTED NOT DETECTED Final   Candida glabrata NOT DETECTED NOT DETECTED Final   Candida krusei NOT DETECTED NOT DETECTED Final   Candida parapsilosis NOT DETECTED NOT DETECTED Final   Candida tropicalis NOT DETECTED NOT DETECTED Final   Cryptococcus neoformans/gattii NOT DETECTED NOT DETECTED Final    Comment: Performed at [facility], [address] [place], [place] [address]  MRSA Next Gen by PCR, Nasal     Status: None   Collection Time: 06/21/21  6:25 PM   Specimen: Nasal Mucosa; Nasal Swab  Result Value Ref Range Status   MRSA by PCR Next Gen NOT DETECTED NOT  DETECTED Final    Comment: (NOTE) The GeneXpert MRSA Assay (FDA approved for NASAL specimens only), is one component of a comprehensive MRSA colonization surveillance program. It is not intended to diagnose MRSA infection nor to guide or monitor treatment for MRSA infections. Test performance is not FDA approved in patients less than 40 years old. Performed at [facility], [address] [place], [place] [address]     Coagulation Studies: No results for input(s): LABPROT, INR in the last 72 hours.  Urinalysis: Recent Labs    06/21/21 1619  COLORURINE YELLOW*  LABSPEC 1.026  PHURINE 5.0  GLUCOSEU >=500*  HGBUR MODERATE*  BILIRUBINUR NEGATIVE  KETONESUR NEGATIVE  PROTEINUR 30*  NITRITE NEGATIVE  LEUKOCYTESUR NEGATIVE      Imaging: DG Chest  2 View  Result Date: 06/21/2021 CLINICAL DATA:  Hypoxia.  Weakness. EXAM: CHEST - 2 VIEW COMPARISON:  06/09/2021 and older studies. FINDINGS: Irregular bronchial wall thickening in the right lower lobe with subtle intervening hazy airspace opacities. This is new from the prior chest radiograph. Lungs are hyperexpanded but otherwise clear. No pleural effusion or pneumothorax. Cardiac silhouette is normal in size. No mediastinal or hilar masses or evidence of adenopathy. Skeletal structures are intact. IMPRESSION: 1. Bronchial wall thickening with subtle intervening hazy airspace opacities in the right lower lobe. This is consistent with bronchopneumonia in the proper clinical setting. 2. No other evidence of acute cardiopulmonary disease. Electronically Signed   By: Lajean Manes M.D.   On: 06/21/2021 13:35   CT HEAD WO CONTRAST (5MM)  Result Date: 06/21/2021 CLINICAL DATA:  Patient himself somewhat poor historian but does identify the year is 2022 and is month of December. He cannot really give me a good history as to why he came today however family apparently on scene reported to EMS the patient having confusion with elevated  blood sugar. EMS blood sugar was initially greater than 500 nurses report he did receive IV fluids with EMS his blood sugar is now returned down into the high 300 range. Unknown when symptoms first started EXAM: CT HEAD WITHOUT CONTRAST TECHNIQUE: Contiguous axial images were obtained from the base of the skull through the vertex without intravenous contrast. COMPARISON:  06/05/2021. FINDINGS: Brain: No evidence of acute infarction, hemorrhage, hydrocephalus, extra-axial collection or mass lesion/mass effect. Ventricular and sulcal enlargement reflecting mild diffuse atrophy, unchanged. Small focus of hypoattenuation in the right parietal lobe consistent with an old infarct, also stable. Vascular: No hyperdense vessel or unexpected calcification. Skull: Normal. Negative for fracture or focal lesion. Sinuses/Orbits: Globes and orbits are unremarkable. Visualized sinuses are clear. Other: None. IMPRESSION: 1. No acute intracranial abnormalities. No change from the recent prior head CT. Electronically Signed   By: Lajean Manes M.D.   On: 06/21/2021 13:37   CT Angio Chest Pulmonary Embolism (PE) W or WO Contrast  Addendum Date: 06/22/2021   ADDENDUM REPORT: 06/22/2021 02:50 ADDENDUM: Discussed case over phone with Dr. Hal Hope at 2:46 a.m., 06/22/2021. CT abdomen and pelvis will be performed to assess for bowel perforation. Electronically Signed   By: Telford Nab M.D.   On: 06/22/2021 02:50   Result Date: 06/22/2021 CLINICAL DATA:  Increasing hypoxemia, suspected pulmonary thromboembolism. EXAM: CT ANGIOGRAPHY CHEST WITH CONTRAST TECHNIQUE: Multidetector CT imaging of the chest was performed using the standard protocol during bolus administration of intravenous contrast. Multiplanar CT image reconstructions and MIPs were obtained to evaluate the vascular anatomy. CONTRAST:  51mL OMNIPAQUE IOHEXOL 350 MG/ML SOLN COMPARISON:  CTA chest 03/12/2021 and chest CT no contrast 10/08/2015. FINDINGS: Cardiovascular:  There suboptimal bolus timing. No arterial embolus is seen through the segmental divisions but the subsegmental arteries are unopacified due to bolus timing. There is no arterial dilatation or IVC reflux and no right heart chamber expansion. The heart is normal in size and a small anterior pericardial effusion is again noted. There is no visible coronary artery calcification. There is mild aortic atherosclerosis without aneurysm or dissection with homogeneous enhancement in the normal caliber great vessels. The pulmonary veins are normal caliber. Mediastinum/Nodes: There are mildly enlarged, increasingly prominent right hilar nodes up to 1.4 cm in short axis probably reactive etiology. There are stable slightly prominent left hilar lymph nodes up to 1.1 cm in short axis. There is increasing prominence of subcarinal  nodes up to 1.2 cm in short axis: With precarinal nodal complex larger today at 1.1 cm short axis. No axillary or further mediastinal adenopathy is seen. There is a small hiatal hernia. No esophageal thickening or thyroid mass noted. Lungs/Pleura: Stable biapical scarring with bronchiolectasis. There is mucous plugging or aspirate in some of the bilateral lower lobe posterior basal second and third order bronchi. Aspirated material was previously noted in the right main bronchus. There is interval worsening of patchy dense consolidation in right lower lobe segments and increasing ground-glass and tree-in-bud infiltrates in a patchy distribution in the left lower lobe, with worsening tree-in-bud opacities peripherally in the right lower lobe and scattered in the posterior segment of the right upper lobe. Linear scar-like opacities are again noted both bases. Upper Abdomen: There are stones posteriorly in the gallbladder but no wall thickening. There is a small amount of scattered free intraperitoneal air anterior to the liver and trace free air within a small hiatal hernia to the right. There is trace free  air in the subphrenic space on the left. PEG tube in place was not seen on the last CT. Was this recently placed? Musculoskeletal: No chest wall abnormality. No acute or significant osseous findings. Review of the MIP images confirms the above findings. IMPRESSION: 1. No arterial embolus is seen through the segmental divisions. Subsegmental arteries are unopacified and not evaluated. 2. Mild increased prominence of right hilar and mediastinal lymph nodes, possibly reactive. No bulky or encasing adenopathy. 3. Bilateral lower lobe second and third order basilar bronchial mucous plugging or aspirate, with worsening opacities in both lower lobes most likely due to bronchopneumonia given the presence of tree-in-bud interstitial opacities and probably aspiration etiology. Additional tree-in-bud opacities have developed in the right upper lobe posterior segment further suggesting an aspiration pneumonitis. 4. Small pericardial effusion. 5. Scattered free air in the upper abdomen. Source indeterminate. If patient has not had a recent procedure such as a laparoscopic procedure or PEG placement, assessment for hollow viscus perforation must be made. 6. Mild aortic atherosclerosis. 7. Cholelithiasis. 8. Ordering physician will be contacted by PRA and this report will be addended at that time. Electronically Signed: By: Telford Nab M.D. On: 06/22/2021 02:41   CT ABDOMEN PELVIS W CONTRAST  Result Date: 06/22/2021 CLINICAL DATA:  Free air in the upper abdomen on CTA chest today. EXAM: CT ABDOMEN AND PELVIS WITH CONTRAST TECHNIQUE: Multidetector CT imaging of the abdomen and pelvis was performed using the standard protocol following bolus administration of intravenous contrast. CONTRAST:  46mL OMNIPAQUE IOHEXOL 300 MG/ML  SOLN COMPARISON:  CTA chest today, and CTs of abdomen and pelvis with contrast 02/16/2021 and 02/09/2020. FINDINGS: Factors affecting image quality: Abundant respiratory motion artifact. Lower chest:  Right greater than left lower lobe consolidative airspace disease and bilateral lower lobe bronchial mucus or fluid filling. Additional peripheral tree-in-bud infiltrates concerning for aspiration. Hepatobiliary: There are few scattered tiny hepatic hypodensities which are too small to characterize, calcified granuloma noted in the liver dome. No new abnormality is seen, no mass. There are layering stones in the posterior gallbladder but no wall thickening or biliary dilatation. Pancreas: No mass, adjacent edema, or ductal dilatation. Spleen: No mass or splenomegaly. Adrenals/Urinary Tract: There are bilateral renal cysts, largest is a stable thinly septated 3.3 cm cyst on the left. There is no renal or adrenal mass enhancement, no hydronephrosis is seen. Contrast partially opacifies the bladder which is normal in thickness. Stomach/Bowel: Small hiatal hernia. PEG tube not seen  previously. The first abdomen film on which this was reported was a flat plate single-view of 06/05/2021. The stomach is contracted around the balloon. There is no small bowel thickening or dilatation. The appendix is normal caliber. There are no findings of colitis or diverticulitis. Mild fecal stasis. Vascular/Lymphatic: Extensive aortoiliac calcific plaque, suspect at least 50-60% calcific stenosis distal right common iliac artery, suspected high-grade calcific stenoses in the proximal to mid internal iliac arteries. Suspected additional high-grade calcific stenosis proximal left external iliac artery. Reproductive: Enlarged prostate, measures 4.8 cm transversely, impresses on the inferior bladder. Other: There is no free fluid. There is a small volume of free air anterior to the liver, trace free air underneath the left hemidiaphragm but there is no free air adjacent the PEG tube. There is a small subcutaneous air locule in the left mid abdominal wall probably a site of recent injection. Musculoskeletal: Osteopenia and degenerative change  lumbar spine, including with prominent marginal osteophytes L5-S1 with anterior bridging, mild hip DJD. IMPRESSION: 1. Small amounts of free air in the right anterior upper abdomen, trace free air beneath the left hemidiaphragm. Source indeterminate. Unclear if related to a PEG tube leak or bowel perforation but there is no further free air. If the PEG was placed December 2, there should no longer be intraperitoneal free air from that procedure. The mesentery is unremarkable and does not show focal inflammatory changes. Consultation with a surgeon is recommended. 2. Right-greater-than-left lower lobe infiltrates and bronchial mucus or fluid filling. Aspiration precautions recommended. 3. Tiny hepatic hypodensities which are too small to characterize but unchanged. 4. Constipation and diverticulosis. 5. Heavy aortoiliac calcification with stenoses as above. 6. Renal cysts including a thinly septated left renal cyst. 7. Prostatomegaly. 8. Cholelithiasis. Electronically Signed   By: Telford Nab M.D.   On: 06/22/2021 03:50   DG ABDOMEN PEG TUBE LOCATION  Result Date: 06/22/2021 CLINICAL DATA:  Peg tube verification EXAM: ABDOMEN - 1 VIEW COMPARISON:  Abdominal CT from earlier today FINDINGS: Percutaneous gastrostomy tube injection opacifies proximal small bowel. Catheter and retention balloon were at the stomach by prior CT. Urinary collecting system contrast from prior CT. Known pulmonary infiltrates at the bases. IMPRESSION: Located percutaneous gastrostomy tube. No extravasation of the injected contrast. Electronically Signed   By: Jorje Guild M.D.   On: 06/22/2021 06:48   DG Chest Port 1 View  Result Date: 06/21/2021 CLINICAL DATA:  Dyspnea EXAM: PORTABLE CHEST 1 VIEW COMPARISON:  06/21/2021, 06/09/2021 FINDINGS: Interval progression of bibasilar pulmonary infiltrate, more focal within the right lower lobe, in keeping with multifocal infection or aspiration in the acute setting. No pneumothorax or  pleural effusion. Cardiac size within normal limits. Pulmonary vascularity is normal. No acute bone abnormality. IMPRESSION: Progressive bibasilar pulmonary infiltrate, more extensive within the right lung base. Electronically Signed   By: Fidela Salisbury M.D.   On: 06/21/2021 22:58     Assessment & Plan: Pt is a 70 y.o. male with a PMHx of oropharyngeal cancer status post G-tube placement, diabetes mellitus type 2, hyperlipidemia, GERD, former tobacco abuse, who was admitted to [facility] on 06/21/2021 for evaluation of cough as well as shortness of breath.    1.  Severe hypernatremia.  Serum sodium up to 162.  We will proceed with D5W at 125 cc/h.  Continue to monitor serum sodium closely.  2.  Acute kidney injury.  Baseline creatinine appears to be 0.7.  BUN now up to 56 with a creatinine of 1.15.  We are initiating  hydration as above.  3.  Acute respiratory failure secondary to pneumonia.  Patient currently on high flow nasal cannula.  He has bibasilar infiltrates.  There is concern for aspiration pneumonia.  Antibiotic treatment as per hospitalist.

## 2021-06-22 NOTE — Progress Notes (Signed)
Initial Nutrition Assessment  DOCUMENTATION CODES:   Severe malnutrition in context of chronic illness  INTERVENTION:   Jevity 1.5- Give 6 cartons daily via tube- Flush with 36m of water before and after each feed.    Regimen provides 2130kcal/day, 91g/day protein and 16875mday free water.   Pt at high refeed risk; recommend monitor potassium, magnesium and phosphorus labs daily until stable  NUTRITION DIAGNOSIS:   Severe Malnutrition related to cancer and cancer related treatments as evidenced by severe muscle depletion, severe fat depletion, percent weight loss.  GOAL:   Patient will meet greater than or equal to 90% of their needs  MONITOR:   Labs, Weight trends, TF tolerance, Skin, I & O's  REASON FOR ASSESSMENT:   Consult Enteral/tube feeding initiation and management  ASSESSMENT:   [age].o. 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 RD familiar with this pt from a recent previous admit. Pt s/p G-tube placement in September 2022 r/t dysphagia; this was recently replaced r/t malfunction. Last admission, pt was noted to have lost 13lbs(9%) since having his tube placed in September; this is significant weight loss. Pt is now noted to be down ~20lbs from his last admission. During his last admission, pt reported that he had been giving himself Ensure as he was not receiving tube feeds supplies to his house. It was discovered that pt has to call [employer] he is in need of more supplies. Today, pt reports that he is doing 6 cans of Jevity daily but daughter reports that pt has continued to do Ensure. Pt is requesting to have 3 cartons in the morning and 3 cartons in the evening along with 25061mf water BID; if pt is following this regimen at home, this would put him at high  risk for aspiration as this is over 1.0L of volume at a time. Ensure also does not provide enough calories to meet his estimated needs. Spoke with MD, pt's daughter has plans to move in with pt and help him out. MD to discuss tube feed situation with pt's daughter. Pt remains at high refeed risk.   Medications reviewed and include: lovenox, pepcid, insulin, unasyn, 5% dextrose _0 /hr   Labs reviewed: Na 162(H), K 3.8 wnl, BUN 56(H) Cbgs- 150, 124, 158 x 24 hrs AIC 6.8(H)- 8/1  Nutrition Focused Physical Exam:  Flowsheet Row Most Recent Value  Orbital Region Moderate depletion  Upper Arm Region Severe depletion  Thoracic and Lumbar Region Severe depletion  Buccal Region Moderate depletion  Temple Region Severe depletion  Clavicle Bone Region Severe depletion  Clavicle and Acromion Bone Region Severe depletion  Scapular Bone Region Severe depletion  Dorsal Hand Severe depletion  Patellar Region Severe depletion  Anterior Thigh Region Severe depletion  Posterior Calf Region Severe depletion  Edema (RD Assessment) None  Hair Reviewed  Eyes Reviewed  Mouth Reviewed  Skin Reviewed  Nails Reviewed   Diet Order:   Diet Order             Diet NPO time specified  Diet effective now                  EDUCATION NEEDS:   Education needs have been addressed  Skin:  Skin Assessment: Reviewed RN Assessment (ecchymosis)  Last BM:  12/17  Height:  Ht Readings from Last 1 Encounters:  06/22/21 _0  (1.702 m)    Weight:   Wt Readings from Last 1 Encounters:  06/22/21 48.2 kg    Ideal Body Weight:  61.8 kg  BMI:  Body mass index is 16.64 kg/m.  Estimated Nutritional Needs:   Kcal:  1800-2100kcal/day  Protein:  90-105  Fluid:  1.5-1.7L/day  Koleen Distance MS, RD, LDN Please refer to [facility] for RD and/or RD on-call/weekend/after hours pager

## 2021-06-22 NOTE — Progress Notes (Signed)
06/22/21  Patient evaluated today and the patient's G tube has remained in good position.  Denies any abdominal pain.  His abdomen is soft, non-distended, non-tender to palpation.  No leakage noted around G tube.  OK from surgical standpoint to use again.  The tube should be at about 3 cm mark at the silicone bumper.  Please let us know if any issues in the future.  Olean Ree, MD

## 2021-06-22 NOTE — ED Notes (Addendum)
Tonye Royalty NP updated with Lab values of CA 6.4, K of 2.5, and Cl > 30. Pt is on Heated high flow [place] FiO2 98% Flow of 55 lpm. A&Ox4. Skin WDL per ethnicity. Sinus Tach HR 120. BP 122/80 MAP 93. RR even and nonlabored.

## 2021-06-22 NOTE — Consult Note (Signed)
[facility] SURGICAL CONSULTATION NOTE    HISTORY OF PRESENT ILLNESS (HPI):  70 y.o. male was admitted yesterday with acute hypoxemic respiratory failure 2/2 aspiration pneumonia.  His past medical history significant for oropharyngeal cancer in 2008 requiring G-tube placement, DM, GERD, HLD, and former smoker.  His chest x-ray upon initial evaluation, demonstrated right lower lobe infiltrate concerning for aspiration pneumonia.  Overnight, the patient became increasingly hypoxic requiring nonrebreather.  He also became more tachycardic into the 120s.  CTA of the chest was obtained to evaluate for PE.  On this study, small amounts of pneumoperitoneum were noted.  CT of the abdomen and pelvis was obtained, which demonstrated small areas of pneumoperitoneum just anterior to the liver, within small hiatal hernia, and in the left subphrenic space.  CT scan did not demonstrate any evidence of inflammation of the stomach, small intestine, or large intestine. G-tube was placed in September 2022 by radiology, and per the note, the patient had another G-tube already present.  Patient also required replacement of PEG tube in the ED on 12/2.  Information was difficult to obtain from patient secondary to inappropriately fitting dentures.  He denies nausea, vomiting, and abdominal pain.  Other than his feeding tube, he denies history of surgery on his abdomen.  Surgery is consulted by Dr. Hal Hope in this context for evaluation of pneumoperitoneum.  PAST MEDICAL HISTORY (PMH):  Past Medical History:  Diagnosis Date   Arthritis    per pt. I do not have md notes that indicate dx.   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 Authorizing Provider  acetaminophen (TYLENOL) 325 MG tablet Take 650 mg by mouth every 6 (six) hours as needed for headache or mild pain.   Yes [provider]  albuterol (PROVENTIL HFA;VENTOLIN HFA) 108 (90 Base) MCG/ACT inhaler Inhale 2 puffs into the lungs every 6 (six) hours as needed for wheezing.   Yes [provider]  albuterol (PROVENTIL) (2.5 MG/3ML) 0.083% nebulizer solution Inhale 3 mLs into the lungs every 6 (six) hours as needed for wheezing. 06/09/21  Yes Swayze, Ava, DO  guaiFENesin (MUCINEX) 600 MG 12 hr tablet Take 2 tablets (1,200 mg total) by mouth 2 (two) times daily. 06/09/21 06/09/22 Yes Swayze, Ava, DO  omeprazole (PRILOSEC) 40 MG capsule Take 40 mg by mouth daily. 06/06/20  Yes [provider]  TOUJEO SOLOSTAR 300 UNIT/ML Solostar Pen Inject 15 Units into the skin daily. Patient taking differently: Inject 25 Units into the skin at bedtime. 07/21/20  Yes Dahal, Marlowe Aschoff, MD  Water For Irrigation, Sterile (FREE WATER) SOLN Place 100 mLs into feeding tube 6 (six) times daily. 03/21/21  Yes Wouk, Ailene Rud, MD  atorvastatin (LIPITOR) 20 MG tablet Place 1 tablet (20 mg total) into feeding tube daily. 03/21/21   Wouk, Ailene Rud, MD  famotidine (PEPCID) 40  MG tablet Place 1 tablet (40 mg total) into feeding tube daily. 03/21/21   Wouk, Ailene Rud, MD  metoprolol succinate (TOPROL-XL) 25 MG 24 hr tablet Take 0.5 tablets (12.5 mg total) by mouth daily. 06/10/21   Swayze, Ava, DO  Nutritional Supplements (FEEDING SUPPLEMENT, JEVITY 1.5 CAL/FIBER,) LIQD Place 237 mLs into feeding tube 6 (six) times daily. 06/09/21   Swayze, Ava, DO     ALLERGIES:  No  Known Allergies   SOCIAL HISTORY:  Social History   Socioeconomic History   Marital status: Single    Spouse name: Not on file   Number of children: Not on file   Years of education: Not on file   Highest education level: Not on file  Occupational History   Not on file  Tobacco Use   Smoking status: Former    Packs/day: 1.50    Years: 40.00    Pack years: 60.00    Types: Cigarettes    Quit date: 07/05/2006    Years since quitting: 14.9   Smokeless tobacco: Never  Substance and Sexual Activity   Alcohol use: No    Comment: quit in 1991   Drug use: No   Sexual activity: Not Currently    Birth control/protection: None  Other Topics Concern   Not on file  Social History Narrative   Not on file   Social Determinants of Health   Financial Resource Strain: Not on file  Food Insecurity: Not on file  Transportation Needs: Not on file  Physical Activity: Not on file  Stress: Not on file  Social Connections: Not on file  Intimate Partner Violence: Not on file     FAMILY HISTORY:  Family History  Problem Relation Age of Onset   CVA Father    Prostate cancer Neg Hx        not sure what family member      REVIEW OF SYSTEMS:  Review of Systems  Constitutional:  Negative for chills and fever.  Respiratory:  Negative for shortness of breath and wheezing.   Cardiovascular:  Negative for chest pain.  Gastrointestinal:  Negative for abdominal pain, nausea and vomiting.   VITAL SIGNS:  Temp:  [97.5 F (36.4 C)-99.2 F (37.3 C)] 97.5 F (36.4 C) (12/19 0331) Pulse Rate:  [99-127] 115 (12/19 0600) Resp:  [18-30] 30 (12/19 0600) BP: (108-159)/(75-103) 113/76 (12/19 0600) SpO2:  [83 %-100 %] 100 % (12/19 0600) FiO2 (%):  [98 %] 98 % (12/19 0331) Weight:  [48.2 kg-54.4 kg] 48.2 kg (12/19 0331)     Height: 5\' 7"  (170.2 cm) Weight: 48.2 kg BMI (Calculated): 16.64   INTAKE/OUTPUT:  12/18 0701 - 12/19 0700 In: 2289.6 [I.V.:502.5; NG/GT:487; IV Piggyback:1300.1] Out: 283  [Urine:475]  PHYSICAL EXAM:  Physical Exam Constitutional:      General: He is not in acute distress.    Appearance: Normal appearance. He is not diaphoretic.  HENT:     Head: Normocephalic and atraumatic.  Eyes:     Extraocular Movements: Extraocular movements intact.     Pupils: Pupils are equal, round, and reactive to light.  Cardiovascular:     Rate and Rhythm: Regular rhythm. Tachycardia present.  Pulmonary:     Comments: High flow nasal cannula in place Abdominal:     Comments: Abdomen soft, scaphoid, nondistended, no tenderness to percussion, no tenderness to palpation; no rigidity, guarding, or rebound tenderness.  PEG tube in left upper quadrant, bolster initially at 6 cm upon evaluation, able to be tightened to  3 cm; no signs of erythema or skin irritation surrounding PEG tube  Musculoskeletal:     Right lower leg: No edema.     Left lower leg: No edema.  Skin:    General: Skin is warm and dry.  Neurological:     Mental Status: He is alert.  Psychiatric:        Mood and Affect: Mood normal.        Behavior: Behavior normal.     Labs:  CBC Latest Ref Rng & Units 06/22/2021 06/21/2021 06/09/2021  WBC 4.0 - 10.5 K/uL 9.8 11.4(H) 3.2(L)  Hemoglobin 13.0 - 17.0 g/dL 14.1 13.9 11.5(L)  Hematocrit 39.0 - 52.0 % 46.0 44.8 36.0(L)  Platelets 150 - 400 K/uL 227 243 232   CMP Latest Ref Rng & Units 06/22/2021 06/22/2021 06/21/2021  Glucose 70 - 99 mg/dL 131(H) 130(H) 407(H)  BUN 8 - 23 mg/dL 56(H) 45(H) 77(H)  Creatinine 0.61 - 1.24 mg/dL 1.15 0.76 1.52(H)  Sodium 135 - 145 mmol/L 162(HH) 157(H) 153(H)  Potassium 3.5 - 5.1 mmol/L 3.8 2.5(LL) 4.4  Chloride 98 - 111 mmol/L 123(H) >130(HH) 115(H)  CO2 22 - 32 mmol/L 31 21(L) 29  Calcium 8.9 - 10.3 mg/dL 9.6 6.4(LL) 9.9  Total Protein 6.5 - 8.1 g/dL - - 8.8(H)  Total Bilirubin 0.3 - 1.2 mg/dL - - 0.8  Alkaline Phos 38 - 126 U/L - - 101  AST 15 - 41 U/L - - 28  ALT 0 - 44 U/L - - 114(H)    Imaging studies:  CT  ANGIOGRAPHY CHEST WITH CONTRAST IMPRESSION: 1. No arterial embolus is seen through the segmental divisions. Subsegmental arteries are unopacified and not evaluated. 2. Mild increased prominence of right hilar and mediastinal lymph nodes, possibly reactive. No bulky or encasing adenopathy. 3. Bilateral lower lobe second and third order basilar bronchial mucous plugging or aspirate, with worsening opacities in both lower lobes most likely due to bronchopneumonia given the presence of tree-in-bud interstitial opacities and probably aspiration etiology. Additional tree-in-bud opacities have developed in the right upper lobe posterior segment further suggesting an aspiration pneumonitis. 4. Small pericardial effusion. 5. Scattered free air in the upper abdomen. Source indeterminate. If patient has not had a recent procedure such as a laparoscopic procedure or PEG placement, assessment for hollow viscus perforation must be made. 6. Mild aortic atherosclerosis. 7. Cholelithiasis. 8. Ordering physician will be contacted by PRA and this report will be addended at that time.  CT ABDOMEN AND PELVIS WITH CONTRAST IMPRESSION: 1. Small amounts of free air in the right anterior upper abdomen, trace free air beneath the left hemidiaphragm. Source indeterminate. Unclear if related to a PEG tube leak or bowel perforation but there is no further free air. If the PEG wasplacedDecembe 2, there should no longer be intraperitoneal free air from that procedure. The mesentery is unremarkable and does not show focal inflammatory changes. Consultation with a surgeon is recommended. 2. Right-greater-than-left lower lobe infiltrates and bronchial mucus or fluid filling. Aspiration precautions recommended. 3. Tiny hepatic hypodensities which are too small to characterize but unchanged. 4. Constipation and diverticulosis. 5. Heavy aortoiliac calcification with stenoses as above. 6. Renal cysts including a  thinly septated left renal cyst. 7. Prostatomegaly. 8. Cholelithiasis.  ABDOMEN - 1 VIEW, PEG TUBE LOCATION IMPRESSION: Located percutaneous gastrostomy tube. No extravasation of the injected contrast.  Assessment/Plan:  70 y.o. male admitted with acute hypoxemic respiratory failure for aspiration pneumonia; pneumoperitoneum noted on CTA chest, and redemonstrated on CT abdomen  and pelvis, complicated by pertinent comorbidities including G-tube placement in September and replacement on 12/2, former smoker, and DM.   -Upon evaluation of CT abdomen pelvis, small areas of pneumoperitoneum present.  PEG tube noted to not be flush with abdominal wall on this imaging study.  No other inflammatory changes of small or large bowel noted to suggest possible source for perforation  -Patient with benign abdominal exam on evaluation.  ICU nurse advised that bolster was not flush to skin on her initial evaluation, so she tightened the bolster.  I was able to further tighten the bolster on the PEG tube down to 3 cm  -Given this patient's history of oropharyngeal carcinoma, I wonder if he has not formed an adequate, mature tract between his stomach and the abdominal wall.  His PEG tube has also been manipulated multiple times, and if it has not remained flush with the abdominal wall, it would be difficult for the tract to fully mature in a immunocompromised patient.  At this time, my strongest suspicion is that a small amount of air leaked somewhere along this track when the tube was not flush to his abdominal wall.  -Will conservatively manage the patient at this time, given his very benign abdominal exam and associated aspiration pneumonia requiring high flow nasal cannula  -Plan for serial abdominal examinations to monitor for changes  -No plan for emergent surgery at this time  -Continue to hold tube feeds at this time  -Remainder of care per primary and ICU teams  All of the above findings and  recommendations were discussed with the patient, and all of patient's questions were answered to his expressed satisfaction.  Thank you for the opportunity to participate in this patient's care.   -- Graciella Freer, DO

## 2021-06-22 NOTE — Progress Notes (Signed)
PHARMACY - PHYSICIAN COMMUNICATION CRITICAL VALUE ALERT - BLOOD CULTURE IDENTIFICATION (BCID)  Louis Stone is an 70 y.o. male who presented to [facility] on 06/21/2021 with a chief complaint of shortness of breath and couth.   Assessment:  Blood cultures from 12/18 with GPC in 1 of 4 bottles.  BCID detected staphylococcus species (Not S aureus or epidermidis).  On antibiotics for pneumonia  Name of physician (or Provider) Contacted: Dr Fritzi Mandes  Current antibiotics: ampicillin/sulbactam  Changes to prescribed antibiotics recommended:  Likely contaminant - further evaluation pending  Results for orders placed or performed during the hospital encounter of 06/21/21  Blood Culture ID Panel (Reflexed) (Collected: 06/21/2021  2:58 PM)  Result Value Ref Range   Enterococcus faecalis NOT DETECTED NOT DETECTED   Enterococcus Faecium NOT DETECTED NOT DETECTED   Listeria monocytogenes NOT DETECTED NOT DETECTED   Staphylococcus species DETECTED (A) NOT DETECTED   Staphylococcus aureus (BCID) NOT DETECTED NOT DETECTED   Staphylococcus epidermidis NOT DETECTED NOT DETECTED   Staphylococcus lugdunensis NOT DETECTED NOT DETECTED   Streptococcus species NOT DETECTED NOT DETECTED   Streptococcus agalactiae NOT DETECTED NOT DETECTED   Streptococcus pneumoniae NOT DETECTED NOT DETECTED   Streptococcus pyogenes NOT DETECTED NOT DETECTED   A.calcoaceticus-baumannii NOT DETECTED NOT DETECTED   Bacteroides fragilis NOT DETECTED NOT DETECTED   Enterobacterales NOT DETECTED NOT DETECTED   Enterobacter cloacae complex NOT DETECTED NOT DETECTED   Escherichia coli NOT DETECTED NOT DETECTED   Klebsiella aerogenes NOT DETECTED NOT DETECTED   Klebsiella oxytoca NOT DETECTED NOT DETECTED   Klebsiella pneumoniae NOT DETECTED NOT DETECTED   Proteus species NOT DETECTED NOT DETECTED   Salmonella species NOT DETECTED NOT DETECTED   Serratia marcescens NOT DETECTED NOT DETECTED   Haemophilus influenzae  NOT DETECTED NOT DETECTED   Neisseria meningitidis NOT DETECTED NOT DETECTED   Pseudomonas aeruginosa NOT DETECTED NOT DETECTED   Stenotrophomonas maltophilia NOT DETECTED NOT DETECTED   Candida albicans NOT DETECTED NOT DETECTED   Candida auris NOT DETECTED NOT DETECTED   Candida glabrata NOT DETECTED NOT DETECTED   Candida krusei NOT DETECTED NOT DETECTED   Candida parapsilosis NOT DETECTED NOT DETECTED   Candida tropicalis NOT DETECTED NOT DETECTED   Cryptococcus neoformans/gattii NOT DETECTED NOT DETECTED    Doreene Eland, PharmD, BCPS, BCIDP Work Cell: [phone] 06/22/2021 8:29 AM

## 2021-06-22 NOTE — Progress Notes (Signed)
Placed patient on maxiflow HFNC 98% 55L after obtaining abg. Patient tolerated interventions well.

## 2021-06-22 NOTE — ED Notes (Signed)
Dr. Hal Hope and ICU NP at bedside.

## 2021-06-22 NOTE — ED Notes (Signed)
Patient oxygen saturations noted to be 84% on RA. A&Ox4. Skin WDL per ethnicity. Answering questions appropriately. RR even and non-labored. Following commands. Placed nasal cannula, switched to NRB with attempt to bring oxygen sats > 90%. Administering albuterol neb. Dr. Hal Hope notified of patient change. Advised Dr. To come see patient. Sinus Tach. HR low 100's SBP > 90.

## 2021-06-22 NOTE — Significant Event (Addendum)
I was notified by patient's nurse that patient was getting more hypoxic.  On exam at bedside patient was hypoxic requiring nonrebreather to maintain sats around 92%.  Patient was not tachypneic did not complain of any chest pain or shortness of breath.  Blood pressure was 159/100 pulse 120/min sinus tachycardia temperature 99.2.  Ordered repeat chest x-ray stat which shows worsening infiltrates.  Reviewed patient's medication labs and notes.  Plan is to place patient on nebulizer and high flow oxygen.  We will hold IV fluids for now.  We will also get a CT angiogram of the chest to rule out pulmonary embolism.  Consulted pulmonary critical care.  Will place patient on BiPAP if saturation does not improve.  Addendum -patient's CT angiogram of the chest was showing pneumonia concerning for aspiration and was negative for for pulmonary embolism.  Radiologist discussed with me stating that there was an air under the diaphragm.  Did a stat CT scan abdomen pelvis which still shows the air under the diaphragm.  Patient did not complain of any abdominal pain does have a PEG tube.  Reviewed patient's chart patient did have PEG tube changes around June 05, 2021 which as per the radiologist should not be accounting for the air under the diaphragm.  Consulted with on-call general surgeon Dr. Derryl Harbor further recommendation.  Patient will be kept n.p.o. until then.  Gean Birchwood

## 2021-06-22 NOTE — Progress Notes (Signed)
Patient's brother called and wanted an update.  Got okay from patient to give update to brother.

## 2021-06-22 NOTE — Plan of Care (Signed)
Discussed with patient plan of care for the shift, pain management, external male catheter and admission questions with some teach back displayed.  Patient mumbles @ times with dentures.  Problem: Education: Goal: Knowledge of General Education information will improve Description: Including pain rating scale, medication(s)/side effects and non-pharmacologic comfort measures Outcome: Progressing   Problem: Nutrition: Goal: Adequate nutrition will be maintained Outcome: Progressing   Problem: Elimination: Goal: Will not experience complications related to bowel motility Outcome: Progressing

## 2021-06-22 NOTE — Progress Notes (Signed)
Please hold all free water and tube feeding until abdomen is re-examed later today.

## 2021-06-22 NOTE — Plan of Care (Signed)

## 2021-06-22 NOTE — Progress Notes (Signed)
Louis Stone    MR#:  [id number]  DATE OF BIRTH:  Nov 30, 1950  SUBJECTIVE:  patient came in with increasing shortness of breath and weakness. 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  HENT:  Negative for ear discharge, ear pain and nosebleeds.   Eyes:  Negative for blurred vision, pain and discharge.  Respiratory:  Positive for shortness of breath. Negative for sputum production, wheezing and stridor.   Cardiovascular:  Negative for chest pain, palpitations, orthopnea and PND.  Gastrointestinal:  Negative for abdominal pain, diarrhea, nausea and vomiting.  Genitourinary:  Negative for frequency and urgency.  Musculoskeletal:  Negative for back pain and joint pain.  Neurological:  Positive for weakness. Negative for sensory change, speech change and focal weakness.  Psychiatric/Behavioral:  Negative for depression and hallucinations. The patient is not nervous/anxious.   Tolerating Diet:npo Tolerating PT: pending  DRUG ALLERGIES:  No Known Allergies  VITALS:  Blood pressure 119/80, pulse (!) 111, temperature 98.2 F (36.8 C), temperature source Oral, resp. rate (!) 40, height 5\' 7"  (1.702 m), weight 48.2 kg, SpO2 97 %.  PHYSICAL EXAMINATION:   Physical Exam  GENERAL:  70 y.o.-year-old patient lying in the bed with no acute distress. Thin cachectic HEENT: Head atraumatic, normocephalic. HFNC + LUNGS: decreased breath sounds bilaterally, no wheezing, rales, rhonchi. No use of accessory muscles of respiration.  CARDIOVASCULAR: S1, S2 normal. No murmurs, rubs, or gallops. tachycardia ABDOMEN: Soft, nontender, nondistended. Bowel sounds present. No organomegaly or mass.  PEG+ EXTREMITIES: No cyanosis, clubbing  or edema b/l.    NEUROLOGIC: nonfocal PSYCHIATRIC:  patient is alert  SKIN: No obvious rash, lesion, or ulcer.   LABORATORY PANEL:  CBC Recent Labs  Lab 06/22/21 0545  WBC 9.8  HGB 14.1  HCT 46.0  PLT 227    Chemistries  Recent Labs  Lab 06/21/21 1244 06/22/21 0151 06/22/21 0545  NA 153*   < > 162*  K 4.4   < > 3.8  CL 115*   < > 123*  CO2 29   < > 31  GLUCOSE 407*   < > 131*  BUN 77*   < > 56*  CREATININE 1.52*   < > 1.15  CALCIUM 9.9   < > 9.6  AST 28  --   --   ALT 114*  --   --   ALKPHOS 101  --   --   BILITOT 0.8  --   --    < > = values in this interval not displayed.   Cardiac Enzymes No results for input(s): TROPONINI in the last 168 hours. RADIOLOGY:  DG Chest 2 View  Result Date: 06/21/2021 CLINICAL DATA:  Hypoxia.  Weakness. EXAM: CHEST - 2 VIEW COMPARISON:  06/09/2021 and older studies. FINDINGS: Irregular bronchial wall thickening in the right lower lobe with subtle intervening hazy airspace opacities. This is new from the prior chest radiograph. Lungs are hyperexpanded but otherwise clear. No pleural effusion or pneumothorax. Cardiac silhouette is normal in size. No mediastinal or hilar masses or evidence of adenopathy. Skeletal structures are intact. IMPRESSION: 1. Bronchial wall thickening with subtle intervening hazy airspace opacities in the right lower lobe. This is consistent with bronchopneumonia in the proper clinical  setting. 2. No other evidence of acute cardiopulmonary disease. Electronically Signed   By: Lajean Manes M.D.   On: 06/21/2021 13:35   CT HEAD WO CONTRAST (5MM)  Result Date: 06/21/2021 CLINICAL DATA:  Patient himself somewhat poor historian but does identify the year is 2022 and is month of December. He cannot really give me a good history as to why he came today however family apparently on scene reported to EMS the patient having confusion with elevated blood sugar. EMS blood sugar was initially greater than 500 nurses report he  did receive IV fluids with EMS his blood sugar is now returned down into the high 300 range. Unknown when symptoms first started EXAM: CT HEAD WITHOUT CONTRAST TECHNIQUE: Contiguous axial images were obtained from the base of the skull through the vertex without intravenous contrast. COMPARISON:  06/05/2021. FINDINGS: Brain: No evidence of acute infarction, hemorrhage, hydrocephalus, extra-axial collection or mass lesion/mass effect. Ventricular and sulcal enlargement reflecting mild diffuse atrophy, unchanged. Small focus of hypoattenuation in the right parietal lobe consistent with an old infarct, also stable. Vascular: No hyperdense vessel or unexpected calcification. Skull: Normal. Negative for fracture or focal lesion. Sinuses/Orbits: Globes and orbits are unremarkable. Visualized sinuses are clear. Other: None. IMPRESSION: 1. No acute intracranial abnormalities. No change from the recent prior head CT. Electronically Signed   By: Lajean Manes M.D.   On: 06/21/2021 13:37   CT Angio Chest Pulmonary Embolism (PE) W or WO Contrast  Addendum Date: 06/22/2021   ADDENDUM REPORT: 06/22/2021 02:50 ADDENDUM: Discussed case over phone with Dr. Hal Hope at 2:46 a.m., 06/22/2021. CT abdomen and pelvis will be performed to assess for bowel perforation. Electronically Signed   By: Telford Nab M.D.   On: 06/22/2021 02:50   Result Date: 06/22/2021 CLINICAL DATA:  Increasing hypoxemia, suspected pulmonary thromboembolism. EXAM: CT ANGIOGRAPHY CHEST WITH CONTRAST TECHNIQUE: Multidetector CT imaging of the chest was performed using the standard protocol during bolus administration of intravenous contrast. Multiplanar CT image reconstructions and MIPs were obtained to evaluate the vascular anatomy. CONTRAST:  20mL OMNIPAQUE IOHEXOL 350 MG/ML SOLN COMPARISON:  CTA chest 03/12/2021 and chest CT no contrast 10/08/2015. FINDINGS: Cardiovascular: There suboptimal bolus timing. No arterial embolus is seen through the  segmental divisions but the subsegmental arteries are unopacified due to bolus timing. There is no arterial dilatation or IVC reflux and no right heart chamber expansion. The heart is normal in size and a small anterior pericardial effusion is again noted. There is no visible coronary artery calcification. There is mild aortic atherosclerosis without aneurysm or dissection with homogeneous enhancement in the normal caliber great vessels. The pulmonary veins are normal caliber. Mediastinum/Nodes: There are mildly enlarged, increasingly prominent right hilar nodes up to 1.4 cm in short axis probably reactive etiology. There are stable slightly prominent left hilar lymph nodes up to 1.1 cm in short axis. There is increasing prominence of subcarinal nodes up to 1.2 cm in short axis: With precarinal nodal complex larger today at 1.1 cm short axis. No axillary or further mediastinal adenopathy is seen. There is a small hiatal hernia. No esophageal thickening or thyroid mass noted. Lungs/Pleura: Stable biapical scarring with bronchiolectasis. There is mucous plugging or aspirate in some of the bilateral lower lobe posterior basal second and third order bronchi. Aspirated material was previously noted in the right main bronchus. There is interval worsening of patchy dense consolidation in right lower lobe segments and increasing ground-glass and tree-in-bud infiltrates in a patchy distribution in the left  lower lobe, with worsening tree-in-bud opacities peripherally in the right lower lobe and scattered in the posterior segment of the right upper lobe. Linear scar-like opacities are again noted both bases. Upper Abdomen: There are stones posteriorly in the gallbladder but no wall thickening. There is a small amount of scattered free intraperitoneal air anterior to the liver and trace free air within a small hiatal hernia to the right. There is trace free air in the subphrenic space on the left. PEG tube in place was not  seen on the last CT. Was this recently placed? Musculoskeletal: No chest wall abnormality. No acute or significant osseous findings. Review of the MIP images confirms the above findings. IMPRESSION: 1. No arterial embolus is seen through the segmental divisions. Subsegmental arteries are unopacified and not evaluated. 2. Mild increased prominence of right hilar and mediastinal lymph nodes, possibly reactive. No bulky or encasing adenopathy. 3. Bilateral lower lobe second and third order basilar bronchial mucous plugging or aspirate, with worsening opacities in both lower lobes most likely due to bronchopneumonia given the presence of tree-in-bud interstitial opacities and probably aspiration etiology. Additional tree-in-bud opacities have developed in the right upper lobe posterior segment further suggesting an aspiration pneumonitis. 4. Small pericardial effusion. 5. Scattered free air in the upper abdomen. Source indeterminate. If patient has not had a recent procedure such as a laparoscopic procedure or PEG placement, assessment for hollow viscus perforation must be made. 6. Mild aortic atherosclerosis. 7. Cholelithiasis. 8. Ordering physician will be contacted by [facility] and this report will be addended at that time. Electronically Signed: By: Telford Nab M.D. On: 06/22/2021 02:41   CT ABDOMEN PELVIS W CONTRAST  Result Date: 06/22/2021 CLINICAL DATA:  Free air in the upper abdomen on CTA chest today. EXAM: CT ABDOMEN AND PELVIS WITH CONTRAST TECHNIQUE: Multidetector CT imaging of the abdomen and pelvis was performed using the standard protocol following bolus administration of intravenous contrast. CONTRAST:  86mL OMNIPAQUE IOHEXOL 300 MG/ML  SOLN COMPARISON:  CTA chest today, and CTs of abdomen and pelvis with contrast 02/16/2021 and 02/09/2020. FINDINGS: Factors affecting image quality: Abundant respiratory motion artifact. Lower chest: Right greater than left lower lobe consolidative airspace disease and  bilateral lower lobe bronchial mucus or fluid filling. Additional peripheral tree-in-bud infiltrates concerning for aspiration. Hepatobiliary: There are few scattered tiny hepatic hypodensities which are too small to characterize, calcified granuloma noted in the liver dome. No new abnormality is seen, no mass. There are layering stones in the posterior gallbladder but no wall thickening or biliary dilatation. Pancreas: No mass, adjacent edema, or ductal dilatation. Spleen: No mass or splenomegaly. Adrenals/Urinary Tract: There are bilateral renal cysts, largest is a stable thinly septated 3.3 cm cyst on the left. There is no renal or adrenal mass enhancement, no hydronephrosis is seen. Contrast partially opacifies the bladder which is normal in thickness. Stomach/Bowel: Small hiatal hernia. PEG tube not seen previously. The first abdomen film on which this was reported was a flat plate single-view of 06/05/2021. The stomach is contracted around the balloon. There is no small bowel thickening or dilatation. The appendix is normal caliber. There are no findings of colitis or diverticulitis. Mild fecal stasis. Vascular/Lymphatic: Extensive aortoiliac calcific plaque, suspect at least 50-60% calcific stenosis distal right common iliac artery, suspected high-grade calcific stenoses in the proximal to mid internal iliac arteries. Suspected additional high-grade calcific stenosis proximal left external iliac artery. Reproductive: Enlarged prostate, measures 4.8 cm transversely, impresses on the inferior bladder. Other: There is no  free fluid. There is a small volume of free air anterior to the liver, trace free air underneath the left hemidiaphragm but there is no free air adjacent the PEG tube. There is a small subcutaneous air locule in the left mid abdominal wall probably a site of recent injection. Musculoskeletal: Osteopenia and degenerative change lumbar spine, including with prominent marginal osteophytes L5-S1  with anterior bridging, mild hip DJD. IMPRESSION: 1. Small amounts of free air in the right anterior upper abdomen, trace free air beneath the left hemidiaphragm. Source indeterminate. Unclear if related to a PEG tube leak or bowel perforation but there is no further free air. If the PEG was placed December 2, there should no longer be intraperitoneal free air from that procedure. The mesentery is unremarkable and does not show focal inflammatory changes. Consultation with a surgeon is recommended. 2. Right-greater-than-left lower lobe infiltrates and bronchial mucus or fluid filling. Aspiration precautions recommended. 3. Tiny hepatic hypodensities which are too small to characterize but unchanged. 4. Constipation and diverticulosis. 5. Heavy aortoiliac calcification with stenoses as above. 6. Renal cysts including a thinly septated left renal cyst. 7. Prostatomegaly. 8. Cholelithiasis. Electronically Signed   By: Telford Nab M.D.   On: 06/22/2021 03:50   DG ABDOMEN PEG TUBE LOCATION  Result Date: 06/22/2021 CLINICAL DATA:  Peg tube verification EXAM: ABDOMEN - 1 VIEW COMPARISON:  Abdominal CT from earlier today FINDINGS: Percutaneous gastrostomy tube injection opacifies proximal small bowel. Catheter and retention balloon were at the stomach by prior CT. Urinary collecting system contrast from prior CT. Known pulmonary infiltrates at the bases. IMPRESSION: Located percutaneous gastrostomy tube. No extravasation of the injected contrast. Electronically Signed   By: Jorje Guild M.D.   On: 06/22/2021 06:48   DG Chest Port 1 View  Result Date: 06/21/2021 CLINICAL DATA:  Dyspnea EXAM: PORTABLE CHEST 1 VIEW COMPARISON:  06/21/2021, 06/09/2021 FINDINGS: Interval progression of bibasilar pulmonary infiltrate, more focal within the right lower lobe, in keeping with multifocal infection or aspiration in the acute setting. No pneumothorax or pleural effusion. Cardiac size within normal limits. Pulmonary  vascularity is normal. No acute bone abnormality. IMPRESSION: Progressive bibasilar pulmonary infiltrate, more extensive within the right lung base. Electronically Signed   By: Fidela Salisbury M.D.   On: 06/21/2021 22:58   ASSESSMENT AND PLAN:    Louis Stone is a 70 y.o. male with medical history significant of oropharynx cancer, s/p of G-tube placement, diabetes mellitus, hyperlipidemia, GERD, former smoker, aspiration pneumonia, who presents with shortness breath and cough.  Pt is a poor historian. History is limited. He states that he has cough and shortness of breath for few days.  Denies chest pain.    Chest x-ray showed right lower lobe infiltration.   Acute hypoxic respiratory failure due to aspiration pneumonia emphysema with history of smoking in the past -- patient has history of oral cancer. Perspective feeding. -- Came in with increasing shortness of breath white count of 11.4 tachycardia tachypnea -- currently requiring high flow nasal cannula 50% of fio2, 50 liter oxygen -- bronchodilator -- PRN nebs -- blood culture so far negative --IV unasyn  Nutrition Status: Nutrition Problem: Severe Malnutrition Etiology: cancer and cancer related treatments Signs/Symptoms: severe muscle depletion, severe fat depletion, percent weight loss   -- dietitian patient has been constantly use losing weight. Apparently has been using ensure bought from the store. He is supposed to be in [place]. He has lost 20 pounds. Will resume peg feeding.  Abnormal air under the  diaphragm as noted on x-ray -discussed with Dr. Hampton Abbot rechecked regarding peg tube and now okay to use. Continue to monitor  severe hypernatremia acute renal failure -- suspected due to improper use of tube feeding and free water fluid at home -- started on D5 water, continue free water flushes -- nephrology Dr. 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  >50% time spent on counselling and coordination of care  Note: This dictation was prepared with Dragon dictation along with smaller phrase technology. Any transcriptional errors that result from this process are unintentional.  Fritzi Mandes M.D    [facility]   CC: Primary care physician; Alene Mires Elyse Jarvis, MD Patient ID: Louis Stone, male   DOB: 12/22/1950, 70 y.o.   MRN: [id number]

## 2021-06-22 NOTE — Consult Note (Signed)
NAME:  Louis Stone, MRN:  [id number], DOB:  Mar 14, 1951, LOS: 1 ADMISSION DATE:  06/21/2021, CONSULTATION DATE:  06/22/2021 REFERRING MD:  Dr.Kakrakandy , CHIEF COMPLAINT:  Hypoxia   History of Present Illness:  70yo male w/ a past medical history of oropharynx cancer s/p G-tube placement, Type II diabetes, HLD, GERD and former smoker who presented to the ED 12/18 with a cough and shortness of breath.   Per the medical record, the patient has been having these symptoms for a few days. Denies fevers. ED workup revealed: elevated glucose of 500, WBC 11.4, Cr 1.58, Na 153, chest xray which revealed right lower lobe infiltrate concerning for aspiration pneumonia and initially hypoxic w/sats of 88% that improved with 2L [place].   The patients received 1L IVFs, unasyn and was started on IVFs at 75cc/hr. Cultures sent. COVID and Flu negative. Admitted to the hospitalist service.   Overnight, the patient became increasingly hypoxic requiring 10L high flow nasal cannula with oxygen saturations in the 70s-80s. Critical care was called to evaluate. On exam, patient was in no distress. He was alert and restless at the time of my examination. He is difficult to understand but denies any shortness of breath. He is no obvious respiratory distress. HR 120s, oxygen saturation reading 90% but poor pleth otherwise VSS.   Ordered a stat ABG which revealed: PaO2 55 and oxygen saturation of 88%. Patient placed on Optiflow.   Pertinent  Medical History  Type II Diabetes Oropharynx cancer s/p Gtube placement Hyperlipidemia Hypertension  Significant Hospital Events: Including procedures, antibiotic start and stop dates in addition to other pertinent events   Admitted 12/19. Optiflow. CTA chest to rule out PE  Interim History / Subjective:  Presented to ED 12/18 with shortness of breath and cough. Admitted for aspiration pneumonia. Worsening hypoxia overnight and critical care consulted.   Objective   Blood  pressure 118/76, pulse (!) 122, temperature 98 F (36.7 C), temperature source Oral, resp. rate 18, height 5\' 5"  (1.651 m), weight 54.4 kg, SpO2 92 %.        Intake/Output Summary (Last 24 hours) at 06/22/2021 0152 Last data filed at 06/21/2021 2044 Gross per 24 hour  Intake 1100 ml  Output 175 ml  Net 925 ml   Filed Weights   06/21/21 1239  Weight: 54.4 kg    Examination: General: Alert and restless HENT: pupils equal and reactive, sclera clear Lungs: Lungs diminished throughout, 10L [place] Cardiovascular: Heart sounds S1S2, no edema, warm and well perfused Abdomen: soft, non-distended, non-tender, Gtube in place Extremities: Palpable pulses x4  Neuro: Alert and oriented, difficult to understand and poor historian GU: No foley  Resolved Hospital Problem list     Assessment & Plan:   Acute hypoxemic respiratory failure Suspect Aspiration Pneumonia --Worsening hypoxia overnight --ABG: 7.47/38/52/27 --Transitioned to Optiflow 55L 98% Fio2 --Repeat CXR at 2200 with no significant changes --CTA chest pending to rule out PE --Last ECHO 07/2020: EF 50-55%, normal RV function, no wall motion abnormalities, valves not well visualized --Lactate cleared --WBC 11 --Continue Unasyn --PRN Duonebs --Urine legionella and S.Pneumococcal antigen pending per primary --Repeat ABG at 0500  Acute Kidney Injury --Cr 1.58 on admission but normal 2 weeks ago --Suspect due to poor intake/dehydration --Received 1L LR --BMP pending  Hypernatremia --Na 153 on arrival --Received IVFs as mentioned above --Stopped MIVFs given worsening hypoxia --Repeat level pending --Free water flushes 250cc Q6hr --BMPs Q6hr  Type II Diabetes Hyperglycemia --High dose SSI --Schedule insulin --Semglee 20units daily (home  med) ordered by primary --Poorly controlled, last HgbA1c 6.8% --Recently admitted the beginning of this month for hypoglycemia  Hypertension Hyperlipidemia --Home medications:  Lipitor and Metoprolol --Hold metoprolol for now but will continue Lipitor  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 --Tube feeds resumed by primary Best Practice (right click and "Reselect all SmartList Selections" daily)   Diet/type: tubefeeds DVT prophylaxis: prophylactic heparin  GI prophylaxis: PPI Lines: N/A Foley:  N/A Code Status:  full code Last date of multidisciplinary goals of care discussion [NA]  Labs   CBC: Recent Labs  Lab 06/21/21 1244  WBC 11.4*  HGB 13.9  HCT 44.8  MCV 83.4  PLT 778    Basic Metabolic Panel: Recent Labs  Lab 06/21/21 1244  NA 153*  K 4.4  CL 115*  CO2 29  GLUCOSE 407*  BUN 77*  CREATININE 1.52*  CALCIUM 9.9   GFR: Estimated Creatinine Clearance: 35.3 mL/min (A) (by C-G formula based on SCr of 1.52 mg/dL (H)). Recent Labs  Lab 06/21/21 1244 06/21/21 1458 06/21/21 1824  PROCALCITON 5.35  --   --   WBC 11.4*  --   --   LATICACIDVEN  --  2.2* 1.8    Liver Function Tests: Recent Labs  Lab 06/21/21 1244  AST 28  ALT 114*  ALKPHOS 101  BILITOT 0.8  PROT 8.8*  ALBUMIN 3.4*   No results for input(s): LIPASE, AMYLASE in the last 168 hours. No results for input(s): AMMONIA in the last 168 hours.  ABG    Component Value Date/Time   PHART 7.47 (H) 06/22/2021 0123   PCO2ART 38 06/22/2021 0123   PO2ART 52 (L) 06/22/2021 0123   HCO3 27.7 06/22/2021 0123   TCO2 35 (H) 07/17/2020 0038   ACIDBASEDEF 1.3 02/01/2021 1209   O2SAT 88.7 06/22/2021 0123     Coagulation Profile: No results for input(s): INR, PROTIME in the last 168 hours.  Cardiac Enzymes: No results for input(s): CKTOTAL, CKMB, CKMBINDEX, TROPONINI in the last 168 hours.  HbA1C: Hgb A1c MFr Bld  Date/Time Value Ref Range Status  02/02/2021 12:38 AM 6.8 (H) 4.8 - 5.6 % Final    Comment:    (NOTE) Pre diabetes:          5.7%-6.4%  Diabetes:              >6.4%  Glycemic control for   <7.0% adults with diabetes   07/20/2020 03:25 AM  6.6 (H) 4.8 - 5.6 % Final    Comment:    (NOTE) Pre diabetes:          5.7%-6.4%  Diabetes:              >6.4%  Glycemic control for   <7.0% adults with diabetes     CBG: Recent Labs  Lab 06/21/21 1241 06/21/21 1501 06/21/21 1702 06/21/21 2158  GLUCAP 375* 349* 319* 231*    Review of Systems:   Denies shortness of breath, chest pain, N/V  Past Medical History:  He,  has a past medical history of Arthritis, Diabetes mellitus without complication ([place]), Oropharynx cancer ([place]) (2009), and Personal history of tobacco use, presenting hazards to health (10/08/2015).   Surgical History:   Past Surgical History:  Procedure Laterality Date   IR GASTROSTOMY TUBE MOD SED  03/18/2021   IR MECH REMOV OBSTRUC MAT ANY COLON TUBE W/FLUORO  04/15/2021   IR [place] GASTRO/COLONIC TUBE PERCUT W/FLUORO  03/25/2021   THORACIC DISC SURGERY  1994   pt states  ruptured disc and had surgery to repair it     Social History:   reports that he quit smoking about 14 years ago. He has a 60.00 pack-year smoking history. He has never used smokeless tobacco. He reports that he does not drink alcohol and does not use drugs.   Family History:  His family history includes CVA in his father. There is no history of Prostate cancer.   Allergies No Known Allergies   Home Medications  Prior to Admission medications   Medication Sig Start Date End Date Taking? Authorizing Provider  acetaminophen (TYLENOL) 325 MG tablet Take 650 mg by mouth every 6 (six) hours as needed for headache or mild pain.   Yes [provider]  albuterol (PROVENTIL HFA;VENTOLIN HFA) 108 (90 Base) MCG/ACT inhaler Inhale 2 puffs into the lungs every 6 (six) hours as needed for wheezing.   Yes [provider]  albuterol (PROVENTIL) (2.5 MG/3ML) 0.083% nebulizer solution Inhale 3 mLs into the lungs every 6 (six) hours as needed for wheezing. 06/09/21  Yes Swayze, Ava, DO  guaiFENesin (MUCINEX) 600 MG 12 hr tablet Take 2  tablets (1,200 mg total) by mouth 2 (two) times daily. 06/09/21 06/09/22 Yes Swayze, Ava, DO  omeprazole (PRILOSEC) 40 MG capsule Take 40 mg by mouth daily. 06/06/20  Yes [provider]  TOUJEO SOLOSTAR 300 UNIT/ML Solostar Pen Inject 15 Units into the skin daily. Patient taking differently: Inject 25 Units into the skin at bedtime. 07/21/20  Yes Dahal, Marlowe Aschoff, MD  Water For Irrigation, Sterile (FREE WATER) SOLN Place 100 mLs into feeding tube 6 (six) times daily. 03/21/21  Yes Wouk, Ailene Rud, MD  atorvastatin (LIPITOR) 20 MG tablet Place 1 tablet (20 mg total) into feeding tube daily. 03/21/21   Wouk, Ailene Rud, MD  famotidine (PEPCID) 40 MG tablet Place 1 tablet (40 mg total) into feeding tube daily. 03/21/21   Wouk, Ailene Rud, MD  metoprolol succinate (TOPROL-XL) 25 MG 24 hr tablet Take 0.5 tablets (12.5 mg total) by mouth daily. 06/10/21   Swayze, Ava, DO  Nutritional Supplements (FEEDING SUPPLEMENT, JEVITY 1.5 CAL/FIBER,) LIQD Place 237 mLs into feeding tube 6 (six) times daily. 06/09/21   Swayze, Ava, DO     Critical care time: 65 minutes

## 2021-06-22 NOTE — ED Notes (Signed)
Oxygen saturations noted 87-88% on HFNC. Dr. Hal Hope notified of patient change. Per Dr. IV fluids stopped. RT notified for Bipap need. No acute distress. RR remains even and nonlabored.

## 2021-06-22 NOTE — ED Notes (Signed)
Dr.Kakrakandy at bedside. Second albuterol neb given with no change in saturations. Duoneb in progress at this time. RT contacted per request of Dr. No significant change at this time. No acute distress at this time.

## 2021-06-23 DIAGNOSIS — G9341 Metabolic encephalopathy: Secondary | ICD-10-CM | POA: Diagnosis not present

## 2021-06-23 DIAGNOSIS — N179 Acute kidney failure, unspecified: Secondary | ICD-10-CM | POA: Diagnosis not present

## 2021-06-23 DIAGNOSIS — J690 Pneumonitis due to inhalation of food and vomit: Secondary | ICD-10-CM | POA: Diagnosis not present

## 2021-06-23 DIAGNOSIS — Z789 Other specified health status: Secondary | ICD-10-CM

## 2021-06-23 DIAGNOSIS — E860 Dehydration: Secondary | ICD-10-CM | POA: Diagnosis not present

## 2021-06-23 DIAGNOSIS — Z515 Encounter for palliative care: Secondary | ICD-10-CM

## 2021-06-23 LAB — CBC
HCT: 39.2 % (ref 39.0–52.0)
Hemoglobin: 12.1 g/dL — ABNORMAL LOW (ref 13.0–17.0)
MCH: 26.2 pg (ref 26.0–34.0)
MCHC: 30.9 g/dL (ref 30.0–36.0)
MCV: 84.8 fL (ref 80.0–100.0)
Platelets: 192 10*3/uL (ref 150–400)
RBC: 4.62 MIL/uL (ref 4.22–5.81)
RDW: 14.6 % (ref 11.5–15.5)
WBC: 11.4 10*3/uL — ABNORMAL HIGH (ref 4.0–10.5)
nRBC: 0 % (ref 0.0–0.2)

## 2021-06-23 LAB — BASIC METABOLIC PANEL
Anion gap: 7 (ref 5–15)
BUN: 38 mg/dL — ABNORMAL HIGH (ref 8–23)
CO2: 28 mmol/L (ref 22–32)
Calcium: 9.1 mg/dL (ref 8.9–10.3)
Chloride: 119 mmol/L — ABNORMAL HIGH (ref 98–111)
Creatinine, Ser: 1.11 mg/dL (ref 0.61–1.24)
GFR, Estimated: >60 mL/min (ref >60)
Glucose, Bld: 390 mg/dL — ABNORMAL HIGH (ref 70–99)
Potassium: 3.5 mmol/L (ref 3.5–5.1)
Sodium: 154 mmol/L — ABNORMAL HIGH (ref 135–145)

## 2021-06-23 LAB — GLUCOSE, CAPILLARY
Glucose-Capillary: 328 mg/dL — ABNORMAL HIGH (ref 70–99)
Glucose-Capillary: 335 mg/dL — ABNORMAL HIGH (ref 70–99)
Glucose-Capillary: 450 mg/dL — ABNORMAL HIGH (ref 70–99)
Glucose-Capillary: >600 mg/dL (ref 70–99)
Glucose-Capillary: 278 mg/dL — ABNORMAL HIGH (ref 70–99)
Glucose-Capillary: 192 mg/dL — ABNORMAL HIGH (ref 70–99)
Glucose-Capillary: 161 mg/dL — ABNORMAL HIGH (ref 70–99)

## 2021-06-23 LAB — LEGIONELLA PNEUMOPHILA SEROGP 1 UR AG: L. pneumophila Serogp 1 Ur Ag: NEGATIVE

## 2021-06-23 LAB — GLUCOSE, RANDOM: Glucose, Bld: 461 mg/dL — ABNORMAL HIGH (ref 70–99)

## 2021-06-23 LAB — HEMOGLOBIN A1C
Hgb A1c MFr Bld: 8.3 % — ABNORMAL HIGH (ref 4.8–5.6)
Mean Plasma Glucose: 192 mg/dL

## 2021-06-23 LAB — PHOSPHORUS: Phosphorus: 2.4 mg/dL — ABNORMAL LOW (ref 2.5–4.6)

## 2021-06-23 LAB — MAGNESIUM: Magnesium: 2.6 mg/dL — ABNORMAL HIGH (ref 1.7–2.4)

## 2021-06-23 LAB — SODIUM: Sodium: 154 mmol/L — ABNORMAL HIGH (ref 135–145)

## 2021-06-23 MED ORDER — INSULIN GLARGINE-YFGN 100 UNIT/ML ~~LOC~~ SOLN
22.0000 [IU] | Freq: Every day | SUBCUTANEOUS | Status: DC
Start: 2021-06-23 — End: 2021-06-25
  Administered 2021-06-23 – 2021-06-25 (×3): 22 [IU] via SUBCUTANEOUS
  Filled 2021-06-23 (×5): qty 0.22

## 2021-06-23 MED ORDER — FREE WATER
100.0000 mL | Status: DC
  Administered 2021-06-23 – 2021-06-24 (×12): 100 mL

## 2021-06-23 MED ORDER — INSULIN ASPART 100 UNIT/ML IJ SOLN
0.0000 [IU] | Freq: Every day | INTRAMUSCULAR | Status: DC
  Filled 2021-06-23: qty 1

## 2021-06-23 MED ORDER — DEXTROSE 5 % IV SOLN: INTRAVENOUS | Status: DC

## 2021-06-23 MED ORDER — SODIUM CHLORIDE 0.45 % IV SOLN: INTRAVENOUS | Status: DC

## 2021-06-23 MED ORDER — INSULIN ASPART 100 UNIT/ML IJ SOLN: 0.0000 [IU] | Freq: Three times a day (TID) | INTRAMUSCULAR | Status: DC

## 2021-06-23 MED ORDER — STERILE WATER FOR INJECTION IV SOLN
INTRAVENOUS | Status: DC
  Filled 2021-06-23 (×3): qty 9.62

## 2021-06-23 MED ORDER — INSULIN ASPART 100 UNIT/ML IJ SOLN
0.0000 [IU] | INTRAMUSCULAR | Status: DC
  Administered 2021-06-23: 20:00:00 3 [IU] via SUBCUTANEOUS
  Administered 2021-06-23: 16:00:00 8 [IU] via SUBCUTANEOUS
  Administered 2021-06-24: 09:00:00 3 [IU] via SUBCUTANEOUS
  Administered 2021-06-24 (×3): 5 [IU] via SUBCUTANEOUS
  Administered 2021-06-24: 17:00:00 11 [IU] via SUBCUTANEOUS
  Administered 2021-06-24 – 2021-06-25 (×2): 2 [IU] via SUBCUTANEOUS
  Administered 2021-06-25: 04:00:00 11 [IU] via SUBCUTANEOUS
  Administered 2021-06-25 (×2): 8 [IU] via SUBCUTANEOUS
  Administered 2021-06-25: 09:00:00 11 [IU] via SUBCUTANEOUS
  Administered 2021-06-26: 16:00:00 15 [IU] via SUBCUTANEOUS
  Administered 2021-06-26: 12:00:00 3 [IU] via SUBCUTANEOUS
  Administered 2021-06-26: 08:00:00 2 [IU] via SUBCUTANEOUS
  Filled 2021-06-23 (×15): qty 1

## 2021-06-23 MED ORDER — INSULIN ASPART 100 UNIT/ML IJ SOLN
INTRAMUSCULAR | Status: AC
  Administered 2021-06-23: 12:00:00 15 [IU] via SUBCUTANEOUS
  Filled 2021-06-23: qty 1

## 2021-06-23 NOTE — Consult Note (Signed)
Consultation Note Date: 06/23/2021   Patient Name: Louis Stone  DOB: August 29, 1950  MRN: [id number]  Age / Sex: 70 y.o., male  PCP: Revelo, Elyse Jarvis, MD Referring Physician: Fritzi Mandes, MD  Reason for Consultation: Establishing goals of care  HPI/Patient Profile: 70 y.o. male  with past medical history of oropharynx cancer status post G-tube placement, diabetes type 2, GERD, HLD and former smoker admitted on 06/21/2021 with complaints of a cough and shortness of breath.  X-ray revealed right lower lobe infiltrate concerning for aspiration pneumonia.  Patient had hypoxic event but has now been weaned to 3 L nasal cannula.  Palliative medicine was consulted to discuss goals of care.  Patient is familiar to our service as patient was seen by my colleague Aniceto Boss in September of this year.  Clinical Assessment and Goals of Care: I have reviewed medical records including EPIC notes, labs and imaging, assessed the patient and then met with patient at bedside to discuss diagnosis prognosis, GOC, EOL wishes, disposition and options.  I introduced Palliative Medicine as specialized medical care for people living with serious illness. It focuses on providing relief from the symptoms and stress of a serious illness. The goal is to improve quality of life for both the patient and the family.  We discussed a brief life review of the patient.  Patient is able to mouth a few words.  He says that he is in charge of me and continues to point to his chest wall saying this.  As far as functional and nutritional status patient endorses he was "fine".  I asked for further detail regarding events that led up to him needing to come to the hospital.  He continued to say he was "fine".  I attempted to elicit values and goals of care important to the patient.  Asked if he understood what was happening with him medically.  He  said yes.  I asked him to elaborate on this and he said he felt fine.  I asked if there were family members that I could speak with regarding his health status prior to this admission.  He said that Louis Stone was "mine" and nodded in agreement when asked if I could speak with her regarding his care.  In review of patient's chart, patient's sister Louis Stone is listed as HCPOA.  Made 2 phone calls and no answer for [facility].  Also attempted to speak with patient's daughter Louis Stone.  No answer and HIPAA appropriate voicemail left.  Family is facing treatment option decisions, advanced directive, and anticipatory care needs.    Discussed with patient the importance of continued conversation with family and the medical providers regarding overall plan of care and treatment options, ensuring decisions are within the context of the patients values and GOCs.  He nodded in agreement and continued to say he "felt fine".  Questions and concerns were addressed.   Of medicine will continue to follow patient throughout his hospitalization.  Primary Decision Maker PATIENT  Code Status/Advance Care Planning: Full code  Prognosis:  Unable to determine  Discharge Planning: To Be Determined  Primary Diagnoses: Present on Admission:  Aspiration pneumonia ([place])  Acute metabolic encephalopathy  Acute renal failure (ARF) (HCC)  GERD without esophagitis  Protein-calorie malnutrition, severe  Squamous cell carcinoma of oropharynx (HCC)  Dehydration  Hypernatremia  HLD (hyperlipidemia)  Acute respiratory failure with hypoxia (HCC)  Acute respiratory failure with hypoxemia (HCC)   Physical Exam Constitutional:      General: He is not in acute distress.    Appearance: Normal appearance. He is not ill-appearing or toxic-appearing.  HENT:     Head: Normocephalic and atraumatic.     Mouth/Throat:     Mouth: Mucous membranes are moist.  Cardiovascular:     Rate and Rhythm: Normal rate.     Pulses:  Normal pulses.  Pulmonary:     Effort: Pulmonary effort is normal.  Abdominal:     Palpations: Abdomen is soft.     Comments: G tube  Musculoskeletal:     Comments: Generalized weakness  Skin:    General: Skin is warm and dry.  Neurological:     Mental Status: He is alert.    Vital Signs: BP 90/64 (BP Location: Left Arm)    Pulse 92    Temp 98 F (36.7 C) (Oral)    Resp (!) 21    Ht 5' 7"  (1.702 m)    Wt 48.2 kg    SpO2 96%    BMI 16.64 kg/m  Pain Scale: 0-10   Pain Score: 0-No pain SpO2: SpO2: 96 % O2 Device:SpO2: 96 % O2 Flow Rate: .O2 Flow Rate (L/min): 2 L/min  Palliative Assessment/Data: 40%     I discussed this patient's plan of care with nursing, patient.  Thank you for this consult. Palliative medicine will continue to follow and assist holistically.   Time Total: 30 minutes Greater than 50%  of this time was spent counseling and coordinating care related to the above assessment and plan.  Signed by: Jordan Hawks, DNP, FNP-BC Palliative Medicine    Please contact Palliative Medicine Team phone at [phone] for questions and concerns.  For individual provider: See Shea Evans

## 2021-06-23 NOTE — Progress Notes (Signed)
[facility] NAME: Louis Stone    MR#:  [id number]  DATE OF BIRTH:  [birth year]  SUBJECTIVE:  patient came in with increasing shortness of breath and weakness. 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  HENT:  Negative for ear discharge, ear pain and nosebleeds.   Eyes:  Negative for blurred vision, pain and discharge.  Respiratory:  Positive for shortness of breath. Negative for sputum production, wheezing and stridor.   Cardiovascular:  Negative for chest pain, palpitations, orthopnea and PND.  Gastrointestinal:  Negative for abdominal pain, diarrhea, nausea and vomiting.  Genitourinary:  Negative for frequency and urgency.  Musculoskeletal:  Negative for back pain and joint pain.  Neurological:  Positive for weakness. Negative for sensory change, speech change and focal weakness.  Psychiatric/Behavioral:  Negative for depression and hallucinations. The patient is not nervous/anxious.   Tolerating Diet:npo Tolerating PT: pending  DRUG ALLERGIES:  No Known Allergies  VITALS:  Blood pressure 90/64, pulse 92, temperature 98 F (36.7 C), temperature source Oral, resp. rate (!) 21, height 5\' 7"  (1.702 m), weight 48.2 kg, SpO2 96 %.  PHYSICAL EXAMINATION:   Physical Exam  GENERAL:  70 y.o.-year-old patient lying in the bed with no acute distress. Thin cachectic HEENT: Head atraumatic, normocephalic. HFNC + LUNGS: decreased breath sounds bilaterally, no wheezing, rales, rhonchi. No use of accessory muscles of respiration.  CARDIOVASCULAR: S1, S2 normal. No murmurs, rubs, or gallops. tachycardia ABDOMEN: Soft, nontender, nondistended. Bowel sounds present. No organomegaly or mass.  PEG+ EXTREMITIES: No cyanosis,  clubbing or edema b/l.    NEUROLOGIC: nonfocal PSYCHIATRIC:  patient is alert  SKIN: No obvious rash, lesion, or ulcer.   LABORATORY PANEL:  CBC Recent Labs  Lab 06/23/21 0521  WBC 11.4*  HGB 12.1*  HCT 39.2  PLT 192     Chemistries  Recent Labs  Lab 06/21/21 1244 06/22/21 0151 06/23/21 0521 06/23/21 1158  NA 153*   < > 154*  --   K 4.4   < > 3.5  --   CL 115*   < > 119*  --   CO2 29   < > 28  --   GLUCOSE 407*   < > 390* 461*  BUN 77*   < > 38*  --   CREATININE 1.52*   < > 1.11  --   CALCIUM 9.9   < > 9.1  --   MG  --   --  2.6*  --   AST 28  --   --   --   ALT 114*  --   --   --   ALKPHOS 101  --   --   --   BILITOT 0.8  --   --   --    < > = values in this interval not displayed.    Cardiac Enzymes No results for input(s): TROPONINI in the last 168 hours. RADIOLOGY:  CT Angio Chest Pulmonary Embolism (PE) W or WO Contrast  Addendum Date: 06/22/2021   ADDENDUM REPORT: 06/22/2021 02:50 ADDENDUM: Discussed case over phone with Dr. Hal Hope at 2:46 a.m., 06/22/2021. CT abdomen and pelvis will be performed to assess for bowel perforation. Electronically Signed   By: Telford Nab  M.D.   On: 06/22/2021 02:50   Result Date: 06/22/2021 CLINICAL DATA:  Increasing hypoxemia, suspected pulmonary thromboembolism. EXAM: CT ANGIOGRAPHY CHEST WITH CONTRAST TECHNIQUE: Multidetector CT imaging of the chest was performed using the standard protocol during bolus administration of intravenous contrast. Multiplanar CT image reconstructions and MIPs were obtained to evaluate the vascular anatomy. CONTRAST:  78mL OMNIPAQUE IOHEXOL 350 MG/ML SOLN COMPARISON:  CTA chest 03/12/2021 and chest CT no contrast 10/08/2015. FINDINGS: Cardiovascular: There suboptimal bolus timing. No arterial embolus is seen through the segmental divisions but the subsegmental arteries are unopacified due to bolus timing. There is no arterial dilatation or IVC reflux and no right heart chamber expansion. The  heart is normal in size and a small anterior pericardial effusion is again noted. There is no visible coronary artery calcification. There is mild aortic atherosclerosis without aneurysm or dissection with homogeneous enhancement in the normal caliber great vessels. The pulmonary veins are normal caliber. Mediastinum/Nodes: There are mildly enlarged, increasingly prominent right hilar nodes up to 1.4 cm in short axis probably reactive etiology. There are stable slightly prominent left hilar lymph nodes up to 1.1 cm in short axis. There is increasing prominence of subcarinal nodes up to 1.2 cm in short axis: With precarinal nodal complex larger today at 1.1 cm short axis. No axillary or further mediastinal adenopathy is seen. There is a small hiatal hernia. No esophageal thickening or thyroid mass noted. Lungs/Pleura: Stable biapical scarring with bronchiolectasis. There is mucous plugging or aspirate in some of the bilateral lower lobe posterior basal second and third order bronchi. Aspirated material was previously noted in the right main bronchus. There is interval worsening of patchy dense consolidation in right lower lobe segments and increasing ground-glass and tree-in-bud infiltrates in a patchy distribution in the left lower lobe, with worsening tree-in-bud opacities peripherally in the right lower lobe and scattered in the posterior segment of the right upper lobe. Linear scar-like opacities are again noted both bases. Upper Abdomen: There are stones posteriorly in the gallbladder but no wall thickening. There is a small amount of scattered free intraperitoneal air anterior to the liver and trace free air within a small hiatal hernia to the right. There is trace free air in the subphrenic space on the left. PEG tube in place was not seen on the last CT. Was this recently placed? Musculoskeletal: No chest wall abnormality. No acute or significant osseous findings. Review of the MIP images confirms the above  findings. IMPRESSION: 1. No arterial embolus is seen through the segmental divisions. Subsegmental arteries are unopacified and not evaluated. 2. Mild increased prominence of right hilar and mediastinal lymph nodes, possibly reactive. No bulky or encasing adenopathy. 3. Bilateral lower lobe second and third order basilar bronchial mucous plugging or aspirate, with worsening opacities in both lower lobes most likely due to bronchopneumonia given the presence of tree-in-bud interstitial opacities and probably aspiration etiology. Additional tree-in-bud opacities have developed in the right upper lobe posterior segment further suggesting an aspiration pneumonitis. 4. Small pericardial effusion. 5. Scattered free air in the upper abdomen. Source indeterminate. If patient has not had a recent procedure such as a laparoscopic procedure or PEG placement, assessment for hollow viscus perforation must be made. 6. Mild aortic atherosclerosis. 7. Cholelithiasis. 8. Ordering physician will be contacted by [facility] and this report will be addended at that time. Electronically Signed: By: Telford Nab M.D. On: 06/22/2021 02:41   CT ABDOMEN PELVIS W CONTRAST  Result Date: 06/22/2021 CLINICAL DATA:  Free  air in the upper abdomen on CTA chest today. EXAM: CT ABDOMEN AND PELVIS WITH CONTRAST TECHNIQUE: Multidetector CT imaging of the abdomen and pelvis was performed using the standard protocol following bolus administration of intravenous contrast. CONTRAST:  19mL OMNIPAQUE IOHEXOL 300 MG/ML  SOLN COMPARISON:  CTA chest today, and CTs of abdomen and pelvis with contrast 02/16/2021 and 02/09/2020. FINDINGS: Factors affecting image quality: Abundant respiratory motion artifact. Lower chest: Right greater than left lower lobe consolidative airspace disease and bilateral lower lobe bronchial mucus or fluid filling. Additional peripheral tree-in-bud infiltrates concerning for aspiration. Hepatobiliary: There are few scattered tiny  hepatic hypodensities which are too small to characterize, calcified granuloma noted in the liver dome. No new abnormality is seen, no mass. There are layering stones in the posterior gallbladder but no wall thickening or biliary dilatation. Pancreas: No mass, adjacent edema, or ductal dilatation. Spleen: No mass or splenomegaly. Adrenals/Urinary Tract: There are bilateral renal cysts, largest is a stable thinly septated 3.3 cm cyst on the left. There is no renal or adrenal mass enhancement, no hydronephrosis is seen. Contrast partially opacifies the bladder which is normal in thickness. Stomach/Bowel: Small hiatal hernia. PEG tube not seen previously. The first abdomen film on which this was reported was a flat plate single-view of 06/05/2021. The stomach is contracted around the balloon. There is no small bowel thickening or dilatation. The appendix is normal caliber. There are no findings of colitis or diverticulitis. Mild fecal stasis. Vascular/Lymphatic: Extensive aortoiliac calcific plaque, suspect at least 50-60% calcific stenosis distal right common iliac artery, suspected high-grade calcific stenoses in the proximal to mid internal iliac arteries. Suspected additional high-grade calcific stenosis proximal left external iliac artery. Reproductive: Enlarged prostate, measures 4.8 cm transversely, impresses on the inferior bladder. Other: There is no free fluid. There is a small volume of free air anterior to the liver, trace free air underneath the left hemidiaphragm but there is no free air adjacent the PEG tube. There is a small subcutaneous air locule in the left mid abdominal wall probably a site of recent injection. Musculoskeletal: Osteopenia and degenerative change lumbar spine, including with prominent marginal osteophytes L5-S1 with anterior bridging, mild hip DJD. IMPRESSION: 1. Small amounts of free air in the right anterior upper abdomen, trace free air beneath the left hemidiaphragm. Source  indeterminate. Unclear if related to a PEG tube leak or bowel perforation but there is no further free air. If the PEG was placed December 2, there should no longer be intraperitoneal free air from that procedure. The mesentery is unremarkable and does not show focal inflammatory changes. Consultation with a surgeon is recommended. 2. Right-greater-than-left lower lobe infiltrates and bronchial mucus or fluid filling. Aspiration precautions recommended. 3. Tiny hepatic hypodensities which are too small to characterize but unchanged. 4. Constipation and diverticulosis. 5. Heavy aortoiliac calcification with stenoses as above. 6. Renal cysts including a thinly septated left renal cyst. 7. Prostatomegaly. 8. Cholelithiasis. Electronically Signed   By: Telford Nab M.D.   On: 06/22/2021 03:50   DG ABDOMEN PEG TUBE LOCATION  Result Date: 06/22/2021 CLINICAL DATA:  Peg tube verification EXAM: ABDOMEN - 1 VIEW COMPARISON:  Abdominal CT from earlier today FINDINGS: Percutaneous gastrostomy tube injection opacifies proximal small bowel. Catheter and retention balloon were at the stomach by prior CT. Urinary collecting system contrast from prior CT. Known pulmonary infiltrates at the bases. IMPRESSION: Located percutaneous gastrostomy tube. No extravasation of the injected contrast. Electronically Signed   By: Gilford Silvius.D.  On: 06/22/2021 06:48   DG Chest Port 1 View  Result Date: 06/21/2021 CLINICAL DATA:  Dyspnea EXAM: PORTABLE CHEST 1 VIEW COMPARISON:  06/21/2021, 06/09/2021 FINDINGS: Interval progression of bibasilar pulmonary infiltrate, more focal within the right lower lobe, in keeping with multifocal infection or aspiration in the acute setting. No pneumothorax or pleural effusion. Cardiac size within normal limits. Pulmonary vascularity is normal. No acute bone abnormality. IMPRESSION: Progressive bibasilar pulmonary infiltrate, more extensive within the right lung base. Electronically Signed    By: Fidela Salisbury M.D.   On: 06/21/2021 22:58   ASSESSMENT AND PLAN:    Swade Shonka is a 70 y.o. male with medical history significant of oropharynx cancer, s/p of G-tube placement, diabetes mellitus, hyperlipidemia, GERD, former smoker, aspiration pneumonia, who presents with shortness breath and cough.  Pt is a poor historian. History is limited. He states that he has cough and shortness of breath for few days.  Denies chest pain.    Chest x-ray showed right lower lobe infiltration.   Acute hypoxic respiratory failure due to aspiration pneumonia emphysema with history of smoking in the past -- patient has history of oral cancer. Perspective feeding. -- Came in with increasing shortness of breath white count of 11.4 tachycardia tachypnea -- currently requiring high flow nasal cannula 50% of fio2, 50 liter oxygen -- bronchodilator -- PRN nebs -- blood culture so far negative --IV unasyn --12/20-- weaned down to 96% on 2 liters  Nutrition Status: Nutrition Problem: Severe Malnutrition Etiology: cancer and cancer related treatments Signs/Symptoms: severe muscle depletion, severe fat depletion, percent weight loss   -- dietitian patient has been constantly use losing weight. Apparently has been using ensure bought from the store. He is supposed to be in [place]. He has lost 20 pounds. Will resume peg feeding.  Abnormal air under the diaphragm as noted on x-ray -discussed with Dr. Hampton Abbot rechecked regarding peg tube and now okay to use.   severe hypernatremia acute renal failure -- suspected due to improper use of tube feeding and free water fluid at home -- started on D5 water, continue free water flushes -- nephrology Dr. 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  >50% time spent on counselling and coordination of care  Note: This dictation was prepared with Dragon dictation along with smaller phrase technology. Any transcriptional errors that result from this process are unintentional.  Fritzi Mandes M.D    [facility]   CC: Primary care physician; Alene Mires Elyse Jarvis, MD Patient ID: Louis Stone, male   DOB: 08-09-1950, 70 y.o.   MRN: [id number]

## 2021-06-23 NOTE — Progress Notes (Signed)
Inpatient Diabetes Program Recommendations  AACE/ADA: New Consensus Statement on Inpatient Glycemic Control  Target Ranges:  Prepandial:   less than 140 mg/dL      Peak postprandial:   less than 180 mg/dL (1-2 hours)      Critically ill patients:  140 - 180 mg/dL    Latest Reference Range & Units 06/22/21 07:29 06/22/21 11:11 06/22/21 15:21 06/22/21 19:27 06/22/21 23:57 06/23/21 03:53 06/23/21 07:30  Glucose-Capillary 70 - 99 mg/dL 124 (H) 150 (H) 219 (H) 283 (H) 367 (H) 328 (H) 335 (H)   Review of Glycemic Control  Diabetes history: DM2 Outpatient Diabetes medications: Toujeo 25 units QHS Current orders for Inpatient glycemic control: Novolog 0-9 units Q4H; Jevity 237 ml 6 times per day, D5 @ 125 ml/hr  Inpatient Diabetes Program Recommendations:    Insulin: Please consider ordering Semglee 22 units Q24H starting now.  If glucose continues to be elevated with Semglee added back, may need tube feeding coverage as well.  Thanks, Barnie Alderman, RN, MSN, CDE Diabetes Coordinator Inpatient Diabetes Program [phone] (Team Pager from 8am to 5pm)

## 2021-06-23 NOTE — Progress Notes (Signed)
[facility]  ROUNDING NOTE   Subjective:  Patient seen and evaluated at bedside. Currently on D5W. Serum sodium down to 154. Currently awake and alert. Creatinine also down to 1.1.   Objective:  Vital signs in last 24 hours:  Temp:  [98 F (36.7 C)-98.4 F (36.9 C)] 98.2 F (36.8 C) (12/20 0200) Pulse Rate:  [95-122] 95 (12/20 0500) Resp:  [20-40] 25 (12/20 0500) BP: (97-127)/(64-85) 115/66 (12/20 0500) SpO2:  [94 %-100 %] 98 % (12/20 0741) FiO2 (%):  [40 %-50 %] 40 % (12/20 0741)  Weight change:  Filed Weights   06/21/21 1239 06/22/21 0331  Weight: 54.4 kg 48.2 kg    Intake/Output: I/O last 3 completed shifts: In: 3948.6 [I.V.:2861.5; NG/GT:487; IV Piggyback:600.1] Out: 775 [Urine:775]   Intake/Output this shift:  No intake/output data recorded.  Physical Exam: General: Cachectic appearing male  Head: Normocephalic, atraumatic. Moist oral mucosal membranes  Eyes: Anicteric  Neck: Supple  Lungs:  Basilar rales, normal effort  Heart: S1S2 no rubs  Abdomen:  Soft, nontender, bowel sounds present  Extremities: No peripheral edema.  Neurologic: Awake, alert, following commands  Skin: No acute rash  Access: No hemodialysis access    Basic Metabolic Panel: Recent Labs  Lab 06/21/21 1244 06/22/21 0151 06/22/21 0545 06/22/21 1646 06/23/21 0521  NA 153* 157* 162* 161* 154*  K 4.4 2.5* 3.8 4.0 3.5  CL 115* >130* 123* 123* 119*  CO2 29 21* 31 30 28   GLUCOSE 407* 130* 131* 270* 390*  BUN 77* 45* 56* 53* 38*  CREATININE 1.52* 0.76 1.15 1.33* 1.11  CALCIUM 9.9 6.4* 9.6 9.4 9.1  MG  --   --   --   --  2.6*  PHOS  --   --   --   --  2.4*    Liver Function Tests: Recent Labs  Lab 06/21/21 1244  AST 28  ALT 114*  ALKPHOS 101  BILITOT 0.8  PROT 8.8*  ALBUMIN 3.4*   No results for input(s): LIPASE, AMYLASE in the last 168 hours. No results for input(s): AMMONIA in the last 168 hours.  CBC: Recent Labs  Lab 06/21/21 1244 06/22/21 0545  06/23/21 0521  WBC 11.4* 9.8 11.4*  HGB 13.9 14.1 12.1*  HCT 44.8 46.0 39.2  MCV 83.4 83.8 84.8  PLT 243 227 192    Cardiac Enzymes: No results for input(s): CKTOTAL, CKMB, CKMBINDEX, TROPONINI in the last 168 hours.  BNP: Invalid input(s): POCBNP  CBG: Recent Labs  Lab 06/22/21 1521 06/22/21 1927 06/22/21 2357 06/23/21 0353 06/23/21 0730  GLUCAP 219* 283* 367* 328* 335*    Microbiology: Results for orders placed or performed during the hospital encounter of 06/21/21  Resp Panel by RT-PCR (Flu A&B, Covid) Nasopharyngeal Swab     Status: None   Collection Time: 06/21/21  2:58 PM   Specimen: Nasopharyngeal Swab; Nasopharyngeal(NP) swabs in vial transport medium  Result Value Ref Range Status   SARS Coronavirus 2 by RT PCR NEGATIVE NEGATIVE Final    Comment: (NOTE) SARS-CoV-2 target nucleic acids are NOT DETECTED.  The SARS-CoV-2 RNA is generally detectable in upper respiratory specimens during the acute phase of infection. The lowest concentration of SARS-CoV-2 viral copies this assay can detect is 138 copies/mL. A negative result does not preclude SARS-Cov-2 infection and should not be used as the sole basis for treatment or other patient management decisions. A negative result may occur with  improper specimen collection/handling, submission of specimen other than nasopharyngeal swab, presence  of viral mutation(s) within the areas targeted by this assay, and inadequate number of viral copies(<138 copies/mL). A negative result must be combined with clinical observations, patient history, and epidemiological information. The expected result is Negative.  Fact Sheet for Patients:  EntrepreneurPulse.com.au  Fact Sheet for Healthcare Providers:  IncredibleEmployment.be  This test is no t yet approved or cleared by the [place] FDA and  has been authorized for detection and/or diagnosis of SARS-CoV-2 by FDA under an Emergency  Use Authorization (EUA). This EUA will remain  in effect (meaning this test can be used) for the duration of the COVID-19 declaration under Section 564(b)(1) of the Act, 21 U.S.C.section 360bbb-3(b)(1), unless the authorization is terminated  or revoked sooner.       Influenza A by PCR NEGATIVE NEGATIVE Final   Influenza B by PCR NEGATIVE NEGATIVE Final    Comment: (NOTE) The Xpert Xpress SARS-CoV-2/FLU/RSV plus assay is intended as an aid in the diagnosis of influenza from Nasopharyngeal swab specimens and should not be used as a sole basis for treatment. Nasal washings and aspirates are unacceptable for Xpert Xpress SARS-CoV-2/FLU/RSV testing.  Fact Sheet for Patients: EntrepreneurPulse.com.au  Fact Sheet for Healthcare Providers: IncredibleEmployment.be  This test is not yet approved or cleared by the [place] FDA and has been authorized for detection and/or diagnosis of SARS-CoV-2 by FDA under an Emergency Use Authorization (EUA). This EUA will remain in effect (meaning this test can be used) for the duration of the COVID-19 declaration under Section 564(b)(1) of the Act, 21 U.S.C. section 360bbb-3(b)(1), unless the authorization is terminated or revoked.  Performed at [facility], [address] [place], [place] [address]   Culture, blood (Routine X 2) w Reflex to ID Panel     Status: None (Preliminary result)   Collection Time: 06/21/21  2:58 PM   Specimen: BLOOD  Result Value Ref Range Status   Specimen Description BLOOD BLOOD RIGHT FOREARM  Final   Special Requests   Final    BOTTLES DRAWN AEROBIC AND ANAEROBIC Blood Culture results may not be optimal due to an inadequate volume of blood received in culture bottles   Culture   Final    NO GROWTH 2 DAYS Performed at [facility], [address], [place], [place] [address]    Report Status PENDING  Incomplete  Culture, blood (Routine X 2) w Reflex to ID  Panel     Status: None (Preliminary result)   Collection Time: 06/21/21  2:58 PM   Specimen: BLOOD  Result Value Ref Range Status   Specimen Description   Final    BLOOD LEFT ANTECUBITAL Performed at [facility], [address] [place], [place] [address]    Special Requests   Final    BOTTLES DRAWN AEROBIC AND ANAEROBIC Blood Culture results may not be optimal due to an inadequate volume of blood received in culture bottles Performed at [facility], [address] [place], [place] [address]    Culture  Setup Time   Final    GRAM POSITIVE COCCI IN BOTH AEROBIC AND ANAEROBIC BOTTLES CRITICAL RESULT CALLED TO, READ BACK BY AND VERIFIED WITHOtto Herb Dr [facility] [phone] 06/22/21 HNM Performed at [facility], [place] [address] [place], [place] [address]    Culture [facility] POSITIVE COCCI  Final   Report Status PENDING  Incomplete  Blood Culture ID Panel (Reflexed)     Status: Abnormal   Collection Time: 06/21/21  2:58 PM  Result Value  Ref Range Status   Enterococcus faecalis NOT DETECTED NOT DETECTED Final   Enterococcus Faecium NOT DETECTED NOT DETECTED Final   Listeria monocytogenes NOT DETECTED NOT DETECTED Final   Staphylococcus species DETECTED (A) NOT DETECTED Final    Comment: CRITICAL RESULT CALLED TO, READ BACK BY AND VERIFIED WITH: Otto Herb PHARMD [id number] 06/22/21 HNM    Staphylococcus aureus (BCID) NOT DETECTED NOT DETECTED Final   Staphylococcus epidermidis NOT DETECTED NOT DETECTED Final   Staphylococcus lugdunensis NOT DETECTED NOT DETECTED Final   Streptococcus species NOT DETECTED NOT DETECTED Final   Streptococcus agalactiae NOT DETECTED NOT DETECTED Final   Streptococcus pneumoniae NOT DETECTED NOT DETECTED Final   Streptococcus pyogenes NOT DETECTED NOT DETECTED Final   A.calcoaceticus-baumannii NOT DETECTED NOT DETECTED Final   Bacteroides fragilis NOT DETECTED NOT DETECTED Final   Enterobacterales NOT DETECTED NOT DETECTED Final    Enterobacter cloacae complex NOT DETECTED NOT DETECTED Final   Escherichia coli NOT DETECTED NOT DETECTED Final   Klebsiella aerogenes NOT DETECTED NOT DETECTED Final   Klebsiella oxytoca NOT DETECTED NOT DETECTED Final   Klebsiella pneumoniae NOT DETECTED NOT DETECTED Final   Proteus species NOT DETECTED NOT DETECTED Final   Salmonella species NOT DETECTED NOT DETECTED Final   Serratia marcescens NOT DETECTED NOT DETECTED Final   Haemophilus influenzae NOT DETECTED NOT DETECTED Final   Neisseria meningitidis NOT DETECTED NOT DETECTED Final   Pseudomonas aeruginosa NOT DETECTED NOT DETECTED Final   Stenotrophomonas maltophilia NOT DETECTED NOT DETECTED Final   Candida albicans NOT DETECTED NOT DETECTED Final   Candida auris NOT DETECTED NOT DETECTED Final   Candida glabrata NOT DETECTED NOT DETECTED Final   Candida krusei NOT DETECTED NOT DETECTED Final   Candida parapsilosis NOT DETECTED NOT DETECTED Final   Candida tropicalis NOT DETECTED NOT DETECTED Final   Cryptococcus neoformans/gattii NOT DETECTED NOT DETECTED Final    Comment: Performed at [facility], [address] [place], [place] [address]  MRSA Next Gen by PCR, Nasal     Status: None   Collection Time: 06/21/21  6:25 PM   Specimen: Nasal Mucosa; Nasal Swab  Result Value Ref Range Status   MRSA by PCR Next Gen NOT DETECTED NOT DETECTED Final    Comment: (NOTE) The GeneXpert MRSA Assay (FDA approved for NASAL specimens only), is one component of a comprehensive MRSA colonization surveillance program. It is not intended to diagnose MRSA infection nor to guide or monitor treatment for MRSA infections. Test performance is not FDA approved in patients less than 24 years old. Performed at [facility], [address] [place], [place] [address]     Coagulation Studies: No results for input(s): LABPROT, INR in the last 72 hours.  Urinalysis: Recent Labs    06/21/21 1619  COLORURINE YELLOW*   LABSPEC 1.026  PHURINE 5.0  GLUCOSEU >=500*  HGBUR MODERATE*  BILIRUBINUR NEGATIVE  KETONESUR NEGATIVE  PROTEINUR 30*  NITRITE NEGATIVE  LEUKOCYTESUR NEGATIVE      Imaging: DG Chest 2 View  Result Date: 06/21/2021 CLINICAL DATA:  Hypoxia.  Weakness. EXAM: CHEST - 2 VIEW COMPARISON:  06/09/2021 and older studies. FINDINGS: Irregular bronchial wall thickening in the right lower lobe with subtle intervening hazy airspace opacities. This is new from the prior chest radiograph. Lungs are hyperexpanded but otherwise clear. No pleural effusion or pneumothorax. Cardiac silhouette is normal in size. No mediastinal or hilar masses or evidence of adenopathy. Skeletal structures are intact. IMPRESSION: 1. Bronchial wall thickening with subtle  intervening hazy airspace opacities in the right lower lobe. This is consistent with bronchopneumonia in the proper clinical setting. 2. No other evidence of acute cardiopulmonary disease. Electronically Signed   By: Lajean Manes M.D.   On: 06/21/2021 13:35   CT HEAD WO CONTRAST (5MM)  Result Date: 06/21/2021 CLINICAL DATA:  Patient himself somewhat poor historian but does identify the year is 2022 and is month of December. He cannot really give me a good history as to why he came today however family apparently on scene reported to EMS the patient having confusion with elevated blood sugar. EMS blood sugar was initially greater than 500 nurses report he did receive IV fluids with EMS his blood sugar is now returned down into the high 300 range. Unknown when symptoms first started EXAM: CT HEAD WITHOUT CONTRAST TECHNIQUE: Contiguous axial images were obtained from the base of the skull through the vertex without intravenous contrast. COMPARISON:  06/05/2021. FINDINGS: Brain: No evidence of acute infarction, hemorrhage, hydrocephalus, extra-axial collection or mass lesion/mass effect. Ventricular and sulcal enlargement reflecting mild diffuse atrophy, unchanged.  Small focus of hypoattenuation in the right parietal lobe consistent with an old infarct, also stable. Vascular: No hyperdense vessel or unexpected calcification. Skull: Normal. Negative for fracture or focal lesion. Sinuses/Orbits: Globes and orbits are unremarkable. Visualized sinuses are clear. Other: None. IMPRESSION: 1. No acute intracranial abnormalities. No change from the recent prior head CT. Electronically Signed   By: Lajean Manes M.D.   On: 06/21/2021 13:37   CT Angio Chest Pulmonary Embolism (PE) W or WO Contrast  Addendum Date: 06/22/2021   ADDENDUM REPORT: 06/22/2021 02:50 ADDENDUM: Discussed case over phone with Dr. Hal Hope at 2:46 a.m., 06/22/2021. CT abdomen and pelvis will be performed to assess for bowel perforation. Electronically Signed   By: Telford Nab M.D.   On: 06/22/2021 02:50   Result Date: 06/22/2021 CLINICAL DATA:  Increasing hypoxemia, suspected pulmonary thromboembolism. EXAM: CT ANGIOGRAPHY CHEST WITH CONTRAST TECHNIQUE: Multidetector CT imaging of the chest was performed using the standard protocol during bolus administration of intravenous contrast. Multiplanar CT image reconstructions and MIPs were obtained to evaluate the vascular anatomy. CONTRAST:  73mL OMNIPAQUE IOHEXOL 350 MG/ML SOLN COMPARISON:  CTA chest 03/12/2021 and chest CT no contrast 10/08/2015. FINDINGS: Cardiovascular: There suboptimal bolus timing. No arterial embolus is seen through the segmental divisions but the subsegmental arteries are unopacified due to bolus timing. There is no arterial dilatation or IVC reflux and no right heart chamber expansion. The heart is normal in size and a small anterior pericardial effusion is again noted. There is no visible coronary artery calcification. There is mild aortic atherosclerosis without aneurysm or dissection with homogeneous enhancement in the normal caliber great vessels. The pulmonary veins are normal caliber. Mediastinum/Nodes: There are mildly  enlarged, increasingly prominent right hilar nodes up to 1.4 cm in short axis probably reactive etiology. There are stable slightly prominent left hilar lymph nodes up to 1.1 cm in short axis. There is increasing prominence of subcarinal nodes up to 1.2 cm in short axis: With precarinal nodal complex larger today at 1.1 cm short axis. No axillary or further mediastinal adenopathy is seen. There is a small hiatal hernia. No esophageal thickening or thyroid mass noted. Lungs/Pleura: Stable biapical scarring with bronchiolectasis. There is mucous plugging or aspirate in some of the bilateral lower lobe posterior basal second and third order bronchi. Aspirated material was previously noted in the right main bronchus. There is interval worsening of patchy dense consolidation  in right lower lobe segments and increasing ground-glass and tree-in-bud infiltrates in a patchy distribution in the left lower lobe, with worsening tree-in-bud opacities peripherally in the right lower lobe and scattered in the posterior segment of the right upper lobe. Linear scar-like opacities are again noted both bases. Upper Abdomen: There are stones posteriorly in the gallbladder but no wall thickening. There is a small amount of scattered free intraperitoneal air anterior to the liver and trace free air within a small hiatal hernia to the right. There is trace free air in the subphrenic space on the left. PEG tube in place was not seen on the last CT. Was this recently placed? Musculoskeletal: No chest wall abnormality. No acute or significant osseous findings. Review of the MIP images confirms the above findings. IMPRESSION: 1. No arterial embolus is seen through the segmental divisions. Subsegmental arteries are unopacified and not evaluated. 2. Mild increased prominence of right hilar and mediastinal lymph nodes, possibly reactive. No bulky or encasing adenopathy. 3. Bilateral lower lobe second and third order basilar bronchial mucous  plugging or aspirate, with worsening opacities in both lower lobes most likely due to bronchopneumonia given the presence of tree-in-bud interstitial opacities and probably aspiration etiology. Additional tree-in-bud opacities have developed in the right upper lobe posterior segment further suggesting an aspiration pneumonitis. 4. Small pericardial effusion. 5. Scattered free air in the upper abdomen. Source indeterminate. If patient has not had a recent procedure such as a laparoscopic procedure or PEG placement, assessment for hollow viscus perforation must be made. 6. Mild aortic atherosclerosis. 7. Cholelithiasis. 8. Ordering physician will be contacted by PRA and this report will be addended at that time. Electronically Signed: By: Telford Nab M.D. On: 06/22/2021 02:41   CT ABDOMEN PELVIS W CONTRAST  Result Date: 06/22/2021 CLINICAL DATA:  Free air in the upper abdomen on CTA chest today. EXAM: CT ABDOMEN AND PELVIS WITH CONTRAST TECHNIQUE: Multidetector CT imaging of the abdomen and pelvis was performed using the standard protocol following bolus administration of intravenous contrast. CONTRAST:  82mL OMNIPAQUE IOHEXOL 300 MG/ML  SOLN COMPARISON:  CTA chest today, and CTs of abdomen and pelvis with contrast 02/16/2021 and 02/09/2020. FINDINGS: Factors affecting image quality: Abundant respiratory motion artifact. Lower chest: Right greater than left lower lobe consolidative airspace disease and bilateral lower lobe bronchial mucus or fluid filling. Additional peripheral tree-in-bud infiltrates concerning for aspiration. Hepatobiliary: There are few scattered tiny hepatic hypodensities which are too small to characterize, calcified granuloma noted in the liver dome. No new abnormality is seen, no mass. There are layering stones in the posterior gallbladder but no wall thickening or biliary dilatation. Pancreas: No mass, adjacent edema, or ductal dilatation. Spleen: No mass or splenomegaly.  Adrenals/Urinary Tract: There are bilateral renal cysts, largest is a stable thinly septated 3.3 cm cyst on the left. There is no renal or adrenal mass enhancement, no hydronephrosis is seen. Contrast partially opacifies the bladder which is normal in thickness. Stomach/Bowel: Small hiatal hernia. PEG tube not seen previously. The first abdomen film on which this was reported was a flat plate single-view of 06/05/2021. The stomach is contracted around the balloon. There is no small bowel thickening or dilatation. The appendix is normal caliber. There are no findings of colitis or diverticulitis. Mild fecal stasis. Vascular/Lymphatic: Extensive aortoiliac calcific plaque, suspect at least 50-60% calcific stenosis distal right common iliac artery, suspected high-grade calcific stenoses in the proximal to mid internal iliac arteries. Suspected additional high-grade calcific stenosis proximal left external  iliac artery. Reproductive: Enlarged prostate, measures 4.8 cm transversely, impresses on the inferior bladder. Other: There is no free fluid. There is a small volume of free air anterior to the liver, trace free air underneath the left hemidiaphragm but there is no free air adjacent the PEG tube. There is a small subcutaneous air locule in the left mid abdominal wall probably a site of recent injection. Musculoskeletal: Osteopenia and degenerative change lumbar spine, including with prominent marginal osteophytes L5-S1 with anterior bridging, mild hip DJD. IMPRESSION: 1. Small amounts of free air in the right anterior upper abdomen, trace free air beneath the left hemidiaphragm. Source indeterminate. Unclear if related to a PEG tube leak or bowel perforation but there is no further free air. If the PEG was placed December 2, there should no longer be intraperitoneal free air from that procedure. The mesentery is unremarkable and does not show focal inflammatory changes. Consultation with a surgeon is recommended. 2.  Right-greater-than-left lower lobe infiltrates and bronchial mucus or fluid filling. Aspiration precautions recommended. 3. Tiny hepatic hypodensities which are too small to characterize but unchanged. 4. Constipation and diverticulosis. 5. Heavy aortoiliac calcification with stenoses as above. 6. Renal cysts including a thinly septated left renal cyst. 7. Prostatomegaly. 8. Cholelithiasis. Electronically Signed   By: Telford Nab M.D.   On: 06/22/2021 03:50   DG ABDOMEN PEG TUBE LOCATION  Result Date: 06/22/2021 CLINICAL DATA:  Peg tube verification EXAM: ABDOMEN - 1 VIEW COMPARISON:  Abdominal CT from earlier today FINDINGS: Percutaneous gastrostomy tube injection opacifies proximal small bowel. Catheter and retention balloon were at the stomach by prior CT. Urinary collecting system contrast from prior CT. Known pulmonary infiltrates at the bases. IMPRESSION: Located percutaneous gastrostomy tube. No extravasation of the injected contrast. Electronically Signed   By: Jorje Guild M.D.   On: 06/22/2021 06:48   DG Chest Port 1 View  Result Date: 06/21/2021 CLINICAL DATA:  Dyspnea EXAM: PORTABLE CHEST 1 VIEW COMPARISON:  06/21/2021, 06/09/2021 FINDINGS: Interval progression of bibasilar pulmonary infiltrate, more focal within the right lower lobe, in keeping with multifocal infection or aspiration in the acute setting. No pneumothorax or pleural effusion. Cardiac size within normal limits. Pulmonary vascularity is normal. No acute bone abnormality. IMPRESSION: Progressive bibasilar pulmonary infiltrate, more extensive within the right lung base. Electronically Signed   By: Fidela Salisbury M.D.   On: 06/21/2021 22:58     Medications:    ampicillin-sulbactam (UNASYN) IV Stopped (06/23/21 0430)   dextrose 125 mL/hr at 06/23/21 0546    acetylcysteine  1 mL Nebulization Once   atorvastatin  20 mg Per Tube Daily   Chlorhexidine Gluconate Cloth  6 each Topical Q0600   enoxaparin (LOVENOX) injection   40 mg Subcutaneous QHS   famotidine  20 mg Per Tube Daily   feeding supplement (JEVITY 1.2 CAL)  237 mL Per Tube 6 X Daily   free water  100 mL Per Tube 6 X Daily   insulin aspart  0-9 Units Subcutaneous Q4H   acetaminophen (TYLENOL) oral liquid 160 mg/5 mL, albuterol, guaiFENesin, ondansetron (ZOFRAN) IV  Assessment/ Plan:  70 y.o. male with a PMHx of oropharyngeal cancer status post G-tube placement, diabetes mellitus type 2, hyperlipidemia, GERD, former tobacco abuse, who was admitted to [facility] on 06/21/2021 for evaluation of cough as well as shortness of breath.     1.  Severe hypernatremia.  Free water deficit improved.  Serum sodium down to 154.  Continue D5W at 125 cc/h for at least  another 24 hours.  Also agree with giving free water flush 100 cc every 4 hours.  Continue to monitor serum sodium.  2.  Acute kidney injury.  Both BUN and creatinine have come down a bit.  Continue to monitor.  3.  Acute respiratory failure secondary to pneumonia.  Continue high flow nasal cannula for now.   LOS: 2 Dalia Jollie 12/20/20227:46 AM

## 2021-06-23 NOTE — Progress Notes (Signed)
NAME:  Louis Stone, MRN:  [id number], DOB:  05/10/1951, LOS: 2 ADMISSION DATE:  06/21/2021, CONSULTATION DATE:  06/22/2021 REFERRING MD:  Dr.Kakrakandy , CHIEF COMPLAINT:  Hypoxia   History of Present Illness:  70yo male w/ a past medical history of oropharynx cancer s/p G-tube placement, Type II diabetes, HLD, GERD and former smoker who presented to the ED 12/18 with a cough and shortness of breath.   Per the medical record, the patient has been having these symptoms for a few days. Denies fevers. ED workup revealed: elevated glucose of 500, WBC 11.4, Cr 1.58, Na 153, chest xray which revealed right lower lobe infiltrate concerning for aspiration pneumonia and initially hypoxic w/sats of 88% that improved with 2L [place].   The patients received 1L IVFs, unasyn and was started on IVFs at 75cc/hr. Cultures sent. COVID and Flu negative. Admitted to the hospitalist service.   Overnight, the patient became increasingly hypoxic requiring 10L high flow nasal cannula with oxygen saturations in the 70s-80s. Critical care was called to evaluate. On exam, patient was in no distress. He was alert and restless at the time of my examination. He is difficult to understand but denies any shortness of breath. He is no obvious respiratory distress. HR 120s, oxygen saturation reading 90% but poor pleth otherwise VSS.   Ordered a stat ABG which revealed: PaO2 55 and oxygen saturation of 88%. Patient placed on Optiflow.   Pertinent  Medical History  Type II Diabetes Oropharynx cancer s/p Gtube placement Hyperlipidemia Hypertension  Micro Data:  06/21/21: SARS-CoV-2 & Influenza PCR>>negative 06/21/21: Strep pneumo urinary antigen>>negative 06/21/21: Blood culture>> 1/2 with Staph Hominis + Haemolyticus (? contaminate) 06/21/21: MRSA PCR>>negative  Antimicrobials:  Cefepime 12/18 x1 dose Vancomycin 12/18 x1 dose Unasyn 12/18>>  Significant Hospital Events: Including procedures, antibiotic start and  stop dates in addition to other pertinent events   12/19: Admitted. Optiflow. CTA negative for PE, did show pneumoperitoneum.  Surgery consulted, free air felt to be due to PEG tube.  Nephrology consulted for AKI and Hypernatremia 12/20: Weaned to 3L [place].  AKI and Hypernatremia improving.  Interim History / Subjective:  -No significant events noted overnight -Afebrile, hemodynamically stable -Weaned to 3L [place] -General Surgery evaluated yesterday due to pneumoperitoneum noted on CTA Chest and re-demonstrated on CT Abdomen/Pelvis ~ felt to be due to small amount of air leak somewhere along his PEG tube tract while the tube was not flush with the abdominal wall -Ok to use PEG tube per Surgery reevaluation yesterday evening -Blood cultures with Staph Hominus + Haemolyticus (? Contaminant) -Hypernatremia improved to 154 (161 yesterday) ~ Nephrology following, recommends to continue D5W @ 125 ml/hr along with free water flushes 100 cc q4h  Objective   Blood pressure 121/82, pulse (!) 101, temperature 98.1 F (36.7 C), temperature source Oral, resp. rate (!) 25, height 5\' 7"  (1.702 m), weight 48.2 kg, SpO2 96 %.    FiO2 (%):  [40 %-50 %] 40 %   Intake/Output Summary (Last 24 hours) at 06/23/2021 3536 Last data filed at 06/23/2021 1443 Gross per 24 hour  Intake 3113.25 ml  Output 300 ml  Net 2813.25 ml    Filed Weights   06/21/21 1239 06/22/21 0331  Weight: 54.4 kg 48.2 kg    Examination: General: Acute on chronically ill-appearing male, sitting in bed, on nasal cannula, no acute distress HENT: pupils equal and reactive, sclera clear Lungs: Lungs diminished throughout, no wheezing or rales noted, even, nonlabored Cardiovascular: Regular rate and rhythm, S1S2,  no edema, warm and well perfused Abdomen: soft, non-distended, non-tender, Gtube in place Extremities: Palpable pulses x4  Neuro: Alert and oriented, difficult to understand and poor historian GU: No foley  Resolved Hospital  Problem list     Assessment & Plan:   Acute hypoxemic respiratory failure Suspect Aspiration Pneumonia -Supplemental O2 as needed to maintain O2 sats  >92% -Follow intermittent CXR & ABG as needed -CTA Chest 12/19: 1. No arterial embolus is seen through the segmental divisions. Subsegmental arteries are unopacified and not evaluated. 2. Mild increased prominence of right hilar and mediastinal lymph nodes, possibly reactive. No bulky or encasing adenopathy. 3. Bilateral lower lobe second and third order basilar bronchial mucous plugging or aspirate, with worsening opacities in both lower lobes most likely due to bronchopneumonia given the presence of tree-in-bud interstitial opacities and probably aspiration etiology. Additional tree-in-bud opacities have developed in the right upper lobe posterior segment further suggesting an aspiration pneumonitis. 4. Small pericardial effusion. -Last ECHO 07/2020: EF 50-55%, normal RV function, no wall motion abnormalities, valves not well visualized -Continue Unasyn -PRN Duonebs  Suspected Aspiration Pneumonia -Monitor fever curve -Trend WBC's & Procalcitonin -Follow cultures as above -Continue empiric Unasyn pending cultures & sensitivities  Acute Kidney Injury Hypernatremia -Monitor I&O's / urinary output -Follow BMP -Ensure adequate renal perfusion -Avoid nephrotoxic agents as able -Replace electrolytes as indicated -Nephrology following, appreciate input -Continue D5W @ 125 ml/hr and free water flushes 100 cc q4h as per Nephrology  Type II Diabetes Hyperglycemia -CBG's q4h; Target range of 140 to 180 -SSI and Glargine -Follow ICU Hypo/Hyperglycemia protocol -Poorly controlled, last HgbA1c 6.8%  Hypertension Hyperlipidemia --Home medications: Lipitor and Metoprolol --Hold metoprolol for now but will continue Lipitor  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  All critical care needs have resolved.  PCCM will sign off at this time.  Please re-consult is any critical care needs should arise or if we can be of further assistance.  Labs   CBC: Recent Labs  Lab 06/21/21 1244 06/22/21 0545 06/23/21 0521  WBC 11.4* 9.8 11.4*  HGB 13.9 14.1 12.1*  HCT 44.8 46.0 39.2  MCV 83.4 83.8 84.8  PLT 243 227 192     Basic Metabolic Panel: Recent Labs  Lab 06/21/21 1244 06/22/21 0151 06/22/21 0545 06/22/21 1646 06/23/21 0521  NA 153* 157* 162* 161* 154*  K 4.4 2.5* 3.8 4.0 3.5  CL 115* >130* 123* 123* 119*  CO2 29 21* 31 30 28   GLUCOSE 407* 130* 131* 270* 390*  BUN 77* 45* 56* 53* 38*  CREATININE 1.52* 0.76 1.15 1.33* 1.11  CALCIUM 9.9 6.4* 9.6 9.4 9.1  MG  --   --   --   --  2.6*  PHOS  --   --   --   --  2.4*    GFR: Estimated Creatinine Clearance: 42.8 mL/min (by C-G formula based on SCr of 1.11 mg/dL). Recent Labs  Lab 06/21/21 1244 06/21/21 1458 06/21/21 1824 06/22/21 0545 06/23/21 0521  PROCALCITON 5.35  --   --   --   --   WBC 11.4*  --   --  9.8 11.4*  LATICACIDVEN  --  2.2* 1.8  --   --  Liver Function Tests: Recent Labs  Lab 06/21/21 1244  AST 28  ALT 114*  ALKPHOS 101  BILITOT 0.8  PROT 8.8*  ALBUMIN 3.4*    No results for input(s): LIPASE, AMYLASE in the last 168 hours. No results for input(s): AMMONIA in the last 168 hours.  ABG    Component Value Date/Time   PHART 7.48 (H) 06/22/2021 0616   PCO2ART 41 06/22/2021 0616   PO2ART 238 (H) 06/22/2021 0616   HCO3 30.5 (H) 06/22/2021 0616   TCO2 35 (H) 07/17/2020 0038   ACIDBASEDEF 1.3 02/01/2021 1209   O2SAT 99.9 06/22/2021 0616      Coagulation Profile: No results for  input(s): INR, PROTIME in the last 168 hours.  Cardiac Enzymes: No results for input(s): CKTOTAL, CKMB, CKMBINDEX, TROPONINI in the last 168 hours.  HbA1C: Hgb A1c MFr Bld  Date/Time Value Ref Range Status  02/02/2021 12:38 AM 6.8 (H) 4.8 - 5.6 % Final    Comment:    (NOTE) Pre diabetes:          5.7%-6.4%  Diabetes:              >6.4%  Glycemic control for   <7.0% adults with diabetes   07/20/2020 03:25 AM 6.6 (H) 4.8 - 5.6 % Final    Comment:    (NOTE) Pre diabetes:          5.7%-6.4%  Diabetes:              >6.4%  Glycemic control for   <7.0% adults with diabetes     CBG: Recent Labs  Lab 06/22/21 1521 06/22/21 1927 06/22/21 2357 06/23/21 0353 06/23/21 0730  GLUCAP 219* 283* 367* 328* 335*     Review of Systems:   Positives in BOLD: Pt denies all complaints Gen: Denies fever, chills, weight change, fatigue, night sweats HEENT: Denies blurred vision, double vision, hearing loss, tinnitus, sinus congestion, rhinorrhea, sore throat, neck stiffness, dysphagia PULM: Denies shortness of breath, cough, sputum production, hemoptysis, wheezing CV: Denies chest pain, edema, orthopnea, paroxysmal nocturnal dyspnea, palpitations GI: Denies abdominal pain, nausea, vomiting, diarrhea, hematochezia, melena, constipation, change in bowel habits GU: Denies dysuria, hematuria, polyuria, oliguria, urethral discharge Endocrine: Denies hot or cold intolerance, polyuria, polyphagia or appetite change Derm: Denies rash, dry skin, scaling or peeling skin change Heme: Denies easy bruising, bleeding, bleeding gums Neuro: Denies headache, numbness, weakness, slurred speech, loss of memory or consciousness   Past Medical History:  He,  has a past medical history of Arthritis, Diabetes mellitus without complication ([place]), Oropharynx cancer ([place]) (2009), and Personal history of tobacco use, presenting hazards to health (10/08/2015).   Surgical History:   Past Surgical History:   Procedure Laterality Date   IR GASTROSTOMY TUBE MOD SED  03/18/2021   IR ME[place] REMOV OBSTRUC MAT ANY COLON TUBE W/FLUORO  04/15/2021   IR [place] GASTRO/COLONIC TUBE PERCUT W/FLUORO  03/25/2021   THORACIC DISC SURGERY  1994   pt states ruptured disc and had surgery to repair it     Social History:   reports that he quit smoking about 14 years ago. His smoking use included cigarettes. He has a 60.00 pack-year smoking history. He has never used smokeless tobacco. He reports that he does not drink alcohol and does not use drugs.   Family History:  His family history includes CVA in his father. There is no history of Prostate cancer.   Allergies No Known Allergies   Home Medications  Prior to Admission medications  Medication Sig Start Date End Date Taking? Authorizing Provider  acetaminophen (TYLENOL) 325 MG tablet Take 650 mg by mouth every 6 (six) hours as needed for headache or mild pain.   Yes [provider]  albuterol (PROVENTIL HFA;VENTOLIN HFA) 108 (90 Base) MCG/ACT inhaler Inhale 2 puffs into the lungs every 6 (six) hours as needed for wheezing.   Yes [provider]  albuterol (PROVENTIL) (2.5 MG/3ML) 0.083% nebulizer solution Inhale 3 mLs into the lungs every 6 (six) hours as needed for wheezing. 06/09/21  Yes Swayze, Ava, DO  guaiFENesin (MUCINEX) 600 MG 12 hr tablet Take 2 tablets (1,200 mg total) by mouth 2 (two) times daily. 06/09/21 06/09/22 Yes Swayze, Ava, DO  omeprazole (PRILOSEC) 40 MG capsule Take 40 mg by mouth daily. 06/06/20  Yes [provider]  TOUJEO SOLOSTAR 300 UNIT/ML Solostar Pen Inject 15 Units into the skin daily. Patient taking differently: Inject 25 Units into the skin at bedtime. 07/21/20  Yes Dahal, Marlowe Aschoff, MD  Water For Irrigation, Sterile (FREE WATER) SOLN Place 100 mLs into feeding tube 6 (six) times daily. 03/21/21  Yes Wouk, Ailene Rud, MD  atorvastatin (LIPITOR) 20 MG tablet Place 1 tablet (20 mg total) into feeding tube daily.  03/21/21   Wouk, Ailene Rud, MD  famotidine (PEPCID) 40 MG tablet Place 1 tablet (40 mg total) into feeding tube daily. 03/21/21   Wouk, Ailene Rud, MD  metoprolol succinate (TOPROL-XL) 25 MG 24 hr tablet Take 0.5 tablets (12.5 mg total) by mouth daily. 06/10/21   Swayze, Ava, DO  Nutritional Supplements (FEEDING SUPPLEMENT, JEVITY 1.5 CAL/FIBER,) LIQD Place 237 mLs into feeding tube 6 (six) times daily. 06/09/21   Swayze, Ava, DO     Care time: 40 minutes    Darel Hong, AGACNP-BC [facility] Prefer epic messenger for cross cover needs If after hours, please call E-link

## 2021-06-23 NOTE — Plan of Care (Signed)

## 2021-06-24 DIAGNOSIS — G9341 Metabolic encephalopathy: Secondary | ICD-10-CM | POA: Diagnosis not present

## 2021-06-24 DIAGNOSIS — J690 Pneumonitis due to inhalation of food and vomit: Secondary | ICD-10-CM | POA: Diagnosis not present

## 2021-06-24 DIAGNOSIS — J189 Pneumonia, unspecified organism: Secondary | ICD-10-CM | POA: Diagnosis not present

## 2021-06-24 DIAGNOSIS — J9601 Acute respiratory failure with hypoxia: Secondary | ICD-10-CM | POA: Diagnosis not present

## 2021-06-24 LAB — BASIC METABOLIC PANEL
Anion gap: 6 (ref 5–15)
BUN: 30 mg/dL — ABNORMAL HIGH (ref 8–23)
CO2: 31 mmol/L (ref 22–32)
Calcium: 9 mg/dL (ref 8.9–10.3)
Chloride: 116 mmol/L — ABNORMAL HIGH (ref 98–111)
Creatinine, Ser: 0.99 mg/dL (ref 0.61–1.24)
GFR, Estimated: >60 mL/min (ref >60)
Glucose, Bld: 135 mg/dL — ABNORMAL HIGH (ref 70–99)
Potassium: 3.6 mmol/L (ref 3.5–5.1)
Sodium: 153 mmol/L — ABNORMAL HIGH (ref 135–145)

## 2021-06-24 LAB — GLUCOSE, CAPILLARY
Glucose-Capillary: 121 mg/dL — ABNORMAL HIGH (ref 70–99)
Glucose-Capillary: 159 mg/dL — ABNORMAL HIGH (ref 70–99)
Glucose-Capillary: 228 mg/dL — ABNORMAL HIGH (ref 70–99)
Glucose-Capillary: 228 mg/dL — ABNORMAL HIGH (ref 70–99)
Glucose-Capillary: 239 mg/dL — ABNORMAL HIGH (ref 70–99)
Glucose-Capillary: 245 mg/dL — ABNORMAL HIGH (ref 70–99)
Glucose-Capillary: 316 mg/dL — ABNORMAL HIGH (ref 70–99)

## 2021-06-24 LAB — PHOSPHORUS: Phosphorus: 3.3 mg/dL (ref 2.5–4.6)

## 2021-06-24 LAB — SODIUM
Sodium: 148 mmol/L — ABNORMAL HIGH (ref 135–145)
Sodium: 151 mmol/L — ABNORMAL HIGH (ref 135–145)

## 2021-06-24 LAB — MAGNESIUM: Magnesium: 2.4 mg/dL (ref 1.7–2.4)

## 2021-06-24 MED ORDER — FREE WATER
30.0000 mL | Status: DC
Start: 2021-06-24 — End: 2021-06-24

## 2021-06-24 MED ORDER — AMOXICILLIN-POT CLAVULANATE 875-125 MG PO TABS
1.0000 | ORAL_TABLET | Freq: Two times a day (BID) | ORAL | Status: DC
  Filled 2021-06-24: qty 1

## 2021-06-24 MED ORDER — PANTOPRAZOLE SODIUM 40 MG IV SOLR
40.0000 mg | Freq: Once | INTRAVENOUS | Status: AC
  Administered 2021-06-24: 05:00:00 40 mg via INTRAVENOUS
  Filled 2021-06-24: qty 40

## 2021-06-24 MED ORDER — FREE WATER
200.0000 mL | Status: DC
Start: 2021-06-24 — End: 2021-06-25
  Administered 2021-06-24 – 2021-06-25 (×4): 200 mL

## 2021-06-24 MED ORDER — JEVITY 1.5 CAL/FIBER PO LIQD
1000.0000 mL | ORAL | Status: DC
  Administered 2021-06-24 – 2021-06-25 (×2): 1000 mL

## 2021-06-24 MED ORDER — AMOXICILLIN-POT CLAVULANATE 875-125 MG PO TABS
1.0000 | ORAL_TABLET | Freq: Two times a day (BID) | ORAL | Status: DC
  Administered 2021-06-24 – 2021-06-25 (×2): 1
  Filled 2021-06-24 (×2): qty 1

## 2021-06-24 NOTE — Progress Notes (Addendum)
PROGRESS NOTE   Louis Stone  DJT:[id number] DOB: 09/02/1950 DOA: 06/21/2021 PCP: Theotis Burrow, MD  Brief Narrative:  70 year old community dwelling black male History TN2 SCC oral cancer 2009-aspiration pneumonia + severe dysphagia on 03/12/2021 admission-was using PEG tube at the time Recent admission 12/2 through 06/09/2021 with profound hypoglycemia discharged with home health  Return to emergency room by EMS with CBG over 500 and was disoriented WBC 11.4 anion gap 9 CXR right lower lobe infiltrate CT head negative for stroke  Patient has lost 20 pounds was using Ensure but from store  Developed oxygen requirement-ultimately placed on BiPAP Found on scans 12/19 with pneumoperitoneum--General surgery consulted but this was felt to be possibly air leakage around G-tube  Nephrology consulted in addition  Hospital-Problem based course  Acute hypoxic respiratory failure secondary to aspiration pneumonia Emphysema with prior smoking Requiring less oxygen than prior-documented as 93% on 2 L but will need oxygen on discharge Transition from Unasyn to Augmentin 12/21 total treatment time 10 days ending 07/06/2021 I will ask SLP to see patient in consult and reinforce n.p.o. status and see if he has made any improvement in strength although he would likely will remain n.p.o. Squamous cell oral cancer diagnosed 2009 G-tube dependent Given severe dysphagia recommend strict n.p.o. until gains weight or become stronger and can be rechecked as an outpatient Feeds as per nutritionist Poorly controlled diabetes mellitus with DKA this admission and profound hypoglycemia last admission A1c 6.8 CBGs 150--360 Continue Lantus 22 units, continue moderate coverage every 4 checks AKI on admission, severe hyponatremia on admission--Baseline creatinine 0.6-- on admission 1.5 Sodium now trending downwards to 154--continue quarter normal saline 75 cc/H  increase free water to 200 every 4   Adult failure to thrive in the setting of cancer   DVT prophylaxis: Lovenox Code Status: Full Family Communication: None present Disposition:  Status is: Inpatient  Remains inpatient appropriate because: Still hypernatremic       Consultants:  Multiple  Procedures:   Antimicrobials:     Subjective: Apparently the patient was not very talkative with palliative care-he seems to think he can manage at home it is noted that the patient just wants to go home and "drink milk" He is at very high risk for recurrent admission if he takes anything by mouth as he has severe dysphagia  Objective: Vitals:   06/24/21 0808 06/24/21 0810 06/24/21 1144 06/24/21 1541  BP: 119/70  107/61 (!) 108/59  Pulse: (!) 105  (!) 106 (!) 107  Resp: 20  20 18   Temp: 99.3 F (37.4 C)  97.6 F (36.4 C) 98.3 F (36.8 C)  TempSrc:      SpO2: 93% 93% 91% 93%  Weight:      Height:        Intake/Output Summary (Last 24 hours) at 06/24/2021 1625 Last data filed at 06/24/2021 1120 Gross per 24 hour  Intake --  Output 350 ml  Net -350 ml   Filed Weights   06/21/21 1239 06/22/21 0331  Weight: 54.4 kg 48.2 kg    Examination:  EOMI NCAT no focal deficit Cachectic slightly sleepy listless ill-appearing Clear no added sound no rales no rhonchi S1-S2 slightly tacky PEG tube in place No lower extremity edema Sacrum not examined S1-S2 no murmur  Data Reviewed: personally reviewed   CBC    Component Value Date/Time   WBC 11.4 (H) 06/23/2021 0521   RBC 4.62 06/23/2021 0521   HGB 12.1 (L) 06/23/2021 0521   HCT 39.2  06/23/2021 0521   PLT 192 06/23/2021 0521   MCV 84.8 06/23/2021 0521   MCH 26.2 06/23/2021 0521   MCHC 30.9 06/23/2021 0521   RDW 14.6 06/23/2021 0521   LYMPHSABS 1.2 06/09/2021 0436   MONOABS 0.5 06/09/2021 0436   EOSABS 0.1 06/09/2021 0436   BASOSABS 0.0 06/09/2021 0436   CMP Latest Ref Rng & Units 06/24/2021 06/24/2021 06/24/2021  Glucose 70 - 99 mg/dL - 135(H) -   BUN 8 - 23 mg/dL - 30(H) -  Creatinine 0.61 - 1.24 mg/dL - 0.99 -  Sodium 135 - 145 mmol/L 148(H) 153(H) 151(H)  Potassium 3.5 - 5.1 mmol/L - 3.6 -  Chloride 98 - 111 mmol/L - 116(H) -  CO2 22 - 32 mmol/L - 31 -  Calcium 8.9 - 10.3 mg/dL - 9.0 -  Total Protein 6.5 - 8.1 g/dL - - -  Total Bilirubin 0.3 - 1.2 mg/dL - - -  Alkaline Phos 38 - 126 U/L - - -  AST 15 - 41 U/L - - -  ALT 0 - 44 U/L - - -     Radiology Studies: No results found.   Scheduled Meds:  acetylcysteine  1 mL Nebulization Once   atorvastatin  20 mg Per Tube Daily   Chlorhexidine Gluconate Cloth  6 each Topical Q0600   enoxaparin (LOVENOX) injection  40 mg Subcutaneous QHS   famotidine  20 mg Per Tube Daily   free water  30 mL Per Tube Q4H   insulin aspart  0-15 Units Subcutaneous Q4H   insulin aspart  0-5 Units Subcutaneous QHS   insulin glargine-yfgn  22 Units Subcutaneous Daily   Continuous Infusions:  ampicillin-sulbactam (UNASYN) IV 3 g (06/24/21 1054)   feeding supplement (JEVITY 1.5 CAL/FIBER)     sodium chloride 0.225 % (1/4 NS) NICU IV infusion 75 mL/hr at 06/24/21 0856     LOS: 3 days   Time spent: 27  Nita Sells, MD [facility] To contact the attending provider between 7A-7P or the covering provider during after hours 7P-7A, please log into the web site [email or web address] and access using universal [place] password for that web site. If you do not have the password, please call the hospital operator.  06/24/2021, 4:25 PM

## 2021-06-24 NOTE — Progress Notes (Signed)
[facility]  ROUNDING NOTE   Subjective:   Patient seen resting quietly in bed Sodium increased to 153, latest check 148 IVF- 0.225% saline at 75 ml/hr Currently Npo Creatinine 0.99 Urine output of 1.3L in previous 24 hours  Objective:  Vital signs in last 24 hours:  Temp:  [97.6 F (36.4 C)-99.4 F (37.4 C)] 98.3 F (36.8 C) (12/21 1541) Pulse Rate:  [101-108] 107 (12/21 1541) Resp:  [16-20] 18 (12/21 1541) BP: (104-119)/(57-77) 108/59 (12/21 1541) SpO2:  [90 %-96 %] 93 % (12/21 1541)  Weight change:  Filed Weights   06/21/21 1239 06/22/21 0331  Weight: 54.4 kg 48.2 kg    Intake/Output: I/O last 3 completed shifts: In: 3340.7 [I.V.:2840.7; IV Piggyback:500] Out: 1600 [Urine:1600]   Intake/Output this shift:  Total I/O In: -  Out: 100 [Urine:100]  Physical Exam: General: Cachectic appearing male  Head: Normocephalic, atraumatic. Moist oral mucosal membranes  Eyes: Anicteric  Lungs:  Basilar rales and wheeze, normal effort  Heart: S1S2 no rubs  Abdomen:  Soft, nontender, bowel sounds present  Extremities: No peripheral edema.  Neurologic: Awake, alert, following commands  Skin: No acute rash  Access: No hemodialysis access    Basic Metabolic Panel: Recent Labs  Lab 06/22/21 0151 06/22/21 0545 06/22/21 1646 06/23/21 0521 06/23/21 1158 06/23/21 1545 06/24/21 0005 06/24/21 0435 06/24/21 1439  NA 157* 162* 161* 154*  --  154* 151* 153* 148*  K 2.5* 3.8 4.0 3.5  --   --   --  3.6  --   CL >130* 123* 123* 119*  --   --   --  116*  --   CO2 21* 31 30 28   --   --   --  31  --   GLUCOSE 130* 131* 270* 390* 461*  --   --  135*  --   BUN 45* 56* 53* 38*  --   --   --  30*  --   CREATININE 0.76 1.15 1.33* 1.11  --   --   --  0.99  --   CALCIUM 6.4* 9.6 9.4 9.1  --   --   --  9.0  --   MG  --   --   --  2.6*  --   --   --  2.4  --   PHOS  --   --   --  2.4*  --   --   --  3.3  --      Liver Function Tests: Recent Labs  Lab 06/21/21 1244   AST 28  ALT 114*  ALKPHOS 101  BILITOT 0.8  PROT 8.8*  ALBUMIN 3.4*    No results for input(s): LIPASE, AMYLASE in the last 168 hours. No results for input(s): AMMONIA in the last 168 hours.  CBC: Recent Labs  Lab 06/21/21 1244 06/22/21 0545 06/23/21 0521  WBC 11.4* 9.8 11.4*  HGB 13.9 14.1 12.1*  HCT 44.8 46.0 39.2  MCV 83.4 83.8 84.8  PLT 243 227 192     Cardiac Enzymes: No results for input(s): CKTOTAL, CKMB, CKMBINDEX, TROPONINI in the last 168 hours.  BNP: Invalid input(s): POCBNP  CBG: Recent Labs  Lab 06/23/21 2004 06/24/21 0014 06/24/21 0435 06/24/21 0806 06/24/21 1143  GLUCAP 161* 245* 121* 159* 30*     Microbiology: Results for orders placed or performed during the hospital encounter of 06/21/21  Resp Panel by RT-PCR (Flu A&B, Covid) Nasopharyngeal Swab     Status: None  Collection Time: 06/21/21  2:58 PM   Specimen: Nasopharyngeal Swab; Nasopharyngeal(NP) swabs in vial transport medium  Result Value Ref Range Status   SARS Coronavirus 2 by RT PCR NEGATIVE NEGATIVE Final    Comment: (NOTE) SARS-CoV-2 target nucleic acids are NOT DETECTED.  The SARS-CoV-2 RNA is generally detectable in upper respiratory specimens during the acute phase of infection. The lowest concentration of SARS-CoV-2 viral copies this assay can detect is 138 copies/mL. A negative result does not preclude SARS-Cov-2 infection and should not be used as the sole basis for treatment or other patient management decisions. A negative result may occur with  improper specimen collection/handling, submission of specimen other than nasopharyngeal swab, presence of viral mutation(s) within the areas targeted by this assay, and inadequate number of viral copies(<138 copies/mL). A negative result must be combined with clinical observations, patient history, and epidemiological information. The expected result is Negative.  Fact Sheet for Patients:   EntrepreneurPulse.com.au  Fact Sheet for Healthcare Providers:  IncredibleEmployment.be  This test is no t yet approved or cleared by the [place] FDA and  has been authorized for detection and/or diagnosis of SARS-CoV-2 by FDA under an Emergency Use Authorization (EUA). This EUA will remain  in effect (meaning this test can be used) for the duration of the COVID-19 declaration under Section 564(b)(1) of the Act, 21 U.S.C.section 360bbb-3(b)(1), unless the authorization is terminated  or revoked sooner.       Influenza A by PCR NEGATIVE NEGATIVE Final   Influenza B by PCR NEGATIVE NEGATIVE Final    Comment: (NOTE) The Xpert Xpress SARS-CoV-2/FLU/RSV plus assay is intended as an aid in the diagnosis of influenza from Nasopharyngeal swab specimens and should not be used as a sole basis for treatment. Nasal washings and aspirates are unacceptable for Xpert Xpress SARS-CoV-2/FLU/RSV testing.  Fact Sheet for Patients: EntrepreneurPulse.com.au  Fact Sheet for Healthcare Providers: IncredibleEmployment.be  This test is not yet approved or cleared by the [place] FDA and has been authorized for detection and/or diagnosis of SARS-CoV-2 by FDA under an Emergency Use Authorization (EUA). This EUA will remain in effect (meaning this test can be used) for the duration of the COVID-19 declaration under Section 564(b)(1) of the Act, 21 U.S.C. section 360bbb-3(b)(1), unless the authorization is terminated or revoked.  Performed at [facility], [address] [place], [place] [address]   Culture, blood (Routine X 2) w Reflex to ID Panel     Status: None (Preliminary result)   Collection Time: 06/21/21  2:58 PM   Specimen: BLOOD  Result Value Ref Range Status   Specimen Description BLOOD BLOOD RIGHT FOREARM  Final   Special Requests   Final    BOTTLES DRAWN AEROBIC AND ANAEROBIC Blood Culture  results may not be optimal due to an inadequate volume of blood received in culture bottles   Culture   Final    NO GROWTH 3 DAYS Performed at [facility], [address] [place], [place] [address]    Report Status PENDING  Incomplete  Culture, blood (Routine X 2) w Reflex to ID Panel     Status: Abnormal   Collection Time: 06/21/21  2:58 PM   Specimen: BLOOD  Result Value Ref Range Status   Specimen Description   Final    BLOOD LEFT ANTECUBITAL Performed at [facility], [address], [place], [place] [address]    Special Requests   Final    BOTTLES DRAWN AEROBIC AND ANAEROBIC Blood Culture results  may not be optimal due to an inadequate volume of blood received in culture bottles Performed at [facility], [address] [place], [place] [address]    Culture  Setup Time   Final    GRAM POSITIVE COCCI IN BOTH AEROBIC AND ANAEROBIC BOTTLES CRITICAL RESULT CALLED TO, READ BACK BY AND VERIFIED WITH: Otto Herb PHARMD [id number] 06/22/21 HNM    Culture (A)  Final    STAPHYLOCOCCUS HOMINIS STAPHYLOCOCCUS HAEMOLYTICUS THE SIGNIFICANCE OF ISOLATING THIS ORGANISM FROM A SINGLE SET OF BLOOD CULTURES WHEN MULTIPLE SETS ARE DRAWN IS UNCERTAIN. PLEASE NOTIFY THE MICROBIOLOGY DEPARTMENT WITHIN ONE WEEK IF SPECIATION AND SENSITIVITIES ARE REQUIRED. Performed at [facility], [place] [address] [place], [place] [address]    Report Status 06/24/2021 FINAL  Final  Blood Culture ID Panel (Reflexed)     Status: Abnormal   Collection Time: 06/21/21  2:58 PM  Result Value Ref Range Status   Enterococcus faecalis NOT DETECTED NOT DETECTED Final   Enterococcus Faecium NOT DETECTED NOT DETECTED Final   Listeria monocytogenes NOT DETECTED NOT DETECTED Final   Staphylococcus species DETECTED (A) NOT DETECTED Final    Comment: CRITICAL RESULT CALLED TO, READ BACK BY AND VERIFIED WITH: Otto Herb PHARMD [id number] 06/22/21 HNM    Staphylococcus aureus (BCID) NOT  DETECTED NOT DETECTED Final   Staphylococcus epidermidis NOT DETECTED NOT DETECTED Final   Staphylococcus lugdunensis NOT DETECTED NOT DETECTED Final   Streptococcus species NOT DETECTED NOT DETECTED Final   Streptococcus agalactiae NOT DETECTED NOT DETECTED Final   Streptococcus pneumoniae NOT DETECTED NOT DETECTED Final   Streptococcus pyogenes NOT DETECTED NOT DETECTED Final   A.calcoaceticus-baumannii NOT DETECTED NOT DETECTED Final   Bacteroides fragilis NOT DETECTED NOT DETECTED Final   Enterobacterales NOT DETECTED NOT DETECTED Final   Enterobacter cloacae complex NOT DETECTED NOT DETECTED Final   Escherichia coli NOT DETECTED NOT DETECTED Final   Klebsiella aerogenes NOT DETECTED NOT DETECTED Final   Klebsiella oxytoca NOT DETECTED NOT DETECTED Final   Klebsiella pneumoniae NOT DETECTED NOT DETECTED Final   Proteus species NOT DETECTED NOT DETECTED Final   Salmonella species NOT DETECTED NOT DETECTED Final   Serratia marcescens NOT DETECTED NOT DETECTED Final   Haemophilus influenzae NOT DETECTED NOT DETECTED Final   Neisseria meningitidis NOT DETECTED NOT DETECTED Final   Pseudomonas aeruginosa NOT DETECTED NOT DETECTED Final   Stenotrophomonas maltophilia NOT DETECTED NOT DETECTED Final   Candida albicans NOT DETECTED NOT DETECTED Final   Candida auris NOT DETECTED NOT DETECTED Final   Candida glabrata NOT DETECTED NOT DETECTED Final   Candida krusei NOT DETECTED NOT DETECTED Final   Candida parapsilosis NOT DETECTED NOT DETECTED Final   Candida tropicalis NOT DETECTED NOT DETECTED Final   Cryptococcus neoformans/gattii NOT DETECTED NOT DETECTED Final    Comment: Performed at [facility], [address] [place], [place] [address]  MRSA Next Gen by PCR, Nasal     Status: None   Collection Time: 06/21/21  6:25 PM   Specimen: Nasal Mucosa; Nasal Swab  Result Value Ref Range Status   MRSA by PCR Next Gen NOT DETECTED NOT DETECTED Final    Comment: (NOTE) The  GeneXpert MRSA Assay (FDA approved for NASAL specimens only), is one component of a comprehensive MRSA colonization surveillance program. It is not intended to diagnose MRSA infection nor to guide or monitor treatment for MRSA infections. Test performance is not FDA approved in patients less than 53 years old. Performed at  [address] [place], [place] [address]     Coagulation Studies: No results for input(s): LABPROT, INR in the last 72 hours.  Urinalysis: Recent Labs    06/21/21 1619  COLORURINE YELLOW*  LABSPEC 1.026  PHURINE 5.0  GLUCOSEU >=500*  HGBUR MODERATE*  BILIRUBINUR NEGATIVE  KETONESUR NEGATIVE  PROTEINUR 30*  NITRITE NEGATIVE  LEUKOCYTESUR NEGATIVE       Imaging: No results found.   Medications:    ampicillin-sulbactam (UNASYN) IV 3 g (06/24/21 1054)   feeding supplement (JEVITY 1.5 CAL/FIBER)     sodium chloride 0.225 % (1/4 NS) NICU IV infusion 75 mL/hr at 06/24/21 0856    acetylcysteine  1 mL Nebulization Once   atorvastatin  20 mg Per Tube Daily   Chlorhexidine Gluconate Cloth  6 each Topical Q0600   enoxaparin (LOVENOX) injection  40 mg Subcutaneous QHS   famotidine  20 mg Per Tube Daily   free water  30 mL Per Tube Q4H   insulin aspart  0-15 Units Subcutaneous Q4H   insulin aspart  0-5 Units Subcutaneous QHS   insulin glargine-yfgn  22 Units Subcutaneous Daily   acetaminophen (TYLENOL) oral liquid 160 mg/5 mL, albuterol, guaiFENesin, ondansetron (ZOFRAN) IV  Assessment/ Plan:  71 y.o. male with a PMHx of oropharyngeal cancer status post G-tube placement, diabetes mellitus type 2, hyperlipidemia, GERD, former tobacco abuse, who was admitted to [facility] on 06/21/2021 for evaluation of cough as well as shortness of breath.     1.  Severe hypernatremia.  Free water deficit improved.  Sodium slowly improving to 148. Continue IVF at this time. Closely monitoring serum sodium.  2.  Acute kidney injury.  Creatinine has  returned to baseline. Bun slowly improving.  3.  Acute respiratory failure secondary to pneumonia. Weaned to 2L    LOS: 3   12/21/20224:10 PM

## 2021-06-24 NOTE — Progress Notes (Signed)
Palliative Care Progress Note, Assessment & Plan   Patient Name: Louis Stone       Date: 06/24/2021 DOB: Oct 14, 1950  Age: 70 y.o. MRN#: [id number] Attending Physician: Nita Sells, MD Primary Care Physician: Theotis Burrow, MD Admit Date: 06/21/2021  Reason for Consultation/Follow-up: Establishing goals of care  Subjective: Patient is sitting in bed and sleeping.  He does not appear to be in distress.  He awakens and acknowledges my presence.  No family at bedside.  HPI: 70 y.o. male  with past medical history of oropharynx cancer status post G-tube placement, diabetes type 2, GERD, HLD and former smoker admitted on 06/21/2021 with complaints of a cough and shortness of breath.  X-ray revealed right lower lobe infiltrate concerning for aspiration pneumonia.  Patient had hypoxic event during hospitalization but has now been weaned to 3 L nasal cannula.   Palliative medicine was consulted to discuss goals of care.  Summary of counseling/coordination of care: After reviewing the patient's chart including epic notes, labs, and imaging, assessed the patient at bedside.  He continues to deny pain.  I asked if he understood what this plan moving forward was.  He was able to convey he wants to go home.  He made a gesture of walking with his fingers and said he is ready.  Asked if he thought he would be able to continue to take care of his G-tube independently.  He shared by showing me he is able to move his gown, pull at his G-tube, and mind what it looks like to give himself tube feeds.  He again said "ready".  I referred to our conversation yesterday and asked if he had thought any more about his advanced care planning.  He shared note.  He said he just wants to go home.  He also asked  if he could have some milk.  I again asked if he would want to have all aggressive medical measures or if he would consider making any changes.  He said no.  He continues to reiterate he just wants to go home.  Outpatient palliative and hospice services described in detail.  Patient acknowledged he would like palliative services to follow him at discharge.  Patient declined hospice at this time.  Questions and concerns were addressed.  Palliative medicine team will continue to follow patient throughout his hospitalization.  Code Status: Full code  Prognosis: Unable to determine  Discharge Planning: Home with Palliative Services  Recommendations/Plan: Outpatient palliative referral at discharge Full code/full scope  Care plan was discussed with patient  Physical Exam Constitutional:      Appearance: Normal appearance.  HENT:     Head: Normocephalic and atraumatic.     Mouth/Throat:     Mouth: Mucous membranes are moist.  Cardiovascular:     Rate and Rhythm: Normal rate.     Pulses: Normal pulses.  Pulmonary:     Effort: Pulmonary effort is normal.  Abdominal:     Palpations: Abdomen is soft.  Musculoskeletal:     Comments: Generalized weakness  Skin:    General: Skin is warm and dry.     Comments: Gtube  Neurological:     Mental Status: He is alert.  Mental status is at baseline.  Psychiatric:        Mood and Affect: Mood normal.        Behavior: Behavior normal.        Thought Content: Thought content normal.        Judgment: Judgment normal.               Total Time 15 minutes  Greater than 50%  of this time was spent counseling and coordinating care related to the above assessment and plan.  Thank you for allowing the Palliative Medicine Team to assist in the care of this patient.  [place] Ilsa Iha, FNP-BC Palliative Medicine Team Team Phone # [phone]

## 2021-06-24 NOTE — Progress Notes (Addendum)
Nutrition Follow-up  DOCUMENTATION CODES:   Severe malnutrition in context of chronic illness  INTERVENTION:   Initiate Jevity 1.5 @ 60 ml/hr via PEG  30 ml free water flush every 4 hours for tube patency  Tube feeding regimen provides 2160 kcal (100% of needs), 92 grams of protein, and 1094 ml of H2O.    NUTRITION DIAGNOSIS:   Severe Malnutrition related to cancer and cancer related treatments as evidenced by severe muscle depletion, severe fat depletion, percent weight loss.  Ongoing  GOAL:   Patient will meet greater than or equal to 90% of their needs  Met with TF  MONITOR:   Labs, Weight trends, TF tolerance, Skin, I & O's  REASON FOR ASSESSMENT:   Consult Enteral/tube feeding initiation and management  ASSESSMENT:   70 y.o. 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   Palliative care consult pending to discuss goals of care.   Medications reviewed and include sodium chloride @ 75 ml/hr.   Labs reviewed: Na: 153, K, Mg, and Phos WDL. CBGS: 159-248 (inpatient orders for glycemic control are 0-15 units insulin aspart every 4 hours, 0-5 units insulin aspart daily at bedtime, and 22 units insulin glargine-yfgn daily).   Diet Order:   Diet Order             Diet NPO time specified  Diet effective now                   EDUCATION NEEDS:   Education needs have been addressed  Skin:  Skin Assessment: Reviewed RN Assessment (ecchymosis)  Last BM:  06/21/21  Height:   Ht Readings from Last 1 Encounters:  06/22/21 5' 7"  (1.702 m)    Weight:   Wt Readings from Last 1 Encounters:   06/22/21 48.2 kg    Ideal Body Weight:  61.8 kg  BMI:  Body mass index is 16.64 kg/m.  Estimated Nutritional Needs:   Kcal:  1800-2100kcal/day  Protein:  90-105  Fluid:  1.5-1.7L/day    Loistine Chance, RD, LDN, [place] Registered Dietitian II Certified Diabetes Care and Education Specialist Please refer to [facility] for RD and/or RD on-call/weekend/after hours pager

## 2021-06-24 NOTE — Progress Notes (Signed)
SLP Cancellation Note  Patient Details Name: Louis Stone MRN: [id number] DOB: 08-14-1950   Cancelled treatment:       Reason Eval/Treat Not Completed: Medical issues which prohibited therapy  Pt with confirmed silent aspiration during recent Modified Barium Swallow Study on 03/13/2021. Pt is inappropriate for PO intake. Continue strict NPO with nutritional support provided via PEG.   [name] B. [place]utherford Nail M.S., CCC-SLP, [place] [facility] [phone]    [name] 06/24/2021, 4:40 PM

## 2021-06-25 ENCOUNTER — Inpatient Hospital Stay: Payer: Medicare Other

## 2021-06-25 DIAGNOSIS — J690 Pneumonitis due to inhalation of food and vomit: Secondary | ICD-10-CM | POA: Diagnosis not present

## 2021-06-25 LAB — CBC WITH DIFFERENTIAL/PLATELET
Abs Immature Granulocytes: 0.08 10*3/uL — ABNORMAL HIGH (ref 0.00–0.07)
Basophils Absolute: 0.1 10*3/uL (ref 0.0–0.1)
Basophils Relative: 0 %
Eosinophils Absolute: 0 10*3/uL (ref 0.0–0.5)
Eosinophils Relative: 0 %
HCT: 41.6 % (ref 39.0–52.0)
Hemoglobin: 13 g/dL (ref 13.0–17.0)
Immature Granulocytes: 1 %
Lymphocytes Relative: 3 %
Lymphs Abs: 0.4 10*3/uL — ABNORMAL LOW (ref 0.7–4.0)
MCH: 25.6 pg — ABNORMAL LOW (ref 26.0–34.0)
MCHC: 31.3 g/dL (ref 30.0–36.0)
MCV: 82.1 fL (ref 80.0–100.0)
Monocytes Absolute: 0.6 10*3/uL (ref 0.1–1.0)
Monocytes Relative: 4 %
Neutro Abs: 13.5 10*3/uL — ABNORMAL HIGH (ref 1.7–7.7)
Neutrophils Relative %: 92 %
Platelets: 168 10*3/uL (ref 150–400)
RBC: 5.07 MIL/uL (ref 4.22–5.81)
RDW: 14.6 % (ref 11.5–15.5)
WBC: 14.7 10*3/uL — ABNORMAL HIGH (ref 4.0–10.5)
nRBC: 0 % (ref 0.0–0.2)

## 2021-06-25 LAB — GLUCOSE, CAPILLARY
Glucose-Capillary: 125 mg/dL — ABNORMAL HIGH (ref 70–99)
Glucose-Capillary: 152 mg/dL — ABNORMAL HIGH (ref 70–99)
Glucose-Capillary: 186 mg/dL — ABNORMAL HIGH (ref 70–99)
Glucose-Capillary: 253 mg/dL — ABNORMAL HIGH (ref 70–99)
Glucose-Capillary: 275 mg/dL — ABNORMAL HIGH (ref 70–99)
Glucose-Capillary: 297 mg/dL — ABNORMAL HIGH (ref 70–99)
Glucose-Capillary: 312 mg/dL — ABNORMAL HIGH (ref 70–99)
Glucose-Capillary: 314 mg/dL — ABNORMAL HIGH (ref 70–99)

## 2021-06-25 LAB — BLOOD GAS, ARTERIAL
Acid-Base Excess: 9 mmol/L — ABNORMAL HIGH (ref 0.0–2.0)
Acid-Base Excess: 10.8 mmol/L — ABNORMAL HIGH (ref 0.0–2.0)
Bicarbonate: 33.5 mmol/L — ABNORMAL HIGH (ref 20.0–28.0)
Bicarbonate: 35.1 mmol/L — ABNORMAL HIGH (ref 20.0–28.0)
Delivery systems: POSITIVE
Expiratory PAP: 6
FIO2: 0.5
FIO2: 0.75
Inspiratory PAP: 12
O2 Saturation: 86.9 %
O2 Saturation: 93.6 %
Patient temperature: 37
Patient temperature: 37
pCO2 arterial: 44 mmHg (ref 32.0–48.0)
pCO2 arterial: 44 mmHg (ref 32.0–48.0)
pH, Arterial: 7.49 — ABNORMAL HIGH (ref 7.350–7.450)
pH, Arterial: 7.51 — ABNORMAL HIGH (ref 7.350–7.450)
pO2, Arterial: 48 mmHg — ABNORMAL LOW (ref 83.0–108.0)
pO2, Arterial: 62 mmHg — ABNORMAL LOW (ref 83.0–108.0)

## 2021-06-25 LAB — URINALYSIS, COMPLETE (UACMP) WITH MICROSCOPIC
Bilirubin Urine: NEGATIVE
Glucose, UA: NEGATIVE mg/dL
Ketones, ur: NEGATIVE mg/dL
Leukocytes,Ua: NEGATIVE
Nitrite: NEGATIVE
Protein, ur: NEGATIVE mg/dL
Specific Gravity, Urine: 1.015 (ref 1.005–1.030)
pH: 5 (ref 5.0–8.0)

## 2021-06-25 LAB — BASIC METABOLIC PANEL
Anion gap: 5 (ref 5–15)
Anion gap: 10 (ref 5–15)
BUN: 30 mg/dL — ABNORMAL HIGH (ref 8–23)
BUN: 23 mg/dL (ref 8–23)
CO2: 30 mmol/L (ref 22–32)
CO2: 32 mmol/L (ref 22–32)
Calcium: 8.5 mg/dL — ABNORMAL LOW (ref 8.9–10.3)
Calcium: 9 mg/dL (ref 8.9–10.3)
Chloride: 111 mmol/L (ref 98–111)
Chloride: 104 mmol/L (ref 98–111)
Creatinine, Ser: 1.03 mg/dL (ref 0.61–1.24)
Creatinine, Ser: 0.95 mg/dL (ref 0.61–1.24)
GFR, Estimated: >60 mL/min (ref >60)
GFR, Estimated: >60 mL/min (ref >60)
Glucose, Bld: 283 mg/dL — ABNORMAL HIGH (ref 70–99)
Glucose, Bld: 143 mg/dL — ABNORMAL HIGH (ref 70–99)
Potassium: 3.7 mmol/L (ref 3.5–5.1)
Potassium: 3.5 mmol/L (ref 3.5–5.1)
Sodium: 146 mmol/L — ABNORMAL HIGH (ref 135–145)
Sodium: 146 mmol/L — ABNORMAL HIGH (ref 135–145)

## 2021-06-25 LAB — BRAIN NATRIURETIC PEPTIDE: B Natriuretic Peptide: 174.2 pg/mL — ABNORMAL HIGH (ref 0.0–100.0)

## 2021-06-25 LAB — MAGNESIUM: Magnesium: 2.4 mg/dL (ref 1.7–2.4)

## 2021-06-25 LAB — PROCALCITONIN: Procalcitonin: 3.21 ng/mL

## 2021-06-25 LAB — LACTIC ACID, PLASMA: Lactic Acid, Venous: 2.7 mmol/L (ref 0.5–1.9)

## 2021-06-25 MED ORDER — VANCOMYCIN HCL 1250 MG/250ML IV SOLN
1250.0000 mg | Freq: Once | INTRAVENOUS | Status: AC
  Administered 2021-06-25: 1250 mg via INTRAVENOUS
  Filled 2021-06-25: qty 250

## 2021-06-25 MED ORDER — METHYLPREDNISOLONE SODIUM SUCC 40 MG IJ SOLR
20.0000 mg | Freq: Two times a day (BID) | INTRAMUSCULAR | Status: DC
  Administered 2021-06-25 – 2021-06-27 (×4): 20 mg via INTRAVENOUS
  Filled 2021-06-25 (×4): qty 1

## 2021-06-25 MED ORDER — INSULIN GLARGINE-YFGN 100 UNIT/ML ~~LOC~~ SOLN
25.0000 [IU] | Freq: Every day | SUBCUTANEOUS | Status: DC
  Administered 2021-06-26: 12:00:00 25 [IU] via SUBCUTANEOUS
  Filled 2021-06-25 (×2): qty 0.25

## 2021-06-25 MED ORDER — METHYLPREDNISOLONE SODIUM SUCC 40 MG IJ SOLR: 40.0000 mg | Freq: Two times a day (BID) | INTRAMUSCULAR | Status: DC

## 2021-06-25 MED ORDER — VANCOMYCIN HCL IN DEXTROSE 1-5 GM/200ML-% IV SOLN
1000.0000 mg | INTRAVENOUS | Status: DC
  Administered 2021-06-26: 23:00:00 1000 mg via INTRAVENOUS
  Filled 2021-06-25 (×2): qty 200

## 2021-06-25 MED ORDER — INSULIN ASPART 100 UNIT/ML IJ SOLN
5.0000 [IU] | INTRAMUSCULAR | Status: DC
  Administered 2021-06-25 – 2021-06-26 (×4): 5 [IU] via SUBCUTANEOUS
  Filled 2021-06-25 (×3): qty 1

## 2021-06-25 MED ORDER — BUDESONIDE 0.25 MG/2ML IN SUSP
0.2500 mg | Freq: Two times a day (BID) | RESPIRATORY_TRACT | Status: DC
  Administered 2021-06-25 – 2021-07-05 (×20): 0.25 mg via RESPIRATORY_TRACT
  Filled 2021-06-25 (×20): qty 2

## 2021-06-25 MED ORDER — CEFEPIME HCL 2 G IJ SOLR
2.0000 g | Freq: Two times a day (BID) | INTRAMUSCULAR | Status: DC
  Administered 2021-06-25 – 2021-06-27 (×4): 2 g via INTRAVENOUS
  Filled 2021-06-25 (×7): qty 2

## 2021-06-25 MED ORDER — METOPROLOL TARTRATE 5 MG/5ML IV SOLN
5.0000 mg | Freq: Four times a day (QID) | INTRAVENOUS | Status: DC | PRN
  Administered 2021-06-25: 19:00:00 5 mg via INTRAVENOUS
  Filled 2021-06-25: qty 5

## 2021-06-25 MED ORDER — FREE WATER
200.0000 mL | Freq: Four times a day (QID) | Status: DC
Start: 2021-06-25 — End: 2021-06-25

## 2021-06-25 MED ORDER — FUROSEMIDE 10 MG/ML IJ SOLN
40.0000 mg | Freq: Once | INTRAMUSCULAR | Status: AC
  Administered 2021-06-26: 06:00:00 40 mg via INTRAVENOUS
  Filled 2021-06-25: qty 4

## 2021-06-25 MED ORDER — SODIUM CHLORIDE 0.9 % IV SOLN
3.0000 g | Freq: Four times a day (QID) | INTRAVENOUS | Status: DC
  Administered 2021-06-25: 15:00:00 3 g via INTRAVENOUS
  Filled 2021-06-25 (×4): qty 8

## 2021-06-25 MED ORDER — FUROSEMIDE 10 MG/ML IJ SOLN
40.0000 mg | Freq: Once | INTRAMUSCULAR | Status: AC
  Administered 2021-06-25: 15:00:00 40 mg via INTRAVENOUS
  Filled 2021-06-25: qty 4

## 2021-06-25 NOTE — Progress Notes (Signed)
Patient was suctioned twice orally due to excessive secretions. Patient tolerated procedure well and is currently stable.

## 2021-06-25 NOTE — Evaluation (Addendum)
Physical Therapy Evaluation Patient Details Name: Louis Stone MRN: [id number] DOB: 08/02/1950 Today's Date: 06/25/2021  History of Present Illness  Louis Stone is a 70 y.o. male with medical history significant of oropharynx cancer, s/p of G-tube placement, diabetes mellitus, hyperlipidemia, GERD, former smoker, aspiration pneumonia, who presents with shortness breath and cough.  Clinical Impression  Patient received in bed, he is alert and agreeable to PT assessment. Patient requires cues for mobility initiation. He is min guard/assist for supine to sit. Poor sitting balance. Requires hands on assist for maintaining balance. He performed sit to stand with mod assist and is able to take a few steps at edge of bed with mod assist. Patient will continue to benefit from skilled PT while here to improve mobility and strength. Patient is very weak, at increased fall risk. He will not be able to return home safely, needs higher level of care.        Recommendations for follow up therapy are one component of a multi-disciplinary discharge planning process, led by the attending physician.  Recommendations may be updated based on patient status, additional functional criteria and insurance authorization.  Follow Up Recommendations Skilled nursing-short term rehab (<3 hours/day)    Assistance Recommended at Discharge Frequent or constant Supervision/Assistance  Functional Status Assessment Patient has had a recent decline in their functional status and demonstrates the ability to make significant improvements in function in a reasonable and predictable amount of time.  Equipment Recommendations  Other (comment) (TBD)    Recommendations for Other Services       Precautions / Restrictions Precautions Precautions: Fall Restrictions Weight Bearing Restrictions: No      Mobility  Bed Mobility Overal bed mobility: Modified Independent                  Transfers Overall  transfer level: Needs assistance Equipment used: 1 person hand held assist Transfers: Sit to/from Stand Sit to Stand: Mod assist                Ambulation/Gait Ambulation/Gait assistance: Mod assist Gait Distance (Feet): 4 Feet Assistive device: 1 person hand held assist Gait Pattern/deviations: Step-to pattern;Decreased step length - right;Decreased step length - left;Trunk flexed;Narrow base of support Gait velocity: decr     General Gait Details: patient ambulated a few steps along edge of bed for repositioning in bed. Required mod assist due to poor balance and weakness. HR 101-116 during mobility O2 at 91-92%.   Stairs            Wheelchair Mobility    Modified Rankin (Stroke Patients Only)       Balance Overall balance assessment: Needs assistance Sitting-balance support: Feet supported Sitting balance-Leahy Scale: Poor Sitting balance - Comments: difficulty maintaning independent sitting balance. Required hands on assist to prevent lob Postural control: Posterior lean;Right lateral lean;Left lateral lean Standing balance support: During functional activity;Bilateral upper extremity supported Standing balance-Leahy Scale: Poor Standing balance comment: requires mod assist for balance in standing                             Pertinent Vitals/Pain Pain Assessment: No/denies pain    Home Living Family/patient expects to be discharged to:: Private residence Living Arrangements: Children Available Help at Discharge: Family;Available PRN/intermittently Type of Home: House Home Access: Ramped entrance       Home Layout: One level Home Equipment: [place] (2 wheels) Additional Comments: step daughter there to assist as needed.  She works, so is not there 24 hrs.    Prior Function Prior Level of Function : Needs assist       Physical Assist : Mobility (physical);ADLs (physical) Mobility (physical): Bed mobility;Transfers;Gait    Mobility Comments: patient requires mod assist for sit to stand and to take a few side steps along edge of bed. ADLs Comments: needs assist     Hand Dominance   Dominant Hand: Right    Extremity/Trunk Assessment   Upper Extremity Assessment Upper Extremity Assessment: Generalized weakness    Lower Extremity Assessment Lower Extremity Assessment: Generalized weakness    Cervical / Trunk Assessment Cervical / Trunk Assessment: Normal  Communication   Communication: Expressive difficulties  Cognition Arousal/Alertness: Awake/alert Behavior During Therapy: WFL for tasks assessed/performed Overall Cognitive Status: Within Functional Limits for tasks assessed                                          General Comments      Exercises     Assessment/Plan    PT Assessment Patient needs continued PT services  PT Problem List Decreased strength;Decreased mobility;Decreased activity tolerance;Decreased balance;Decreased safety awareness       PT Treatment Interventions Therapeutic activities;Gait training;Therapeutic exercise;Functional mobility training;Balance training;Patient/family education    PT Goals (Current goals can be found in the Care Plan section)  Acute Rehab PT Goals Patient Stated Goal: to go home PT Goal Formulation: With patient Time For Goal Achievement: 07/09/21 Potential to Achieve Goals: Fair    Frequency Min 2X/week   Barriers to discharge Decreased caregiver support      Co-evaluation               AM-PAC PT "6 Clicks" Mobility  Outcome Measure Help needed turning from your back to your side while in a flat bed without using bedrails?: A Lot Help needed moving from lying on your back to sitting on the side of a flat bed without using bedrails?: A Lot Help needed moving to and from a bed to a chair (including a wheelchair)?: A Lot Help needed standing up from a chair using your arms (e.g., wheelchair or bedside chair)?:  A Lot Help needed to walk in hospital room?: Total Help needed climbing 3-5 steps with a railing? : Total 6 Click Score: 10    End of Session Equipment Utilized During Treatment: Oxygen Activity Tolerance: Patient limited by fatigue Patient left: in bed;with call bell/phone within reach;with bed alarm set Nurse Communication: Mobility status PT Visit Diagnosis: Unsteadiness on feet (R26.81);Muscle weakness (generalized) (M62.81);Difficulty in walking, not elsewhere classified (R26.2);Other abnormalities of gait and mobility (R26.89)    Time: 1610-9604 PT Time Calculation (min) (ACUTE ONLY): 14 min   Charges:   PT Evaluation $PT Eval Moderate Complexity: 1 Mod          Isaak Delmundo, PT, GCS 06/25/21,3:33 PM

## 2021-06-25 NOTE — Progress Notes (Addendum)
Patient arrived to the unit from 1A. Oxygen saturations 65%. I traced nasal cannula tubing and noticed there was two [place] tubings in the bed, the one attached to the patient's face was not connected to wall oxygen.   15L HFNC and non-rebreather placed on patient with oxygen saturations remaining in 70's. I suctioned 100 cc of thick yellow substance from the back of the patients throat.  Rapid response and respiratory called. Patient placed on Bipap. Oxygen saturations 99% on 12/6, FIO2 50%.   Patient appears comfortable - planning to transfer to ICU per MD order.

## 2021-06-25 NOTE — Consult Note (Signed)
Pharmacy Antibiotic Note  Louis Stone is a 70 y.o. male admitted on 06/21/2021 with  aspiration pneumonia .  Was initially treated with Unasyn d/t SOB and acute respiratory failure. He was changed to PO Augmentin on 12/21 but has clinically worsened. Pharmacy has been consulted to re-initiate Unasyn.  Plan: Unasyn 3g IV Q6 hours  Height: 5\' 7"  (170.2 cm) Weight: 48.2 kg (106 lb 4.2 oz) IBW/kg (Calculated) : 66.1  Temp (24hrs), Avg:99.1 F (37.3 C), Min:97.5 F (36.4 C), Max:101.2 F (38.4 C)  Recent Labs  Lab 06/21/21 1244 06/21/21 1458 06/21/21 1824 06/22/21 0151 06/22/21 0545 06/22/21 1646 06/23/21 0521 06/24/21 0435 06/25/21 0328  WBC 11.4*  --   --   --  9.8  --  11.4*  --   --   CREATININE 1.52*  --   --    < > 1.15 1.33* 1.11 0.99 1.03  LATICACIDVEN  --  2.2* 1.8  --   --   --   --   --   --    < > = values in this interval not displayed.    Estimated Creatinine Clearance: 46.1 mL/min (by C-G formula based on SCr of 1.03 mg/dL).    No Known Allergies  Antimicrobials this admission: Unasyn 12/18 >>  Augmentin 12/21 >> 12/22  Microbiology results: 12/18 BCx: Staph Hominis, Staph Haemolyticus 12/18 MRSA PCR: negative  Thank you for allowing pharmacy to be a part of this patients care.  Dorrell Mitcheltree A Cristiana Yochim 06/25/2021 2:16 PM

## 2021-06-25 NOTE — Progress Notes (Signed)
Inpatient Diabetes Program Recommendations  AACE/ADA: New Consensus Statement on Inpatient Glycemic Control   Target Ranges:  Prepandial:   less than 140 mg/dL      Peak postprandial:   less than 180 mg/dL (1-2 hours)      Critically ill patients:  140 - 180 mg/dL    Latest Reference Range & Units 06/25/21 00:06 06/25/21 03:52 06/25/21 04:13  Glucose-Capillary 70 - 99 mg/dL 253 (H) 314 (H) 297 (H)    Latest Reference Range & Units 06/24/21 08:06 06/24/21 11:43 06/24/21 16:27 06/24/21 19:52 06/24/21 20:26  Glucose-Capillary 70 - 99 mg/dL 159 (H) 228 (H) 316 (H) 239 (H) 228 (H)   Review of Glycemic Control  Current orders for Inpatient glycemic control: Semglee 22 units daily, Novolog 0-15 units Q4H; Jevity @ 60 ml/hr  Inpatient Diabetes Program Recommendations:    Insulin-Tube Feeding: Please consider ordering Novolog 5 units Q4H for tube feeding coverage. If tube feeding is stopped or held then Novolog tube feeding coverage should also be stopped or held.  Thanks, Barnie Alderman, RN, MSN, CDE Diabetes Coordinator Inpatient Diabetes Program [phone] (Team Pager from 8am to 5pm)

## 2021-06-25 NOTE — Progress Notes (Signed)
[facility]  ROUNDING NOTE   Subjective:   Patient resting quietly Alert and able to answer questions Denies shortness of breath Denies nausea and vomiting Has hiccups   Sodium 146 0.225% saline at 75 ml/hr Tube Feeds @60  ml/hr Creatinine 1.03   Objective:  Vital signs in last 24 hours:  Temp:  [97.5 F (36.4 C)-100.1 F (37.8 C)] 97.5 F (36.4 C) (12/22 1207) Pulse Rate:  [106-124] 124 (12/22 1207) Resp:  [18-23] 18 (12/22 1207) BP: (102-123)/(58-87) 123/87 (12/22 1207) SpO2:  [88 %-94 %] 88 % (12/22 1207)  Weight change:  Filed Weights   06/21/21 1239 06/22/21 0331  Weight: 54.4 kg 48.2 kg    Intake/Output: I/O last 3 completed shifts: In: -  Out: 850 [Urine:850]   Intake/Output this shift:  No intake/output data recorded.  Physical Exam: General: Cachectic appearing male  Head: Normocephalic, atraumatic. Moist oral mucosal membranes  Eyes: Anicteric  Lungs:  Mild exp wheeze, normal effort  Heart: S1S2 no rubs  Abdomen:  Soft, nontender, bowel sounds present  Extremities: No peripheral edema.  Neurologic: Awake, alert, following commands  Skin: No acute rash  Access: No hemodialysis access    Basic Metabolic Panel: Recent Labs  Lab 06/22/21 0545 06/22/21 1646 06/23/21 0521 06/23/21 1158 06/23/21 1545 06/24/21 0005 06/24/21 0435 06/24/21 1439 06/25/21 0328  NA 162* 161* 154*  --  154* 151* 153* 148* 146*  K 3.8 4.0 3.5  --   --   --  3.6  --  3.7  CL 123* 123* 119*  --   --   --  116*  --  111  CO2 31 30 28   --   --   --  31  --  30  GLUCOSE 131* 270* 390* 461*  --   --  135*  --  283*  BUN 56* 53* 38*  --   --   --  30*  --  30*  CREATININE 1.15 1.33* 1.11  --   --   --  0.99  --  1.03  CALCIUM 9.6 9.4 9.1  --   --   --  9.0  --  8.5*  MG  --   --  2.6*  --   --   --  2.4  --   --   PHOS  --   --  2.4*  --   --   --  3.3  --   --      Liver Function Tests: Recent Labs  Lab 06/21/21 1244  AST 28  ALT 114*  ALKPHOS  101  BILITOT 0.8  PROT 8.8*  ALBUMIN 3.4*    No results for input(s): LIPASE, AMYLASE in the last 168 hours. No results for input(s): AMMONIA in the last 168 hours.  CBC: Recent Labs  Lab 06/21/21 1244 06/22/21 0545 06/23/21 0521  WBC 11.4* 9.8 11.4*  HGB 13.9 14.1 12.1*  HCT 44.8 46.0 39.2  MCV 83.4 83.8 84.8  PLT 243 227 192     Cardiac Enzymes: No results for input(s): CKTOTAL, CKMB, CKMBINDEX, TROPONINI in the last 168 hours.  BNP: Invalid input(s): POCBNP  CBG: Recent Labs  Lab 06/25/21 0006 06/25/21 0352 06/25/21 0413 06/25/21 0847 06/25/21 1213  GLUCAP 253* 314* 297* 312* 275*     Microbiology: Results for orders placed or performed during the hospital encounter of 06/21/21  Resp Panel by RT-PCR (Flu A&B, Covid) Nasopharyngeal Swab     Status: None   Collection Time:  06/21/21  2:58 PM   Specimen: Nasopharyngeal Swab; Nasopharyngeal(NP) swabs in vial transport medium  Result Value Ref Range Status   SARS Coronavirus 2 by RT PCR NEGATIVE NEGATIVE Final    Comment: (NOTE) SARS-CoV-2 target nucleic acids are NOT DETECTED.  The SARS-CoV-2 RNA is generally detectable in upper respiratory specimens during the acute phase of infection. The lowest concentration of SARS-CoV-2 viral copies this assay can detect is 138 copies/mL. A negative result does not preclude SARS-Cov-2 infection and should not be used as the sole basis for treatment or other patient management decisions. A negative result may occur with  improper specimen collection/handling, submission of specimen other than nasopharyngeal swab, presence of viral mutation(s) within the areas targeted by this assay, and inadequate number of viral copies(<138 copies/mL). A negative result must be combined with clinical observations, patient history, and epidemiological information. The expected result is Negative.  Fact Sheet for Patients:  EntrepreneurPulse.com.au  Fact Sheet for  Healthcare Providers:  IncredibleEmployment.be  This test is no t yet approved or cleared by the [place] FDA and  has been authorized for detection and/or diagnosis of SARS-CoV-2 by FDA under an Emergency Use Authorization (EUA). This EUA will remain  in effect (meaning this test can be used) for the duration of the COVID-19 declaration under Section 564(b)(1) of the Act, 21 U.S.C.section 360bbb-3(b)(1), unless the authorization is terminated  or revoked sooner.       Influenza A by PCR NEGATIVE NEGATIVE Final   Influenza B by PCR NEGATIVE NEGATIVE Final    Comment: (NOTE) The Xpert Xpress SARS-CoV-2/FLU/RSV plus assay is intended as an aid in the diagnosis of influenza from Nasopharyngeal swab specimens and should not be used as a sole basis for treatment. Nasal washings and aspirates are unacceptable for Xpert Xpress SARS-CoV-2/FLU/RSV testing.  Fact Sheet for Patients: EntrepreneurPulse.com.au  Fact Sheet for Healthcare Providers: IncredibleEmployment.be  This test is not yet approved or cleared by the [place] FDA and has been authorized for detection and/or diagnosis of SARS-CoV-2 by FDA under an Emergency Use Authorization (EUA). This EUA will remain in effect (meaning this test can be used) for the duration of the COVID-19 declaration under Section 564(b)(1) of the Act, 21 U.S.C. section 360bbb-3(b)(1), unless the authorization is terminated or revoked.  Performed at [facility], [address] [place], [place] [address]   Culture, blood (Routine X 2) w Reflex to ID Panel     Status: None (Preliminary result)   Collection Time: 06/21/21  2:58 PM   Specimen: BLOOD  Result Value Ref Range Status   Specimen Description BLOOD BLOOD RIGHT FOREARM  Final   Special Requests   Final    BOTTLES DRAWN AEROBIC AND ANAEROBIC Blood Culture results may not be optimal due to an inadequate volume of blood  received in culture bottles   Culture   Final    NO GROWTH 4 DAYS Performed at [facility], [address], [place], [place] [address]    Report Status PENDING  Incomplete  Culture, blood (Routine X 2) w Reflex to ID Panel     Status: Abnormal   Collection Time: 06/21/21  2:58 PM   Specimen: BLOOD  Result Value Ref Range Status   Specimen Description   Final    BLOOD LEFT ANTECUBITAL Performed at [facility], [address], [place], [place] [address]    Special Requests   Final    BOTTLES DRAWN AEROBIC AND ANAEROBIC Blood Culture results may not  be optimal due to an inadequate volume of blood received in culture bottles Performed at [facility], [address] [place], [place] [address]    Culture  Setup Time   Final    GRAM POSITIVE COCCI IN BOTH AEROBIC AND ANAEROBIC BOTTLES CRITICAL RESULT CALLED TO, READ BACK BY AND VERIFIED WITH: Otto Herb PHARMD 0300 06/22/21 HNM    Culture (A)  Final    STAPHYLOCOCCUS HOMINIS STAPHYLOCOCCUS HAEMOLYTICUS THE SIGNIFICANCE OF ISOLATING THIS ORGANISM FROM A SINGLE SET OF BLOOD CULTURES WHEN MULTIPLE SETS ARE DRAWN IS UNCERTAIN. PLEASE NOTIFY THE MICROBIOLOGY DEPARTMENT WITHIN ONE WEEK IF SPECIATION AND SENSITIVITIES ARE REQUIRED. Performed at [facility], [place] [address], [place], [place] [address]    Report Status 06/24/2021 FINAL  Final  Blood Culture ID Panel (Reflexed)     Status: Abnormal   Collection Time: 06/21/21  2:58 PM  Result Value Ref Range Status   Enterococcus faecalis NOT DETECTED NOT DETECTED Final   Enterococcus Faecium NOT DETECTED NOT DETECTED Final   Listeria monocytogenes NOT DETECTED NOT DETECTED Final   Staphylococcus species DETECTED (A) NOT DETECTED Final    Comment: CRITICAL RESULT CALLED TO, READ BACK BY AND VERIFIED WITH: Otto Herb PHARMD 0762 06/22/21 HNM    Staphylococcus aureus (BCID) NOT DETECTED NOT DETECTED Final   Staphylococcus epidermidis NOT DETECTED  NOT DETECTED Final   Staphylococcus lugdunensis NOT DETECTED NOT DETECTED Final   Streptococcus species NOT DETECTED NOT DETECTED Final   Streptococcus agalactiae NOT DETECTED NOT DETECTED Final   Streptococcus pneumoniae NOT DETECTED NOT DETECTED Final   Streptococcus pyogenes NOT DETECTED NOT DETECTED Final   A.calcoaceticus-baumannii NOT DETECTED NOT DETECTED Final   Bacteroides fragilis NOT DETECTED NOT DETECTED Final   Enterobacterales NOT DETECTED NOT DETECTED Final   Enterobacter cloacae complex NOT DETECTED NOT DETECTED Final   Escherichia coli NOT DETECTED NOT DETECTED Final   Klebsiella aerogenes NOT DETECTED NOT DETECTED Final   Klebsiella oxytoca NOT DETECTED NOT DETECTED Final   Klebsiella pneumoniae NOT DETECTED NOT DETECTED Final   Proteus species NOT DETECTED NOT DETECTED Final   Salmonella species NOT DETECTED NOT DETECTED Final   Serratia marcescens NOT DETECTED NOT DETECTED Final   Haemophilus influenzae NOT DETECTED NOT DETECTED Final   Neisseria meningitidis NOT DETECTED NOT DETECTED Final   Pseudomonas aeruginosa NOT DETECTED NOT DETECTED Final   Stenotrophomonas maltophilia NOT DETECTED NOT DETECTED Final   Candida albicans NOT DETECTED NOT DETECTED Final   Candida auris NOT DETECTED NOT DETECTED Final   Candida glabrata NOT DETECTED NOT DETECTED Final   Candida krusei NOT DETECTED NOT DETECTED Final   Candida parapsilosis NOT DETECTED NOT DETECTED Final   Candida tropicalis NOT DETECTED NOT DETECTED Final   Cryptococcus neoformans/gattii NOT DETECTED NOT DETECTED Final    Comment: Performed at [facility], [address] [place], [place] [address]  MRSA Next Gen by PCR, Nasal     Status: None   Collection Time: 06/21/21  6:25 PM   Specimen: Nasal Mucosa; Nasal Swab  Result Value Ref Range Status   MRSA by PCR Next Gen NOT DETECTED NOT DETECTED Final    Comment: (NOTE) The GeneXpert MRSA Assay (FDA approved for NASAL specimens only), is one  component of a comprehensive MRSA colonization surveillance program. It is not intended to diagnose MRSA infection nor to guide or monitor treatment for MRSA infections. Test performance is not FDA approved in patients less than 73 years old. Performed at [facility], [place], [place] [address]     Coagulation Studies: No results for input(s): LABPROT, INR in the last 72 hours.  Urinalysis: No results for input(s): COLORURINE, LABSPEC, PHURINE, GLUCOSEU, HGBUR, BILIRUBINUR, KETONESUR, PROTEINUR, UROBILINOGEN, NITRITE, LEUKOCYTESUR in the last 72 hours.  Invalid input(s): APPERANCEUR     Imaging: No results found.   Medications:    feeding supplement (JEVITY 1.5 CAL/FIBER) 1,000 mL (06/25/21 0627)   sodium chloride 0.225 % (1/4 NS) NICU IV infusion 75 mL/hr at 06/25/21 0347    acetylcysteine  1 mL Nebulization Once   amoxicillin-clavulanate  1 tablet Per Tube Q12H   atorvastatin  20 mg Per Tube Daily   Chlorhexidine Gluconate Cloth  6 each Topical Q0600   enoxaparin (LOVENOX) injection  40 mg Subcutaneous QHS   famotidine  20 mg Per Tube Daily   free water  200 mL Per Tube Q6H   insulin aspart  0-15 Units Subcutaneous Q4H   insulin aspart  0-5 Units Subcutaneous QHS   insulin aspart  5 Units Subcutaneous Q4H   insulin glargine-yfgn  22 Units Subcutaneous Daily   acetaminophen (TYLENOL) oral liquid 160 mg/5 mL, albuterol, guaiFENesin, ondansetron (ZOFRAN) IV  Assessment/ Plan:  70 y.o. male with a PMHx of oropharyngeal cancer status post G-tube placement, diabetes mellitus type 2, hyperlipidemia, GERD, former tobacco abuse, who was admitted to [facility] on 06/21/2021 for evaluation of cough as well as shortness of breath.     1.  Severe hypernatremia.  Free water deficit improved.  Sodium currently 146. Continue IVF. Monitoring sodium.  2.  Acute kidney injury.  Creatinine returned to baseline  3.  Acute respiratory failure secondary to pneumonia.  Weaned to 2L [place]   LOS: 4   12/22/202212:46 PM

## 2021-06-25 NOTE — Consult Note (Signed)
Pharmacy Antibiotic Note  Louis Stone is a 70 y.o. male admitted on 06/21/2021 with  aspiration pneumonia .  Was initially treated with Unasyn d/t SOB and acute respiratory failure. He was changed to PO Augmentin on 12/21 but clinically worsened and changed to Unasyn 12/22. Patient transferred to the ICU later 12/22 and started on vancomycin and cefepime.  Plan: Vancomycin 1250 mg IV x 1 followed by vancomycin 1000 mg IV q24h Goal AUC 400-550 Expected AUC: 494 SCr used: 0.95  Cefepime 2 g IV q12h   Height: 5\' 5"  (165.1 cm) Weight: 54.6 kg (120 lb 5.9 oz) IBW/kg (Calculated) : 61.5  Temp (24hrs), Avg:98.9 F (37.2 C), Min:97.5 F (36.4 C), Max:101.2 F (38.4 C)  Recent Labs  Lab 06/21/21 1244 06/21/21 1458 06/21/21 1824 06/22/21 0151 06/22/21 0545 06/22/21 1646 06/23/21 0521 06/24/21 0435 06/25/21 0328 06/25/21 1937  WBC 11.4*  --   --   --  9.8  --  11.4*  --   --  14.7*  CREATININE 1.52*  --   --    < > 1.15 1.33* 1.11 0.99 1.03 0.95  LATICACIDVEN  --  2.2* 1.8  --   --   --   --   --   --   --    < > = values in this interval not displayed.     Estimated Creatinine Clearance: 56.7 mL/min (by C-G formula based on SCr of 0.95 mg/dL).    No Known Allergies  Antimicrobials this admission: Cefepime 12/22 >> Vancomycin 12/22 >> Unasyn 12/18 >> 12/22 Augmentin 12/21 >> 12/22  Microbiology results: 12/22 Bcx: pending 12/18 BCx: Staph Hominis, Staph Haemolyticus 12/18 MRSA PCR: negative  Thank you for allowing pharmacy to be a part of this patients care.  Tawnya Crook, PharmD, BCPS Clinical Pharmacist 06/25/2021 8:23 PM

## 2021-06-25 NOTE — Consult Note (Signed)
NAME:  Louis Stone, MRN:  [id number], DOB:  1950-08-29, LOS: 4 ADMISSION DATE:  06/21/2021, INITIAL CONSULTATION DATE:  06/22/2021, RE CONSULT DATE: 06/25/21 REFERRING MD:  Nita Sells, MD, CHIEF COMPLAINT:  Hypoxia   Brief Patient Description  70yo male w/ a past medical history of oropharynx cancer s/p G-tube placement, Type II diabetes, HLD, GERD and former smoker who was admitted to hospitalist service with acute hypoxic respiratory failure secondary to suspected aspiration pneumonia.   ED COURSE: Per the medical record, the patient has been having these symptoms for a few days. Denies fevers. ED workup revealed: elevated glucose of 500, WBC 11.4, Cr 1.58, Na 153, chest xray which revealed right lower lobe infiltrate concerning for aspiration pneumonia and initially hypoxic w/sats of 88% that improved with 2L [place]. The patients received 1L IVFs, unasyn and was started on IVFs at 75cc/hr. Cultures sent. COVID and Flu negative. PCCM consulted on 12/19 due to worsening hypoxia  HOSPITAL COURSE: CTA  was obtained  which was negative for PE but showed pneumoperitoneum.  Surgery consulted, free air felt to be due to PEG tube.  Nephrology consulted for AKI and Hypernatremia 12/20: Weaned to 3L [place].  AKI and Hypernatremia improving. Patient transferred to [facility] unit. On 12/21, patient was requiring less oxygen and was transitioned from Unasyn to Augmentin. On 12/22, patient transferred to PCU on 13L HFNC, however decompensated further with increased oxygen needs requiring transfer to stepdown. PCCM re-consulted due to high risk for decompensation  Pertinent  Medical History  Type II Diabetes Oropharynx cancer s/p Gtube placement Hyperlipidemia Hypertension  Significant Hospital Events: Including procedures, antibiotic start and stop dates in addition to other pertinent events   12/19: Admitted. [facility]low. CTA negative for PE, did show pneumoperitoneum.  Surgery consulted, free air felt  to be due to PEG tube.  Nephrology consulted for AKI and Hypernatremia 12/20: Weaned to 3L [place].  AKI and Hypernatremia improving. Patient transferred to [facility] unit 12/21: Requiring less oxygen on . Transitioned from Unasyn to Augmentin 12/22: Patient transferred to PCU on 13L HFNC, however decompensated further with increased oxygen needs requiring transfer to stepdown. PCCM re-consulted due to high risk for decompensation.  Microbiology Results:  12/18: SARS-CoV-2 PCR>> negative 12/18: Influenza PCR>> negative 06/21/21: Strep pneumo urinary antigen>>negative 06/21/21: Blood culture>> 1/2 with Staph Hominis + Haemolyticus  12/18: MRSA PCR>> negative 12/22: Blood culture x2>>No growth thus far 12/22: Urine Culture>>no growth   Antimicrobials:  Unasyn 12/18 >> stop 12/22 Augmentin 12/21 >> 12/22 Cefepime 12/22>> Vancomycin 12/22>> Interim History / Subjective:  -Afebrile, hemodynamically stable -Remains on HFNC with high risk for intubation -Labs, ABGs and Xray obtained -Daily total I&O -1500  OBJECTIVE   Blood pressure 123/76, pulse (!) 109, temperature 98.3 F (36.8 C), temperature source Axillary, resp. rate (!) 32, height 5\' 5"  (1.651 m), weight 54.6 kg, SpO2 (!) 88 %.    FiO2 (%):  [55 %-75 %] 75 %   Intake/Output Summary (Last 24 hours) at 06/25/2021 1959 Last data filed at 06/25/2021 1800 Gross per 24 hour  Intake 100 ml  Output 1900 ml  Net -1800 ml   Filed Weights   06/21/21 1239 06/22/21 0331 06/25/21 1857  Weight: 54.4 kg 48.2 kg 54.6 kg   Physical Examination: GENERAL:74 age-year-old patient lying in the bed with no acute distress. HFNC + EYES: Pupils equal, round, reactive to light and accommodation. No scleral icterus. Extraocular muscles intact.  HEENT: Head atraumatic, normocephalic. Oropharynx and nasopharynx clear.  NECK:  Supple, no  jugular venous distention. No thyroid enlargement, no tenderness.  LUNGS: Decreased  breath sounds bilaterally, no  wheezing, rales,rhonchi or crepitation. No use of accessory muscles of respiration.  CARDIOVASCULAR: S1, S2 normal. No murmurs, rubs, or gallops.  ABDOMEN: Soft, nontender, nondistended. Bowel sounds present. No organomegaly or mass. PEG+ EXTREMITIES: No pedal edema, cyanosis, or clubbing.  NEUROLOGIC: Cranial nerves II through XII are intact. Muscle strength 5/5 in all extremities. Sensation intact. Gait not checked.  PSYCHIATRIC: The patient is alert and oriented x 3.  SKIN: No obvious rash, lesion, or ulcer.   Labs/imaging that I havepersonally reviewed  (right click and "Reselect all SmartList Selections" daily)    Labs   CBC: Recent Labs  Lab 06/21/21 1244 06/22/21 0545 06/23/21 0521 06/25/21 1937  WBC 11.4* 9.8 11.4* 14.7*  NEUTROABS  --   --   --  PENDING  HGB 13.9 14.1 12.1* 13.0  HCT 44.8 46.0 39.2 41.6  MCV 83.4 83.8 84.8 82.1  PLT 243 227 192 094    Basic Metabolic Panel: Recent Labs  Lab 06/22/21 0545 06/22/21 1646 06/23/21 0521 06/23/21 1158 06/23/21 1545 06/24/21 0005 06/24/21 0435 06/24/21 1439 06/25/21 0328  NA 162* 161* 154*  --  154* 151* 153* 148* 146*  K 3.8 4.0 3.5  --   --   --  3.6  --  3.7  CL 123* 123* 119*  --   --   --  116*  --  111  CO2 31 30 28   --   --   --  31  --  30  GLUCOSE 131* 270* 390* 461*  --   --  135*  --  283*  BUN 56* 53* 38*  --   --   --  30*  --  30*  CREATININE 1.15 1.33* 1.11  --   --   --  0.99  --  1.03  CALCIUM 9.6 9.4 9.1  --   --   --  9.0  --  8.5*  MG  --   --  2.6*  --   --   --  2.4  --   --   PHOS  --   --  2.4*  --   --   --  3.3  --   --    GFR: Estimated Creatinine Clearance: 52.3 mL/min (by C-G formula based on SCr of 1.03 mg/dL). Recent Labs  Lab 06/21/21 1244 06/21/21 1458 06/21/21 1824 06/22/21 0545 06/23/21 0521 06/25/21 1937  PROCALCITON 5.35  --   --   --   --   --   WBC 11.4*  --   --  9.8 11.4* 14.7*  LATICACIDVEN  --  2.2* 1.8  --   --   --     Liver Function Tests: Recent Labs   Lab 06/21/21 1244  AST 28  ALT 114*  ALKPHOS 101  BILITOT 0.8  PROT 8.8*  ALBUMIN 3.4*   No results for input(s): LIPASE, AMYLASE in the last 168 hours. No results for input(s): AMMONIA in the last 168 hours.  ABG    Component Value Date/Time   PHART 7.49 (H) 06/25/2021 1826   PCO2ART 44 06/25/2021 1826   PO2ART 48 (L) 06/25/2021 1826   HCO3 33.5 (H) 06/25/2021 1826   TCO2 35 (H) 07/17/2020 0038   ACIDBASEDEF 1.3 02/01/2021 1209   O2SAT 86.9 06/25/2021 1826     Coagulation Profile: No results for input(s): INR, PROTIME in the last 168 hours.  Cardiac Enzymes: No results for input(s): CKTOTAL, CKMB, CKMBINDEX, TROPONINI in the last 168 hours.  HbA1C: Hgb A1c MFr Bld  Date/Time Value Ref Range Status  06/23/2021 11:58 AM 8.3 (H) 4.8 - 5.6 % Final    Comment:    (NOTE)         Prediabetes: 5.7 - 6.4         Diabetes: >6.4         Glycemic control for adults with diabetes: <7.0   02/02/2021 12:38 AM 6.8 (H) 4.8 - 5.6 % Final    Comment:    (NOTE) Pre diabetes:          5.7%-6.4%  Diabetes:              >6.4%  Glycemic control for   <7.0% adults with diabetes     CBG: Recent Labs  Lab 06/25/21 0413 06/25/21 0847 06/25/21 1213 06/25/21 1837 06/25/21 1907  GLUCAP 297* 312* 275* 152* 186*    Allergies No Known Allergies   Home Medications  Prior to Admission medications   Medication Sig Start Date End Date Taking? Authorizing Provider  acetaminophen (TYLENOL) 325 MG tablet Take 650 mg by mouth every 6 (six) hours as needed for headache or mild pain.   Yes [provider]  albuterol (PROVENTIL HFA;VENTOLIN HFA) 108 (90 Base) MCG/ACT inhaler Inhale 2 puffs into the lungs every 6 (six) hours as needed for wheezing.   Yes [provider]  albuterol (PROVENTIL) (2.5 MG/3ML) 0.083% nebulizer solution Inhale 3 mLs into the lungs every 6 (six) hours as needed for wheezing. 06/09/21  Yes Swayze, Ava, DO  guaiFENesin (MUCINEX) 600 MG 12 hr  tablet Take 2 tablets (1,200 mg total) by mouth 2 (two) times daily. 06/09/21 06/09/22 Yes Swayze, Ava, DO  omeprazole (PRILOSEC) 40 MG capsule Take 40 mg by mouth daily. 06/06/20  Yes [provider]  TOUJEO SOLOSTAR 300 UNIT/ML Solostar Pen Inject 15 Units into the skin daily. Patient taking differently: Inject 25 Units into the skin at bedtime. 07/21/20  Yes Dahal, Marlowe Aschoff, MD  Water For Irrigation, Sterile (FREE WATER) SOLN Place 100 mLs into feeding tube 6 (six) times daily. 03/21/21  Yes Wouk, Ailene Rud, MD  atorvastatin (LIPITOR) 20 MG tablet Place 1 tablet (20 mg total) into feeding tube daily. 03/21/21   Wouk, Ailene Rud, MD  famotidine (PEPCID) 40 MG tablet Place 1 tablet (40 mg total) into feeding tube daily. 03/21/21   Wouk, Ailene Rud, MD  metoprolol succinate (TOPROL-XL) 25 MG 24 hr tablet Take 0.5 tablets (12.5 mg total) by mouth daily. 06/10/21   Swayze, Ava, DO  Nutritional Supplements (FEEDING SUPPLEMENT, JEVITY 1.5 CAL/FIBER,) LIQD Place 237 mLs into feeding tube 6 (six) times daily. 06/09/21   Swayze, Ava, DO  Scheduled Meds:  acetylcysteine  1 mL Nebulization Once   budesonide (PULMICORT) nebulizer solution  0.25 mg Nebulization BID   Chlorhexidine Gluconate Cloth  6 each Topical Q0600   enoxaparin (LOVENOX) injection  40 mg Subcutaneous QHS   famotidine  20 mg Per Tube Daily   [START ON 06/26/2021] furosemide  40 mg Intravenous Once   insulin aspart  0-15 Units Subcutaneous Q4H   insulin aspart  0-5 Units Subcutaneous QHS   insulin aspart  5 Units Subcutaneous Q4H   [START ON 06/26/2021] insulin glargine-yfgn  25 Units Subcutaneous Daily   methylPREDNISolone (SOLU-MEDROL) injection  20 mg Intravenous Q12H   Continuous Infusions:  ceFEPime (MAXIPIME) IV     vancomycin  PRN Meds:.acetaminophen (TYLENOL) oral liquid 160 mg/5 mL, albuterol, guaiFENesin, metoprolol tartrate, ondansetron (ZOFRAN) IV  ASSESSMENT & PLAN  Acute Hypoxic Respiratory Failure secondary  to multilobar Pneumonia with possible Aspiration and Mucus Plugging PMHx: Emphysema with prior smoking Initially treated with Unasyn and transitioned to PO Augmentin on 12/21 but clinically worsened and changed back to Unasyn 12/22. BCx: +Staph Hominis, Staph Haemolyticus which is usually resistant to methicillin and may be glycopeptide resistant, given worsening hypoxia, chest xray, lactid acidosis and white count,  will broaded coverage with Vancomycin and Cefepime. -Continue BIPAP/HHFNC overnight, wean FiO2 as tolerated -Supplemental O2 to maintain SpO2 >88% -F/u cultures, trend PCT -Methylprednisolone IV 20 mg BID -Trend PCT, monitor WBC/ fever curve -Intermittent chest x-ray & ABG PRN -Budesonide inhaler nebs BID, bronchodilators PRNBronchodilators PRN  Acute Kidney Injury Hypernatremia~improving Lactic Acidosis -Monitor I&O's / urinary output -Follow BMP -Ensure adequate renal perfusion -Avoid nephrotoxic agents as able -Replace electrolytes as indicated -Nephrology following, appreciate input -Continue 0.225%saline @ 56ml/hr and free water flushes 100 cc q4h as per Nephrology, stopped by primary due to concerns for volume overload. 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 CT abdomen pelvis, small areas of pneumoperitoneum present, evalauted by general surgery with plans for serial abdominal xrays to monitor for changes -Ok to use PEG per surgery as of 12/19 -Tube feeds resumed by primary  Best practice (right click and "Reselect all SmartList Selections" daily)  Diet:  Tube Feed  Pain/Anxiety/Delirium protocol (if indicated): No VAP protocol  (if indicated): Not indicated DVT prophylaxis: LMWH GI prophylaxis: H2B Glucose control:  SSI Yes and Basal insulin Yes Central venous access:  N/A Arterial line:  N/A Foley:  N/A Mobility:  bed rest  PT consulted: N/A Last date of multidisciplinary goals of care discussion [12/22] Code Status:  full code Disposition: Stepdown  Critical care time: 45 minutes       Rufina Falco, DNP, FNP-C, AGACNP-BC Acute Care Nurse Practitioner  [facility] Pager: [phone] [place] [facility]

## 2021-06-25 NOTE — Progress Notes (Signed)
eLink Physician-Brief Progress Note Patient Name: Louis Stone DOB: 08-11-1950 MRN: [id number]   Date of Service  06/25/2021  HPI/Events of Note  50 M history of oropharyngeal CA s/p Gtube, DM, dyslipidemia initially presented on 12/18 with cough and shortness of breath. Imaging with RLL infiltrate compatible with aspiration pneumonia but was also noted to have pneumoperitoneum. Surgery consulted and free air attributed to leak around G tube. Also with AKI improved. Unasyn for aspiration de-escalated to Augmentin.  eICU Interventions  Transferred to ICU overnight due to worsening respiratory status. Now on heated HFNC. Antibiotics escalated to vancomycin and cefepime.        Judd Lien 06/25/2021, 9:04 PM

## 2021-06-25 NOTE — Progress Notes (Signed)
Patient transferred to 245. Patient A&O at time of transfer, on 13L HFNC and resting comfortably. Report called to [place], Therapist, sports.

## 2021-06-25 NOTE — Progress Notes (Addendum)
PROGRESS NOTE   Louis Stone  PXT:[id number] DOB: 07-14-1950 DOA: 06/21/2021 PCP: Theotis Burrow, MD  Brief Narrative:   70 year old community dwelling black male History TN2 SCC oral cancer 2009-aspiration pneumonia + severe dysphagia on 03/12/2021 admission-was using PEG tube at the time  Recent admission 12/2 through 06/09/2021 with profound hypoglycemia discharged with home health Return to emergency room by EMS with CBG over 500 and was disoriented WBC 11.4 anion gap 9 CXR right lower lobe infiltrate CT head negative for stroke  Patient has lost 20 pounds was using Ensure bought from store [supposed to be on jevity]  Developed oxygen requirement-ultimately placed on BiPAP Found on scans 12/19 with pneumoperitoneum--General surgery consulted but this was felt to be possibly air leakage around G-tube  Nephrology consulted in addition Decompensated 12/22 with worsening respiratory failure requiring transfer to [facility] and Pulmonology was consulted additionally  Hospital-Problem based course  Acute hypoxic respiratory failure secondary to aspiration pneumonia Possible iatrogenic vol overload from hypernatremia correction Emphysema with prior smoking Was on 93% on 2 L  Oxygen requirements have progressively worsening today resulting with patient being transferred to SDU and being placed on Bipap CXR shows to my read slightly more plethoric Lateral lung fields--given IV Lasix 40 once--aim for net neg balance Keep lasix at 40 iv bid for now Pulmonary consulted to assess patient--will get ABG ~ 19:30 Patient iterated to me he would want "everything done" including intubation Squamous cell oral cancer diagnosed 2009 G-tube dependent Given severe dysphagia recommend strict n.p.o. until gains weight or become stronger and can be rechecked as an outpatient Feeds as per nutritionist Poorly controlled diabetes mellitus with DKA this admission and profound hypoglycemia last  admission A1c 6.8 CBGs 275-312 Increase Lantus--> 24 units, continue moderate coverage every 4 checks AKI on admission, severe hyponatremia on admission--Baseline creatinine 0.6-- on admission 1.5 Sodium now trending downwards to 146 All IVF and Free water stopped given possibility fluid overload Keep net neg balance at this time Adult failure to thrive in the setting of cancer   DVT prophylaxis: Lovenox Code Status: Full Family Communication: long discussion with step-daughter Tiffany Corprew [phone]--discussed with her subsequently in the PM Disposition:  Status is: Inpatient  Remains inpatient appropriate because: Still hypernatremic       Consultants:  Multiple  Procedures:   Antimicrobials:   Currently Unasyn   Subjective:  Patient required escalating amounts of oxygen Thick tan secretions in back of throat after being suctioned by respiratory He is just being put on Bi-pap when I reassessed him after this am--and was transferred up to progressive bed  Objective: Vitals:   06/25/21 1306 06/25/21 1421 06/25/21 1525 06/25/21 1644  BP: 125/72 (!) 96/56  133/90  Pulse: (!) 121 (!) 108  (!) 112  Resp: (!) 22 (!) 24  (!) 24  Temp: (!) 101.2 F (38.4 C) 99.9 F (37.7 C)  98.4 F (36.9 C)  TempSrc:  Oral  Oral  SpO2: (!) 86% 91% 92% 90%  Weight:      Height:        Intake/Output Summary (Last 24 hours) at 06/25/2021 1756 Last data filed at 06/25/2021 1500 Gross per 24 hour  Intake --  Output 2100 ml  Net -2100 ml    Filed Weights   06/21/21 1239 06/22/21 0331  Weight: 54.4 kg 48.2 kg    Examination:  EOMI NCAT no focal deficit Cachectic slightly sleepy listless ill-appearing Clear no added sound no rales no rhonchi S1-S2 slightly tacky PEG tube  in place No lower extremity edema Sacrum not examined S1-S2 no murmur  Data Reviewed: personally reviewed   CBC    Component Value Date/Time   WBC 11.4 (H) 06/23/2021 0521   RBC 4.62  06/23/2021 0521   HGB 12.1 (L) 06/23/2021 0521   HCT 39.2 06/23/2021 0521   PLT 192 06/23/2021 0521   MCV 84.8 06/23/2021 0521   MCH 26.2 06/23/2021 0521   MCHC 30.9 06/23/2021 0521   RDW 14.6 06/23/2021 0521   LYMPHSABS 1.2 06/09/2021 0436   MONOABS 0.5 06/09/2021 0436   EOSABS 0.1 06/09/2021 0436   BASOSABS 0.0 06/09/2021 0436   CMP Latest Ref Rng & Units 06/25/2021 06/24/2021 06/24/2021  Glucose 70 - 99 mg/dL 283(H) - 135(H)  BUN 8 - 23 mg/dL 30(H) - 30(H)  Creatinine 0.61 - 1.24 mg/dL 1.03 - 0.99  Sodium 135 - 145 mmol/L 146(H) 148(H) 153(H)  Potassium 3.5 - 5.1 mmol/L 3.7 - 3.6  Chloride 98 - 111 mmol/L 111 - 116(H)  CO2 22 - 32 mmol/L 30 - 31  Calcium 8.9 - 10.3 mg/dL 8.5(L) - 9.0  Total Protein 6.5 - 8.1 g/dL - - -  Total Bilirubin 0.3 - 1.2 mg/dL - - -  Alkaline Phos 38 - 126 U/L - - -  AST 15 - 41 U/L - - -  ALT 0 - 44 U/L - - -     Radiology Studies: DG Chest Port 1 View  Result Date: 06/25/2021 CLINICAL DATA:  Pneumonia.  History of diabetes. EXAM: PORTABLE CHEST 1 VIEW COMPARISON:  CT 06/22/2021.  Radiographs 06/21/2021 and 06/05/2021. FINDINGS: 1417 hours. The heart size and mediastinal contours are stable. Extensive lower lobe predominant airspace opacities are again noted, right greater than left. These have slightly worsened compared with the most recent radiographs, but are similar to the CT of 3 days ago. No evidence of pneumothorax or significant pleural effusion. Telemetry leads overlie the chest. IMPRESSION: No significant change in multilobar bronchopneumonia compared with CT of 3 days ago. No significant pleural effusion. Electronically Signed   By: Richardean Sale M.D.   On: 06/25/2021 14:33     Scheduled Meds:  acetylcysteine  1 mL Nebulization Once   atorvastatin  20 mg Per Tube Daily   Chlorhexidine Gluconate Cloth  6 each Topical Q0600   enoxaparin (LOVENOX) injection  40 mg Subcutaneous QHS   famotidine  20 mg Per Tube Daily   insulin aspart   0-15 Units Subcutaneous Q4H   insulin aspart  0-5 Units Subcutaneous QHS   insulin aspart  5 Units Subcutaneous Q4H   insulin glargine-yfgn  22 Units Subcutaneous Daily   Continuous Infusions:  ampicillin-sulbactam (UNASYN) IV 3 g (06/25/21 1440)     LOS: 4 days   Time spent:   CRITICAL CARE Performed by: Nita Sells   Total critical care time: 45 minutes  Critical care time was exclusive of separately billable procedures and treating other patients.  Critical care was necessary to treat or prevent imminent or life-threatening deterioration.  Critical care was time spent personally by me on the following activities: development of treatment plan with patient and/or surrogate as well as nursing, discussions with consultants, evaluation of patient's response to treatment, examination of patient, obtaining history from patient or surrogate, ordering and performing treatments and interventions, ordering and review of laboratory studies, ordering and review of radiographic studies, pulse oximetry and re-evaluation of patient's condition.   Nita Sells, MD [facility] To contact the attending provider between 7A-7P  or the covering provider during after hours 7P-7A, please log into the web site [email or web address] and access using universal [place] password for that web site. If you do not have the password, please call the hospital operator.  06/25/2021, 5:56 PM

## 2021-06-25 NOTE — Consult Note (Signed)
Pharmacy Antibiotic Note  Louis Stone is a 70 y.o. male admitted on 06/21/2021 with pneumonia.  Pharmacy has been consulted for antibiotic recommendation as pt is not responding to current regimen.  Plan: Pt re-admitted to ICU after ABX transition from IV to PO.  Organisms : Staph hominis and staph haemolyticus. Discussed w/provider and as a high percentage of isolates of these organisms are methcillin resistance, decided to use broad spectrum coverage plus vanco pending further studies. At provider request, ordered sensitivities on the isolates grown from 12/18 sample.  Height: 5\' 5"  (165.1 cm) Weight: 54.6 kg (120 lb 5.9 oz) IBW/kg (Calculated) : 61.5  Temp (24hrs), Avg:99 F (37.2 C), Min:97.5 F (36.4 C), Max:101.2 F (38.4 C)  Recent Labs  Lab 06/21/21 1244 06/21/21 1458 06/21/21 1824 06/22/21 0151 06/22/21 0545 06/22/21 1646 06/23/21 0521 06/24/21 0435 06/25/21 0328 06/25/21 1937  WBC 11.4*  --   --   --  9.8  --  11.4*  --   --  14.7*  CREATININE 1.52*  --   --    < > 1.15 1.33* 1.11 0.99 1.03  --   LATICACIDVEN  --  2.2* 1.8  --   --   --   --   --   --   --    < > = values in this interval not displayed.    Estimated Creatinine Clearance: 52.3 mL/min (by C-G formula based on SCr of 1.03 mg/dL).    No Known Allergies  Antimicrobials this admission: Unasyn 12/18 >> 12/21 Augmentin 12/21 >> 12/22    Microbiology results: 12/18 BCx: positive for staph hominis and staph haemolyticus.   Thank you for allowing pharmacy to be a part of this patients care.  Berta Minor 06/25/2021 8:06 PM

## 2021-06-26 ENCOUNTER — Other Ambulatory Visit: Payer: Self-pay

## 2021-06-26 ENCOUNTER — Inpatient Hospital Stay
Admit: 2021-06-26 | Discharge: 2021-06-26 | Disposition: A | Discharge status: Home / Self Care | Payer: Medicare Other | Attending: Nurse Practitioner | Admitting: Nurse Practitioner

## 2021-06-26 DIAGNOSIS — J690 Pneumonitis due to inhalation of food and vomit: Secondary | ICD-10-CM | POA: Diagnosis not present

## 2021-06-26 LAB — GLUCOSE, CAPILLARY
Glucose-Capillary: 126 mg/dL — ABNORMAL HIGH (ref 70–99)
Glucose-Capillary: 184 mg/dL — ABNORMAL HIGH (ref 70–99)
Glucose-Capillary: 366 mg/dL — ABNORMAL HIGH (ref 70–99)
Glucose-Capillary: 389 mg/dL — ABNORMAL HIGH (ref 70–99)
Glucose-Capillary: 403 mg/dL — ABNORMAL HIGH (ref 70–99)
Glucose-Capillary: 404 mg/dL — ABNORMAL HIGH (ref 70–99)
Glucose-Capillary: 405 mg/dL — ABNORMAL HIGH (ref 70–99)
Glucose-Capillary: 95 mg/dL (ref 70–99)

## 2021-06-26 LAB — CBC WITH DIFFERENTIAL/PLATELET
Abs Immature Granulocytes: 0 10*3/uL (ref 0.00–0.07)
Basophils Absolute: 0 10*3/uL (ref 0.0–0.1)
Basophils Relative: 0 %
Eosinophils Absolute: 0 10*3/uL (ref 0.0–0.5)
Eosinophils Relative: 0 %
HCT: 39.6 % (ref 39.0–52.0)
Hemoglobin: 12.5 g/dL — ABNORMAL LOW (ref 13.0–17.0)
Lymphocytes Relative: 8 %
Lymphs Abs: 1.4 10*3/uL (ref 0.7–4.0)
MCH: 25.6 pg — ABNORMAL LOW (ref 26.0–34.0)
MCHC: 31.6 g/dL (ref 30.0–36.0)
MCV: 81.1 fL (ref 80.0–100.0)
Monocytes Absolute: 0.9 10*3/uL (ref 0.1–1.0)
Monocytes Relative: 5 %
Neutro Abs: 14.9 10*3/uL — ABNORMAL HIGH (ref 1.7–7.7)
Neutrophils Relative %: 87 %
Platelets: 169 10*3/uL (ref 150–400)
RBC: 4.88 MIL/uL (ref 4.22–5.81)
RDW: 14.6 % (ref 11.5–15.5)
WBC: 17.1 10*3/uL — ABNORMAL HIGH (ref 4.0–10.5)
nRBC: 0 % (ref 0.0–0.2)
nRBC: 1 /100 WBC — ABNORMAL HIGH

## 2021-06-26 LAB — CULTURE, BLOOD (ROUTINE X 2): Culture: NO GROWTH

## 2021-06-26 LAB — ECHOCARDIOGRAM COMPLETE
AV Mean grad: 3 mmHg
AV Peak grad: 5.7 mmHg
Ao pk vel: 1.19 m/s
Area-P 1/2: 4.96 cm2
Calc EF: 53.8 %
Height: 65 in
Single Plane A2C EF: 53.9 %
Single Plane A4C EF: 54.2 %
Weight: 1925.94 oz

## 2021-06-26 LAB — COMPREHENSIVE METABOLIC PANEL
ALT: 87 U/L — ABNORMAL HIGH (ref 0–44)
AST: 73 U/L — ABNORMAL HIGH (ref 15–41)
Albumin: 2.4 g/dL — ABNORMAL LOW (ref 3.5–5.0)
Alkaline Phosphatase: 109 U/L (ref 38–126)
Anion gap: 10 (ref 5–15)
BUN: 27 mg/dL — ABNORMAL HIGH (ref 8–23)
CO2: 30 mmol/L (ref 22–32)
Calcium: 8.9 mg/dL (ref 8.9–10.3)
Chloride: 107 mmol/L (ref 98–111)
Creatinine, Ser: 0.99 mg/dL (ref 0.61–1.24)
GFR, Estimated: >60 mL/min (ref >60)
Glucose, Bld: 109 mg/dL — ABNORMAL HIGH (ref 70–99)
Potassium: 3.7 mmol/L (ref 3.5–5.1)
Sodium: 147 mmol/L — ABNORMAL HIGH (ref 135–145)
Total Bilirubin: 1 mg/dL (ref 0.3–1.2)
Total Protein: 7.3 g/dL (ref 6.5–8.1)

## 2021-06-26 LAB — MAGNESIUM: Magnesium: 2.5 mg/dL — ABNORMAL HIGH (ref 1.7–2.4)

## 2021-06-26 LAB — PROCALCITONIN: Procalcitonin: 15.54 ng/mL

## 2021-06-26 MED ORDER — METOPROLOL SUCCINATE ER 25 MG PO TB24
12.5000 mg | ORAL_TABLET | Freq: Every day | ORAL | Status: DC
  Administered 2021-06-26 – 2021-06-27 (×2): 12.5 mg via ORAL
  Filled 2021-06-26 (×3): qty 0.5

## 2021-06-26 MED ORDER — INSULIN ASPART 100 UNIT/ML IJ SOLN
5.0000 [IU] | Freq: Once | INTRAMUSCULAR | Status: AC
  Administered 2021-06-26: 21:00:00 5 [IU] via SUBCUTANEOUS
  Filled 2021-06-26: qty 1

## 2021-06-26 MED ORDER — INSULIN REGULAR(HUMAN) IN NACL 100-0.9 UT/100ML-% IV SOLN
INTRAVENOUS | Status: DC
  Administered 2021-06-26: 14 [IU]/h via INTRAVENOUS
  Administered 2021-06-27: 04:00:00 15 [IU]/h via INTRAVENOUS
  Filled 2021-06-26 (×2): qty 100

## 2021-06-26 MED ORDER — FREE WATER
100.0000 mL | Status: DC
  Administered 2021-06-26 – 2021-06-27 (×4): 100 mL

## 2021-06-26 MED ORDER — INSULIN GLARGINE-YFGN 100 UNIT/ML ~~LOC~~ SOLN
10.0000 [IU] | Freq: Every day | SUBCUTANEOUS | Status: DC
  Administered 2021-06-26: 22:00:00 10 [IU] via SUBCUTANEOUS
  Filled 2021-06-26: qty 0.1

## 2021-06-26 MED ORDER — INSULIN ASPART 100 UNIT/ML IJ SOLN
0.0000 [IU] | INTRAMUSCULAR | Status: DC
  Administered 2021-06-26: 21:00:00 20 [IU] via SUBCUTANEOUS
  Filled 2021-06-26: qty 1

## 2021-06-26 MED ORDER — SODIUM CHLORIDE 0.9 % IV SOLN: INTRAVENOUS | Status: DC | PRN

## 2021-06-26 MED ORDER — DEXTROSE 50 % IV SOLN: 0.0000 mL | INTRAVENOUS | Status: DC | PRN

## 2021-06-26 MED ORDER — OSMOLITE 1.5 CAL PO LIQD
1000.0000 mL | ORAL | Status: DC
  Administered 2021-06-26 – 2021-07-04 (×9): 1000 mL

## 2021-06-26 NOTE — Progress Notes (Signed)
*  PRELIMINARY RESULTS* Echocardiogram 2D Echocardiogram has been performed.  Louis Stone 06/26/2021, 12:23 PM

## 2021-06-26 NOTE — Progress Notes (Signed)
12 Beat run of Vtach captured on cardiac monitor at 07:02:54--- new oncoming RN Verdis Frederickson notified and given a copy of the strip report.

## 2021-06-26 NOTE — Consult Note (Signed)
Pharmacy Antibiotic Note  Louis Stone is a 70 y.o. male admitted on 06/21/2021 with  aspiration pneumonia .  Was initially treated with Unasyn d/t SOB and acute respiratory failure. He was changed to PO Augmentin on 12/21 but clinically worsened and changed to Unasyn 12/22. Patient transferred to the ICU later 12/22 and antibiotics changed to vancomycin and cefepime.Renal function is now stable and at apparent baseline level.  Plan:   1) continue vancomycin 1000 mg IV q24h Goal AUC 400-550 Expected AUC: 494 SCr used: 0.99 mg/dL Daily SCr while on IV vancomycin  2) continue cefepime 2 grams IV every 12 hours   Height: 5\' 5"  (165.1 cm) Weight: 54.6 kg (120 lb 5.9 oz) IBW/kg (Calculated) : 61.5  Temp (24hrs), Avg:98.9 F (37.2 C), Min:97.5 F (36.4 C), Max:101.2 F (38.4 C)  Recent Labs  Lab 06/21/21 1244 06/21/21 1458 06/21/21 1824 06/22/21 0151 06/22/21 0545 06/22/21 1646 06/23/21 0521 06/24/21 0435 06/25/21 0328 06/25/21 1937 06/26/21 0409  WBC 11.4*  --   --   --  9.8  --  11.4*  --   --  14.7* 17.1*  CREATININE 1.52*  --   --    < > 1.15   < > 1.11 0.99 1.03 0.95 0.99  LATICACIDVEN  --  2.2* 1.8  --   --   --   --   --   --  2.7*  --    < > = values in this interval not displayed.     Estimated Creatinine Clearance: 54.4 mL/min (by C-G formula based on SCr of 0.99 mg/dL).    No Known Allergies  Antimicrobials this admission: Unasyn 12/18 >> 12/22 Augmentin 12/21 >> 12/22 cefepime 12/22 >> vancomycin 12/22 >>  Microbiology results: 12/22 Bcx: NG<12 hours 12/18 BCx: Staph hominis, Staph haemolyticus 12/18 MRSA PCR: negative  Thank you for allowing pharmacy to be a part of this patients care.  Dallie Piles, PharmD, BCPS Clinical Pharmacist 06/26/2021 7:24 AM

## 2021-06-26 NOTE — Progress Notes (Addendum)
PROGRESS NOTE   Louis Stone  TKZ:[id number] DOB: 11-27-1950 DOA: 06/21/2021 PCP: Theotis Burrow, MD  Brief Narrative:   70 year old community dwelling black male History TN2 SCC oral cancer 2009-aspiration pneumonia + severe dysphagia on 03/12/2021 admission-was using PEG tube at the time  Recent admission 12/2 through 06/09/2021 with profound hypoglycemia discharged with home health Return to emergency room by EMS with CBG over 500 and was disoriented WBC 11.4 anion gap 9 CXR right lower lobe infiltrate CT head negative for stroke  Patient has lost 20 pounds was using Ensure bought from store [supposed to be on jevity]  Developed oxygen requirement-ultimately placed on BiPAP Found on scans 12/19 with pneumoperitoneum--General surgery consulted but this was felt to be possibly air leakage around G-tube  Nephrology consulted in addition Decompensated 12/22 with worsening respiratory failure requiring transfer to [facility] and Pulmonology was consulted additionally  Hospital-Problem based course  Acute hypoxic respiratory failure secondary to a severe sepsis from spiration pneumonia--BC 12/18 growing staph hemolyticus, staph hominis Possible iatrogenic vol overload from hypernatremia correction Emphysema with prior smoking Transferred to SDU 12/22 critical care consulted Antibiotics broadened to cefepime and vancomycin--procalcitonin 3.2 --->15 do not de-escalate antibiotics at this time  Blood recultured-12/22-follow result-attempt sputum culture  follow urine culture 12/22 Continue Solu-Medrol  20 every 12  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 tach 0 700 this a.m. Has had persistent sinus tach probably in keeping with sepsis physiology Add Toprol XL 12.5 daily Continue metoprolol IV 5 mg every 6 for heart rate above 120 and increase dose if needed Check magnesium in am--currently 2.5 Adult failure to thrive in the setting of cancer   DVT prophylaxis: Lovenox Code Status: Full Family Communication: long discussion with step-daughter Tiffany Corprew [phone]--[email or web address] Disposition:  Status is: Inpatient  Remains inpatient appropriate because: Still hypernatremic  Consultants:  Multiple  Procedures:   Antimicrobials:   Currently Unasyn   Subjective:  Seen on SDU He seems comfortable and has been taken off of Bipap--now on HI flow canula 70% Doesn't seem to have CP Feeds have been held  Objective: Vitals:   06/26/21 0500 06/26/21 0600 06/26/21 0700 06/26/21 0742  BP: 111/74 114/77 116/75   Pulse:   (!) 104   Resp:   (!) 23   Temp:    98 F (36.7 C)  TempSrc:    Oral  SpO2:   94%   Weight:      Height:        Intake/Output Summary (Last 24 hours) at 06/26/2021 0805 Last data filed at 06/26/2021 0600 Gross per 24 hour  Intake 100 ml  Output 1950 ml  Net -1850 ml    Filed Weights   06/21/21 1239 06/22/21 0331 06/25/21 1857  Weight: 54.4 kg 48.2 kg 54.6 kg    Examination:  Awake coherent in nad no focal deficit, cachectic AE diminished posteriorly S1 s2 no m--sinus/Sinus tach on monitors Abd soft nt nd no rebound No LE edema  Follows commands  Data Reviewed: personally reviewed   CBC    Component Value Date/Time   WBC 17.1 (H) 06/26/2021 0409   RBC 4.88 06/26/2021 0409   HGB 12.5 (L) 06/26/2021 0409   HCT  39.6 06/26/2021 0409   PLT 169 06/26/2021 0409   MCV 81.1 06/26/2021 0409   MCH 25.6 (L) 06/26/2021 0409   MCHC 31.6 06/26/2021 0409   RDW 14.6 06/26/2021 0409   LYMPHSABS 1.4 06/26/2021 0409   MONOABS 0.9 06/26/2021 0409   EOSABS 0.0 06/26/2021 0409   BASOSABS 0.0 06/26/2021 0409   CMP Latest Ref Rng & Units 06/26/2021 06/25/2021 06/25/2021  Glucose 70 - 99 mg/dL 109(H) 143(H) 283(H)  BUN 8 - 23 mg/dL 27(H) 23 30(H)  Creatinine 0.61 - 1.24 mg/dL 0.99 0.95 1.03  Sodium 135 - 145 mmol/L 147(H) 146(H) 146(H)  Potassium 3.5 - 5.1 mmol/L 3.7 3.5 3.7  Chloride 98 - 111 mmol/L 107 104 111  CO2 22 - 32 mmol/L 30 32 30  Calcium 8.9 - 10.3 mg/dL 8.9 9.0 8.5(L)  Total Protein 6.5 - 8.1 g/dL 7.3 - -  Total Bilirubin 0.3 - 1.2 mg/dL 1.0 - -  Alkaline Phos 38 - 126 U/L 109 - -  AST 15 - 41 U/L 73(H) - -  ALT 0 - 44 U/L 87(H) - -     Radiology Studies: DG Chest Port 1 View  Result Date: 06/25/2021 CLINICAL DATA:  Pneumonia.  History of diabetes. EXAM: PORTABLE CHEST 1 VIEW COMPARISON:  CT 06/22/2021.  Radiographs 06/21/2021 and 06/05/2021. FINDINGS: 1417 hours. The heart size and mediastinal contours are stable. Extensive lower lobe predominant airspace opacities are again noted, right greater than left. These have slightly worsened compared with the most recent radiographs, but are similar to the CT of 3 days ago. No evidence of pneumothorax or significant pleural effusion. Telemetry leads overlie the chest. IMPRESSION: No significant change in multilobar bronchopneumonia compared with CT of 3 days ago. No significant pleural effusion. Electronically Signed   By: Richardean Sale M.D.   On: 06/25/2021 14:33     Scheduled Meds:  acetylcysteine  1 mL Nebulization Once   budesonide (PULMICORT) nebulizer solution  0.25 mg Nebulization BID   Chlorhexidine Gluconate Cloth  6 each Topical Q0600   enoxaparin (LOVENOX) injection  40 mg Subcutaneous QHS   famotidine  20 mg Per Tube Daily   insulin  aspart  0-15 Units Subcutaneous Q4H   insulin aspart  0-5 Units Subcutaneous QHS   insulin aspart  5 Units Subcutaneous Q4H   insulin glargine-yfgn  25 Units Subcutaneous Daily   methylPREDNISolone (SOLU-MEDROL) injection  20 mg Intravenous Q12H   Continuous Infusions:  ceFEPime (MAXIPIME) IV 2 g (06/26/21 1008)   vancomycin       LOS: 5 days   Time spent: 30  Nita Sells, MD [facility] To contact the attending provider between 7A-7P or the covering provider during after hours 7P-7A, please log into the web site [email or web address] and access using universal [place] password for that web site. If you do not have the password, please call the hospital operator.  06/26/2021, 8:05 AM
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
## 2021-06-26 NOTE — Progress Notes (Signed)
[facility]  ROUNDING NOTE   Subjective:   Patient moved back to critical care unit due to respiratory distress. Initially was on BiPAP yesterday but now transition to high flow nasal cannula. Suspect worsening pneumonia. Taken off of quarter normal saline. Serum sodium 146.   Objective:  Vital signs in last 24 hours:  Temp:  [97.5 F (36.4 C)-101.2 F (38.4 C)] 98 F (36.7 C) (12/23 0742) Pulse Rate:  [98-124] 99 (12/23 1000) Resp:  [18-38] 24 (12/23 1000) BP: (89-182)/(56-112) 100/71 (12/23 1000) SpO2:  [65 %-99 %] 96 % (12/23 1000) FiO2 (%):  [55 %-75 %] 70 % (12/23 0814) Weight:  [54.6 kg] 54.6 kg (12/22 1857)  Weight change:  Filed Weights   06/21/21 1239 06/22/21 0331 06/25/21 1857  Weight: 54.4 kg 48.2 kg 54.6 kg    Intake/Output: I/O last 3 completed shifts: In: 100 [IV Piggyback:100] Out: 2450 [Urine:2450]   Intake/Output this shift:  Total I/O In: -  Out: 250 [Urine:250]  Physical Exam: General: Cachectic appearing male  Head: Normocephalic, atraumatic. Moist oral mucosal membranes  Eyes: Anicteric  Lungs:  Mild exp wheeze, normal effort  Heart: S1S2 no rubs  Abdomen:  Soft, nontender, bowel sounds present  Extremities: No peripheral edema.  Neurologic: Awake, alert, following commands  Skin: No acute rash  Access: No hemodialysis access    Basic Metabolic Panel: Recent Labs  Lab 06/23/21 0521 06/23/21 1158 06/23/21 1545 06/24/21 0435 06/24/21 1439 06/25/21 0328 06/25/21 1937 06/26/21 0409  NA 154*  --    < > 153* 148* 146* 146* 147*  K 3.5  --   --  3.6  --  3.7 3.5 3.7  CL 119*  --   --  116*  --  111 104 107  CO2 28  --   --  31  --  30 32 30  GLUCOSE 390* 461*  --  135*  --  283* 143* 109*  BUN 38*  --   --  30*  --  30* 23 27*  CREATININE 1.11  --   --  0.99  --  1.03 0.95 0.99  CALCIUM 9.1  --   --  9.0  --  8.5* 9.0 8.9  MG 2.6*  --   --  2.4  --   --  2.4 2.5*  PHOS 2.4*  --   --  3.3  --   --   --   --    < >  = values in this interval not displayed.     Liver Function Tests: Recent Labs  Lab 06/21/21 1244 06/26/21 0409  AST 28 73*  ALT 114* 87*  ALKPHOS 101 109  BILITOT 0.8 1.0  PROT 8.8* 7.3  ALBUMIN 3.4* 2.4*    No results for input(s): LIPASE, AMYLASE in the last 168 hours. No results for input(s): AMMONIA in the last 168 hours.  CBC: Recent Labs  Lab 06/21/21 1244 06/22/21 0545 06/23/21 0521 06/25/21 1937 06/26/21 0409  WBC 11.4* 9.8 11.4* 14.7* 17.1*  NEUTROABS  --   --   --  13.5* 14.9*  HGB 13.9 14.1 12.1* 13.0 12.5*  HCT 44.8 46.0 39.2 41.6 39.6  MCV 83.4 83.8 84.8 82.1 81.1  PLT 243 227 192 168 169     Cardiac Enzymes: No results for input(s): CKTOTAL, CKMB, CKMBINDEX, TROPONINI in the last 168 hours.  BNP: Invalid input(s): POCBNP  CBG: Recent Labs  Lab 06/25/21 1837 06/25/21 1907 06/25/21 2326 06/26/21 0322 06/26/21 0962  GLUCAP 152* Louis Stone     Microbiology: Results for orders placed or performed during the hospital encounter of 06/21/21  Resp Panel by RT-PCR (Flu A&B, Covid) Nasopharyngeal Swab     Status: None   Collection Time: 06/21/21  2:58 PM   Specimen: Nasopharyngeal Swab; Nasopharyngeal(NP) swabs in vial transport medium  Result Value Ref Range Status   SARS Coronavirus 2 by RT PCR NEGATIVE NEGATIVE Final    Comment: (NOTE) SARS-CoV-2 target nucleic acids are NOT DETECTED.  The SARS-CoV-2 RNA is generally detectable in upper respiratory specimens during the acute phase of infection. The lowest concentration of SARS-CoV-2 viral copies this assay can detect is 138 copies/mL. A negative result does not preclude SARS-Cov-2 infection and should not be used as the sole basis for treatment or other patient management decisions. A negative result may occur with  improper specimen collection/handling, submission of specimen other than nasopharyngeal swab, presence of viral mutation(s) within the areas targeted by this assay,  and inadequate number of viral copies(<138 copies/mL). A negative result must be combined with clinical observations, patient history, and epidemiological information. The expected result is Negative.  Fact Sheet for Patients:  EntrepreneurPulse.com.au  Fact Sheet for Healthcare Providers:  IncredibleEmployment.be  This test is no t yet approved or cleared by the [place] FDA and  has been authorized for detection and/or diagnosis of SARS-CoV-2 by FDA under an Emergency Use Authorization (EUA). This EUA will remain  in effect (meaning this test can be used) for the duration of the COVID-19 declaration under Section 564(b)(1) of the Act, 21 U.S.C.section 360bbb-3(b)(1), unless the authorization is terminated  or revoked sooner.       Influenza A by PCR NEGATIVE NEGATIVE Final   Influenza B by PCR NEGATIVE NEGATIVE Final    Comment: (NOTE) The Xpert Xpress SARS-CoV-2/FLU/RSV plus assay is intended as an aid in the diagnosis of influenza from Nasopharyngeal swab specimens and should not be used as a sole basis for treatment. Nasal washings and aspirates are unacceptable for Xpert Xpress SARS-CoV-2/FLU/RSV testing.  Fact Sheet for Patients: EntrepreneurPulse.com.au  Fact Sheet for Healthcare Providers: IncredibleEmployment.be  This test is not yet approved or cleared by the [place] FDA and has been authorized for detection and/or diagnosis of SARS-CoV-2 by FDA under an Emergency Use Authorization (EUA). This EUA will remain in effect (meaning this test can be used) for the duration of the COVID-19 declaration under Section 564(b)(1) of the Act, 21 U.S.C. section 360bbb-3(b)(1), unless the authorization is terminated or revoked.  Performed at [facility], [address] [place], [place] [address]   Culture, blood (Routine X 2) w Reflex to ID Panel     Status: None   Collection Time:  06/21/21  2:58 PM   Specimen: BLOOD  Result Value Ref Range Status   Specimen Description BLOOD BLOOD RIGHT FOREARM  Final   Special Requests   Final    BOTTLES DRAWN AEROBIC AND ANAEROBIC Blood Culture results may not be optimal due to an inadequate volume of blood received in culture bottles   Culture   Final    NO GROWTH 5 DAYS Performed at [facility], [address], [place], [place] [address]    Report Status 06/26/2021 FINAL  Final  Culture, blood (Routine X 2) w Reflex to ID Panel     Status: Abnormal (Preliminary result)   Collection Time: 06/21/21  2:58 PM   Specimen: BLOOD  Result Value Ref Range Status  Specimen Description   Final    BLOOD LEFT ANTECUBITAL Performed at [facility], [address] [place], [place] [address]    Special Requests   Final    BOTTLES DRAWN AEROBIC AND ANAEROBIC Blood Culture results may not be optimal due to an inadequate volume of blood received in culture bottles Performed at [facility], [address] [place], [place] [address]    Culture  Setup Time   Final    GRAM POSITIVE COCCI IN BOTH AEROBIC AND ANAEROBIC BOTTLES CRITICAL RESULT CALLED TO, READ BACK BY AND VERIFIED WITHOtto Herb PHARMD [id number] 06/22/21 HNM    Culture (A)  Final    STAPHYLOCOCCUS HOMINIS STAPHYLOCOCCUS HAEMOLYTICUS CULTURE REINCUBATED FOR BETTER GROWTH Performed at [facility], [place] [address], [place], [place] [address]    Report Status PENDING  Incomplete  Blood Culture ID Panel (Reflexed)     Status: Abnormal   Collection Time: 06/21/21  2:58 PM  Result Value Ref Range Status   Enterococcus faecalis NOT DETECTED NOT DETECTED Final   Enterococcus Faecium NOT DETECTED NOT DETECTED Final   Listeria monocytogenes NOT DETECTED NOT DETECTED Final   Staphylococcus species DETECTED (A) NOT DETECTED Final    Comment: CRITICAL RESULT CALLED TO, READ BACK BY AND VERIFIED WITH: Otto Herb PHARMD [id number] 06/22/21 HNM     Staphylococcus aureus (BCID) NOT DETECTED NOT DETECTED Final   Staphylococcus epidermidis NOT DETECTED NOT DETECTED Final   Staphylococcus lugdunensis NOT DETECTED NOT DETECTED Final   Streptococcus species NOT DETECTED NOT DETECTED Final   Streptococcus agalactiae NOT DETECTED NOT DETECTED Final   Streptococcus pneumoniae NOT DETECTED NOT DETECTED Final   Streptococcus pyogenes NOT DETECTED NOT DETECTED Final   A.calcoaceticus-baumannii NOT DETECTED NOT DETECTED Final   Bacteroides fragilis NOT DETECTED NOT DETECTED Final   Enterobacterales NOT DETECTED NOT DETECTED Final   Enterobacter cloacae complex NOT DETECTED NOT DETECTED Final   Escherichia coli NOT DETECTED NOT DETECTED Final   Klebsiella aerogenes NOT DETECTED NOT DETECTED Final   Klebsiella oxytoca NOT DETECTED NOT DETECTED Final   Klebsiella pneumoniae NOT DETECTED NOT DETECTED Final   Proteus species NOT DETECTED NOT DETECTED Final   Salmonella species NOT DETECTED NOT DETECTED Final   Serratia marcescens NOT DETECTED NOT DETECTED Final   Haemophilus influenzae NOT DETECTED NOT DETECTED Final   Neisseria meningitidis NOT DETECTED NOT DETECTED Final   Pseudomonas aeruginosa NOT DETECTED NOT DETECTED Final   Stenotrophomonas maltophilia NOT DETECTED NOT DETECTED Final   Candida albicans NOT DETECTED NOT DETECTED Final   Candida auris NOT DETECTED NOT DETECTED Final   Candida glabrata NOT DETECTED NOT DETECTED Final   Candida krusei NOT DETECTED NOT DETECTED Final   Candida parapsilosis NOT DETECTED NOT DETECTED Final   Candida tropicalis NOT DETECTED NOT DETECTED Final   Cryptococcus neoformans/gattii NOT DETECTED NOT DETECTED Final    Comment: Performed at [facility], [address] [place], [place] [address]  MRSA Next Gen by PCR, Nasal     Status: None   Collection Time: 06/21/21  6:25 PM   Specimen: Nasal Mucosa; Nasal Swab  Result Value Ref Range Status   MRSA by PCR Next Gen NOT DETECTED NOT DETECTED  Final    Comment: (NOTE) The GeneXpert MRSA Assay (FDA approved for NASAL specimens only), is one component of a comprehensive MRSA colonization surveillance program. It is not intended to diagnose MRSA infection nor to guide or monitor treatment for MRSA infections. Test performance is  not FDA approved in patients less than 31 years old. Performed at [facility], [address] [place], [place] [address]   CULTURE, BLOOD (ROUTINE X 2) w Reflex to ID Panel     Status: None (Preliminary result)   Collection Time: 06/25/21  8:05 PM   Specimen: BLOOD  Result Value Ref Range Status   Specimen Description BLOOD LEFT ANTECUBITAL  Final   Special Requests   Final    BOTTLES DRAWN AEROBIC AND ANAEROBIC Blood Culture adequate volume   Culture   Final    NO GROWTH < 12 HOURS Performed at [facility], [address] [place], [place] [address]    Report Status PENDING  Incomplete  CULTURE, BLOOD (ROUTINE X 2) w Reflex to ID Panel     Status: None (Preliminary result)   Collection Time: 06/25/21  8:08 PM   Specimen: BLOOD  Result Value Ref Range Status   Specimen Description BLOOD BLOOD LEFT HAND  Final   Special Requests   Final    BOTTLES DRAWN AEROBIC AND ANAEROBIC Blood Culture adequate volume   Culture   Final    NO GROWTH < 12 HOURS Performed at [facility], [address] [place], [place] [address]    Report Status PENDING  Incomplete    Coagulation Studies: No results for input(s): LABPROT, INR in the last 72 hours.  Urinalysis: Recent Labs    06/25/21 2052  COLORURINE YELLOW  LABSPEC 1.015  PHURINE 5.0  GLUCOSEU NEGATIVE  HGBUR MODERATE*  BILIRUBINUR NEGATIVE  KETONESUR NEGATIVE  PROTEINUR NEGATIVE  NITRITE NEGATIVE  LEUKOCYTESUR NEGATIVE       Imaging: DG Chest Port 1 View  Result Date: 06/25/2021 CLINICAL DATA:  Pneumonia.  History of diabetes. EXAM: PORTABLE CHEST 1 VIEW COMPARISON:  CT 06/22/2021.  Radiographs 06/21/2021  and 06/05/2021. FINDINGS: 1417 hours. The heart size and mediastinal contours are stable. Extensive lower lobe predominant airspace opacities are again noted, right greater than left. These have slightly worsened compared with the most recent radiographs, but are similar to the CT of 3 days ago. No evidence of pneumothorax or significant pleural effusion. Telemetry leads overlie the chest. IMPRESSION: No significant change in multilobar bronchopneumonia compared with CT of 3 days ago. No significant pleural effusion. Electronically Signed   By: Richardean Sale M.D.   On: 06/25/2021 14:33     Medications:    ceFEPime (MAXIPIME) IV 2 g (06/26/21 1008)   vancomycin      acetylcysteine  1 mL Nebulization Once   budesonide (PULMICORT) nebulizer solution  0.25 mg Nebulization BID   Chlorhexidine Gluconate Cloth  6 each Topical Q0600   enoxaparin (LOVENOX) injection  40 mg Subcutaneous QHS   famotidine  20 mg Per Tube Daily   insulin aspart  0-15 Units Subcutaneous Q4H   insulin aspart  0-5 Units Subcutaneous QHS   insulin aspart  5 Units Subcutaneous Q4H   insulin glargine-yfgn  25 Units Subcutaneous Daily   methylPREDNISolone (SOLU-MEDROL) injection  20 mg Intravenous Q12H   metoprolol succinate  12.5 mg Oral Daily   acetaminophen (TYLENOL) oral liquid 160 mg/5 mL, albuterol, guaiFENesin, metoprolol tartrate, ondansetron (ZOFRAN) IV  Assessment/ Plan:  70 y.o. male with a PMHx of oropharyngeal cancer status post G-tube placement, diabetes mellitus type 2, hyperlipidemia, GERD, former tobacco abuse, who was admitted to [facility] on 06/21/2021 for evaluation of cough as well as shortness of breath.     1.  Severe hypernatremia.  Significantly improved with  a combination of D5W initially.  This did cause hypoglycemia.  We subsequently transition the patient to quarter normal saline.  Serum sodium now 147.  Patient was on 1/4 normal saline but now taken off.  May need to restart if sodium rises.  2.   Acute kidney injury.  Creatinine currently 0.9 but BUN slightly higher today at 27.  Continue to monitor.  3.  Acute respiratory failure secondary to pneumonia.  Patient with worsening respiratory status yesterday.  Was initially on BiPAP but now transition down to high flow nasal cannula.  Patient currently on cefepime and steroids.   LOS: 5 Wynonia Medero 12/23/202211:12 AM

## 2021-06-26 NOTE — Progress Notes (Signed)
Nutrition Follow Up Note   DOCUMENTATION CODES:   Severe malnutrition in context of chronic illness  INTERVENTION:   Initiate Osmolite 1.5@60ml /hr   Free water flushes 128m q4 hours   Regimen provides 2160kcal/day, 90g/day protein and 16976mday free water.   Pt at high refeed risk; recommend monitor potassium, magnesium and phosphorus labs daily until stable  NUTRITION DIAGNOSIS:   Severe Malnutrition related to cancer and cancer related treatments as evidenced by severe muscle depletion, severe fat depletion, percent weight loss.  GOAL:   Patient will meet greater than or equal to 90% of their needs -not met   MONITOR:   Labs, Weight trends, TF tolerance, Skin, I & O's  ASSESSMENT:   [age].o. 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 Pt now on HFNC. Will resume tube feeds today. Will provide continuous, low fiber formula to reduce aspiration risk. Pt is likely at refeed risk. Pt with hypernatremia today; will add free water flushes. Per chart, pt is weight stable since admission. Pt +1.3L on his I & Os.   Medications reviewed and include: lovenox, pepcid, insulin, solu-medrol, cefepime, vancomycin   Labs reviewed: Na 147(H), K 3.7 wnl, BUN 27(H), Mg 2.5(H) Wbc- 17.1(H) Cbgs- 184, 126, 95 x 24 hrs  Diet Order:   Diet Order             Diet NPO time specified  Diet effective now                  EDUCATION NEEDS:   Education needs have been addressed  Skin:  Skin Assessment: Reviewed RN Assessment (ecchymosis)  Last BM:  12/23- type 6  Height:   Ht Readings from Last 1 Encounters:  06/25/21 5' 5"  (1.651 m)    Weight:   Wt Readings from Last 1 Encounters:  06/25/21 54.6 kg    Ideal Body Weight:  61.8 kg  BMI:  Body mass  index is 20.03 kg/m.  Estimated Nutritional Needs:   Kcal:  1800-2100kcal/day  Protein:  90-105  Fluid:  1.5-1.7L/day  CaKoleen DistanceS, RD, LDN Please refer to [facility] and/or RD on-call/weekend/after hours pager

## 2021-06-27 ENCOUNTER — Inpatient Hospital Stay: Payer: Medicare Other

## 2021-06-27 DIAGNOSIS — N179 Acute kidney failure, unspecified: Secondary | ICD-10-CM | POA: Diagnosis not present

## 2021-06-27 DIAGNOSIS — J9621 Acute and chronic respiratory failure with hypoxia: Secondary | ICD-10-CM

## 2021-06-27 DIAGNOSIS — G9341 Metabolic encephalopathy: Secondary | ICD-10-CM | POA: Diagnosis not present

## 2021-06-27 DIAGNOSIS — J9601 Acute respiratory failure with hypoxia: Secondary | ICD-10-CM | POA: Diagnosis not present

## 2021-06-27 DIAGNOSIS — J690 Pneumonitis due to inhalation of food and vomit: Secondary | ICD-10-CM | POA: Diagnosis not present

## 2021-06-27 LAB — GLUCOSE, CAPILLARY
Glucose-Capillary: 107 mg/dL — ABNORMAL HIGH (ref 70–99)
Glucose-Capillary: 121 mg/dL — ABNORMAL HIGH (ref 70–99)
Glucose-Capillary: 131 mg/dL — ABNORMAL HIGH (ref 70–99)
Glucose-Capillary: 134 mg/dL — ABNORMAL HIGH (ref 70–99)
Glucose-Capillary: 135 mg/dL — ABNORMAL HIGH (ref 70–99)
Glucose-Capillary: 168 mg/dL — ABNORMAL HIGH (ref 70–99)
Glucose-Capillary: 173 mg/dL — ABNORMAL HIGH (ref 70–99)
Glucose-Capillary: 241 mg/dL — ABNORMAL HIGH (ref 70–99)
Glucose-Capillary: 247 mg/dL — ABNORMAL HIGH (ref 70–99)
Glucose-Capillary: 254 mg/dL — ABNORMAL HIGH (ref 70–99)
Glucose-Capillary: 289 mg/dL — ABNORMAL HIGH (ref 70–99)
Glucose-Capillary: 344 mg/dL — ABNORMAL HIGH (ref 70–99)
Glucose-Capillary: 345 mg/dL — ABNORMAL HIGH (ref 70–99)
Glucose-Capillary: 368 mg/dL — ABNORMAL HIGH (ref 70–99)
Glucose-Capillary: 74 mg/dL (ref 70–99)
Glucose-Capillary: 75 mg/dL (ref 70–99)
Glucose-Capillary: 87 mg/dL (ref 70–99)

## 2021-06-27 LAB — BLOOD GAS, ARTERIAL
Acid-Base Excess: 8.9 mmol/L — ABNORMAL HIGH (ref 0.0–2.0)
Bicarbonate: 34.6 mmol/L — ABNORMAL HIGH (ref 20.0–28.0)
FIO2: 0.8
O2 Saturation: 87.1 %
Patient temperature: 37
pCO2 arterial: 51 mmHg — ABNORMAL HIGH (ref 32.0–48.0)
pH, Arterial: 7.44 (ref 7.350–7.450)
pO2, Arterial: 51 mmHg — ABNORMAL LOW (ref 83.0–108.0)

## 2021-06-27 LAB — SODIUM: Sodium: 146 mmol/L — ABNORMAL HIGH (ref 135–145)

## 2021-06-27 LAB — COMPREHENSIVE METABOLIC PANEL
ALT: 293 U/L — ABNORMAL HIGH (ref 0–44)
AST: 391 U/L — ABNORMAL HIGH (ref 15–41)
Albumin: 2 g/dL — ABNORMAL LOW (ref 3.5–5.0)
Alkaline Phosphatase: 126 U/L (ref 38–126)
Anion gap: 8 (ref 5–15)
BUN: 48 mg/dL — ABNORMAL HIGH (ref 8–23)
CO2: 31 mmol/L (ref 22–32)
Calcium: 9.3 mg/dL (ref 8.9–10.3)
Chloride: 109 mmol/L (ref 98–111)
Creatinine, Ser: 0.94 mg/dL (ref 0.61–1.24)
GFR, Estimated: >60 mL/min (ref >60)
Glucose, Bld: 200 mg/dL — ABNORMAL HIGH (ref 70–99)
Potassium: 3.1 mmol/L — ABNORMAL LOW (ref 3.5–5.1)
Sodium: 148 mmol/L — ABNORMAL HIGH (ref 135–145)
Total Bilirubin: 0.6 mg/dL (ref 0.3–1.2)
Total Protein: 7.1 g/dL (ref 6.5–8.1)

## 2021-06-27 LAB — CBC WITH DIFFERENTIAL/PLATELET
Abs Immature Granulocytes: 0.19 10*3/uL — ABNORMAL HIGH (ref 0.00–0.07)
Basophils Absolute: 0 10*3/uL (ref 0.0–0.1)
Basophils Relative: 0 %
Eosinophils Absolute: 0 10*3/uL (ref 0.0–0.5)
Eosinophils Relative: 0 %
HCT: 38.8 % — ABNORMAL LOW (ref 39.0–52.0)
Hemoglobin: 12.2 g/dL — ABNORMAL LOW (ref 13.0–17.0)
Immature Granulocytes: 1 %
Lymphocytes Relative: 2 %
Lymphs Abs: 0.5 10*3/uL — ABNORMAL LOW (ref 0.7–4.0)
MCH: 25.7 pg — ABNORMAL LOW (ref 26.0–34.0)
MCHC: 31.4 g/dL (ref 30.0–36.0)
MCV: 81.7 fL (ref 80.0–100.0)
Monocytes Absolute: 0.6 10*3/uL (ref 0.1–1.0)
Monocytes Relative: 3 %
Neutro Abs: 18.2 10*3/uL — ABNORMAL HIGH (ref 1.7–7.7)
Neutrophils Relative %: 94 %
Platelets: 216 10*3/uL (ref 150–400)
RBC: 4.75 MIL/uL (ref 4.22–5.81)
RDW: 14.6 % (ref 11.5–15.5)
WBC: 19.5 10*3/uL — ABNORMAL HIGH (ref 4.0–10.5)
nRBC: 0 % (ref 0.0–0.2)

## 2021-06-27 LAB — HEPATITIS PANEL, ACUTE
HCV Ab: NONREACTIVE
Hep A IgM: NONREACTIVE
Hep B C IgM: NONREACTIVE
Hepatitis B Surface Ag: NONREACTIVE

## 2021-06-27 LAB — PROTIME-INR
INR: 1.1 (ref 0.8–1.2)
Prothrombin Time: 13.9 seconds (ref 11.4–15.2)

## 2021-06-27 LAB — URINE CULTURE: Culture: NO GROWTH

## 2021-06-27 LAB — PROCALCITONIN: Procalcitonin: 14.18 ng/mL

## 2021-06-27 LAB — BRAIN NATRIURETIC PEPTIDE: B Natriuretic Peptide: 83.9 pg/mL (ref 0.0–100.0)

## 2021-06-27 LAB — PHOSPHORUS: Phosphorus: 2.4 mg/dL — ABNORMAL LOW (ref 2.5–4.6)

## 2021-06-27 LAB — MAGNESIUM: Magnesium: 2.9 mg/dL — ABNORMAL HIGH (ref 1.7–2.4)

## 2021-06-27 LAB — CK: Total CK: 77 U/L (ref 49–397)

## 2021-06-27 MED ORDER — INSULIN ASPART 100 UNIT/ML IJ SOLN
5.0000 [IU] | Freq: Once | INTRAMUSCULAR | Status: AC
  Administered 2021-06-27: 22:00:00 5 [IU] via SUBCUTANEOUS
  Filled 2021-06-27: qty 1

## 2021-06-27 MED ORDER — ARFORMOTEROL TARTRATE 15 MCG/2ML IN NEBU
15.0000 ug | INHALATION_SOLUTION | Freq: Two times a day (BID) | RESPIRATORY_TRACT | Status: DC
  Administered 2021-06-28 – 2021-07-05 (×14): 15 ug via RESPIRATORY_TRACT
  Filled 2021-06-27 (×19): qty 2

## 2021-06-27 MED ORDER — FUROSEMIDE 10 MG/ML IJ SOLN
20.0000 mg | Freq: Once | INTRAMUSCULAR | Status: AC
  Administered 2021-06-27: 10:00:00 20 mg via INTRAVENOUS
  Filled 2021-06-27: qty 2

## 2021-06-27 MED ORDER — PIPERACILLIN-TAZOBACTAM 3.375 G IVPB
3.3750 g | Freq: Three times a day (TID) | INTRAVENOUS | Status: AC
  Administered 2021-06-27 – 2021-07-04 (×20): 3.375 g via INTRAVENOUS
  Filled 2021-06-27 (×20): qty 50

## 2021-06-27 MED ORDER — INSULIN DETEMIR 100 UNIT/ML ~~LOC~~ SOLN
10.0000 [IU] | Freq: Two times a day (BID) | SUBCUTANEOUS | Status: DC
  Administered 2021-06-27: 10:00:00 10 [IU] via SUBCUTANEOUS
  Filled 2021-06-27 (×3): qty 0.1

## 2021-06-27 MED ORDER — INSULIN ASPART 100 UNIT/ML IJ SOLN
0.0000 [IU] | INTRAMUSCULAR | Status: DC
  Administered 2021-06-27: 7 [IU] via SUBCUTANEOUS
  Administered 2021-06-28 (×2): 4 [IU] via SUBCUTANEOUS
  Administered 2021-06-28: 20:00:00 3 [IU] via SUBCUTANEOUS
  Administered 2021-06-28: 13:00:00 7 [IU] via SUBCUTANEOUS
  Administered 2021-06-28 – 2021-06-29 (×3): 4 [IU] via SUBCUTANEOUS
  Administered 2021-06-29: 04:00:00 3 [IU] via SUBCUTANEOUS
  Administered 2021-06-29: 20:00:00 4 [IU] via SUBCUTANEOUS
  Administered 2021-06-30 (×3): 3 [IU] via SUBCUTANEOUS
  Administered 2021-06-30 (×2): 4 [IU] via SUBCUTANEOUS
  Administered 2021-07-01: 09:00:00 3 [IU] via SUBCUTANEOUS
  Administered 2021-07-01 (×2): 7 [IU] via SUBCUTANEOUS
  Administered 2021-07-01: 01:00:00 4 [IU] via SUBCUTANEOUS
  Administered 2021-07-01: 13:00:00 3 [IU] via SUBCUTANEOUS
  Administered 2021-07-02: 12:00:00 7 [IU] via SUBCUTANEOUS
  Administered 2021-07-02 (×4): 4 [IU] via SUBCUTANEOUS
  Administered 2021-07-02 – 2021-07-03 (×2): 7 [IU] via SUBCUTANEOUS
  Administered 2021-07-03: 13:00:00 11 [IU] via SUBCUTANEOUS
  Administered 2021-07-03 (×2): 7 [IU] via SUBCUTANEOUS
  Administered 2021-07-03: 05:00:00 3 [IU] via SUBCUTANEOUS
  Administered 2021-07-03 – 2021-07-04 (×3): 7 [IU] via SUBCUTANEOUS
  Administered 2021-07-04: 09:00:00 11 [IU] via SUBCUTANEOUS
  Administered 2021-07-05: 3 [IU] via SUBCUTANEOUS
  Administered 2021-07-05 (×2): 7 [IU] via SUBCUTANEOUS
  Filled 2021-06-27 (×37): qty 1

## 2021-06-27 MED ORDER — INSULIN DETEMIR 100 UNIT/ML ~~LOC~~ SOLN
15.0000 [IU] | Freq: Two times a day (BID) | SUBCUTANEOUS | Status: DC
  Administered 2021-06-27 – 2021-07-01 (×8): 15 [IU] via SUBCUTANEOUS
  Filled 2021-06-27 (×11): qty 0.15

## 2021-06-27 MED ORDER — DEXTROSE 10 % IV SOLN: INTRAVENOUS | Status: DC | PRN

## 2021-06-27 MED ORDER — ORAL CARE MOUTH RINSE
15.0000 mL | Freq: Two times a day (BID) | OROMUCOSAL | Status: DC
  Administered 2021-06-27 – 2021-07-05 (×12): 15 mL via OROMUCOSAL

## 2021-06-27 MED ORDER — FREE WATER
150.0000 mL | Status: DC
  Administered 2021-06-27 – 2021-07-02 (×29): 150 mL

## 2021-06-27 MED ORDER — INSULIN ASPART 100 UNIT/ML IJ SOLN
3.0000 [IU] | INTRAMUSCULAR | Status: DC
Start: 2021-06-27 — End: 2021-06-27
  Administered 2021-06-27: 16:00:00 6 [IU] via SUBCUTANEOUS
  Administered 2021-06-27: 20:00:00 9 [IU] via SUBCUTANEOUS
  Administered 2021-06-27: 12:00:00 3 [IU] via SUBCUTANEOUS
  Filled 2021-06-27 (×2): qty 1

## 2021-06-27 MED ORDER — POTASSIUM CHLORIDE 20 MEQ PO PACK
40.0000 meq | PACK | Freq: Once | ORAL | Status: AC
  Administered 2021-06-27: 08:00:00 40 meq
  Filled 2021-06-27: qty 2

## 2021-06-27 MED ORDER — POTASSIUM CHLORIDE 10 MEQ/100ML IV SOLN
10.0000 meq | INTRAVENOUS | Status: AC
  Administered 2021-06-27 (×4): 10 meq via INTRAVENOUS
  Filled 2021-06-27 (×4): qty 100

## 2021-06-27 MED ORDER — REVEFENACIN 175 MCG/3ML IN SOLN
175.0000 ug | Freq: Every day | RESPIRATORY_TRACT | Status: DC
  Administered 2021-06-28 – 2021-07-05 (×8): 175 ug via RESPIRATORY_TRACT
  Filled 2021-06-27 (×10): qty 3

## 2021-06-27 MED ORDER — INSULIN ASPART 100 UNIT/ML IJ SOLN
3.0000 [IU] | INTRAMUSCULAR | Status: DC
  Administered 2021-06-27 – 2021-07-01 (×21): 3 [IU] via SUBCUTANEOUS
  Filled 2021-06-27 (×16): qty 1

## 2021-06-27 MED ORDER — CHLORHEXIDINE GLUCONATE 0.12 % MT SOLN
15.0000 mL | Freq: Two times a day (BID) | OROMUCOSAL | Status: DC
  Administered 2021-06-27 – 2021-07-05 (×17): 15 mL via OROMUCOSAL
  Filled 2021-06-27 (×17): qty 15

## 2021-06-27 NOTE — Progress Notes (Signed)
[facility]) Hospital Liaison Note  Notified by Loletha Grayer, LCSW [facility] manager of patient/family request for [facility] Palliative services at home after discharge.  [facility] hospital liaison will follow patient for discharge disposition.  Please call with any hospice or outpatient palliative care related questions.  Thank you for the opportunity to participate in this patient's care.  Nadene Rubins, RN, BSN [place] [phone]

## 2021-06-27 NOTE — TOC Progression Note (Signed)
Transition of Care [facility]) - Progression Note    Patient Details  Name: Louis Stone MRN: [id number] Date of Birth: 01-08-1951  Transition of Care [facility]) CM/SW Contact  Eileen Stanford, LCSW Phone Number: 06/27/2021, 9:25 AM  Clinical Narrative:   Outpatient palliative referral sent by [facility] with [facility].         Expected Discharge Plan and Services                                                 Social Determinants of Health (SDOH) Interventions    Readmission Risk Interventions Readmission Risk Prevention Plan 02/03/2021  Transportation Screening Complete  PCP or Specialist Appt within 3-5 Days Complete  HRI or [place] Complete  Social Work Consult for [place] Planning/Counseling Complete  Palliative Care Screening Not Applicable  Medication Review Press photographer) Complete  Some recent data might be hidden

## 2021-06-27 NOTE — Progress Notes (Signed)
NAME:  Louis Stone, MRN:  [id number], DOB:  1950-12-28, LOS: 6 ADMISSION DATE:  06/21/2021, INITIAL CONSULTATION DATE:  06/22/2021, RE CONSULT DATE: 06/25/21 REFERRING MD:  Nita Sells, MD, CHIEF COMPLAINT:  Hypoxia   Brief Patient Description  70yo male w/ a past medical history of oropharynx cancer s/p G-tube placement, Type II diabetes, HLD, GERD and former smoker who was admitted to hospitalist service with acute hypoxic respiratory failure secondary to suspected aspiration pneumonia.   ED COURSE: Per the medical record, the patient has been having these symptoms for a few days. Denies fevers. ED workup revealed: elevated glucose of 500, WBC 11.4, Cr 1.58, Na 153, chest xray which revealed right lower lobe infiltrate concerning for aspiration pneumonia and initially hypoxic w/sats of 88% that improved with 2L [place]. The patients received 1L IVFs, unasyn and was started on IVFs at 75cc/hr. Cultures sent. COVID and Flu negative. PCCM consulted on 12/19 due to worsening hypoxia  HOSPITAL COURSE: CTA  was obtained  which was negative for PE but showed pneumoperitoneum.  Surgery consulted, free air felt to be due to PEG tube.  Nephrology consulted for AKI and Hypernatremia 12/20: Weaned to 3L [place].  AKI and Hypernatremia improving. Patient transferred to [facility] unit. On 12/21, patient was requiring less oxygen and was transitioned from Unasyn to Augmentin. On 12/22, patient transferred to PCU on 13L HFNC, however decompensated further with increased oxygen needs requiring transfer to stepdown. PCCM re-consulted due to high risk for decompensation  Pertinent  Medical History  Type II Diabetes Oropharynx cancer s/p Gtube placement Hyperlipidemia Hypertension  Significant Hospital Events: Including procedures, antibiotic start and stop dates in addition to other pertinent events   12/19: Admitted. Optiflow. CTA negative for PE, did show pneumoperitoneum.  Surgery consulted, free air felt  to be due to PEG tube.  Nephrology consulted for AKI and Hypernatremia 12/20: Weaned to 3L [place].  AKI and Hypernatremia improving. Patient transferred to [facility] unit 12/21: Requiring less oxygen on [place]. Transitioned from Unasyn to Augmentin 12/22: Patient transferred to PCU on 13L HFNC, however decompensated further with increased oxygen needs requiring transfer to stepdown. PCCM re-consulted due to high risk for decompensation. 12/24: On high flow O2, MetaNeb for pulmonary hygiene   Microbiology Results:  12/18: SARS-CoV-2 PCR>> negative 12/18: Influenza PCR>> negative 06/21/21: Strep pneumo urinary antigen>>negative 06/21/21: Blood culture>> 1/2 with Staph Hominis + Haemolyticus  12/18: MRSA PCR>> negative 12/22: Blood culture x2>>No growth thus far 12/22: Urine Culture>>no growth   Antimicrobials:  Unasyn 12/18 >> stop 12/22 Augmentin 12/21 >> 12/22 Cefepime 12/22>> Vancomycin 12/22>> Interim History / Subjective:  -Afebrile, hemodynamically stable -Remains on HFNC but no increased work of breathing. -Labs, ABGs and Xray obtained -3 L positive since admit, diuresed  OBJECTIVE   Blood pressure (!) 149/95, pulse 82, temperature 99.1 F (37.3 C), temperature source Oral, resp. rate (!) 21, height 5\' 5"  (1.651 m), weight 54.6 kg, SpO2 94 %.    FiO2 (%):  [35 %-60 %] 35 %   Intake/Output Summary (Last 24 hours) at 06/27/2021 0942 Last data filed at 06/27/2021 0900 Gross per 24 hour  Intake 2530.16 ml  Output 1100 ml  Net 1430.16 ml    Filed Weights   06/21/21 1239 06/22/21 0331 06/25/21 1857  Weight: 54.4 kg 48.2 kg 54.6 kg   Physical Examination: GENERAL: Chronically ill-appearing gentleman, cachectic, no respiratory distress on HFNC + EYES: Pupils equal, round, reactive to light and accommodation. No scleral icterus.  HEENT: Head atraumatic, normocephalic. Oropharynx  and nasopharynx clear.  Temporal wasting. NECK:  Supple, no jugular venous distention.  Trachea  midline. LUNGS: Decreased  breath sounds bilaterally, no wheezing, rales/crackles right base. No use of accessory muscles of respiration.  CARDIOVASCULAR: S1, S2 normal. No murmurs, rubs, or gallops.  ABDOMEN: Soft, nontender, nondistended. Bowel sounds present. No organomegaly or mass. PEG+ EXTREMITIES: No pedal edema, cyanosis, or clubbing.  NEUROLOGIC: No overt focal deficit. PSYCHIATRIC: The patient is alert and oriented x 3.  SKIN: No obvious rash, lesion, or ulcer.    Labs   CBC: Recent Labs  Lab 06/22/21 0545 06/23/21 0521 06/25/21 1937 06/26/21 0409 06/27/21 0417  WBC 9.8 11.4* 14.7* 17.1* 19.5*  NEUTROABS  --   --  13.5* 14.9* 18.2*  HGB 14.1 12.1* 13.0 12.5* 12.2*  HCT 46.0 39.2 41.6 39.6 38.8*  MCV 83.8 84.8 82.1 81.1 81.7  PLT 227 192 168 169 216     Basic Metabolic Panel: Recent Labs  Lab 06/23/21 0521 06/23/21 1158 06/24/21 0435 06/24/21 1439 06/25/21 0328 06/25/21 1937 06/26/21 0409 06/27/21 0417  NA 154*   < > 153* 148* 146* 146* 147* 148*  K 3.5  --  3.6  --  3.7 3.5 3.7 3.1*  CL 119*  --  116*  --  111 104 107 109  CO2 28  --  31  --  30 32 30 31  GLUCOSE 390*   < > 135*  --  283* 143* 109* 200*  BUN 38*  --  30*  --  30* 23 27* 48*  CREATININE 1.11  --  0.99  --  1.03 0.95 0.99 0.94  CALCIUM 9.1  --  9.0  --  8.5* 9.0 8.9 9.3  MG 2.6*  --  2.4  --   --  2.4 2.5* 2.9*  PHOS 2.4*  --  3.3  --   --   --   --  2.4*   < > = values in this interval not displayed.    GFR: Estimated Creatinine Clearance: 57.3 mL/min (by C-G formula based on SCr of 0.94 mg/dL). Recent Labs  Lab 06/21/21 1244 06/21/21 1458 06/21/21 1824 06/22/21 0545 06/23/21 0521 06/25/21 1937 06/26/21 0409 06/27/21 0417  PROCALCITON 5.35  --   --   --   --  3.21 15.54 14.18  WBC 11.4*  --   --    < > 11.4* 14.7* 17.1* 19.5*  LATICACIDVEN  --  2.2* 1.8  --   --  2.7*  --   --    < > = values in this interval not displayed.     Liver Function Tests: Recent Labs  Lab  06/21/21 1244 06/26/21 0409 06/27/21 0417  AST 28 73* 391*  ALT 114* 87* 293*  ALKPHOS 101 109 126  BILITOT 0.8 1.0 0.6  PROT 8.8* 7.3 7.1  ALBUMIN 3.4* 2.4* 2.0*    No results for input(s): LIPASE, AMYLASE in the last 168 hours. No results for input(s): AMMONIA in the last 168 hours.  ABG    Component Value Date/Time   PHART 7.51 (H) 06/25/2021 2043   PCO2ART 44 06/25/2021 2043   PO2ART 62 (L) 06/25/2021 2043   HCO3 35.1 (H) 06/25/2021 2043   TCO2 35 (H) 07/17/2020 0038   ACIDBASEDEF 1.3 02/01/2021 1209   O2SAT 93.6 06/25/2021 2043      Coagulation Profile: No results for input(s): INR, PROTIME in the last 168 hours.  Cardiac Enzymes: No results for input(s): CKTOTAL, CKMB, CKMBINDEX, TROPONINI  in the last 168 hours.  HbA1C: Hgb A1c MFr Bld  Date/Time Value Ref Range Status  06/23/2021 11:58 AM 8.3 (H) 4.8 - 5.6 % Final    Comment:    (NOTE)         Prediabetes: 5.7 - 6.4         Diabetes: >6.4         Glycemic control for adults with diabetes: <7.0   02/02/2021 12:38 AM 6.8 (H) 4.8 - 5.6 % Final    Comment:    (NOTE) Pre diabetes:          5.7%-6.4%  Diabetes:              >6.4%  Glycemic control for   <7.0% adults with diabetes     CBG: Recent Labs  Lab 06/27/21 0548 06/27/21 0649 06/27/21 0751 06/27/21 0852 06/27/21 0931  GLUCAP 134* 135* 131* 74 75     Allergies No Known Allergies   Scheduled Meds:  acetylcysteine  1 mL Nebulization Once   budesonide (PULMICORT) nebulizer solution  0.25 mg Nebulization BID   Chlorhexidine Gluconate Cloth  6 each Topical Q0600   enoxaparin (LOVENOX) injection  40 mg Subcutaneous QHS   famotidine  20 mg Per Tube Daily   free water  150 mL Per Tube Q4H   insulin aspart  3 Units Subcutaneous Q4H   insulin aspart  3-9 Units Subcutaneous Q4H   insulin detemir  10 Units Subcutaneous Q12H   methylPREDNISolone (SOLU-MEDROL) injection  20 mg Intravenous Q12H   metoprolol succinate  12.5 mg Oral Daily    Continuous Infusions:  sodium chloride 10 mL/hr at 06/27/21 0900   ceFEPime (MAXIPIME) IV 2 g (06/26/21 2147)   dextrose 40 mL/hr at 06/27/21 0941   feeding supplement (OSMOLITE 1.5 CAL) Stopped (06/27/21 0800)   potassium chloride 10 mEq (06/27/21 0921)   vancomycin 1,000 mg (06/26/21 2249)   PRN Meds:.sodium chloride, acetaminophen (TYLENOL) oral liquid 160 mg/5 mL, albuterol, dextrose, guaiFENesin, metoprolol tartrate, ondansetron (ZOFRAN) IV  ASSESSMENT & PLAN  Acute Hypoxic Respiratory Failure secondary to multilobar Pneumonia with possible Aspiration and Mucus Plugging PMHx: Emphysema with prior smoking Initially treated with Unasyn and transitioned to PO Augmentin on 12/21 but clinically worsened and changed back to Unasyn 12/22. BCx: +Staph Hominis, Staph Haemolyticus which is usually resistant to methicillin and may be glycopeptide resistant, given worsening hypoxia, chest xray, lactid acidosis and white count, continue coverage with Vancomycin and Cefepime. -Continue BIPAP/HHFNC overnight, wean FiO2 as tolerated -Start MetaNeb for pulmonary hygiene -Supplemental O2 to maintain SpO2 >88% -F/u cultures, trend PCT -Trend PCT, monitor WBC/ fever curve -Intermittent chest x-ray & ABG PRN -Methylpred 20 mg twice daily -Budesonide inhaler nebs BID, Brovana and Yupelri nebs  Acute Kidney Injury Hypernatremia~improving Lactic Acidosis -Monitor I&O's / urinary output -Follow BMP -Ensure adequate renal perfusion -Avoid nephrotoxic agents as able -Replace electrolytes as indicated   Type II Diabetes~Poorly controlled, last HgbA1c 6.8% Hyperglycemia -CBG's q4h; Target range of 140 to 180 -SSI and Glargine -Follow ICU Hypo/Hyperglycemia protocol  Transaminitis Hepatitis profile Query shock liver from decompensation 12/22 Trend liver enzymes   Hypertension Hyperlipidemia -Home medications: Lipitor and Metoprolol   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 CT abdomen pelvis, small areas of pneumoperitoneum present, evalauted by general surgery with plans for serial abdominal xrays to monitor for changes -Ok to use PEG per surgery as of 12/19 -Tube feeds resumed by primary  Best practice (right click and "Reselect all SmartList Selections"  daily)  Diet:  Tube Feed  Pain/Anxiety/Delirium protocol (if indicated): No VAP protocol (if indicated): Not indicated DVT prophylaxis: LMWH GI prophylaxis: H2B Glucose control:  SSI Yes and Basal insulin Yes Central venous access:  N/A Arterial line:  N/A Foley:  N/A Mobility:  bed rest  PT consulted: N/A Last date of multidisciplinary goals of care discussion [12/22] Code Status:  full code Disposition: Stepdown  Level 3 follow-up      C. Derrill Kay, MD Advanced Bronchoscopy PCCM [facility]    *This note was dictated using voice recognition software/Dragon.  Despite best efforts to proofread, errors can occur which can change the meaning. Any transcriptional errors that result from this process are unintentional and may not be fully corrected at the time of dictation.

## 2021-06-27 NOTE — Progress Notes (Signed)
[facility]  ROUNDING NOTE   Subjective:   Breathing without difficulty this morning. On room air.  Kidney function within normal limits.   Na 148   UOP 1197mL.   Objective:  Vital signs in last 24 hours:  Temp:  [97.7 F (36.5 C)-99.1 F (37.3 C)] 99.1 F (37.3 C) (12/24 0758) Pulse Rate:  [82-99] 87 (12/24 0948) Resp:  [15-26] 18 (12/24 0948) BP: (95-149)/(59-95) 149/95 (12/24 0915) SpO2:  [79 %-98 %] 79 % (12/24 0948) FiO2 (%):  [35 %-60 %] 35 % (12/24 0000)  Weight change:  Filed Weights   06/21/21 1239 06/22/21 0331 06/25/21 1857  Weight: 54.4 kg 48.2 kg 54.6 kg    Intake/Output: I/O last 3 completed shifts: In: 2150.8 [I.V.:147.3; Other:190; NG/GT:1401; IV Piggyback:412.5] Out: 4742 [Urine:1450]   Intake/Output this shift:  Total I/O In: 379.4 [I.V.:10.4; Other:200; NG/GT:69; IV Piggyback:100] Out: 200 [Urine:200]  Physical Exam: General: Cachectic, NAD  Head: Normocephalic, atraumatic. Moist oral mucosal membranes  Eyes: Anicteric  Lungs:  clear  Heart: regular  Abdomen:  Soft, nontender   Extremities: No peripheral edema.  Neurologic: Awake, alert, following commands  Skin: No lesions        Basic Metabolic Panel: Recent Labs  Lab 06/23/21 0521 06/23/21 1158 06/24/21 0435 06/24/21 1439 06/25/21 0328 06/25/21 1937 06/26/21 0409 06/27/21 0417  NA 154*   < > 153* 148* 146* 146* 147* 148*  K 3.5  --  3.6  --  3.7 3.5 3.7 3.1*  CL 119*  --  116*  --  111 104 107 109  CO2 28  --  31  --  30 32 30 31  GLUCOSE 390*   < > 135*  --  283* 143* 109* 200*  BUN 38*  --  30*  --  30* 23 27* 48*  CREATININE 1.11  --  0.99  --  1.03 0.95 0.99 0.94  CALCIUM 9.1  --  9.0  --  8.5* 9.0 8.9 9.3  MG 2.6*  --  2.4  --   --  2.4 2.5* 2.9*  PHOS 2.4*  --  3.3  --   --   --   --  2.4*   < > = values in this interval not displayed.     Liver Function Tests: Recent Labs  Lab 06/21/21 1244 06/26/21 0409 06/27/21 0417  AST 28 73* 391*  ALT  114* 87* 293*  ALKPHOS 101 109 126  BILITOT 0.8 1.0 0.6  PROT 8.8* 7.3 7.1  ALBUMIN 3.4* 2.4* 2.0*    No results for input(s): LIPASE, AMYLASE in the last 168 hours. No results for input(s): AMMONIA in the last 168 hours.  CBC: Recent Labs  Lab 06/22/21 0545 06/23/21 0521 06/25/21 1937 06/26/21 0409 06/27/21 0417  WBC 9.8 11.4* 14.7* 17.1* 19.5*  NEUTROABS  --   --  13.5* 14.9* 18.2*  HGB 14.1 12.1* 13.0 12.5* 12.2*  HCT 46.0 39.2 41.6 39.6 38.8*  MCV 83.8 84.8 82.1 81.1 81.7  PLT 227 192 168 169 216     Cardiac Enzymes: Recent Labs  Lab 06/27/21 0932  CKTOTAL 77    BNP: Invalid input(s): POCBNP  CBG: Recent Labs  Lab 06/27/21 0649 06/27/21 0751 06/27/21 0852 06/27/21 0931 06/27/21 0951  GLUCAP 135* 131* 74 75 87     Microbiology: Results for orders placed or performed during the hospital encounter of 06/21/21  Resp Panel by RT-PCR (Flu A&B, Covid) Nasopharyngeal Swab     Status:  None   Collection Time: 06/21/21  2:58 PM   Specimen: Nasopharyngeal Swab; Nasopharyngeal(NP) swabs in vial transport medium  Result Value Ref Range Status   SARS Coronavirus 2 by RT PCR NEGATIVE NEGATIVE Final    Comment: (NOTE) SARS-CoV-2 target nucleic acids are NOT DETECTED.  The SARS-CoV-2 RNA is generally detectable in upper respiratory specimens during the acute phase of infection. The lowest concentration of SARS-CoV-2 viral copies this assay can detect is 138 copies/mL. A negative result does not preclude SARS-Cov-2 infection and should not be used as the sole basis for treatment or other patient management decisions. A negative result may occur with  improper specimen collection/handling, submission of specimen other than nasopharyngeal swab, presence of viral mutation(s) within the areas targeted by this assay, and inadequate number of viral copies(<138 copies/mL). A negative result must be combined with clinical observations, patient history, and  epidemiological information. The expected result is Negative.  Fact Sheet for Patients:  EntrepreneurPulse.com.au  Fact Sheet for Healthcare Providers:  IncredibleEmployment.be  This test is no t yet approved or cleared by the [place] FDA and  has been authorized for detection and/or diagnosis of SARS-CoV-2 by FDA under an Emergency Use Authorization (EUA). This EUA will remain  in effect (meaning this test can be used) for the duration of the COVID-19 declaration under Section 564(b)(1) of the Act, 21 U.S.C.section 360bbb-3(b)(1), unless the authorization is terminated  or revoked sooner.       Influenza A by PCR NEGATIVE NEGATIVE Final   Influenza B by PCR NEGATIVE NEGATIVE Final    Comment: (NOTE) The Xpert Xpress SARS-CoV-2/FLU/RSV plus assay is intended as an aid in the diagnosis of influenza from Nasopharyngeal swab specimens and should not be used as a sole basis for treatment. Nasal washings and aspirates are unacceptable for Xpert Xpress SARS-CoV-2/FLU/RSV testing.  Fact Sheet for Patients: EntrepreneurPulse.com.au  Fact Sheet for Healthcare Providers: IncredibleEmployment.be  This test is not yet approved or cleared by the [place] FDA and has been authorized for detection and/or diagnosis of SARS-CoV-2 by FDA under an Emergency Use Authorization (EUA). This EUA will remain in effect (meaning this test can be used) for the duration of the COVID-19 declaration under Section 564(b)(1) of the Act, 21 U.S.C. section 360bbb-3(b)(1), unless the authorization is terminated or revoked.  Performed at [facility], [address] [place], [place] [address]   Culture, blood (Routine X 2) w Reflex to ID Panel     Status: None   Collection Time: 06/21/21  2:58 PM   Specimen: BLOOD  Result Value Ref Range Status   Specimen Description BLOOD BLOOD RIGHT FOREARM  Final   Special  Requests   Final    BOTTLES DRAWN AEROBIC AND ANAEROBIC Blood Culture results may not be optimal due to an inadequate volume of blood received in culture bottles   Culture   Final    NO GROWTH 5 DAYS Performed at [facility], [address] [place], [place] [address]    Report Status 06/26/2021 FINAL  Final  Culture, blood (Routine X 2) w Reflex to ID Panel     Status: Abnormal (Preliminary result)   Collection Time: 06/21/21  2:58 PM   Specimen: BLOOD  Result Value Ref Range Status   Specimen Description   Final    BLOOD LEFT ANTECUBITAL Performed at [facility], [address] [place], [place] [address]    Special Requests   Final    BOTTLES DRAWN AEROBIC AND  ANAEROBIC Blood Culture results may not be optimal due to an inadequate volume of blood received in culture bottles Performed at [facility], [address] [place], [place] [address]    Culture  Setup Time   Final    GRAM POSITIVE COCCI IN BOTH AEROBIC AND ANAEROBIC BOTTLES CRITICAL RESULT CALLED TO, READ BACK BY AND VERIFIED WITH: Otto Herb PHARMD [id number] 06/22/21 HNM    Culture (A)  Final    STAPHYLOCOCCUS HOMINIS STAPHYLOCOCCUS HAEMOLYTICUS CULTURE REINCUBATED FOR BETTER GROWTH Performed at [facility], [place] [address] [place], [place] [address]    Report Status PENDING  Incomplete  Blood Culture ID Panel (Reflexed)     Status: Abnormal   Collection Time: 06/21/21  2:58 PM  Result Value Ref Range Status   Enterococcus faecalis NOT DETECTED NOT DETECTED Final   Enterococcus Faecium NOT DETECTED NOT DETECTED Final   Listeria monocytogenes NOT DETECTED NOT DETECTED Final   Staphylococcus species DETECTED (A) NOT DETECTED Final    Comment: CRITICAL RESULT CALLED TO, READ BACK BY AND VERIFIED WITH: Otto Herb PHARMD [id number] 06/22/21 HNM    Staphylococcus aureus (BCID) NOT DETECTED NOT DETECTED Final   Staphylococcus epidermidis NOT DETECTED NOT DETECTED Final    Staphylococcus lugdunensis NOT DETECTED NOT DETECTED Final   Streptococcus species NOT DETECTED NOT DETECTED Final   Streptococcus agalactiae NOT DETECTED NOT DETECTED Final   Streptococcus pneumoniae NOT DETECTED NOT DETECTED Final   Streptococcus pyogenes NOT DETECTED NOT DETECTED Final   A.calcoaceticus-baumannii NOT DETECTED NOT DETECTED Final   Bacteroides fragilis NOT DETECTED NOT DETECTED Final   Enterobacterales NOT DETECTED NOT DETECTED Final   Enterobacter cloacae complex NOT DETECTED NOT DETECTED Final   Escherichia coli NOT DETECTED NOT DETECTED Final   Klebsiella aerogenes NOT DETECTED NOT DETECTED Final   Klebsiella oxytoca NOT DETECTED NOT DETECTED Final   Klebsiella pneumoniae NOT DETECTED NOT DETECTED Final   Proteus species NOT DETECTED NOT DETECTED Final   Salmonella species NOT DETECTED NOT DETECTED Final   Serratia marcescens NOT DETECTED NOT DETECTED Final   Haemophilus influenzae NOT DETECTED NOT DETECTED Final   Neisseria meningitidis NOT DETECTED NOT DETECTED Final   Pseudomonas aeruginosa NOT DETECTED NOT DETECTED Final   Stenotrophomonas maltophilia NOT DETECTED NOT DETECTED Final   Candida albicans NOT DETECTED NOT DETECTED Final   Candida auris NOT DETECTED NOT DETECTED Final   Candida glabrata NOT DETECTED NOT DETECTED Final   Candida krusei NOT DETECTED NOT DETECTED Final   Candida parapsilosis NOT DETECTED NOT DETECTED Final   Candida tropicalis NOT DETECTED NOT DETECTED Final   Cryptococcus neoformans/gattii NOT DETECTED NOT DETECTED Final    Comment: Performed at [facility], [address] [place], [place] [address]  MRSA Next Gen by PCR, Nasal     Status: None   Collection Time: 06/21/21  6:25 PM   Specimen: Nasal Mucosa; Nasal Swab  Result Value Ref Range Status   MRSA by PCR Next Gen NOT DETECTED NOT DETECTED Final    Comment: (NOTE) The GeneXpert MRSA Assay (FDA approved for NASAL specimens only), is one component of a  comprehensive MRSA colonization surveillance program. It is not intended to diagnose MRSA infection nor to guide or monitor treatment for MRSA infections. Test performance is not FDA approved in patients less than 67 years old. Performed at [facility], [place], [place] [address]   CULTURE, BLOOD (ROUTINE X 2) w Reflex to ID Panel  Status: None (Preliminary result)   Collection Time: 06/25/21  8:05 PM   Specimen: BLOOD  Result Value Ref Range Status   Specimen Description BLOOD LEFT ANTECUBITAL  Final   Special Requests   Final    BOTTLES DRAWN AEROBIC AND ANAEROBIC Blood Culture adequate volume   Culture   Final    NO GROWTH 2 DAYS Performed at [facility], [address], [place], [place] [address]    Report Status PENDING  Incomplete  CULTURE, BLOOD (ROUTINE X 2) w Reflex to ID Panel     Status: None (Preliminary result)   Collection Time: 06/25/21  8:08 PM   Specimen: BLOOD  Result Value Ref Range Status   Specimen Description BLOOD BLOOD LEFT HAND  Final   Special Requests   Final    BOTTLES DRAWN AEROBIC AND ANAEROBIC Blood Culture adequate volume   Culture   Final    NO GROWTH 2 DAYS Performed at [facility], [address] [place], [place] [address]    Report Status PENDING  Incomplete  Urine Culture     Status: None   Collection Time: 06/25/21  8:52 PM   Specimen: Urine, Random  Result Value Ref Range Status   Specimen Description   Final    URINE, RANDOM Performed at [facility], [address], [place], [place] [address]    Special Requests   Final    NONE Performed at [facility], [address], [place], [place] [address]    Culture   Final    NO GROWTH Performed at [facility], [place] [address], [place], [place] [address]    Report Status 06/27/2021 FINAL  Final    Coagulation Studies: Recent Labs    06/27/21 0932  LABPROT 13.9  INR 1.1    Urinalysis: Recent Labs     06/25/21 2052  COLORURINE YELLOW  LABSPEC 1.015  PHURINE 5.0  GLUCOSEU NEGATIVE  HGBUR MODERATE*  BILIRUBINUR NEGATIVE  KETONESUR NEGATIVE  PROTEINUR NEGATIVE  NITRITE NEGATIVE  LEUKOCYTESUR NEGATIVE       Imaging: DG Chest Port 1 View  Result Date: 06/27/2021 CLINICAL DATA:  Hospital-acquired pneumonia EXAM: PORTABLE CHEST 1 VIEW COMPARISON:  06/25/2021 FINDINGS: Cardiac shadow is within normal limits. Bilateral basilar infiltrates are seen similar to the prior exam. No new focal infiltrate is seen. No bony abnormality is noted. IMPRESSION: Persistent bibasilar infiltrates. Electronically Signed   By: Inez Catalina M.D.   On: 06/27/2021 02:16   DG Chest Port 1 View  Result Date: 06/25/2021 CLINICAL DATA:  Pneumonia.  History of diabetes. EXAM: PORTABLE CHEST 1 VIEW COMPARISON:  CT 06/22/2021.  Radiographs 06/21/2021 and 06/05/2021. FINDINGS: 1417 hours. The heart size and mediastinal contours are stable. Extensive lower lobe predominant airspace opacities are again noted, right greater than left. These have slightly worsened compared with the most recent radiographs, but are similar to the CT of 3 days ago. No evidence of pneumothorax or significant pleural effusion. Telemetry leads overlie the chest. IMPRESSION: No significant change in multilobar bronchopneumonia compared with CT of 3 days ago. No significant pleural effusion. Electronically Signed   By: Richardean Sale M.D.   On: 06/25/2021 14:33   ECHOCARDIOGRAM COMPLETE  Result Date: 06/26/2021    ECHOCARDIOGRAM REPORT   Patient Name:   KAHLEEL FADELEY Date of Exam: 06/26/2021 Medical Rec #:  [id number]          Height:       65.0 in Accession #:  [id number]         Weight:       120.4 lb Date of Birth:  Nov 28, 1950          BSA:          1.594 m Patient Age:    56 years           BP:           107/73 mmHg Patient Gender: M                  HR:           106 bpm. Exam Location:  [facility] Procedure: 2D Echo, Color Doppler and  Cardiac Doppler Indications:     I38 Endocarditis  History:         Patient has prior history of Echocardiogram examinations, most                  recent 07/17/2020. Risk Factors:Diabetes and Current Smoker.  Sonographer:     Charmayne Sheer Referring Phys:  [place] Diagnosing Phys: Neoma Laming  Sonographer Comments: Technically challenging study due to limited acoustic windows, no parasternal window and no subcostal window. TDS due to abdominal bandage with feeding tube. IMPRESSIONS  1. Left ventricular ejection fraction, by estimation, is 55 to 60%. The left ventricle has normal function. The left ventricle has no regional wall motion abnormalities. There is mild left ventricular hypertrophy. Left ventricular diastolic parameters are consistent with Grade I diastolic dysfunction (impaired relaxation).  2. Right ventricular systolic function is normal. The right ventricular size is normal.  3. The mitral valve is normal in structure. No evidence of mitral valve regurgitation. No evidence of mitral stenosis.  4. The aortic valve is normal in structure. Aortic valve regurgitation is not visualized. No aortic stenosis is present.  5. The inferior vena cava is normal in size with greater than 50% respiratory variability, suggesting right atrial pressure of 3 mmHg. FINDINGS  Left Ventricle: Left ventricular ejection fraction, by estimation, is 55 to 60%. The left ventricle has normal function. The left ventricle has no regional wall motion abnormalities. The left ventricular internal cavity size was normal in size. There is  mild left ventricular hypertrophy. Left ventricular diastolic parameters are consistent with Grade I diastolic dysfunction (impaired relaxation). Right Ventricle: The right ventricular size is normal. No increase in right ventricular wall thickness. Right ventricular systolic function is normal. Left Atrium: Left atrial size was normal in size. Right Atrium: Right atrial size was  normal in size. Pericardium: There is no evidence of pericardial effusion. Mitral Valve: The mitral valve is normal in structure. No evidence of mitral valve regurgitation. No evidence of mitral valve stenosis. MV peak gradient, 2.4 mmHg. The mean mitral valve gradient is 1.0 mmHg. Tricuspid Valve: The tricuspid valve is normal in structure. Tricuspid valve regurgitation is not demonstrated. No evidence of tricuspid stenosis. Aortic Valve: The aortic valve is normal in structure. Aortic valve regurgitation is not visualized. No aortic stenosis is present. Aortic valve mean gradient measures 3.0 mmHg. Aortic valve peak gradient measures 5.7 mmHg. Pulmonic Valve: The pulmonic valve was normal in structure. Pulmonic valve regurgitation is not visualized. No evidence of pulmonic stenosis. Aorta: The aortic root is normal in size and structure. Venous: The inferior vena cava is normal in size with greater than 50% respiratory variability, suggesting right atrial pressure of 3 mmHg. IAS/Shunts: No atrial level shunt detected by color flow Doppler.   LV Volumes (MOD)  LV vol d, MOD A2C: 56.8 ml Diastology LV vol d, MOD A4C: 59.0 ml LV e' medial:    4.24 cm/s LV vol s, MOD A2C: 26.2 ml LV E/e' medial:  12.1 LV vol s, MOD A4C: 27.0 ml LV e' lateral:   5.11 cm/s LV SV MOD A2C:     30.6 ml LV E/e' lateral: 10.0 LV SV MOD A4C:     59.0 ml LV SV MOD BP:      31.2 ml RIGHT VENTRICLE RV S prime:     14.80 cm/s LEFT ATRIUM           Index LA Vol (A2C): 24.3 ml 15.24 ml/m  AORTIC VALVE AV Vmax:           119.00 cm/s AV Vmean:          79.800 cm/s AV VTI:            0.209 m AV Peak Grad:      5.7 mmHg AV Mean Grad:      3.0 mmHg LVOT Vmax:         91.40 cm/s LVOT Vmean:        59.900 cm/s LVOT VTI:          0.137 m LVOT/AV VTI ratio: 0.66 MITRAL VALVE MV Area (PHT): 4.96 cm    SHUNTS MV Peak grad:  2.4 mmHg    Systemic VTI: 0.14 m MV Mean grad:  1.0 mmHg MV Vmax:       0.78 m/s MV Vmean:      53.1 cm/s MV Decel Time: 153 msec MV E  velocity: 51.20 cm/s MV A velocity: 80.75 cm/s MV E/A ratio:  0.63 Shaukat Khan Electronically signed by Neoma Laming Signature Date/Time: 06/26/2021/5:53:45 PM    Final      Medications:    sodium chloride 10 mL/hr at 06/27/21 0900   ceFEPime (MAXIPIME) IV 2 g (06/26/21 2147)   dextrose 40 mL/hr at 06/27/21 0941   feeding supplement (OSMOLITE 1.5 CAL) Stopped (06/27/21 0800)   potassium chloride 10 mEq (06/27/21 0921)   vancomycin 1,000 mg (06/26/21 2249)    acetylcysteine  1 mL Nebulization Once   budesonide (PULMICORT) nebulizer solution  0.25 mg Nebulization BID   Chlorhexidine Gluconate Cloth  6 each Topical Q0600   enoxaparin (LOVENOX) injection  40 mg Subcutaneous QHS   famotidine  20 mg Per Tube Daily   free water  150 mL Per Tube Q4H   insulin aspart  3 Units Subcutaneous Q4H   insulin aspart  3-9 Units Subcutaneous Q4H   insulin detemir  10 Units Subcutaneous Q12H   metoprolol succinate  12.5 mg Oral Daily   sodium chloride, acetaminophen (TYLENOL) oral liquid 160 mg/5 mL, albuterol, dextrose, guaiFENesin, metoprolol tartrate, ondansetron (ZOFRAN) IV  Assessment/ Plan:   Mr. Louis Stone is a 70 y.o. black side effects x with oropharyngeal cancer status post G-tube placement, diabetes mellitus type II, hyperlipidemia, GERD, former tobacco abuse, who was admitted to [facility] on 06/21/2021 for Dehydration [E86.0] Hypernatremia [E87.0] Aspiration pneumonia ([place]) [J69.0] SOB (shortness of breath) [R06.02] Respiratory failure with hypoxia ([place]) [J96.91] HCAP (healthcare-associated pneumonia) [J18.9] Acute respiratory failure with hypoxemia ([place]) [J96.01]  1.  Hypernatremia: free water deficit of 1.6 liters - Dextrose infusion.  - encourage PO intake.   2.  Acute kidney injury.  Creatinine back to baseline.   3.  Acute respiratory failure secondary to pneumonia.  Breathing room air. Continue antibiotics and supportive care.   4. 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 Urinalysis with history of glycosuria and proteinuria. Hemoglobin A1c of 8.3%.   5. Hypertension: 149/95  - Continue home regimen of metoprolol.    LOS: 6 Shadee Montoya 12/24/202210:15 AM

## 2021-06-27 NOTE — Consult Note (Signed)
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 is normal.  3. The mitral valve is normal in structure. No evidence of mitral valve regurgitation. No evidence of mitral stenosis.  4. The aortic valve is normal in structure. Aortic valve regurgitation is not visualized. No aortic stenosis is present.  5. The inferior vena cava is normal in size with greater than 50% respiratory variability, suggesting right atrial pressure of 3 mmHg. FINDINGS  Left Ventricle: Left ventricular ejection fraction, by estimation, is 55 to 60%. The left ventricle has normal function. The left ventricle has no regional wall motion abnormalities. The left ventricular internal cavity size was normal in size. There is  mild left ventricular hypertrophy. Left ventricular diastolic parameters are consistent with Grade I diastolic dysfunction (impaired relaxation). Right Ventricle: The right ventricular size is normal. No increase in right ventricular wall thickness. Right ventricular systolic function is normal. Left Atrium: Left atrial size was  normal in size. Right Atrium: Right atrial size was normal in size. Pericardium: There is  no evidence of pericardial effusion. Mitral Valve: The mitral valve is normal in structure. No evidence of mitral valve regurgitation. No evidence of mitral valve stenosis. MV peak gradient, 2.4 mmHg. The mean mitral valve gradient is 1.0 mmHg. Tricuspid Valve: The tricuspid valve is normal in structure. Tricuspid valve regurgitation is not demonstrated. No evidence of tricuspid stenosis. Aortic Valve: The aortic valve is normal in structure. Aortic valve regurgitation is not visualized. No aortic stenosis is present. Aortic valve mean gradient measures 3.0 mmHg. Aortic valve peak gradient measures 5.7 mmHg. Pulmonic Valve: The pulmonic valve was normal in structure. Pulmonic valve regurgitation is not visualized. No evidence of pulmonic stenosis. Aorta: The aortic root is normal in size and structure. Venous: The inferior vena cava is normal in size with greater than 50% respiratory variability, suggesting right atrial pressure of 3 mmHg. IAS/Shunts: No atrial level shunt detected by color flow Doppler.   LV Volumes (MOD) LV vol d, MOD A2C: 56.8 ml Diastology LV vol d, MOD A4C: 59.0 ml LV e' medial:    4.24 cm/s LV vol s, MOD A2C: 26.2 ml LV E/e' medial:  12.1 LV vol s, MOD A4C: 27.0 ml LV e' lateral:   5.11 cm/s LV SV MOD A2C:     30.6 ml LV E/e' lateral: 10.0 LV SV MOD A4C:     59.0 ml LV SV MOD BP:      31.2 ml RIGHT VENTRICLE RV S prime:     14.80 cm/s LEFT ATRIUM           Index LA Vol (A2C): 24.3 ml 15.24 ml/m  AORTIC VALVE AV Vmax:           119.00 cm/s AV Vmean:          79.800 cm/s AV VTI:            0.209 m AV Peak Grad:      5.7 mmHg AV Mean Grad:      3.0 mmHg LVOT Vmax:         91.40 cm/s LVOT Vmean:        59.900 cm/s LVOT VTI:          0.137 m LVOT/AV VTI ratio: 0.66 MITRAL VALVE MV Area (PHT): 4.96 cm    SHUNTS MV Peak grad:  2.4 mmHg    Systemic VTI: 0.14 m MV Mean grad:  1.0 mmHg MV Vmax:       0.78 m/s MV Vmean:      53.1 cm/s MV Decel Time: 153 msec MV E velocity: 51.20 cm/s MV A velocity:  80.75 cm/s MV E/A ratio:  0.63 Shaukat Khan Electronically signed by Neoma Laming Signature Date/Time: 06/26/2021/5:53:45 PM    Final    US Abdomen Limited RUQ (LIVER/GB)  Result Date: 06/27/2021 CLINICAL DATA:  Transaminitis. EXAM: ULTRASOUND ABDOMEN LIMITED RIGHT UPPER QUADRANT COMPARISON:  CT 06/22/2021 FINDINGS: Gallbladder: Numerous stones within the gallbladder, the largest 1.4 cm. These appear to be mobile. No Murphy sign. No wall thickening. No surrounding fluid. Common bile duct: Diameter: 3 mm.  Normal. Liver: No focal lesion identified. Within normal limits in parenchymal echogenicity. Portal vein is patent on color Doppler imaging with normal direction of blood flow towards the liver. Other: None. IMPRESSION: Mobile stones within the gallbladder, the largest 1.4 cm. No sonographic evidence of cholecystitis or obstruction however. Electronically Signed   By: Jan Fireman.D.  On: 06/27/2021 15:38    EKG: Normal sinus rhythm rate around 80 nonspecific findings  ASSESSMENT AND PLAN:  Shortness of breath Aspiration pneumonia Acute metabolic encephalopathy Respiratory failure acute with hypoxemia Squamous cell carcinoma of the oropharynx Acute on chronic renal insufficiency Diabetes type 2 Dehydration Hypernatremia Hyperlipidemia PneumoPeritoneum Nonsustained VT . Plan Reported episode of nonsustained ventricular tachycardia asymptomatic recommend conservative management Continue management for metabolic encephalopathy with critical care support Diabetes management and control PPI for GERD Low-dose beta-blockade therapy for paroxysmal arrhythmias No invasive cardiac procedures recommended at this point Continue supplemental oxygen therapy as needed for hypoxemia No direct therapy for presumed arrhythmias at this stage Signed: Yolonda Kida MD, 06/27/2021, 5:38 PM

## 2021-06-27 NOTE — Progress Notes (Signed)
Transaminitis due to unknown etiology - STAT hepatitis panel, INR, CK - Trend hepatic function - RUQ [place] ordered - avoid hepatotoxic agents   Domingo Pulse [place], AGACNP-BC Acute Care Nurse Practitioner [facility]   [phone] / [phone] Please see Amion for pager details.

## 2021-06-27 NOTE — Consult Note (Signed)
Pharmacy Antibiotic Note  Louis Stone is a 70 y.o. male admitted on 06/21/2021 with  aspiration pneumonia .  Was initially treated with Unasyn d/t SOB and acute respiratory failure. He was changed to PO Augmentin on 12/21 but clinically worsened and changed to Unasyn 12/22. Patient transferred to the ICU later 12/22 and antibiotics changed to vancomycin and cefepime. Renal function is now stable and at apparent baseline level.  12/24: Pharmacy consulted to change antibiotics from Vanc/cefepime to Zosyn for better anaerobic coverage in case of aspiration and in setting negative MRSA PCR.   Plan:  Zosyn 3.375 gm IV q8h (4 hour infusion) Monitor clinical picture and renal function F/U C&S, abx deescalation / LOT   Height: 5\' 5"  (165.1 cm) Weight: 54.6 kg (120 lb 5.9 oz) IBW/kg (Calculated) : 61.5  Temp (24hrs), Avg:98.2 F (36.8 C), Min:97.7 F (36.5 C), Max:99.1 F (37.3 C)  Recent Labs  Lab 06/21/21 1458 06/21/21 1824 06/22/21 0151 06/22/21 0545 06/22/21 1646 06/23/21 0521 06/24/21 0435 06/25/21 0328 06/25/21 1937 06/26/21 0409 06/27/21 0417  WBC  --   --   --  9.8  --  11.4*  --   --  14.7* 17.1* 19.5*  CREATININE  --   --    < > 1.15   < > 1.11 0.99 1.03 0.95 0.99 0.94  LATICACIDVEN 2.2* 1.8  --   --   --   --   --   --  2.7*  --   --    < > = values in this interval not displayed.     Estimated Creatinine Clearance: 57.3 mL/min (by C-G formula based on SCr of 0.94 mg/dL).    No Known Allergies  Antimicrobials this admission: Unasyn 12/18 >> 12/22 Augmentin 12/21 >> 12/22 cefepime 12/22 >> 12/24 vancomycin 12/22 >> 12/24 Zosyn 12/24 >>  Microbiology results: 12/22 Bcx: NG<12 hours 12/18 BCx: Staph hominis, Staph haemolyticus 12/18 MRSA PCR: negative  Thank you for allowing pharmacy to be a part of this patients care.  Darnelle Bos, PharmD Clinical Pharmacist 06/27/2021 5:23 PM

## 2021-06-27 NOTE — Progress Notes (Signed)
PROGRESS NOTE   Louis Stone  GQB:[id number] DOB: 1951/03/09 DOA: 06/21/2021 PCP: Theotis Burrow, MD  Brief Narrative:   70 year old community dwelling black male History TN2 SCC oral cancer 2009-aspiration pneumonia + severe dysphagia on 03/12/2021 admission-was using PEG tube at the time  Recent admission 12/2 through 06/09/2021 with profound hypoglycemia discharged with home health Return to emergency room by EMS with CBG over 500 and was disoriented WBC 11.4 anion gap 9 CXR right lower lobe infiltrate CT head negative for stroke  Patient has lost 20 pounds was using Ensure bought from store [supposed to be on jevity]  Developed oxygen requirement-ultimately placed on BiPAP Found on scans 12/19 with pneumoperitoneum--General surgery consulted but this was felt to be possibly air leakage around G-tube  Nephrology consulted in addition Decompensated 12/22 with worsening respiratory failure requiring transfer to [facility] and Pulmonology was consulted additionally  Hospital-Problem based course  Acute hypoxic respiratory failure due to aspiration pneumonia Sepsis -ID consulted-Staph hominis and hemolyticus are 2 skin bacteria in the blood culture and are contaminants- no reason to suspect endocarditis- no need for TEE or treatment  Rec.discontinuing vancomycin since MRSA naris negative and hence MRSA pneumonia unlikely.  Also recommended changing cefepime to Zosyn for better coverage of oral anaerobes since he could be due to aspiration pneumonia.   Emphysema with prior smoking Transferred to SDU 12/22 critical care consulted Antibiotics broadened to cefepime and vancomycin--procalcitonin 3.2 --->15 do not de-escalate antibiotics at this time  Blood recultured-12/22-follow result-attempt sputum culture  follow urine culture 12/22 12/24 continue Brovana, Pulmicort Continue nebs Follow-up PCCM recommendation   G-tube dependent On TF  Hypernatremia      Likely free  water deficit      Dextrose infusion       Nephrology following       Monitor levels   Poorly controlled diabetes mellitus with DKA this admission and profound hypoglycemia last admission A1c 6.8  BG stable  Continue Lantus and R-ISS    AKI  Improved   12 beats of V. tach 0n 12/23    On beta block Mg stable Echo nml ef Cardiology consult     Adult failure to thrive in the setting of cancer   DVT prophylaxis: Lovenox Code Status: Full Family Communication: none at bedside  Disposition:  Status is: Inpatient  Remains inpatient appropriate because: Still hypernatremic  Consultants:  Multiple  Procedures:   Antimicrobials:   Currently Unasyn   Subjective: No cp. No dizziness   Objective: Vitals:   06/27/21 1200 06/27/21 1300 06/27/21 1400 06/27/21 1600  BP: 122/79   111/76  Pulse: 87 80 73 72  Resp: 19 17 16 19   Temp: 97.7 F (36.5 C)     TempSrc: Oral     SpO2: 95% (!) 87% 98% 93%  Weight:      Height:        Intake/Output Summary (Last 24 hours) at 06/27/2021 1703 Last data filed at 06/27/2021 1619 Gross per 24 hour  Intake 2861.24 ml  Output 1000 ml  Net 1861.24 ml   Filed Weights   06/21/21 1239 06/22/21 0331 06/25/21 1857  Weight: 54.4 kg 48.2 kg 54.6 kg    Examination: Nad, calm Decrease bs no wheezing Reg s1/s2 no gallop Soft benign +bs No edema Awake, grossly intact  Data Reviewed: personally reviewed   CBC    Component Value Date/Time   WBC 19.5 (H) 06/27/2021 0417   RBC 4.75 06/27/2021 0417   HGB 12.2 (L) 06/27/2021 0417  HCT 38.8 (L) 06/27/2021 0417   PLT 216 06/27/2021 0417   MCV 81.7 06/27/2021 0417   MCH 25.7 (L) 06/27/2021 0417   MCHC 31.4 06/27/2021 0417   RDW 14.6 06/27/2021 0417   LYMPHSABS 0.5 (L) 06/27/2021 0417   MONOABS 0.6 06/27/2021 0417   EOSABS 0.0 06/27/2021 0417   BASOSABS 0.0 06/27/2021 0417   CMP Latest Ref Rng & Units 06/27/2021 06/27/2021 06/26/2021  Glucose 70 - 99 mg/dL - 200(H)  109(H)  BUN 8 - 23 mg/dL - 48(H) 27(H)  Creatinine 0.61 - 1.24 mg/dL - 0.94 0.99  Sodium 135 - 145 mmol/L 146(H) 148(H) 147(H)  Potassium 3.5 - 5.1 mmol/L - 3.1(L) 3.7  Chloride 98 - 111 mmol/L - 109 107  CO2 22 - 32 mmol/L - 31 30  Calcium 8.9 - 10.3 mg/dL - 9.3 8.9  Total Protein 6.5 - 8.1 g/dL - 7.1 7.3  Total Bilirubin 0.3 - 1.2 mg/dL - 0.6 1.0  Alkaline Phos 38 - 126 U/L - 126 109  AST 15 - 41 U/L - 391(H) 73(H)  ALT 0 - 44 U/L - 293(H) 87(H)     Radiology Studies: DG Chest Port 1 View  Result Date: 06/27/2021 CLINICAL DATA:  Hospital-acquired pneumonia EXAM: PORTABLE CHEST 1 VIEW COMPARISON:  06/25/2021 FINDINGS: Cardiac shadow is within normal limits. Bilateral basilar infiltrates are seen similar to the prior exam. No new focal infiltrate is seen. No bony abnormality is noted. IMPRESSION: Persistent bibasilar infiltrates. Electronically Signed   By: Inez Catalina M.D.   On: 06/27/2021 02:16   ECHOCARDIOGRAM COMPLETE  Result Date: 06/26/2021    ECHOCARDIOGRAM REPORT   Patient Name:   Louis Stone Date of Exam: 06/26/2021 Medical Rec #:  [id number]          Height:       65.0 in Accession #:    [id number]         Weight:       120.4 lb Date of Birth:  14-Aug-1950          BSA:          1.594 m Patient Age:    36 years           BP:           107/73 mmHg Patient Gender: M                  HR:           106 bpm. Exam Location:  [facility] Procedure: 2D Echo, Color Doppler and Cardiac Doppler Indications:     I38 Endocarditis  History:         Patient has prior history of Echocardiogram examinations, most                  recent 07/17/2020. Risk Factors:Diabetes and Current Smoker.  Sonographer:     Charmayne Sheer Referring Phys:  [place] Diagnosing Phys: Neoma Laming  Sonographer Comments: Technically challenging study due to limited acoustic windows, no parasternal window and no subcostal window. TDS due to abdominal bandage with feeding tube. IMPRESSIONS  1. Left  ventricular ejection fraction, by estimation, is 55 to 60%. The left ventricle has normal function. The left ventricle has no regional wall motion abnormalities. There is mild left ventricular hypertrophy. Left ventricular diastolic parameters are consistent with Grade I diastolic dysfunction (impaired relaxation).  2. Right ventricular systolic function is normal. The right ventricular size is normal.  3. The mitral valve  is normal in structure. No evidence of mitral valve regurgitation. No evidence of mitral stenosis.  4. The aortic valve is normal in structure. Aortic valve regurgitation is not visualized. No aortic stenosis is present.  5. The inferior vena cava is normal in size with greater than 50% respiratory variability, suggesting right atrial pressure of 3 mmHg. FINDINGS  Left Ventricle: Left ventricular ejection fraction, by estimation, is 55 to 60%. The left ventricle has normal function. The left ventricle has no regional wall motion abnormalities. The left ventricular internal cavity size was normal in size. There is  mild left ventricular hypertrophy. Left ventricular diastolic parameters are consistent with Grade I diastolic dysfunction (impaired relaxation). Right Ventricle: The right ventricular size is normal. No increase in right ventricular wall thickness. Right ventricular systolic function is normal. Left Atrium: Left atrial size was normal in size. Right Atrium: Right atrial size was normal in size. Pericardium: There is no evidence of pericardial effusion. Mitral Valve: The mitral valve is normal in structure. No evidence of mitral valve regurgitation. No evidence of mitral valve stenosis. MV peak gradient, 2.4 mmHg. The mean mitral valve gradient is 1.0 mmHg. Tricuspid Valve: The tricuspid valve is normal in structure. Tricuspid valve regurgitation is not demonstrated. No evidence of tricuspid stenosis. Aortic Valve: The aortic valve is normal in structure. Aortic valve regurgitation is  not visualized. No aortic stenosis is present. Aortic valve mean gradient measures 3.0 mmHg. Aortic valve peak gradient measures 5.7 mmHg. Pulmonic Valve: The pulmonic valve was normal in structure. Pulmonic valve regurgitation is not visualized. No evidence of pulmonic stenosis. Aorta: The aortic root is normal in size and structure. Venous: The inferior vena cava is normal in size with greater than 50% respiratory variability, suggesting right atrial pressure of 3 mmHg. IAS/Shunts: No atrial level shunt detected by color flow Doppler.   LV Volumes (MOD) LV vol d, MOD A2C: 56.8 ml Diastology LV vol d, MOD A4C: 59.0 ml LV e' medial:    4.24 cm/s LV vol s, MOD A2C: 26.2 ml LV E/e' medial:  12.1 LV vol s, MOD A4C: 27.0 ml LV e' lateral:   5.11 cm/s LV SV MOD A2C:     30.6 ml LV E/e' lateral: 10.0 LV SV MOD A4C:     59.0 ml LV SV MOD BP:      31.2 ml RIGHT VENTRICLE RV S prime:     14.80 cm/s LEFT ATRIUM           Index LA Vol (A2C): 24.3 ml 15.24 ml/m  AORTIC VALVE AV Vmax:           119.00 cm/s AV Vmean:          79.800 cm/s AV VTI:            0.209 m AV Peak Grad:      5.7 mmHg AV Mean Grad:      3.0 mmHg LVOT Vmax:         91.40 cm/s LVOT Vmean:        59.900 cm/s LVOT VTI:          0.137 m LVOT/AV VTI ratio: 0.66 MITRAL VALVE MV Area (PHT): 4.96 cm    SHUNTS MV Peak grad:  2.4 mmHg    Systemic VTI: 0.14 m MV Mean grad:  1.0 mmHg MV Vmax:       0.78 m/s MV Vmean:      53.1 cm/s MV Decel Time: 153 msec MV E velocity: 51.20 cm/s MV  A velocity: 80.75 cm/s MV E/A ratio:  0.63 Shaukat Khan Electronically signed by Neoma Laming Signature Date/Time: 06/26/2021/5:53:45 PM    Final    US Abdomen Limited RUQ (LIVER/GB)  Result Date: 06/27/2021 CLINICAL DATA:  Transaminitis. EXAM: ULTRASOUND ABDOMEN LIMITED RIGHT UPPER QUADRANT COMPARISON:  CT 06/22/2021 FINDINGS: Gallbladder: Numerous stones within the gallbladder, the largest 1.4 cm. These appear to be mobile. No Murphy sign. No wall thickening. No surrounding fluid.  Common bile duct: Diameter: 3 mm.  Normal. Liver: No focal lesion identified. Within normal limits in parenchymal echogenicity. Portal vein is patent on color Doppler imaging with normal direction of blood flow towards the liver. Other: None. IMPRESSION: Mobile stones within the gallbladder, the largest 1.4 cm. No sonographic evidence of cholecystitis or obstruction however. Electronically Signed   By: Nelson Chimes M.D.   On: 06/27/2021 15:38     Scheduled Meds:  arformoterol  15 mcg Nebulization BID   budesonide (PULMICORT) nebulizer solution  0.25 mg Nebulization BID   chlorhexidine  15 mL Mouth Rinse BID   Chlorhexidine Gluconate Cloth  6 each Topical Q0600   enoxaparin (LOVENOX) injection  40 mg Subcutaneous QHS   famotidine  20 mg Per Tube Daily   free water  150 mL Per Tube Q4H   insulin aspart  3 Units Subcutaneous Q4H   insulin aspart  3-9 Units Subcutaneous Q4H   insulin detemir  10 Units Subcutaneous Q12H   mouth rinse  15 mL Mouth Rinse q12n4p   metoprolol succinate  12.5 mg Oral Daily   revefenacin  175 mcg Nebulization Daily   Continuous Infusions:  sodium chloride 10 mL/hr at 06/27/21 0900   ceFEPime (MAXIPIME) IV Stopped (06/27/21 1113)   dextrose 40 mL/hr at 06/27/21 1600   feeding supplement (OSMOLITE 1.5 CAL) 1,000 mL (06/27/21 1603)   vancomycin 1,000 mg (06/26/21 2249)     LOS: 6 days   Time spent: 35 minutes with more than 50% on COC  Nolberto Hanlon, MD [facility] To contact the attending provider between 7A-7P or the covering provider during after hours 7P-7A, please log into the web site [email or web address] and access using universal [place] password for that web site. If you do not have the password, please call the hospital operator.  06/27/2021, 5:03 PM

## 2021-06-28 DIAGNOSIS — G9341 Metabolic encephalopathy: Secondary | ICD-10-CM | POA: Diagnosis not present

## 2021-06-28 DIAGNOSIS — J9601 Acute respiratory failure with hypoxia: Secondary | ICD-10-CM | POA: Diagnosis not present

## 2021-06-28 DIAGNOSIS — N179 Acute kidney failure, unspecified: Secondary | ICD-10-CM | POA: Diagnosis not present

## 2021-06-28 DIAGNOSIS — J690 Pneumonitis due to inhalation of food and vomit: Secondary | ICD-10-CM | POA: Diagnosis not present

## 2021-06-28 LAB — CBC
HCT: 37 % — ABNORMAL LOW (ref 39.0–52.0)
Hemoglobin: 11.6 g/dL — ABNORMAL LOW (ref 13.0–17.0)
MCH: 25.3 pg — ABNORMAL LOW (ref 26.0–34.0)
MCHC: 31.4 g/dL (ref 30.0–36.0)
MCV: 80.6 fL (ref 80.0–100.0)
Platelets: 199 10*3/uL (ref 150–400)
RBC: 4.59 MIL/uL (ref 4.22–5.81)
RDW: 15.1 % (ref 11.5–15.5)
WBC: 15.9 10*3/uL — ABNORMAL HIGH (ref 4.0–10.5)
nRBC: 0 % (ref 0.0–0.2)

## 2021-06-28 LAB — COMPREHENSIVE METABOLIC PANEL
ALT: 333 U/L — ABNORMAL HIGH (ref 0–44)
AST: 319 U/L — ABNORMAL HIGH (ref 15–41)
Albumin: 1.9 g/dL — ABNORMAL LOW (ref 3.5–5.0)
Alkaline Phosphatase: 119 U/L (ref 38–126)
Anion gap: 9 (ref 5–15)
BUN: 46 mg/dL — ABNORMAL HIGH (ref 8–23)
CO2: 29 mmol/L (ref 22–32)
Calcium: 8.8 mg/dL — ABNORMAL LOW (ref 8.9–10.3)
Chloride: 107 mmol/L (ref 98–111)
Creatinine, Ser: 0.88 mg/dL (ref 0.61–1.24)
GFR, Estimated: >60 mL/min (ref >60)
Glucose, Bld: 178 mg/dL — ABNORMAL HIGH (ref 70–99)
Potassium: 4 mmol/L (ref 3.5–5.1)
Sodium: 145 mmol/L (ref 135–145)
Total Bilirubin: 0.6 mg/dL (ref 0.3–1.2)
Total Protein: 6.6 g/dL (ref 6.5–8.1)

## 2021-06-28 LAB — CULTURE, BLOOD (ROUTINE X 2)

## 2021-06-28 LAB — GLUCOSE, CAPILLARY
Glucose-Capillary: 189 mg/dL — ABNORMAL HIGH (ref 70–99)
Glucose-Capillary: 163 mg/dL — ABNORMAL HIGH (ref 70–99)
Glucose-Capillary: 212 mg/dL — ABNORMAL HIGH (ref 70–99)
Glucose-Capillary: 143 mg/dL — ABNORMAL HIGH (ref 70–99)

## 2021-06-28 LAB — MAGNESIUM: Magnesium: 2.8 mg/dL — ABNORMAL HIGH (ref 1.7–2.4)

## 2021-06-28 LAB — PHOSPHORUS: Phosphorus: 3.3 mg/dL (ref 2.5–4.6)

## 2021-06-28 MED ORDER — METOPROLOL TARTRATE 25 MG/10 ML ORAL SUSPENSION
6.2500 mg | Freq: Two times a day (BID) | ORAL | Status: DC
  Administered 2021-06-28 – 2021-07-02 (×9): 6.25 mg
  Filled 2021-06-28 (×15): qty 2.5

## 2021-06-28 MED ORDER — ALBUMIN HUMAN 25 % IV SOLN
25.0000 g | Freq: Two times a day (BID) | INTRAVENOUS | Status: AC
  Administered 2021-06-28 – 2021-06-30 (×4): 25 g via INTRAVENOUS
  Filled 2021-06-28 (×4): qty 100

## 2021-06-28 NOTE — Progress Notes (Signed)
PROGRESS NOTE   Louis Stone  VZD:[id number] DOB: [birth year] DOA: 06/21/2021 PCP: Theotis Burrow, MD  Brief Narrative:   70 year old community dwelling black male History TN2 SCC oral cancer 2009-aspiration pneumonia + severe dysphagia on 03/12/2021 admission-was using PEG tube at the time  Recent admission 12/2 through 06/09/2021 with profound hypoglycemia discharged with home health Return to emergency room by EMS with CBG over 500 and was disoriented WBC 11.4 anion gap 9 CXR right lower lobe infiltrate CT head negative for stroke  Patient has lost 20 pounds was using Ensure bought from store [supposed to be on jevity]  Developed oxygen requirement-ultimately placed on BiPAP Found on scans 12/19 with pneumoperitoneum--General surgery consulted but this was felt to be possibly air leakage around G-tube  Nephrology consulted in addition Decompensated 12/22 with worsening respiratory failure requiring transfer to [facility] and Pulmonology was consulted additionally  12/25 ON TF. Doing better today. Yesterday 02 apparently had dropped.   Hospital-Problem based course  Acute hypoxic respiratory failure due to aspiration pneumonia Sepsis -ID consulted-Staph hominis and hemolyticus are 2 skin bacteria in the blood culture and are contaminants- no reason to suspect endocarditis- no need for TEE or treatment  Rec.discontinuing vancomycin since MRSA naris negative and hence MRSA pneumonia unlikely.  12/25 cefepime was changed to Zosyn for better coverage of oral anaerobes since he could be due to aspiration pneumonia     Emphysema with prior smoking Transferred to SDU 12/22 critical care consulted Antibiotics broadened to cefepime and vancomycin--procalcitonin 3.2 --->15 do not de-escalate antibiotics at this time  Blood recultured-12/22-follow result-attempt sputum culture  follow urine culture 12/22 12/25 continue Brovana and Pulmicort  Continue nebs  Continue BiPAP/H  HFNC overnight wean FiO2 as tolerated Steroid d/c;d by PCCM      G-tube dependent ON TF    Hypernatremia      Likely free water deficit      Dextrose infusion    12/25 improved Na 145     Poorly controlled diabetes mellitus with DKA this admission and profound hypoglycemia last admission A1c 6.8  12/25 BG variable  Continue lantus and RISS       AKI  Improved   12 beats of V. tach 0n 12/23    On beta block Mg stable Echo nml ef Cardiology consult pending    Adult failure to thrive in the setting of cancer   DVT prophylaxis: Lovenox Code Status: Full Family Communication: none at bedside  Disposition:  Status is: Inpatient  Remains inpatient appropriate because: Still hypernatremic  Consultants:  Multiple  Procedures:   Antimicrobials:   Currently Unasyn   Subjective: No cp , no worsening sob. No dizziness   Objective: Vitals:   06/28/21 1000 06/28/21 1141 06/28/21 1200 06/28/21 1300  BP:   99/69   Pulse: 70  71 82  Resp: (!) 25  16 19   Temp:  97.6 F (36.4 C)    TempSrc:  Oral    SpO2: 95% 96% 97% 93%  Weight:      Height:        Intake/Output Summary (Last 24 hours) at 06/28/2021 1442 Last data filed at 06/28/2021 1435 Gross per 24 hour  Intake 2472.16 ml  Output 1600 ml  Net 872.16 ml   Filed Weights   06/21/21 1239 06/22/21 0331 06/25/21 1857  Weight: 54.4 kg 48.2 kg 54.6 kg    Examination: Nad, calm Aaxoxo3 Decrease bs, no rhonchi Reg s1/s2 no gallop Soft benign +bs No edema   Data  Reviewed: personally reviewed   CBC    Component Value Date/Time   WBC 15.9 (H) 06/28/2021 0529   RBC 4.59 06/28/2021 0529   HGB 11.6 (L) 06/28/2021 0529   HCT 37.0 (L) 06/28/2021 0529   PLT 199 06/28/2021 0529   MCV 80.6 06/28/2021 0529   MCH 25.3 (L) 06/28/2021 0529   MCHC 31.4 06/28/2021 0529   RDW 15.1 06/28/2021 0529   LYMPHSABS 0.5 (L) 06/27/2021 0417   MONOABS 0.6 06/27/2021 0417   EOSABS 0.0 06/27/2021 0417    BASOSABS 0.0 06/27/2021 0417   CMP Latest Ref Rng & Units 06/28/2021 06/27/2021 06/27/2021  Glucose 70 - 99 mg/dL 178(H) - 200(H)  BUN 8 - 23 mg/dL 46(H) - 48(H)  Creatinine 0.61 - 1.24 mg/dL 0.88 - 0.94  Sodium 135 - 145 mmol/L 145 146(H) 148(H)  Potassium 3.5 - 5.1 mmol/L 4.0 - 3.1(L)  Chloride 98 - 111 mmol/L 107 - 109  CO2 22 - 32 mmol/L 29 - 31  Calcium 8.9 - 10.3 mg/dL 8.8(L) - 9.3  Total Protein 6.5 - 8.1 g/dL 6.6 - 7.1  Total Bilirubin 0.3 - 1.2 mg/dL 0.6 - 0.6  Alkaline Phos 38 - 126 U/L 119 - 126  AST 15 - 41 U/L 319(H) - 391(H)  ALT 0 - 44 U/L 333(H) - 293(H)     Radiology Studies: DG Chest Port 1 View  Result Date: 06/27/2021 CLINICAL DATA:  Hospital-acquired pneumonia EXAM: PORTABLE CHEST 1 VIEW COMPARISON:  06/25/2021 FINDINGS: Cardiac shadow is within normal limits. Bilateral basilar infiltrates are seen similar to the prior exam. No new focal infiltrate is seen. No bony abnormality is noted. IMPRESSION: Persistent bibasilar infiltrates. Electronically Signed   By: Inez Catalina M.D.   On: 06/27/2021 02:16   US Abdomen Limited RUQ (LIVER/GB)  Result Date: 06/27/2021 CLINICAL DATA:  Transaminitis. EXAM: ULTRASOUND ABDOMEN LIMITED RIGHT UPPER QUADRANT COMPARISON:  CT 06/22/2021 FINDINGS: Gallbladder: Numerous stones within the gallbladder, the largest 1.4 cm. These appear to be mobile. No Murphy sign. No wall thickening. No surrounding fluid. Common bile duct: Diameter: 3 mm.  Normal. Liver: No focal lesion identified. Within normal limits in parenchymal echogenicity. Portal vein is patent on color Doppler imaging with normal direction of blood flow towards the liver. Other: None. IMPRESSION: Mobile stones within the gallbladder, the largest 1.4 cm. No sonographic evidence of cholecystitis or obstruction however. Electronically Signed   By: Nelson Chimes M.D.   On: 06/27/2021 15:38     Scheduled Meds:  arformoterol  15 mcg Nebulization BID   budesonide (PULMICORT)  nebulizer solution  0.25 mg Nebulization BID   chlorhexidine  15 mL Mouth Rinse BID   Chlorhexidine Gluconate Cloth  6 each Topical Q0600   enoxaparin (LOVENOX) injection  40 mg Subcutaneous QHS   famotidine  20 mg Per Tube Daily   free water  150 mL Per Tube Q4H   insulin aspart  0-20 Units Subcutaneous Q4H   insulin aspart  3 Units Subcutaneous Q4H   insulin detemir  15 Units Subcutaneous Q12H   mouth rinse  15 mL Mouth Rinse q12n4p   metoprolol tartrate  6.25 mg Per Tube BID   revefenacin  175 mcg Nebulization Daily   Continuous Infusions:  sodium chloride 10 mL/hr at 06/28/21 1434   feeding supplement (OSMOLITE 1.5 CAL) 1,000 mL (06/28/21 1029)   piperacillin-tazobactam (ZOSYN)  IV 12.5 mL/hr at 06/28/21 1400     LOS: 7 days   Time spent: 35 minutes with more than  50% on COC  Nolberto Hanlon, MD [facility] To contact the attending provider between 7A-7P or the covering provider during after hours 7P-7A, please log into the web site [email or web address] and access using universal [place] password for that web site. If you do not have the password, please call the hospital operator.  06/28/2021, 2:42 PM

## 2021-06-28 NOTE — Progress Notes (Signed)
NAME:  Louis Stone, MRN:  [id number], DOB:  [birth year], LOS: 7 ADMISSION DATE:  06/21/2021, INITIAL CONSULTATION DATE:  06/22/2021, RE CONSULT DATE: 06/25/21 REFERRING MD:  Nita Sells, MD, CHIEF COMPLAINT:  Hypoxia   Brief Patient Description  70yo male w/ a past medical history of oropharynx cancer s/p G-tube placement, Type II diabetes, HLD, GERD and former smoker who was admitted to hospitalist service with acute hypoxic respiratory failure secondary to suspected aspiration pneumonia.   ED COURSE: Per the medical record, the patient has been having these symptoms for a few days. Denies fevers. ED workup revealed: elevated glucose of 500, WBC 11.4, Cr 1.58, Na 153, chest xray which revealed right lower lobe infiltrate concerning for aspiration pneumonia and initially hypoxic w/sats of 88% that improved with 2L [place]. The patients received 1L IVFs, unasyn and was started on IVFs at 75cc/hr. Cultures sent. COVID and Flu negative. PCCM consulted on 12/19 due to worsening hypoxia  HOSPITAL COURSE: CTA  was obtained  which was negative for PE but showed pneumoperitoneum.  Surgery consulted, free air felt to be due to PEG tube.  Nephrology consulted for AKI and Hypernatremia 12/20: Weaned to 3L [place].  AKI and Hypernatremia improving. Patient transferred to [facility] unit. On 12/21, patient was requiring less oxygen and was transitioned from Unasyn to Augmentin. On 12/22, patient transferred to PCU on 13L HFNC, however decompensated further with increased oxygen needs requiring transfer to stepdown. PCCM re-consulted due to high risk for decompensation  Pertinent  Medical History  Type II Diabetes Oropharynx cancer s/p Gtube placement Hyperlipidemia Hypertension  Significant Hospital Events: Including procedures, antibiotic start and stop dates in addition to other pertinent events   12/19: Admitted. Optiflow. CTA negative for PE, did show pneumoperitoneum.  Surgery consulted, free air felt  to be due to PEG tube.  Nephrology consulted for AKI and Hypernatremia 12/20: Weaned to 3L [place].  AKI and Hypernatremia improving. Patient transferred to [facility] unit 12/21: Requiring less oxygen on . Transitioned from Unasyn to Augmentin 12/22: Patient transferred to PCU on 13L HFNC, however decompensated further with increased oxygen needs requiring transfer to stepdown. PCCM re-consulted due to high risk for decompensation. 12/24: On high flow O2, MetaNeb for pulmonary hygiene   Microbiology Results:  12/18: SARS-CoV-2 PCR>> negative 12/18: Influenza PCR>> negative 06/21/21: Strep pneumo urinary antigen>>negative 06/21/21: Blood culture>> 1/2 with Staph Hominis + Haemolyticus  12/18: MRSA PCR>> negative 12/22: Blood culture x2>>No growth thus far 12/22: Urine Culture>>no growth   Antimicrobials:  Unasyn 12/18 >> stop 12/22 Augmentin 12/21 >> 12/22 Cefepime 12/22>>12/24 Vancomycin 12/22>>12/24 Zosyn (infusion) 12/24>>  Interim History / Subjective:  -Afebrile, hemodynamically stable, no complaints -Feels better -Transition to bubble high flow cannula -Doing well with MetaNeb -3 L positive since admit, diuresed  OBJECTIVE   Blood pressure 103/72, pulse 70, temperature (!) 97.3 F (36.3 C), temperature source Oral, resp. rate (!) 25, height 5\' 5"  (1.651 m), weight 54.6 kg, SpO2 96 %.    FiO2 (%):  [40 %-70 %] 40 %   Intake/Output Summary (Last 24 hours) at 06/28/2021 1148 Last data filed at 06/28/2021 1100 Gross per 24 hour  Intake 2371.69 ml  Output 1150 ml  Net 1221.69 ml    Filed Weights   06/21/21 1239 06/22/21 0331 06/25/21 1857  Weight: 54.4 kg 48.2 kg 54.6 kg   Physical Examination: GENERAL: Chronically ill-appearing gentleman, cachectic, no respiratory distress on bubble high flow EYES: Pupils equal, round, reactive to light and accommodation. No scleral icterus.  HEENT: Head atraumatic, normocephalic. Oropharynx and nasopharynx clear.  Temporal  wasting. NECK:  Supple, no jugular venous distention.  Trachea midline. LUNGS: Decreased  breath sounds bilaterally, no wheezing, rales/crackles on right. No use of accessory muscles of respiration.  CARDIOVASCULAR: S1, S2 normal. No murmurs, rubs, or gallops.  ABDOMEN: Soft, nontender, nondistended. Bowel sounds present. No organomegaly or mass. PEG+ EXTREMITIES: No pedal edema, cyanosis, or clubbing.  NEUROLOGIC: No overt focal deficit. PSYCHIATRIC: The patient is alert and oriented x 3.  SKIN: No obvious rash, lesion, or ulcer.    Labs   CBC: Recent Labs  Lab 06/23/21 0521 06/25/21 1937 06/26/21 0409 06/27/21 0417 06/28/21 0529  WBC 11.4* 14.7* 17.1* 19.5* 15.9*  NEUTROABS  --  13.5* 14.9* 18.2*  --   HGB 12.1* 13.0 12.5* 12.2* 11.6*  HCT 39.2 41.6 39.6 38.8* 37.0*  MCV 84.8 82.1 81.1 81.7 80.6  PLT 192 168 169 216 199     Basic Metabolic Panel: Recent Labs  Lab 06/23/21 0521 06/23/21 1158 06/24/21 0435 06/24/21 1439 06/25/21 0328 06/25/21 1937 06/26/21 0409 06/27/21 0417 06/27/21 1605 06/28/21 0529  NA 154*   < > 153*   < > 146* 146* 147* 148* 146* 145  K 3.5  --  3.6  --  3.7 3.5 3.7 3.1*  --  4.0  CL 119*  --  116*  --  111 104 107 109  --  107  CO2 28  --  31  --  30 32 30 31  --  29  GLUCOSE 390*   < > 135*  --  283* 143* 109* 200*  --  178*  BUN 38*  --  30*  --  30* 23 27* 48*  --  46*  CREATININE 1.11  --  0.99  --  1.03 0.95 0.99 0.94  --  0.88  CALCIUM 9.1  --  9.0  --  8.5* 9.0 8.9 9.3  --  8.8*  MG 2.6*  --  2.4  --   --  2.4 2.5* 2.9*  --  2.8*  PHOS 2.4*  --  3.3  --   --   --   --  2.4*  --  3.3   < > = values in this interval not displayed.    GFR: Estimated Creatinine Clearance: 61.2 mL/min (by C-G formula based on SCr of 0.88 mg/dL). Recent Labs  Lab 06/21/21 1244 06/21/21 1458 06/21/21 1824 06/22/21 0545 06/25/21 1937 06/26/21 0409 06/27/21 0417 06/28/21 0529  PROCALCITON 5.35  --   --   --  3.21 15.54 14.18  --   WBC 11.4*  --    --    < > 14.7* 17.1* 19.5* 15.9*  LATICACIDVEN  --  2.2* 1.8  --  2.7*  --   --   --    < > = values in this interval not displayed.     Liver Function Tests: Recent Labs  Lab 06/21/21 1244 06/26/21 0409 06/27/21 0417 06/28/21 0529  AST 28 73* 391* 319*  ALT 114* 87* 293* 333*  ALKPHOS 101 109 126 119  BILITOT 0.8 1.0 0.6 0.6  PROT 8.8* 7.3 7.1 6.6  ALBUMIN 3.4* 2.4* 2.0* 1.9*    No results for input(s): LIPASE, AMYLASE in the last 168 hours. No results for input(s): AMMONIA in the last 168 hours.  ABG    Component Value Date/Time   PHART 7.44 06/27/2021 1115   PCO2ART 51 (H) 06/27/2021 1115   PO2ART 51 (  L) 06/27/2021 1115   HCO3 34.6 (H) 06/27/2021 1115   TCO2 35 (H) 07/17/2020 0038   ACIDBASEDEF 1.3 02/01/2021 1209   O2SAT 87.1 06/27/2021 1115      Coagulation Profile: Recent Labs  Lab 06/27/21 0932  INR 1.1    Cardiac Enzymes: Recent Labs  Lab 06/27/21 0932  CKTOTAL 77    HbA1C: Hgb A1c MFr Bld  Date/Time Value Ref Range Status  06/23/2021 11:58 AM 8.3 (H) 4.8 - 5.6 % Final    Comment:    (NOTE)         Prediabetes: 5.7 - 6.4         Diabetes: >6.4         Glycemic control for adults with diabetes: <7.0   02/02/2021 12:38 AM 6.8 (H) 4.8 - 5.6 % Final    Comment:    (NOTE) Pre diabetes:          5.7%-6.4%  Diabetes:              >6.4%  Glycemic control for   <7.0% adults with diabetes     CBG: Recent Labs  Lab 06/27/21 1931 06/27/21 2151 06/27/21 2307 06/28/21 0352 06/28/21 0753  GLUCAP 289* 254* 241* 189* 163*   BNP (last 3 results) Recent Labs    06/21/21 1244 06/25/21 1937 06/27/21 0417  BNP 107.5* 174.2* 83.9     Allergies No Known Allergies   Scheduled Meds:  arformoterol  15 mcg Nebulization BID   budesonide (PULMICORT) nebulizer solution  0.25 mg Nebulization BID   chlorhexidine  15 mL Mouth Rinse BID   Chlorhexidine Gluconate Cloth  6 each Topical Q0600   enoxaparin (LOVENOX) injection  40 mg  Subcutaneous QHS   famotidine  20 mg Per Tube Daily   free water  150 mL Per Tube Q4H   insulin aspart  0-20 Units Subcutaneous Q4H   insulin aspart  3 Units Subcutaneous Q4H   insulin detemir  15 Units Subcutaneous Q12H   mouth rinse  15 mL Mouth Rinse q12n4p   metoprolol tartrate  6.25 mg Per Tube BID   revefenacin  175 mcg Nebulization Daily   Continuous Infusions:  sodium chloride 10 mL/hr at 06/27/21 0900   feeding supplement (OSMOLITE 1.5 CAL) 1,000 mL (06/28/21 1029)   piperacillin-tazobactam (ZOSYN)  IV 12.5 mL/hr at 06/28/21 1100   PRN Meds:.sodium chloride, acetaminophen (TYLENOL) oral liquid 160 mg/5 mL, albuterol, guaiFENesin, metoprolol tartrate, ondansetron (ZOFRAN) IV  ASSESSMENT & PLAN  Acute Hypoxic Respiratory Failure secondary to multilobar Pneumonia with possible Aspiration and Mucus Plugging PMHx: Emphysema with prior smoking -Continue BIPAP/HHFNC at nighttime, wean FiO2 as tolerated -Continue MetaNeb for pulmonary hygiene -Supplemental O2 to maintain SpO2 >88% -F/u cultures, trend PCT -Monitor WBC/ fever curve -Intermittent chest x-ray & ABG PRN -Budesonide inhaler nebs BID, Brovana and Yupelri nebs -continue Zosyn  Acute Kidney Injury Hypernatremia~improving Lactic Acidosis -Monitor I&O's / urinary output -Follow BMP -Ensure adequate renal perfusion -Avoid nephrotoxic agents as able -Replace electrolytes as indicated   Type II Diabetes~Poorly controlled, last HgbA1c 6.8% Hyperglycemia -CBG's q4h; Target range of 140 to 180 -SSI and Glargine -Follow ICU Hypo/Hyperglycemia protocol  Transaminitis Hepatitis profile Query shock liver from decompensation 12/22 Trend liver enzymes Monitor Avoid hepatotoxic agents   Hypertension Hyperlipidemia -Home medications: Lipitor and Metoprolol   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 CT abdomen pelvis, small areas of pneumoperitoneum present, evalauted by general surgery with plans for  serial abdominal xrays to monitor for changes -[facility]  to use PEG per surgery as of 12/19 -Tube feeds resumed by primary  Severe protein calorie malnutrition -Continue tube feeds -Albumin supplementation -Albumin 1.9   Best practice (right click and "Reselect all SmartList Selections" daily)  Diet:  Tube Feed  Pain/Anxiety/Delirium protocol (if indicated): No VAP protocol (if indicated): Not indicated DVT prophylaxis: LMWH GI prophylaxis: H2B Glucose control:  SSI Yes and Basal insulin Yes Central venous access:  N/A Arterial line:  N/A Foley:  N/A Mobility:  bed rest  PT consulted: N/A Last date of multidisciplinary goals of care discussion [12/22] Code Status:  full code Disposition: Stepdown  Level 3 follow-up    Prognosis is guarded overall.  Palliative care following patient.  Renold Don, MD Advanced Bronchoscopy PCCM [facility]    *This note was dictated using voice recognition software/Dragon.  Despite best efforts to proofread, errors can occur which can change the meaning. Any transcriptional errors that result from this process are unintentional and may not be fully corrected at the time of dictation.

## 2021-06-28 NOTE — Progress Notes (Signed)
[facility]    SUBJECTIVE: Resting comfortably slightly confused denies any pain no palpitation no tachycardia respiratory status reasonably stable at this point   Vitals:   06/28/21 0821 06/28/21 1000 06/28/21 1141 06/28/21 1200  BP:    99/69  Pulse:  70  71  Resp:  (!) 25  16  Temp:   97.6 F (36.4 C)   TempSrc:   Oral   SpO2: 100% 95% 96% 97%  Weight:      Height:         Intake/Output Summary (Last 24 hours) at 06/28/2021 1310 Last data filed at 06/28/2021 1200 Gross per 24 hour  Intake 2276.08 ml  Output 1200 ml  Net 1076.08 ml      PHYSICAL EXAM  General: Well developed, well nourished, in no acute distress HEENT:  Normocephalic and atramatic Neck:  No JVD.  Lungs: Clear bilaterally to auscultation and percussion. Heart: HRRR . Normal S1 and S2 without gallops or murmurs.  Abdomen: Bowel sounds are positive, abdomen soft and non-tender  Msk:  Back normal, normal gait. Normal strength and tone for age. Extremities: No clubbing, cyanosis or edema.   Neuro: Alert and oriented X 3. Psych:  Good affect, responds appropriately   LABS: Basic Metabolic Panel: Recent Labs    06/27/21 0417 06/27/21 1605 06/28/21 0529  NA 148* 146* 145  K 3.1*  --  4.0  CL 109  --  107  CO2 31  --  29  GLUCOSE 200*  --  178*  BUN 48*  --  46*  CREATININE 0.94  --  0.88  CALCIUM 9.3  --  8.8*  MG 2.9*  --  2.8*  PHOS 2.4*  --  3.3   Liver Function Tests: Recent Labs    06/27/21 0417 06/28/21 0529  AST 391* 319*  ALT 293* 333*  ALKPHOS 126 119  BILITOT 0.6 0.6  PROT 7.1 6.6  ALBUMIN 2.0* 1.9*   No results for input(s): LIPASE, AMYLASE in the last 72 hours. CBC: Recent Labs    06/26/21 0409 06/27/21 0417 06/28/21 0529  WBC 17.1* 19.5* 15.9*  NEUTROABS 14.9* 18.2*  --   HGB 12.5* 12.2* 11.6*  HCT 39.6 38.8* 37.0*  MCV 81.1 81.7 80.6  PLT 169 216 199   Cardiac Enzymes: Recent Labs    06/27/21 0932  CKTOTAL 77   BNP: Invalid input(s):  POCBNP D-Dimer: No results for input(s): DDIMER in the last 72 hours. Hemoglobin A1C: No results for input(s): HGBA1C in the last 72 hours. Fasting Lipid Panel: No results for input(s): CHOL, HDL, LDLCALC, TRIG, CHOLHDL, LDLDIRECT in the last 72 hours. Thyroid Function Tests: No results for input(s): TSH, T4TOTAL, T3FREE, THYROIDAB in the last 72 hours.  Invalid input(s): FREET3 Anemia Panel: No results for input(s): VITAMINB12, FOLATE, FERRITIN, TIBC, IRON, RETICCTPCT in the last 72 hours.  DG Chest Port 1 View  Result Date: 06/27/2021 CLINICAL DATA:  Hospital-acquired pneumonia EXAM: PORTABLE CHEST 1 VIEW COMPARISON:  06/25/2021 FINDINGS: Cardiac shadow is within normal limits. Bilateral basilar infiltrates are seen similar to the prior exam. No new focal infiltrate is seen. No bony abnormality is noted. IMPRESSION: Persistent bibasilar infiltrates. Electronically Signed   By: Inez Catalina M.D.   On: 06/27/2021 02:16   US Abdomen Limited RUQ (LIVER/GB)  Result Date: 06/27/2021 CLINICAL DATA:  Transaminitis. EXAM: ULTRASOUND ABDOMEN LIMITED RIGHT UPPER QUADRANT COMPARISON:  CT 06/22/2021 FINDINGS: Gallbladder: Numerous stones within the gallbladder, the largest 1.4 cm. These appear to be  mobile. No Murphy sign. No wall thickening. No surrounding fluid. Common bile duct: Diameter: 3 mm.  Normal. Liver: No focal lesion identified. Within normal limits in parenchymal echogenicity. Portal vein is patent on color Doppler imaging with normal direction of blood flow towards the liver. Other: None. IMPRESSION: Mobile stones within the gallbladder, the largest 1.4 cm. No sonographic evidence of cholecystitis or obstruction however. Electronically Signed   By: Nelson Chimes M.D.   On: 06/27/2021 15:38     Echo preserved left ventricular function around 60  TELEMETRY: Normal sinus rhythm rate of 80 nonspecific findings:  ASSESSMENT AND PLAN:  Principal Problem:   Aspiration pneumonia  ([place]) Active Problems:   Acute metabolic encephalopathy   GERD without esophagitis   Acute respiratory failure with hypoxia (HCC)   Squamous cell carcinoma of oropharynx (HCC)   Acute renal failure (ARF) (HCC)   Protein-calorie malnutrition, severe   Diabetes mellitus without complication (HCC)   Dehydration   Hypernatremia   HLD (hyperlipidemia)   Acute respiratory failure with hypoxemia (HCC)   Pneumoperitoneum of unknown etiology    Plan Reported history of nonsustained ventricular tachycardia asymptomatic continue current medical therapy Metabolic encephalopathy continue supportive care Continue diabetes management and control Acute respiratory failure hypoxemia somewhat in proved Maintain PPI for GERD Statin therapy for hyperlipidemia Low-dose beta-blockade therapy may be helpful for paroxysmal arrhythmia Invasive cardiac procedures recommended or indicated Agree with supplemental oxygen for hypoxemia Do not recommend any direct therapy for paroxysmal arrhythmias  Yolonda Kida, MD 06/28/2021 1:10 PM

## 2021-06-29 ENCOUNTER — Inpatient Hospital Stay: Payer: Medicare Other

## 2021-06-29 DIAGNOSIS — J9601 Acute respiratory failure with hypoxia: Secondary | ICD-10-CM | POA: Diagnosis not present

## 2021-06-29 DIAGNOSIS — J690 Pneumonitis due to inhalation of food and vomit: Secondary | ICD-10-CM | POA: Diagnosis not present

## 2021-06-29 DIAGNOSIS — N179 Acute kidney failure, unspecified: Secondary | ICD-10-CM | POA: Diagnosis not present

## 2021-06-29 DIAGNOSIS — G9341 Metabolic encephalopathy: Secondary | ICD-10-CM | POA: Diagnosis not present

## 2021-06-29 LAB — MAGNESIUM: Magnesium: 2.7 mg/dL — ABNORMAL HIGH (ref 1.7–2.4)

## 2021-06-29 LAB — CBC
HCT: 34.6 % — ABNORMAL LOW (ref 39.0–52.0)
Hemoglobin: 10.6 g/dL — ABNORMAL LOW (ref 13.0–17.0)
MCH: 24.7 pg — ABNORMAL LOW (ref 26.0–34.0)
MCHC: 30.6 g/dL (ref 30.0–36.0)
MCV: 80.7 fL (ref 80.0–100.0)
Platelets: 235 10*3/uL (ref 150–400)
RBC: 4.29 MIL/uL (ref 4.22–5.81)
RDW: 14.8 % (ref 11.5–15.5)
WBC: 11 10*3/uL — ABNORMAL HIGH (ref 4.0–10.5)
nRBC: 0 % (ref 0.0–0.2)

## 2021-06-29 LAB — BASIC METABOLIC PANEL
Anion gap: 5 (ref 5–15)
BUN: 33 mg/dL — ABNORMAL HIGH (ref 8–23)
CO2: 33 mmol/L — ABNORMAL HIGH (ref 22–32)
Calcium: 9 mg/dL (ref 8.9–10.3)
Chloride: 106 mmol/L (ref 98–111)
Creatinine, Ser: 0.93 mg/dL (ref 0.61–1.24)
GFR, Estimated: >60 mL/min (ref >60)
Glucose, Bld: 134 mg/dL — ABNORMAL HIGH (ref 70–99)
Potassium: 3.7 mmol/L (ref 3.5–5.1)
Sodium: 144 mmol/L (ref 135–145)

## 2021-06-29 LAB — GLUCOSE, CAPILLARY
Glucose-Capillary: 104 mg/dL — ABNORMAL HIGH (ref 70–99)
Glucose-Capillary: 134 mg/dL — ABNORMAL HIGH (ref 70–99)
Glucose-Capillary: 139 mg/dL — ABNORMAL HIGH (ref 70–99)
Glucose-Capillary: 160 mg/dL — ABNORMAL HIGH (ref 70–99)
Glucose-Capillary: 170 mg/dL — ABNORMAL HIGH (ref 70–99)
Glucose-Capillary: 171 mg/dL — ABNORMAL HIGH (ref 70–99)
Glucose-Capillary: 175 mg/dL — ABNORMAL HIGH (ref 70–99)
Glucose-Capillary: 86 mg/dL (ref 70–99)

## 2021-06-29 LAB — PHOSPHORUS: Phosphorus: 3.3 mg/dL (ref 2.5–4.6)

## 2021-06-29 MED ORDER — SODIUM BICARBONATE 650 MG PO TABS
650.0000 mg | ORAL_TABLET | Freq: Once | ORAL | Status: AC
  Administered 2021-06-29: 11:00:00 650 mg
  Filled 2021-06-29: qty 1

## 2021-06-29 MED ORDER — PANCRELIPASE (LIP-PROT-AMYL) 10440-39150 UNITS PO TABS
20880.0000 [IU] | ORAL_TABLET | Freq: Once | ORAL | Status: AC
  Administered 2021-06-29: 11:00:00 20880 [IU]
  Filled 2021-06-29: qty 2

## 2021-06-29 MED ORDER — DEXTROSE 10 % IV SOLN: INTRAVENOUS | Status: DC

## 2021-06-29 NOTE — Progress Notes (Signed)
[facility]    SUBJECTIVE: Patient states to be doing reasonably well denies any shortness of breath denies any palpitations slightly confused   Vitals:   06/29/21 0000 06/29/21 0400 06/29/21 0710 06/29/21 0800  BP: 109/67 104/66  120/67  Pulse: 82 81    Resp: (!) 23 (!) 27    Temp: 98.3 F (36.8 C) 98.6 F (37 C) 98 F (36.7 C)   TempSrc: Oral Oral Oral   SpO2: 99% 96%    Weight:      Height:         Intake/Output Summary (Last 24 hours) at 06/29/2021 1884 Last data filed at 06/29/2021 0700 Gross per 24 hour  Intake 2547.59 ml  Output 1725 ml  Net 822.59 ml      PHYSICAL EXAM  General: Well developed, well nourished, in no acute distress HEENT:  Normocephalic and atramatic Neck:  No JVD.  Lungs: Clear bilaterally to auscultation and percussion. Heart: HRRR . Normal S1 and S2 without gallops or murmurs.  Abdomen: Bowel sounds are positive, abdomen soft and non-tender  Msk:  Back normal, normal gait. Normal strength and tone for age. Extremities: No clubbing, cyanosis or edema.   Neuro: Alert and disoriented  Psych:  Good affect, responds appropriately   LABS: Basic Metabolic Panel: Recent Labs    06/28/21 0529 06/29/21 0502  NA 145 144  K 4.0 3.7  CL 107 106  CO2 29 33*  GLUCOSE 178* 134*  BUN 46* 33*  CREATININE 0.88 0.93  CALCIUM 8.8* 9.0  MG 2.8* 2.7*  PHOS 3.3 3.3   Liver Function Tests: Recent Labs    06/27/21 0417 06/28/21 0529  AST 391* 319*  ALT 293* 333*  ALKPHOS 126 119  BILITOT 0.6 0.6  PROT 7.1 6.6  ALBUMIN 2.0* 1.9*   No results for input(s): LIPASE, AMYLASE in the last 72 hours. CBC: Recent Labs    06/27/21 0417 06/28/21 0529 06/29/21 0502  WBC 19.5* 15.9* 11.0*  NEUTROABS 18.2*  --   --   HGB 12.2* 11.6* 10.6*  HCT 38.8* 37.0* 34.6*  MCV 81.7 80.6 80.7  PLT 216 199 235   Cardiac Enzymes: Recent Labs    06/27/21 0932  CKTOTAL 77   BNP: Invalid input(s): POCBNP D-Dimer: No results for input(s): DDIMER in  the last 72 hours. Hemoglobin A1C: No results for input(s): HGBA1C in the last 72 hours. Fasting Lipid Panel: No results for input(s): CHOL, HDL, LDLCALC, TRIG, CHOLHDL, LDLDIRECT in the last 72 hours. Thyroid Function Tests: No results for input(s): TSH, T4TOTAL, T3FREE, THYROIDAB in the last 72 hours.  Invalid input(s): FREET3 Anemia Panel: No results for input(s): VITAMINB12, FOLATE, FERRITIN, TIBC, IRON, RETICCTPCT in the last 72 hours.  DG Chest Port 1 View  Result Date: 06/29/2021 CLINICAL DATA:  Pneumonia EXAM: PORTABLE CHEST 1 VIEW COMPARISON:  06/27/2021 FINDINGS: Patchy bilateral lower lobe opacities, compatible with pneumonia, mildly progressive. No pleural effusion or pneumothorax. The heart is normal in size.  Thoracic aortic atherosclerosis. IMPRESSION: Bilateral lower lobe pneumonia, mildly progressive. Electronically Signed   By: Julian Hy M.D.   On: 06/29/2021 05:38   US Abdomen Limited RUQ (LIVER/GB)  Result Date: 06/27/2021 CLINICAL DATA:  Transaminitis. EXAM: ULTRASOUND ABDOMEN LIMITED RIGHT UPPER QUADRANT COMPARISON:  CT 06/22/2021 FINDINGS: Gallbladder: Numerous stones within the gallbladder, the largest 1.4 cm. These appear to be mobile. No Murphy sign. No wall thickening. No surrounding fluid. Common bile duct: Diameter: 3 mm.  Normal. Liver: No focal lesion identified.  Within normal limits in parenchymal echogenicity. Portal vein is patent on color Doppler imaging with normal direction of blood flow towards the liver. Other: None. IMPRESSION: Mobile stones within the gallbladder, the largest 1.4 cm. No sonographic evidence of cholecystitis or obstruction however. Electronically Signed   By: Nelson Chimes M.D.   On: 06/27/2021 15:38     Echo preserved left ventricular function 55%  TELEMETRY: Sinus rhythm rate 75:  ASSESSMENT AND PLAN:  Principal Problem:   Aspiration pneumonia (HCC) Active Problems:   Acute metabolic encephalopathy   GERD without  esophagitis   Acute respiratory failure with hypoxia (HCC)   Squamous cell carcinoma of oropharynx (HCC)   Acute renal failure (ARF) (HCC)   Protein-calorie malnutrition, severe   Diabetes mellitus without complication (HCC)   Dehydration   Hypernatremia   HLD (hyperlipidemia)   Acute respiratory failure with hypoxemia (HCC)   Pneumoperitoneum of unknown etiology    Plan Agree with ICU level care for encephalopathy No further tachycardia arrhythmias recommend conservative management Continue supportive care for encephalopathy Agree diabetes management and control Statin therapy for hyperlipidemia Abnormal oxygen inhalers as necessary for respiratory failure hypoxemia No further cardiac work-up or modifications recommended   Yolonda Kida, MD, 06/29/2021 8:12 AM

## 2021-06-29 NOTE — Progress Notes (Signed)
Physical Therapy Treatment Patient Details Name: Louis Stone MRN: [id number] DOB: September 28, 1950 Today's Date: 06/29/2021   History of Present Illness Louis Stone is a 70 y.o. male with medical history significant of oropharynx cancer, s/p of G-tube placement, diabetes mellitus, hyperlipidemia, GERD, former smoker, aspiration pneumonia, who presents with shortness breath and cough.    PT Comments    Pt was pleasant and motivated to participate during the session and put forth good effort throughout. Pt made good progress towards goals this session including grossly decreased assistance required for bed mobility and transfers and improved activity tolerance with gait.  Pt required UE support on the RW as well as occasional min A for stability during ambulation but overall gait was much improved compared to prior session. Pt will benefit from PT services in a SNF setting upon discharge to safely address deficits listed in patient problem list for decreased caregiver assistance and eventual return to PLOF.    Recommendations for follow up therapy are one component of a multi-disciplinary discharge planning process, led by the attending physician.  Recommendations may be updated based on patient status, additional functional criteria and insurance authorization.  Follow Up Recommendations  Skilled nursing-short term rehab (<3 hours/day)     Assistance Recommended at Discharge Frequent or constant Supervision/Assistance  Equipment Recommendations  Other (comment) (TBD at next venue of care)    Recommendations for Other Services       Precautions / Restrictions Precautions Precautions: Fall Restrictions Weight Bearing Restrictions: No     Mobility  Bed Mobility Overal bed mobility: Modified Independent             General bed mobility comments: Min extra time and effort    Transfers Overall transfer level: Needs assistance Equipment used: Rolling walker (2  wheels) Transfers: Sit to/from Stand Sit to Stand: Min assist;From elevated surface           General transfer comment: Fair stability upon coming to standing with min to mod lean on the RW for support    Ambulation/Gait Ambulation/Gait assistance: Min Web designer (Feet): 25 Feet Assistive device: Rolling walker (2 wheels) Gait Pattern/deviations: Decreased step length - right;Decreased step length - left;Trunk flexed;Step-through pattern Gait velocity: decreased     General Gait Details: Min verbal cues for amb closer to the RW with upright posture with pt requiring min A for stability   Stairs             Wheelchair Mobility    Modified Rankin (Stroke Patients Only)       Balance Overall balance assessment: Needs assistance Sitting-balance support: Feet supported Sitting balance-Leahy Scale: Fair     Standing balance support: During functional activity;Bilateral upper extremity supported Standing balance-Leahy Scale: Poor                              Cognition Arousal/Alertness: Awake/alert Behavior During Therapy: WFL for tasks assessed/performed Overall Cognitive Status: Within Functional Limits for tasks assessed                                          Exercises Total Joint Exercises Ankle Circles/Pumps: AROM;Strengthening;Both;10 reps Quad Sets: Strengthening;Both;10 reps Heel Slides: Strengthening;Both;5 reps Hip ABduction/ADduction: Strengthening;Both;5 reps Straight Leg Raises: Strengthening;Both;5 reps Long Arc Quad: Strengthening;Both;10 reps Knee Flexion: Strengthening;Both;10 reps    General Comments  Pertinent Vitals/Pain Pain Assessment: No/denies pain    Home Living                          Prior Function            PT Goals (current goals can now be found in the care plan section) Progress towards PT goals: Progressing toward goals    Frequency    Min  2X/week      PT Plan Current plan remains appropriate    Co-evaluation              AM-PAC PT "6 Clicks" Mobility   Outcome Measure  Help needed turning from your back to your side while in a flat bed without using bedrails?: A Little Help needed moving from lying on your back to sitting on the side of a flat bed without using bedrails?: A Little Help needed moving to and from a bed to a chair (including a wheelchair)?: A Little Help needed standing up from a chair using your arms (e.g., wheelchair or bedside chair)?: A Little Help needed to walk in hospital room?: A Little Help needed climbing 3-5 steps with a railing? : Total 6 Click Score: 16    End of Session Equipment Utilized During Treatment: Oxygen Activity Tolerance: Patient tolerated treatment well Patient left: in bed;with call bell/phone within reach;with bed alarm set Nurse Communication: Mobility status PT Visit Diagnosis: Unsteadiness on feet (R26.81);Muscle weakness (generalized) (M62.81);Difficulty in walking, not elsewhere classified (R26.2);Other abnormalities of gait and mobility (R26.89)     Time: 6269-4854 PT Time Calculation (min) (ACUTE ONLY): 30 min  Charges:  $Gait Training: 8-22 mins $Therapeutic Exercise: 8-22 mins                     D. Scott Veryl Winemiller PT, DPT 06/29/21, 3:52 PM

## 2021-06-29 NOTE — Progress Notes (Signed)
NAME:  Louis Stone, MRN:  [id number], DOB:  Nov 09, 1950, LOS: 8 ADMISSION DATE:  06/21/2021, INITIAL CONSULTATION DATE:  06/22/2021, RE CONSULT DATE: 06/25/21 REFERRING MD:  Nita Sells, MD, CHIEF COMPLAINT:  Hypoxia   Brief Patient Description  70yo male w/ a past medical history of oropharynx cancer s/p G-tube placement, Type II diabetes, HLD, GERD and former smoker who was admitted to hospitalist service with acute hypoxic respiratory failure secondary to suspected aspiration pneumonia.   ED COURSE: Per the medical record, the patient has been having these symptoms for a few days. Denies fevers. ED workup revealed: elevated glucose of 500, WBC 11.4, Cr 1.58, Na 153, chest xray which revealed right lower lobe infiltrate concerning for aspiration pneumonia and initially hypoxic w/sats of 88% that improved with 2L [place]. The patients received 1L IVFs, unasyn and was started on IVFs at 75cc/hr. Cultures sent. COVID and Flu negative. PCCM consulted on 12/19 due to worsening hypoxia  HOSPITAL COURSE: CTA  was obtained  which was negative for PE but showed pneumoperitoneum.  Surgery consulted, free air felt to be due to PEG tube.  Nephrology consulted for AKI and Hypernatremia 12/20: Weaned to 3L [place].  AKI and Hypernatremia improving. Patient transferred to [facility] unit. On 12/21, patient was requiring less oxygen and was transitioned from Unasyn to Augmentin. On 12/22, patient transferred to PCU on 13L HFNC, however decompensated further with increased oxygen needs requiring transfer to stepdown. PCCM re-consulted due to high risk for decompensation  Pertinent  Medical History  Type II Diabetes Oropharynx cancer s/p Gtube placement Hyperlipidemia Hypertension  Significant Hospital Events: Including procedures, antibiotic start and stop dates in addition to other pertinent events   12/19: Admitted. Optiflow. CTA negative for PE, did show pneumoperitoneum.  Surgery consulted, free air felt  to be due to PEG tube.  Nephrology consulted for AKI and Hypernatremia 12/20: Weaned to 3L [place].  AKI and Hypernatremia improving. Patient transferred to [facility] unit 12/21: Requiring less oxygen on [place]. Transitioned from Unasyn to Augmentin 12/22: Patient transferred to PCU on 13L HFNC, however decompensated further with increased oxygen needs requiring transfer to stepdown. PCCM re-consulted due to high risk for decompensation. 12/24: On high flow O2, MetaNeb for pulmonary hygiene  12/26: Pt improved, Weaned down to 3L [place].  PCCM to sign off  Microbiology Results:  12/18: SARS-CoV-2 PCR>> negative 12/18: Influenza PCR>> negative 06/21/21: Strep pneumo urinary antigen>>negative 06/21/21: Blood culture>> 1/2 with Staph Hominis + Haemolyticus  12/18: MRSA PCR>> negative 12/22: Blood culture x2>>No growth thus far 12/22: Urine Culture>>no growth   Antimicrobials:  Unasyn 12/18 >> stop 12/22 Augmentin 12/21 >> 12/22 Cefepime 12/22>>12/24 Vancomycin 12/22>>12/24 Zosyn (infusion) 12/24>>  Interim History / Subjective:  -No significant events noted overnight -Afebrile, hemodynamically stable, no complaints -Continues to improve, Weaned down to 3L   -Denies chest pain, SOB, abdominal pain, N/V/D -Doing well with MetaNeb -Critical Care needs resolved and FiO2 requirements much improved ~ PCCM will sign off   OBJECTIVE   Blood pressure 120/67, pulse 72, temperature 98 F (36.7 C), temperature source Oral, resp. rate (!) 23, height 5\' 5"  (1.651 m), weight 54.6 kg, SpO2 98 %.        Intake/Output Summary (Last 24 hours) at 06/29/2021 1208 Last data filed at 06/29/2021 1000 Gross per 24 hour  Intake 2498.17 ml  Output 1325 ml  Net 1173.17 ml    Filed Weights   06/21/21 1239 06/22/21 0331 06/25/21 1857  Weight: 54.4 kg 48.2 kg 54.6 kg  Physical Examination: GENERAL: Chronically ill-appearing gentleman, cachectic, no respiratory distress on nasal cannula EYES: Pupils equal,  round, reactive to light and accommodation. No scleral icterus.  HEENT: Head atraumatic, normocephalic. Oropharynx and nasopharynx clear.  Temporal wasting. NECK:  Supple, no jugular venous distention.  Trachea midline. LUNGS: Decreased  breath sounds bilaterally, no wheezing, rales/crackles on right. No use of accessory muscles of respiration.  CARDIOVASCULAR: S1, S2 normal. No murmurs, rubs, or gallops.  ABDOMEN: Soft, nontender, nondistended. Bowel sounds present. No organomegaly or mass. PEG+ EXTREMITIES: No pedal edema, cyanosis, or clubbing.  NEUROLOGIC: No overt focal deficit. PSYCHIATRIC: The patient is alert and oriented x 3.  SKIN: No obvious rash, lesion, or ulcer.    Labs   CBC: Recent Labs  Lab 06/25/21 1937 06/26/21 0409 06/27/21 0417 06/28/21 0529 06/29/21 0502  WBC 14.7* 17.1* 19.5* 15.9* 11.0*  NEUTROABS 13.5* 14.9* 18.2*  --   --   HGB 13.0 12.5* 12.2* 11.6* 10.6*  HCT 41.6 39.6 38.8* 37.0* 34.6*  MCV 82.1 81.1 81.7 80.6 80.7  PLT 168 169 216 199 235     Basic Metabolic Panel: Recent Labs  Lab 06/23/21 0521 06/23/21 1158 06/24/21 0435 06/24/21 1439 06/25/21 1937 06/26/21 0409 06/27/21 0417 06/27/21 1605 06/28/21 0529 06/29/21 0502  NA 154*   < > 153*   < > 146* 147* 148* 146* 145 144  K 3.5  --  3.6   < > 3.5 3.7 3.1*  --  4.0 3.7  CL 119*  --  116*   < > 104 107 109  --  107 106  CO2 28  --  31   < > 32 30 31  --  29 33*  GLUCOSE 390*   < > 135*   < > 143* 109* 200*  --  178* 134*  BUN 38*  --  30*   < > 23 27* 48*  --  46* 33*  CREATININE 1.11  --  0.99   < > 0.95 0.99 0.94  --  0.88 0.93  CALCIUM 9.1  --  9.0   < > 9.0 8.9 9.3  --  8.8* 9.0  MG 2.6*  --  2.4  --  2.4 2.5* 2.9*  --  2.8* 2.7*  PHOS 2.4*  --  3.3  --   --   --  2.4*  --  3.3 3.3   < > = values in this interval not displayed.    GFR: Estimated Creatinine Clearance: 57.9 mL/min (by C-G formula based on SCr of 0.93 mg/dL). Recent Labs  Lab 06/25/21 1937 06/26/21 0409  06/27/21 0417 06/28/21 0529 06/29/21 0502  PROCALCITON 3.21 15.54 14.18  --   --   WBC 14.7* 17.1* 19.5* 15.9* 11.0*  LATICACIDVEN 2.7*  --   --   --   --      Liver Function Tests: Recent Labs  Lab 06/26/21 0409 06/27/21 0417 06/28/21 0529  AST 73* 391* 319*  ALT 87* 293* 333*  ALKPHOS 109 126 119  BILITOT 1.0 0.6 0.6  PROT 7.3 7.1 6.6  ALBUMIN 2.4* 2.0* 1.9*    No results for input(s): LIPASE, AMYLASE in the last 168 hours. No results for input(s): AMMONIA in the last 168 hours.  ABG    Component Value Date/Time   PHART 7.44 06/27/2021 1115   PCO2ART 51 (H) 06/27/2021 1115   PO2ART 51 (L) 06/27/2021 1115   HCO3 34.6 (H) 06/27/2021 1115   TCO2 35 (H) 07/17/2020 0038  ACIDBASEDEF 1.3 02/01/2021 1209   O2SAT 87.1 06/27/2021 1115      Coagulation Profile: Recent Labs  Lab 06/27/21 0932  INR 1.1     Cardiac Enzymes: Recent Labs  Lab 06/27/21 0932  CKTOTAL 77     HbA1C: Hgb A1c MFr Bld  Date/Time Value Ref Range Status  06/23/2021 11:58 AM 8.3 (H) 4.8 - 5.6 % Final    Comment:    (NOTE)         Prediabetes: 5.7 - 6.4         Diabetes: >6.4         Glycemic control for adults with diabetes: <7.0   02/02/2021 12:38 AM 6.8 (H) 4.8 - 5.6 % Final    Comment:    (NOTE) Pre diabetes:          5.7%-6.4%  Diabetes:              >6.4%  Glycemic control for   <7.0% adults with diabetes     CBG: Recent Labs  Lab 06/28/21 1959 06/29/21 0004 06/29/21 0359 06/29/21 0727 06/29/21 1103  GLUCAP 143* 160* 139* 104* 86   BNP (last 3 results) Recent Labs    06/21/21 1244 06/25/21 1937 06/27/21 0417  BNP 107.5* 174.2* 83.9      Allergies No Known Allergies   Scheduled Meds:  arformoterol  15 mcg Nebulization BID   budesonide (PULMICORT) nebulizer solution  0.25 mg Nebulization BID   chlorhexidine  15 mL Mouth Rinse BID   Chlorhexidine Gluconate Cloth  6 each Topical Q0600   enoxaparin (LOVENOX) injection  40 mg Subcutaneous QHS    famotidine  20 mg Per Tube Daily   free water  150 mL Per Tube Q4H   insulin aspart  0-20 Units Subcutaneous Q4H   insulin aspart  3 Units Subcutaneous Q4H   insulin detemir  15 Units Subcutaneous Q12H   mouth rinse  15 mL Mouth Rinse q12n4p   metoprolol tartrate  6.25 mg Per Tube BID   revefenacin  175 mcg Nebulization Daily   Continuous Infusions:  sodium chloride Stopped (06/29/21 0922)   albumin human 25 g (06/29/21 0905)   dextrose 40 mL/hr at 06/29/21 1000   feeding supplement (OSMOLITE 1.5 CAL) 60 mL/hr at 06/29/21 1109   piperacillin-tazobactam (ZOSYN)  IV 12.5 mL/hr at 06/29/21 1000   PRN Meds:.sodium chloride, acetaminophen (TYLENOL) oral liquid 160 mg/5 mL, albuterol, guaiFENesin, metoprolol tartrate, ondansetron (ZOFRAN) IV  ASSESSMENT & PLAN   Acute Hypoxic Respiratory Failure secondary to multilobar Pneumonia with possible Aspiration and Mucus Plugging PMHx: Emphysema with prior smoking -Supplemental O2 to maintain SpO2 >88% -Continue BIPAP/HHFNC at nighttime, wean FiO2 as tolerated -Intermittent chest x-ray & ABG PRN -Continue MetaNeb for pulmonary hygiene -F/u cultures, trend PCT -Monitor WBC/ fever curve -Budesonide inhaler nebs BID, Brovana and Yupelri nebs -continue Zosyn  Acute Kidney Injury ~improved Hypernatremia~resolved Lactic Acidosis -Monitor I&O's / urinary output -Follow BMP -Ensure adequate renal perfusion -Avoid nephrotoxic agents as able -Replace electrolytes as indicated   Type II Diabetes~Poorly controlled, last HgbA1c 6.8% Hyperglycemia -CBG's q4h; Target range of 140 to 180 -SSI and Glargine -Follow ICU Hypo/Hyperglycemia protocol  Transaminitis Hepatitis profile Query shock liver from decompensation 12/22 Trend liver enzymes Monitor Avoid hepatotoxic agents   Hypertension Hyperlipidemia -Home medications: Lipitor and Metoprolol   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 CT abdomen pelvis, small areas of  pneumoperitoneum present, evalauted by general surgery with plans for serial abdominal xrays to monitor for changes -  Ok to use PEG per surgery as of 12/19 -Tube feeds resumed by primary  Severe protein calorie malnutrition -Continue tube feeds -Albumin supplementation -Albumin 1.9   Best practice (right click and "Reselect all SmartList Selections" daily)  Diet:  Tube Feed  Pain/Anxiety/Delirium protocol (if indicated): No VAP protocol (if indicated): Not indicated DVT prophylaxis: LMWH GI prophylaxis: H2B Glucose control:  SSI Yes and Basal insulin Yes Central venous access:  N/A Arterial line:  N/A Foley:  N/A Mobility:  bed rest  PT consulted: N/A Last date of multidisciplinary goals of care discussion [12/26] Code Status:  full code Disposition: Stepdown  Level 3 follow-up    Prognosis is guarded overall.  Palliative care following patient.  Darel Hong, AGACNP-BC [facility] Prefer epic messenger for cross cover needs If after hours, please call E-link

## 2021-06-29 NOTE — TOC Progression Note (Signed)
Transition of Care [facility]) - Progression Note    Patient Details  Name: Louis Stone MRN: [id number] Date of Birth: 1950/09/28  Transition of Care [facility]) CM/SW [place], RN Phone Number: 06/29/2021, 1:43 PM  Clinical Narrative:  Noted improvement in patient's condition, may consider transfer off the unit.          Expected Discharge Plan and Services                                                 Social Determinants of Health (SDOH) Interventions    Readmission Risk Interventions Readmission Risk Prevention Plan 02/03/2021  Transportation Screening Complete  PCP or Specialist Appt within 3-5 Days Complete  HRI or [place] Complete  Social Work Consult for [place] Planning/Counseling Complete  Palliative Care Screening Not Applicable  Medication Review Press photographer) Complete  Some recent data might be hidden

## 2021-06-29 NOTE — Progress Notes (Signed)
PROGRESS NOTE   Louis Stone  ZOX:[id number] DOB: 1951-01-23 DOA: 06/21/2021 PCP: Theotis Burrow, MD  Brief Narrative:   70 year old community dwelling black male History TN2 SCC oral cancer 2009-aspiration pneumonia + severe dysphagia on 03/12/2021 admission-was using PEG tube at the time  Recent admission 12/2 through 06/09/2021 with profound hypoglycemia discharged with home health Return to emergency room by EMS with CBG over 500 and was disoriented WBC 11.4 anion gap 9 CXR right lower lobe infiltrate CT head negative for stroke  Patient has lost 20 pounds was using Ensure bought from store [supposed to be on jevity]  Developed oxygen requirement-ultimately placed on BiPAP Found on scans 12/19 with pneumoperitoneum--General surgery consulted but this was felt to be possibly air leakage around G-tube  Nephrology consulted in addition Decompensated 12/22 with worsening respiratory failure requiring transfer to [facility] and Pulmonology was consulted additionally  12/25 ON TF. Doing better today. Yesterday 02 apparently had dropped.  12/26 no overnight issues.   Hospital-Problem based course  1.Acute hypoxic respiratory failure due to aspiration pneumonia Sepsis -ID consulted-Staph hominis and hemolyticus are 2 skin bacteria in the blood culture and are contaminants- no reason to suspect endocarditis- no need for TEE or treatment  Rec.discontinuing vancomycin since MRSA naris negative and hence MRSA pneumonia unlikely.  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     4.Hypernatremia      Likely free water deficit      Dextrose infusion was given    112/26 resolved, levels stable     5.Poorly controlled diabetes mellitus with DKA this admission and profound hypoglycemia last admission A1c 6.8  BG stable  Continue lantus and RISS       6.AKI  Improved   7.12 beats of V. tach 0n 12/23    On beta block Mg stable Echo nml ef Cards following       DVT prophylaxis: Lovenox Code Status: Full Family Communication: none at bedside  Disposition:  Status is: Inpatient  Remains inpatient appropriate because: Still hypernatremic  Consultants:  Multiple  Procedures:   Antimicrobials:   Currently Unasyn   Subjective: No complaints. Denies dizziness, worsening shortness of breath, chest pain   Objective: Vitals:   06/29/21 0800 06/29/21 0900 06/29/21 1045 06/29/21 1200  BP: 120/67   127/63  Pulse:  72  (!) 59  Resp:  (!) 23  (!) 22  Temp:      TempSrc:      SpO2:  98% 98% 97%  Weight:      Height:        Intake/Output Summary (Last 24 hours) at 06/29/2021 1446 Last data filed at 06/29/2021 1300 Gross per 24 hour  Intake 2465.53 ml  Output 925 ml  Net 1540.53 ml   Filed Weights   06/21/21 1239 06/22/21 0331 06/25/21 1857  Weight: 54.4 kg 48.2 kg 54.6 kg    Examination: NAD, calm Overall clear to go auscultation, no wheezing Regular S1-S2 no gallops  Soft benign positive bowel sounds No edema Awake and oriented   Data Reviewed: personally reviewed   CBC    Component Value Date/Time   WBC 11.0 (H) 06/29/2021 0502   RBC 4.29 06/29/2021 0502   HGB 10.6 (L) 06/29/2021 0502   HCT 34.6 (L) 06/29/2021 0502   PLT 235 06/29/2021 0502   MCV 80.7 06/29/2021 0502   MCH 24.7 (L) 06/29/2021 0502   MCHC 30.6 06/29/2021 0502   RDW  14.8 06/29/2021 0502   LYMPHSABS 0.5 (L) 06/27/2021 0417   MONOABS 0.6 06/27/2021 0417   EOSABS 0.0 06/27/2021 0417   BASOSABS 0.0 06/27/2021 0417   CMP Latest Ref Rng & Units 06/29/2021 06/28/2021 06/27/2021  Glucose 70 - 99 mg/dL 134(H) 178(H) -  BUN 8 - 23 mg/dL 33(H) 46(H) -  Creatinine 0.61 - 1.24 mg/dL 0.93 0.88 -  Sodium 135 - 145 mmol/L 144 145 146(H)  Potassium 3.5 - 5.1 mmol/L 3.7 4.0 -  Chloride 98 - 111 mmol/L 106 107 -  CO2 22 - 32 mmol/L 33(H) 29 -  Calcium 8.9 - 10.3 mg/dL 9.0 8.8(L) -  Total Protein 6.5 - 8.1 g/dL - 6.6 -  Total Bilirubin 0.3 - 1.2 mg/dL - 0.6 -  Alkaline Phos 38 - 126 U/L - 119 -  AST 15 - 41 U/L - 319(H) -  ALT 0 - 44 U/L - 333(H) -     Radiology Studies: DG Chest Port 1 View  Result Date: 06/29/2021 CLINICAL DATA:  Pneumonia EXAM: PORTABLE CHEST 1 VIEW COMPARISON:  06/27/2021 FINDINGS: Patchy bilateral lower lobe opacities, compatible with pneumonia, mildly progressive. No pleural effusion or pneumothorax. The heart is normal in size.  Thoracic aortic atherosclerosis. IMPRESSION: Bilateral lower lobe pneumonia, mildly progressive. Electronically Signed   By: Julian Hy M.D.   On: 06/29/2021 05:38   US Abdomen Limited RUQ (LIVER/GB)  Result Date: 06/27/2021 CLINICAL DATA:  Transaminitis. EXAM: ULTRASOUND ABDOMEN LIMITED RIGHT UPPER QUADRANT COMPARISON:  CT 06/22/2021 FINDINGS: Gallbladder: Numerous stones within the gallbladder, the largest 1.4 cm. These appear to be mobile. No Murphy sign. No wall thickening. No surrounding fluid. Common bile duct: Diameter: 3 mm.  Normal. Liver: No focal lesion identified. Within normal limits in parenchymal echogenicity. Portal vein is patent on color Doppler imaging with normal direction of blood flow towards the liver. Other: None. IMPRESSION: Mobile stones within the gallbladder, the largest 1.4 cm. No sonographic evidence of cholecystitis or obstruction however. Electronically Signed   By: Nelson Chimes  M.D.   On: 06/27/2021 15:38     Scheduled Meds:  arformoterol  15 mcg Nebulization BID   budesonide (PULMICORT) nebulizer solution  0.25 mg Nebulization BID   chlorhexidine  15 mL Mouth Rinse BID   Chlorhexidine Gluconate Cloth  6 each Topical Q0600   enoxaparin (LOVENOX) injection  40 mg Subcutaneous QHS   famotidine  20 mg Per Tube Daily   free water  150 mL Per Tube Q4H   insulin aspart  0-20 Units Subcutaneous Q4H   insulin aspart  3 Units Subcutaneous Q4H   insulin detemir  15 Units Subcutaneous Q12H   mouth rinse  15 mL Mouth Rinse q12n4p   metoprolol tartrate  6.25 mg Per Tube BID   revefenacin  175 mcg Nebulization Daily   Continuous Infusions:  sodium chloride Stopped (06/29/21 0922)   albumin human Stopped (06/29/21 1034)   dextrose Stopped (06/29/21 1105)   feeding supplement (OSMOLITE 1.5 CAL) 60 mL/hr at 06/29/21 1109  piperacillin-tazobactam (ZOSYN)  IV 12.5 mL/hr at 06/29/21 1300     LOS: 8 days   Time spent: 35 minutes with more than 50% on COC  Nolberto Hanlon, MD [facility] To contact the attending provider between 7A-7P or the covering provider during after hours 7P-7A, please log into the web site [email or web address] and access using universal [place] password for that web site. If you do not have the password, please call the hospital operator.  06/29/2021, 2:46 PM

## 2021-06-30 DIAGNOSIS — J9601 Acute respiratory failure with hypoxia: Secondary | ICD-10-CM | POA: Diagnosis not present

## 2021-06-30 DIAGNOSIS — N179 Acute kidney failure, unspecified: Secondary | ICD-10-CM | POA: Diagnosis not present

## 2021-06-30 DIAGNOSIS — G9341 Metabolic encephalopathy: Secondary | ICD-10-CM | POA: Diagnosis not present

## 2021-06-30 DIAGNOSIS — J690 Pneumonitis due to inhalation of food and vomit: Secondary | ICD-10-CM | POA: Diagnosis not present

## 2021-06-30 LAB — GLUCOSE, CAPILLARY
Glucose-Capillary: 143 mg/dL — ABNORMAL HIGH (ref 70–99)
Glucose-Capillary: 143 mg/dL — ABNORMAL HIGH (ref 70–99)
Glucose-Capillary: 163 mg/dL — ABNORMAL HIGH (ref 70–99)
Glucose-Capillary: 185 mg/dL — ABNORMAL HIGH (ref 70–99)
Glucose-Capillary: 193 mg/dL — ABNORMAL HIGH (ref 70–99)
Glucose-Capillary: 75 mg/dL (ref 70–99)

## 2021-06-30 LAB — CULTURE, BLOOD (ROUTINE X 2)
Culture: NO GROWTH
Culture: NO GROWTH
Special Requests: ADEQUATE
Special Requests: ADEQUATE

## 2021-06-30 NOTE — Progress Notes (Signed)
[facility] Cardiology    SUBJECTIVE: Patient still slightly confused denies any worsening shortness of breath denies any chest pain no palpitations or tachycardia   Vitals:   06/30/21 0400 06/30/21 0500 06/30/21 0540 06/30/21 0600  BP: 112/61     Pulse: 84 81 76 84  Resp: 18 19 (!) 21 20  Temp:   98.6 F (37 C)   TempSrc:   Oral   SpO2: 96% 95% 97% 94%  Weight:      Height:         Intake/Output Summary (Last 24 hours) at 06/30/2021 0715 Last data filed at 06/30/2021 5361 Gross per 24 hour  Intake 1979.61 ml  Output 1200 ml  Net 779.61 ml      PHYSICAL EXAM  General: Well developed, well nourished, in no acute distress HEENT:  Normocephalic and atramatic Neck:  No JVD.  Lungs: Clear bilaterally to auscultation and percussion. Heart: HRRR . Normal S1 and S2 without gallops or murmurs.  Abdomen: Bowel sounds are positive, abdomen soft and non-tender  Msk:  Back normal, normal gait. Normal strength and tone for age. Extremities: No clubbing, cyanosis or edema.   Neuro: Alert and oriented X 3. Psych:  Good affect, responds appropriately   LABS: Basic Metabolic Panel: Recent Labs    06/28/21 0529 06/29/21 0502  NA 145 144  K 4.0 3.7  CL 107 106  CO2 29 33*  GLUCOSE 178* 134*  BUN 46* 33*  CREATININE 0.88 0.93  CALCIUM 8.8* 9.0  MG 2.8* 2.7*  PHOS 3.3 3.3   Liver Function Tests: Recent Labs    06/28/21 0529  AST 319*  ALT 333*  ALKPHOS 119  BILITOT 0.6  PROT 6.6  ALBUMIN 1.9*   No results for input(s): LIPASE, AMYLASE in the last 72 hours. CBC: Recent Labs    06/28/21 0529 06/29/21 0502  WBC 15.9* 11.0*  HGB 11.6* 10.6*  HCT 37.0* 34.6*  MCV 80.6 80.7  PLT 199 235   Cardiac Enzymes: Recent Labs    06/27/21 0932  CKTOTAL 77   BNP: Invalid input(s): POCBNP D-Dimer: No results for input(s): DDIMER in the last 72 hours. Hemoglobin A1C: No results for input(s): HGBA1C in the last 72 hours. Fasting Lipid Panel: No results for input(s):  CHOL, HDL, LDLCALC, TRIG, CHOLHDL, LDLDIRECT in the last 72 hours. Thyroid Function Tests: No results for input(s): TSH, T4TOTAL, T3FREE, THYROIDAB in the last 72 hours.  Invalid input(s): FREET3 Anemia Panel: No results for input(s): VITAMINB12, FOLATE, FERRITIN, TIBC, IRON, RETICCTPCT in the last 72 hours.  DG Chest Port 1 View  Result Date: 06/29/2021 CLINICAL DATA:  Pneumonia EXAM: PORTABLE CHEST 1 VIEW COMPARISON:  06/27/2021 FINDINGS: Patchy bilateral lower lobe opacities, compatible with pneumonia, mildly progressive. No pleural effusion or pneumothorax. The heart is normal in size.  Thoracic aortic atherosclerosis. IMPRESSION: Bilateral lower lobe pneumonia, mildly progressive. Electronically Signed   By: Julian Hy M.D.   On: 06/29/2021 05:38     Echo preserved left ventricular function  TELEMETRY: Normal sinus rhythm nonspecific T changes rate of around 90:  ASSESSMENT AND PLAN:  Principal Problem:   Aspiration pneumonia ([place]) Active Problems:   Acute metabolic encephalopathy   GERD without esophagitis   Acute respiratory failure with hypoxia (HCC)   Squamous cell carcinoma of oropharynx (HCC)   Acute renal failure (ARF) (HCC)   Protein-calorie malnutrition, severe   Diabetes mellitus without complication (HCC)   Dehydration   Hypernatremia   HLD (hyperlipidemia)   Acute  respiratory failure with hypoxemia (HCC)   Pneumoperitoneum of unknown etiology    Plan Reported episodes of nonsustained VT resolved no further therapy necessary Continue therapy for acute metabolic encephalopathy Broad-spectrum antibiotic therapy for possible aspiration pneumonia Continue adequate hydration Supplemental oxygen as necessary Continue PPI therapy as necessary No direct cardiac intervention indicated at this point Will sign off case reconsult cardiology if necessary   Yolonda Kida, MD, 06/30/2021 7:15 AM

## 2021-06-30 NOTE — Progress Notes (Signed)
PROGRESS NOTE   Louis Stone  OJJ:[id number] DOB: 06/15/1951 DOA: 06/21/2021 PCP: Theotis Burrow, MD  Brief Narrative:   70 year old community dwelling black male History TN2 SCC oral cancer 2009-aspiration pneumonia + severe dysphagia on 03/12/2021 admission-was using PEG tube at the time  Recent admission 12/2 through 06/09/2021 with profound hypoglycemia discharged with home health Return to emergency room by EMS with CBG over 500 and was disoriented WBC 11.4 anion gap 9 CXR right lower lobe infiltrate CT head negative for stroke Patient has lost 20 pounds was using Ensure bought from store [supposed to be on jevity]  Hospital events: 12/19: Admitted. Optiflow. CTA negative for PE, did show pneumoperitoneum.  Surgery consulted, free air felt to be due to PEG tube.  Nephrology consulted for AKI and Hypernatremia 12/20: Weaned to 3L [place].  AKI and Hypernatremia improving. Patient transferred to [facility] unit 12/21: Requiring less oxygen on [place]. Transitioned from Unasyn to Augmentin 12/22: Patient transferred to PCU on 13L HFNC, however decompensated further with increased oxygen needs requiring transfer to stepdown. PCCM re-consulted due to high risk for decompensation. 12/24: On high flow O2, MetaNeb for pulmonary hygiene  12/26: Pt improved, Weaned down to 3L [place].  PCCM to sign off 12/27 02 weaned to 1L today. Has no complaints. No issues  Hospital-Problem based course  1.Acute hypoxic respiratory failure due to aspiration pneumonia Sepsis -ID consulted-Staph hominis and hemolyticus are 2 skin bacteria in the blood culture and are contaminants- no reason to suspect endocarditis- no need for TEE or treatment  Rec.discontinuing vancomycin since MRSA naris negative and hence MRSA pneumonia unlikely.  12/27 continue zosyn for anaerobic coverage. Has been weaned from HFNC to 1L  [place].       2.hx/oEmphysema with prior smoking Transferred to SDU 12/22 critical care  consulted Antibiotics broadened to cefepime and vancomycin--procalcitonin 3.2 --->15 do not de-escalate antibiotics at this time  Blood recultured-12/22-follow result-attempt sputum culture  follow urine culture 12/22 12/27 continue Brovana, pulmicort and nebs Continue bipap/HFNC overnight as needed Steroids were discontinued.      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 Remains stable levels     5.Poorly controlled diabetes mellitus with DKA this admission and profound hypoglycemia last admission A1c 6.8  BG stable  Continue lantus and RISS       6.AKI  Improved   7.12 beats of V. tach 0n 12/23    On beta block Mg stable Echo nml ef Cards following-conservative mx. No further w/u. F/u with cards as outpt       DVT prophylaxis: Lovenox Code Status: Full Family Communication: none at bedside  Disposition:  Status is: Inpatient  Remains inpatient appropriate because: IV treatment Will need SNF placement  Consultants:  Multiple  Procedures:   Antimicrobials:   Unasyn 12/18 >> stop 12/22 Augmentin 12/21 >> 12/22 Cefepime 12/22>>12/24 Vancomycin 12/22>>12/24 Zosyn (infusion) 12/24>>  12/18: SARS-CoV-2 PCR>> negative 12/18: Influenza PCR>> negative 06/21/21: Strep pneumo urinary antigen>>negative 06/21/21: Blood culture>> 1/2 with Staph Hominis + Haemolyticus  12/18: MRSA PCR>> negative 12/22: Blood culture x2>>No growth thus far 12/22: Urine Culture>>no growth    Subjective: Denies worsening sob, cp, dizziness, or any other complaints   Objective: Vitals:   06/30/21 0540 06/30/21 0600 06/30/21 0700 06/30/21 0832  BP:      Pulse: 76 84 73  Resp: (!) 21 20 (!) 22   Temp: 98.6 F (37 C)     TempSrc: Oral     SpO2: 97% 94% 98% 99%  Weight:      Height:        Intake/Output Summary (Last 24  hours) at 06/30/2021 0837 Last data filed at 06/30/2021 0700 Gross per 24 hour  Intake 2768.95 ml  Output 1200 ml  Net 1568.95 ml   Filed Weights   06/21/21 1239 06/22/21 0331 06/25/21 1857  Weight: 54.4 kg 48.2 kg 54.6 kg    Examination: Calm,  nad Cta no wheezing Reg s1/s2 no gallop Soft benign +bs No edema aaxoxo3  Data Reviewed: personally reviewed   CBC    Component Value Date/Time   WBC 11.0 (H) 06/29/2021 0502   RBC 4.29 06/29/2021 0502   HGB 10.6 (L) 06/29/2021 0502   HCT 34.6 (L) 06/29/2021 0502   PLT 235 06/29/2021 0502   MCV 80.7 06/29/2021 0502   MCH 24.7 (L) 06/29/2021 0502   MCHC 30.6 06/29/2021 0502   RDW 14.8 06/29/2021 0502   LYMPHSABS 0.5 (L) 06/27/2021 0417   MONOABS 0.6 06/27/2021 0417   EOSABS 0.0 06/27/2021 0417   BASOSABS 0.0 06/27/2021 0417   CMP Latest Ref Rng & Units 06/29/2021 06/28/2021 06/27/2021  Glucose 70 - 99 mg/dL 134(H) 178(H) -  BUN 8 - 23 mg/dL 33(H) 46(H) -  Creatinine 0.61 - 1.24 mg/dL 0.93 0.88 -  Sodium 135 - 145 mmol/L 144 145 146(H)  Potassium 3.5 - 5.1 mmol/L 3.7 4.0 -  Chloride 98 - 111 mmol/L 106 107 -  CO2 22 - 32 mmol/L 33(H) 29 -  Calcium 8.9 - 10.3 mg/dL 9.0 8.8(L) -  Total Protein 6.5 - 8.1 g/dL - 6.6 -  Total Bilirubin 0.3 - 1.2 mg/dL - 0.6 -  Alkaline Phos 38 - 126 U/L - 119 -  AST 15 - 41 U/L - 319(H) -  ALT 0 - 44 U/L - 333(H) -     Radiology Studies: DG Chest Port 1 View  Result Date: 06/29/2021 CLINICAL DATA:  Pneumonia EXAM: PORTABLE CHEST 1 VIEW COMPARISON:  06/27/2021 FINDINGS: Patchy bilateral lower lobe opacities, compatible with pneumonia, mildly progressive. No pleural effusion or pneumothorax. The heart is normal in size.  Thoracic aortic atherosclerosis. IMPRESSION: Bilateral lower lobe pneumonia, mildly progressive. Electronically Signed   By: Julian Hy M.D.   On: 06/29/2021 05:38     Scheduled Meds:  arformoterol  15 mcg Nebulization BID   budesonide (PULMICORT) nebulizer  solution  0.25 mg Nebulization BID   chlorhexidine  15 mL Mouth Rinse BID   Chlorhexidine Gluconate Cloth  6 each Topical Q0600   enoxaparin (LOVENOX) injection  40 mg Subcutaneous QHS   famotidine  20 mg Per Tube Daily   free water  150 mL Per Tube Q4H   insulin aspart  0-20 Units Subcutaneous Q4H   insulin aspart  3 Units Subcutaneous Q4H   insulin detemir  15 Units Subcutaneous Q12H   mouth rinse  15 mL Mouth Rinse q12n4p   metoprolol tartrate  6.25 mg Per Tube BID   revefenacin  175 mcg Nebulization Daily   Continuous Infusions:  sodium chloride Stopped (06/29/21 0922)   albumin human 25 g (06/29/21 2147)   dextrose Stopped (06/29/21 1105)   feeding supplement (OSMOLITE 1.5 CAL) 1,000 mL (06/30/21 0005)   piperacillin-tazobactam (ZOSYN)  IV 12.5 mL/hr at 06/30/21 0700     LOS: 9 days   Time  spent: 35 minutes with more than 50% on COC  Nolberto Hanlon, MD [facility] To contact the attending provider between 7A-7P or the covering provider during after hours 7P-7A, please log into the web site [email or web address] and access using universal [place] password for that web site. If you do not have the password, please call the hospital operator.  06/30/2021, 8:37 AM

## 2021-07-01 DIAGNOSIS — J9601 Acute respiratory failure with hypoxia: Secondary | ICD-10-CM | POA: Diagnosis not present

## 2021-07-01 LAB — GLUCOSE, CAPILLARY
Glucose-Capillary: 128 mg/dL — ABNORMAL HIGH (ref 70–99)
Glucose-Capillary: 129 mg/dL — ABNORMAL HIGH (ref 70–99)
Glucose-Capillary: 138 mg/dL — ABNORMAL HIGH (ref 70–99)
Glucose-Capillary: 196 mg/dL — ABNORMAL HIGH (ref 70–99)
Glucose-Capillary: 205 mg/dL — ABNORMAL HIGH (ref 70–99)
Glucose-Capillary: 205 mg/dL — ABNORMAL HIGH (ref 70–99)
Glucose-Capillary: 34 mg/dL — CL (ref 70–99)
Glucose-Capillary: 39 mg/dL — CL (ref 70–99)

## 2021-07-01 MED ORDER — INSULIN DETEMIR 100 UNIT/ML ~~LOC~~ SOLN
10.0000 [IU] | Freq: Two times a day (BID) | SUBCUTANEOUS | Status: DC
  Administered 2021-07-01 – 2021-07-04 (×7): 10 [IU] via SUBCUTANEOUS
  Filled 2021-07-01 (×9): qty 0.1

## 2021-07-01 MED ORDER — DEXTROSE 50 % IV SOLN: 25.0000 g | INTRAVENOUS | Status: AC

## 2021-07-01 MED ORDER — DEXTROSE 50 % IV SOLN
INTRAVENOUS | Status: AC
  Administered 2021-07-01: 16:00:00 25 g via INTRAVENOUS
  Filled 2021-07-01: qty 50

## 2021-07-01 NOTE — Progress Notes (Signed)
PT Cancellation Note  Patient Details Name: Louis Stone MRN: [id number] DOB: Mar 30, 1951   Cancelled Treatment:    Reason Eval/Treat Not Completed: Medical issues which prohibited therapy. Chart reviewed. Most recent labs indicate a glucose value of 34 followed by 39. These values are below the range safe for therapeutic activity. Will hold PT treatment this afternoon and attempt at later date/time.    Patrina Levering PT, DPT 07/01/21 3:53 PM [phone]

## 2021-07-01 NOTE — Progress Notes (Signed)
PROGRESS NOTE    Louis Stone  QVZ:[id number] DOB: [birth year] DOA: 06/21/2021 PCP: Theotis Burrow, MD    Brief Narrative:  70 y.o. male with medical history significant of oropharynx cancer, s/p of G-tube placement, diabetes mellitus, hyperlipidemia, GERD, former smoker, aspiration pneumonia, who presents with shortness breath and cough.Pt was admitted for acute hypoxemic failure from aspiration PNA  Assessment & Plan:   Principal Problem:   Aspiration pneumonia ([place]) Active Problems:   Acute metabolic encephalopathy   GERD without esophagitis   Acute respiratory failure with hypoxia (HCC)   Squamous cell carcinoma of oropharynx (HCC)   Acute renal failure (ARF) (HCC)   Protein-calorie malnutrition, severe   Diabetes mellitus without complication (HCC)   Dehydration   Hypernatremia   HLD (hyperlipidemia)   Acute respiratory failure with hypoxemia (HCC)   Pneumoperitoneum of unknown etiology  1.Acute hypoxic respiratory failure due to aspiration pneumonia Sepsis -ID consulted-Staph hominis and hemolyticus are 2 skin bacteria in the blood culture and are contaminants- no reason to suspect endocarditis- no need for TEE or treatment  Rec.discontinuing vancomycin since MRSA naris negative and hence MRSA pneumonia unlikely.  12/27 continue zosyn for anaerobic coverage. Has been weaned from HFNC to now minimal O2 support Continue with IS.   2.hx/oEmphysema with prior smoking Transferred to SDU 12/22 critical care consulted Antibiotics broadened to cefepime and vancomycin--procalcitonin 3.2 --->15 do not de-escalate antibiotics at this time  Blood recultured-12/22-follow result-attempt sputum culture  follow urine culture 12/22 12/27 continue Brovana, pulmicort and nebs Steroids were discontinued. Pt initially required high flow O2, now weaned to minimal o2 support    3.G-tube dependent Adult failure to thrive in the setting of cancer -continue TF as tolerated     4.Hypernatremia      Likely free water deficit     12/26 improved with dextrose infusion . Will repleat bmet in AM    5.Poorly controlled diabetes mellitus with DKA this admission and profound hypoglycemia last admission A1c 6.8 -Noted to have glucose in the 30's this afternoon -Have decreased QHS lantus to 10 units, d/c meal coverage -Continue SSI   6.AKI  Cr has normalized Repeat bmet in AM    7.12 beats of V. tach 0n 12/23    On beta block Mg stable Echo nml ef Cards following-conservative mx. No further w/u. F/u with cards as outpt    DVT prophylaxis: Lovenox subq Code Status: Full Family Communication: pt in room, family not at bedside  Status is: Inpatient  Remains inpatient appropriate because: Severity of illness   Consultants:  PCCM ID Palliative Care Nephrology General Surgery  Procedures:    Antimicrobials: Anti-infectives (From admission, onward)    Start     Dose/Rate Route Frequency Ordered Stop   06/27/21 1800  piperacillin-tazobactam (ZOSYN) IVPB 3.375 g        3.375 g 12.5 mL/hr over 240 Minutes Intravenous Every 8 hours 06/27/21 1715     06/26/21 2200  vancomycin (VANCOCIN) IVPB 1000 mg/200 mL premix  Status:  Discontinued        1,000 mg 200 mL/hr over 60 Minutes Intravenous Every 24 hours 06/25/21 2019 06/27/21 1710   06/25/21 2100  vancomycin (VANCOREADY) IVPB 1250 mg/250 mL        1,250 mg 166.7 mL/hr over 90 Minutes Intravenous  Once 06/25/21 2006 06/26/21 0112   06/25/21 2030  ceFEPIme (MAXIPIME) 2 g in sodium chloride 0.9 % 100 mL IVPB  Status:  Discontinued        2  g 200 mL/hr over 30 Minutes Intravenous Every 12 hours 06/25/21 2006 06/27/21 1711   06/25/21 1500  Ampicillin-Sulbactam (UNASYN) 3 g in sodium chloride 0.9 % 100 mL IVPB  Status:  Discontinued        3 g 200 mL/hr over 30 Minutes Intravenous Every 6 hours 06/25/21 1356 06/25/21 1957   06/24/21 2000  amoxicillin-clavulanate (AUGMENTIN) 875-125 MG per tablet 1 tablet   Status:  Discontinued        1 tablet Per Tube Every 12 hours 06/24/21 1912 06/25/21 1353   06/24/21 1800  amoxicillin-clavulanate (AUGMENTIN) 875-125 MG per tablet 1 tablet  Status:  Discontinued        1 tablet Oral Every 12 hours 06/24/21 1635 06/24/21 1912   06/22/21 0300  ceFEPIme (MAXIPIME) 2 g in sodium chloride 0.9 % 100 mL IVPB  Status:  Discontinued        2 g 200 mL/hr over 30 Minutes Intravenous Every 12 hours 06/21/21 1526 06/21/21 1546   06/21/21 2200  Ampicillin-Sulbactam (UNASYN) 3 g in sodium chloride 0.9 % 100 mL IVPB  Status:  Discontinued        3 g 200 mL/hr over 30 Minutes Intravenous Every 6 hours 06/21/21 1551 06/24/21 1635   06/21/21 1600  metroNIDAZOLE (FLAGYL) IVPB 500 mg  Status:  Discontinued        500 mg 100 mL/hr over 60 Minutes Intravenous Every 12 hours 06/21/21 1511 06/21/21 1546   06/21/21 1500  vancomycin (VANCOCIN) IVPB 1000 mg/200 mL premix  Status:  Discontinued        1,000 mg 200 mL/hr over 60 Minutes Intravenous  Once 06/21/21 1447 06/21/21 1455   06/21/21 1500  ceFEPIme (MAXIPIME) 2 g in sodium chloride 0.9 % 100 mL IVPB        2 g 200 mL/hr over 30 Minutes Intravenous  Once 06/21/21 1447 06/21/21 1548   06/21/21 1500  vancomycin (VANCOREADY) IVPB 1250 mg/250 mL  Status:  Discontinued        1,250 mg 166.7 mL/hr over 90 Minutes Intravenous  Once 06/21/21 1455 06/21/21 1546       Subjective: Without complaints this AM. Denies SOB  Objective: Vitals:   07/01/21 0800 07/01/21 1200 07/01/21 1500 07/01/21 1600  BP: (!) 87/62 92/61  121/70  Pulse: 75 78 73 82  Resp: 15 18 17 14   Temp: 98.8 F (37.1 C)     TempSrc: Oral     SpO2: 100% 94% 100% 95%  Weight:      Height:        Intake/Output Summary (Last 24 hours) at 07/01/2021 1827 Last data filed at 07/01/2021 1800 Gross per 24 hour  Intake 1710 ml  Output 1100 ml  Net 610 ml   Filed Weights   06/21/21 1239 06/22/21 0331 06/25/21 1857  Weight: 54.4 kg 48.2 kg 54.6 kg     Examination: General exam: Awake, laying in bed, in nad Respiratory system: Normal respiratory effort, no wheezing Cardiovascular system: regular rate, s1, s2 Gastrointestinal system: Soft, nondistended, positive BS Central nervous system: CN2-12 grossly intact, strength intact Extremities: Perfused, no clubbing Skin: Normal skin turgor, no notable skin lesions seen Psychiatry: Mood normal // no visual hallucinations   Data Reviewed: I have personally reviewed following labs and imaging studies  CBC: Recent Labs  Lab 06/25/21 1937 06/26/21 0409 06/27/21 0417 06/28/21 0529 06/29/21 0502  WBC 14.7* 17.1* 19.5* 15.9* 11.0*  NEUTROABS 13.5* 14.9* 18.2*  --   --   HGB  13.0 12.5* 12.2* 11.6* 10.6*  HCT 41.6 39.6 38.8* 37.0* 34.6*  MCV 82.1 81.1 81.7 80.6 80.7  PLT 168 169 216 199 416   Basic Metabolic Panel: Recent Labs  Lab 06/25/21 1937 06/26/21 0409 06/27/21 0417 06/27/21 1605 06/28/21 0529 06/29/21 0502  NA 146* 147* 148* 146* 145 144  K 3.5 3.7 3.1*  --  4.0 3.7  CL 104 107 109  --  107 106  CO2 32 30 31  --  29 33*  GLUCOSE 143* 109* 200*  --  178* 134*  BUN 23 27* 48*  --  46* 33*  CREATININE 0.95 0.99 0.94  --  0.88 0.93  CALCIUM 9.0 8.9 9.3  --  8.8* 9.0  MG 2.4 2.5* 2.9*  --  2.8* 2.7*  PHOS  --   --  2.4*  --  3.3 3.3   GFR: Estimated Creatinine Clearance: 57.9 mL/min (by C-G formula based on SCr of 0.93 mg/dL). Liver Function Tests: Recent Labs  Lab 06/26/21 0409 06/27/21 0417 06/28/21 0529  AST 73* 391* 319*  ALT 87* 293* 333*  ALKPHOS 109 126 119  BILITOT 1.0 0.6 0.6  PROT 7.3 7.1 6.6  ALBUMIN 2.4* 2.0* 1.9*   No results for input(s): LIPASE, AMYLASE in the last 168 hours. No results for input(s): AMMONIA in the last 168 hours. Coagulation Profile: Recent Labs  Lab 06/27/21 0932  INR 1.1   Cardiac Enzymes: Recent Labs  Lab 06/27/21 0932  CKTOTAL 77   BNP (last 3 results) No results for input(s): PROBNP in the last 8760  hours. HbA1C: No results for input(s): HGBA1C in the last 72 hours. CBG: Recent Labs  Lab 07/01/21 0720 07/01/21 1146 07/01/21 1543 07/01/21 1544 07/01/21 1621  GLUCAP 128* 129* 34* 39* 138*   Lipid Profile: No results for input(s): CHOL, HDL, LDLCALC, TRIG, CHOLHDL, LDLDIRECT in the last 72 hours. Thyroid Function Tests: No results for input(s): TSH, T4TOTAL, FREET4, T3FREE, THYROIDAB in the last 72 hours. Anemia Panel: No results for input(s): VITAMINB12, FOLATE, FERRITIN, TIBC, IRON, RETICCTPCT in the last 72 hours. Sepsis Labs: Recent Labs  Lab 06/25/21 1937 06/26/21 0409 06/27/21 0417  PROCALCITON 3.21 15.54 14.18  LATICACIDVEN 2.7*  --   --     Recent Results (from the past 240 hour(s))  CULTURE, BLOOD (ROUTINE X 2) w Reflex to ID Panel     Status: None   Collection Time: 06/25/21  8:05 PM   Specimen: BLOOD  Result Value Ref Range Status   Specimen Description BLOOD LEFT ANTECUBITAL  Final   Special Requests   Final    BOTTLES DRAWN AEROBIC AND ANAEROBIC Blood Culture adequate volume   Culture   Final    NO GROWTH 5 DAYS Performed at [facility], [address] [place], [place] [address]    Report Status 06/30/2021 FINAL  Final  CULTURE, BLOOD (ROUTINE X 2) w Reflex to ID Panel     Status: None   Collection Time: 06/25/21  8:08 PM   Specimen: BLOOD  Result Value Ref Range Status   Specimen Description BLOOD BLOOD LEFT HAND  Final   Special Requests   Final    BOTTLES DRAWN AEROBIC AND ANAEROBIC Blood Culture adequate volume   Culture   Final    NO GROWTH 5 DAYS Performed at [facility], [address], [place], [place] [address]    Report Status 06/30/2021 FINAL  Final  Urine Culture     Status: None  Collection Time: 06/25/21  8:52 PM   Specimen: Urine, Random  Result Value Ref Range Status   Specimen Description   Final    URINE, RANDOM Performed at [facility], [address] [place], [place] [address]     Special Requests   Final    NONE Performed at [facility], [address], [place], [place] [address]    Culture   Final    NO GROWTH Performed at [facility], [place] [address] [place], [place] [address]    Report Status 06/27/2021 FINAL  Final     Radiology Studies: No results found.  Scheduled Meds:  arformoterol  15 mcg Nebulization BID   budesonide (PULMICORT) nebulizer solution  0.25 mg Nebulization BID   chlorhexidine  15 mL Mouth Rinse BID   Chlorhexidine Gluconate Cloth  6 each Topical Q0600   enoxaparin (LOVENOX) injection  40 mg Subcutaneous QHS   famotidine  20 mg Per Tube Daily   free water  150 mL Per Tube Q4H   insulin aspart  0-20 Units Subcutaneous Q4H   insulin detemir  10 Units Subcutaneous Q12H   mouth rinse  15 mL Mouth Rinse q12n4p   metoprolol tartrate  6.25 mg Per Tube BID   revefenacin  175 mcg Nebulization Daily   Continuous Infusions:  sodium chloride Stopped (06/29/21 0922)   feeding supplement (OSMOLITE 1.5 CAL) 1,000 mL (06/30/21 1614)   piperacillin-tazobactam (ZOSYN)  IV 3.375 g (07/01/21 1759)     LOS: 10 days   Marylu Lund, MD [facility] Pager On Amion  If 7PM-7AM, please contact night-coverage 07/01/2021, 6:27 PM

## 2021-07-01 NOTE — TOC Initial Note (Signed)
Transition of Care [facility]) - Initial/Assessment Note    Patient Details  Name: Louis Stone MRN: [id number] Date of Birth: July 22, 1950  Transition [facility]) CM/SW Contact:    Shelbie Hutching, RN Phone Number: 07/01/2021, 11:41 AM  Clinical Narrative:                 Patient admitted to the hospital with pneumonia, dehydration and hypernatremia.  RNCM met with patient at the bedside.  Patient is from home, his stepdaughter is living with him.  He reports that he takes care of himself at home and can drive.  He declines SNF and [place] services at discharge.  Patient reports that he will not even consider going to SNF.  He has a walker at home.  He reports that his stepdaughter can pick him up discharge.  He is on acute O2. He gets him tube feeding supplies from [employer].    Expected Discharge Plan: Home/Self Care Barriers to Discharge: Continued Medical Work up   Patient Goals and CMS Choice Patient states their goals for this hospitalization and ongoing recovery are:: Patient wants to get back home      Expected Discharge Plan and Services Expected Discharge Plan: Home/Self Care   Discharge Planning Services: CM Consult   Living arrangements for the past 2 months: Single Family Home                 DME Arranged: N/A DME Agency: NA       HH Arranged: Refused SNF, Refused HH          Prior Living Arrangements/Services Living arrangements for the past 2 months: Single Family Home Lives with:: Relatives Patient language and need for interpreter reviewed:: Yes Do you feel safe going back to the place where you live?: Yes      Need for Family Participation in Patient Care: Yes (Comment) Care giver support system in place?: Yes (comment) (step daughter) Current home services: DME (walker, tube feeding supplies) Criminal Activity/Legal Involvement Pertinent to Current Situation/Hospitalization: No - Comment as needed  Activities of Daily Living Home Assistive  Devices/Equipment: CBG Meter, Dentures (specify type), Eyeglasses ADL Screening (condition at time of admission) Patient's cognitive ability adequate to safely complete daily activities?: Yes Is the patient deaf or have difficulty hearing?: No Does the patient have difficulty seeing, even when wearing glasses/contacts?: Yes Does the patient have difficulty concentrating, remembering, or making decisions?: No Patient able to express need for assistance with ADLs?: Yes Does the patient have difficulty dressing or bathing?: No Independently performs ADLs?: Yes (appropriate for developmental age) Does the patient have difficulty walking or climbing stairs?: No Weakness of Legs: None Weakness of Arms/Hands: None  Permission Sought/Granted Permission sought to share information with : Case Manager, Family Supports Permission granted to share information with : Yes, Verbal Permission Granted  Share Information with NAME: [employer]     Permission granted to share info w Relationship: daughter  Permission granted to share info w Contact Information: [phone]  Emotional Assessment Appearance:: Appears stated age Attitude/Demeanor/Rapport: Engaged Affect (typically observed): Accepting Orientation: : Oriented to Self, Oriented to Place, Oriented to  Time, Oriented to Situation Alcohol / Substance Use: Not Applicable Psych Involvement: No (comment)  Admission diagnosis:  Dehydration [E86.0] Hypernatremia [E87.0] Aspiration pneumonia (HCC) [J69.0] SOB (shortness of breath) [R06.02] Respiratory failure with hypoxia (HCC) [J96.91] HCAP (healthcare-associated pneumonia) [J18.9] Acute respiratory failure with hypoxemia ([place]) [J96.01] Patient Active Problem List   Diagnosis Date Noted   Acute respiratory  failure with hypoxemia ([place]) 06/22/2021   Pneumoperitoneum of unknown etiology    Diabetes mellitus without complication ([place]) 23/36/1224   Dehydration 06/21/2021   Hypernatremia  06/21/2021   HLD (hyperlipidemia) 06/21/2021   Arrhythmia 06/06/2021   Hypoglycemia due to insulin 06/05/2021   Aspiration pneumonia ([place]) 03/12/2021   Protein-calorie malnutrition, severe 02/02/2021   Acute respiratory failure with hypoxia ([place]) 02/01/2021   Squamous cell carcinoma of oropharynx ([place]) 02/01/2021   Acute renal failure (ARF) ([place]) 02/01/2021   Acute left-sided weakness 49/75/3005   Acute metabolic encephalopathy 05/06/1116   Mixed diabetic hyperlipidemia associated with type 2 diabetes mellitus ([place]) 07/16/2020   Uncontrolled type 2 diabetes mellitus with hypoglycemia without coma, with long-term current use of insulin ([place]) 07/16/2020   COVID-19 virus infection 07/16/2020   GERD without esophagitis 07/16/2020   Bradycardia 07/16/2020   Severe sepsis ([place]) 09/16/2016   Personal history of tobacco use, presenting hazards to health 10/08/2015   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   Malfunction of gastrostomy tube ([place]) 2009   PCP:  Theotis Burrow, MD Pharmacy:   [facility] [place] [address] [place] [address] Phone: [phone] Fax: [phone]     Social Determinants of Health (SDOH) Interventions    Readmission Risk Interventions Readmission Risk Prevention Plan 07/01/2021 02/03/2021  Transportation Screening Complete Complete  PCP or Specialist Appt within 3-5 Days - Complete  HRI or [place] - Complete  Social Work Consult for [place] Planning/Counseling - Complete  Palliative Care Screening - Not Applicable  Medication Review Press photographer) Complete Complete  PCP or Specialist appointment within 3-5 days of discharge Complete -  [place] or Home Care Consult Patient refused -  SW Recovery Care/Counseling Consult Complete -  Palliative Care Screening Complete -  [place] Patient Refused -  Some recent data might be hidden

## 2021-07-02 DIAGNOSIS — J9601 Acute respiratory failure with hypoxia: Secondary | ICD-10-CM | POA: Diagnosis not present

## 2021-07-02 LAB — CBC
HCT: 34 % — ABNORMAL LOW (ref 39.0–52.0)
Hemoglobin: 10.7 g/dL — ABNORMAL LOW (ref 13.0–17.0)
MCH: 25.3 pg — ABNORMAL LOW (ref 26.0–34.0)
MCHC: 31.5 g/dL (ref 30.0–36.0)
MCV: 80.4 fL (ref 80.0–100.0)
Platelets: 352 10*3/uL (ref 150–400)
RBC: 4.23 MIL/uL (ref 4.22–5.81)
RDW: 14.7 % (ref 11.5–15.5)
WBC: 7.7 10*3/uL (ref 4.0–10.5)
nRBC: 0 % (ref 0.0–0.2)

## 2021-07-02 LAB — COMPREHENSIVE METABOLIC PANEL
ALT: 132 U/L — ABNORMAL HIGH (ref 0–44)
AST: 52 U/L — ABNORMAL HIGH (ref 15–41)
Albumin: 2.6 g/dL — ABNORMAL LOW (ref 3.5–5.0)
Alkaline Phosphatase: 92 U/L (ref 38–126)
Anion gap: 6 (ref 5–15)
BUN: 25 mg/dL — ABNORMAL HIGH (ref 8–23)
CO2: 31 mmol/L (ref 22–32)
Calcium: 9.2 mg/dL (ref 8.9–10.3)
Chloride: 99 mmol/L (ref 98–111)
Creatinine, Ser: 0.9 mg/dL (ref 0.61–1.24)
GFR, Estimated: >60 mL/min (ref >60)
Glucose, Bld: 169 mg/dL — ABNORMAL HIGH (ref 70–99)
Potassium: 4.2 mmol/L (ref 3.5–5.1)
Sodium: 136 mmol/L (ref 135–145)
Total Bilirubin: 0.5 mg/dL (ref 0.3–1.2)
Total Protein: 6.7 g/dL (ref 6.5–8.1)

## 2021-07-02 LAB — GLUCOSE, CAPILLARY
Glucose-Capillary: 159 mg/dL — ABNORMAL HIGH (ref 70–99)
Glucose-Capillary: 161 mg/dL — ABNORMAL HIGH (ref 70–99)
Glucose-Capillary: 175 mg/dL — ABNORMAL HIGH (ref 70–99)
Glucose-Capillary: 179 mg/dL — ABNORMAL HIGH (ref 70–99)
Glucose-Capillary: 201 mg/dL — ABNORMAL HIGH (ref 70–99)
Glucose-Capillary: 238 mg/dL — ABNORMAL HIGH (ref 70–99)
Glucose-Capillary: 247 mg/dL — ABNORMAL HIGH (ref 70–99)

## 2021-07-02 MED ORDER — CALCIUM CARBONATE ANTACID 500 MG PO CHEW: 1.0000 | CHEWABLE_TABLET | Freq: Three times a day (TID) | ORAL | Status: DC | PRN

## 2021-07-02 MED ORDER — FREE WATER
100.0000 mL | Status: DC
  Administered 2021-07-02 – 2021-07-05 (×17): 100 mL

## 2021-07-02 MED ORDER — CHLORPROMAZINE HCL 25 MG/ML IJ SOLN
25.0000 mg | Freq: Three times a day (TID) | INTRAMUSCULAR | Status: DC | PRN
  Administered 2021-07-02 – 2021-07-03 (×2): 25 mg via INTRAMUSCULAR
  Filled 2021-07-02 (×7): qty 1

## 2021-07-02 NOTE — Progress Notes (Signed)
Pt for transfer to 1C. Report given to [facility], Therapist, sports. Pt informed of plan of care. Unable to reach daughter via phonecall to inform of transfer

## 2021-07-02 NOTE — Progress Notes (Signed)
PROGRESS NOTE    Louis Stone  HWT:[id number] DOB: [birth year] DOA: 06/21/2021 PCP: Theotis Burrow, MD    Brief Narrative:  70 y.o. male with medical history significant of oropharynx cancer, s/p of G-tube placement, diabetes mellitus, hyperlipidemia, GERD, former smoker, aspiration pneumonia, who presents with shortness breath and cough.Pt was admitted for acute hypoxemic failure from aspiration PNA  Assessment & Plan:   Principal Problem:   Aspiration pneumonia ([place]) Active Problems:   Acute metabolic encephalopathy   GERD without esophagitis   Acute respiratory failure with hypoxia (HCC)   Squamous cell carcinoma of oropharynx (HCC)   Acute renal failure (ARF) (HCC)   Protein-calorie malnutrition, severe   Diabetes mellitus without complication (HCC)   Dehydration   Hypernatremia   HLD (hyperlipidemia)   Acute respiratory failure with hypoxemia (HCC)   Pneumoperitoneum of unknown etiology  1.Acute hypoxic respiratory failure due to aspiration pneumonia Sepsis -ID consulted-Staph hominis and hemolyticus are 2 skin bacteria in the blood culture and are contaminants- no reason to suspect endocarditis- no need for TEE or treatment  Rec.discontinuing vancomycin since MRSA naris negative and hence MRSA pneumonia unlikely.  12/27 continue zosyn for anaerobic coverage. Has been weaned from HFNC to now minimal O2 support   2.hx/oEmphysema with prior smoking Transferred to SDU 12/22 critical care consulted Antibiotics broadened to cefepime and vancomycin--procalcitonin 3.2 --->15 do not de-escalate antibiotics at this time  Blood recultured-12/22-follow result-attempt sputum culture  follow urine culture 12/22 12/27 continue Brovana, pulmicort and nebs Steroids were discontinued. Pt initially required high flow O2, now weaned to minimal o2 support    3.G-tube dependent Adult failure to thrive in the setting of cancer -continue tube feeding as tolerated    4.Hypernatremia      Likely free water deficit     12/26 improved with dextrose infusion . Sodium has normalized   5.Poorly controlled diabetes mellitus with DKA this admission and profound hypoglycemia last admission A1c 6.8 -Now on decreased QHS lantus dose of 10 units, had d/c meal coverage -Continue SSI   6.AKI  Cr has normalized Cont to follow bmet trends   7.12 beats of V. tach 0n 12/23    On beta block Mg stable Echo nml ef Cards following-conservative mx. No further w/u. F/u with cards as outpt    DVT prophylaxis: Lovenox subq Code Status: Full Family Communication: pt in room, family not at bedside  Status is: Inpatient  Remains inpatient appropriate because: Severity of illness   Consultants:  PCCM ID Palliative Care Nephrology General Surgery  Procedures:    Antimicrobials: Anti-infectives (From admission, onward)    Start     Dose/Rate Route Frequency Ordered Stop   06/27/21 1800  piperacillin-tazobactam (ZOSYN) IVPB 3.375 g        3.375 g 12.5 mL/hr over 240 Minutes Intravenous Every 8 hours 06/27/21 1715     06/26/21 2200  vancomycin (VANCOCIN) IVPB 1000 mg/200 mL premix  Status:  Discontinued        1,000 mg 200 mL/hr over 60 Minutes Intravenous Every 24 hours 06/25/21 2019 06/27/21 1710   06/25/21 2100  vancomycin (VANCOREADY) IVPB 1250 mg/250 mL        1,250 mg 166.7 mL/hr over 90 Minutes Intravenous  Once 06/25/21 2006 06/26/21 0112   06/25/21 2030  ceFEPIme (MAXIPIME) 2 g in sodium chloride 0.9 % 100 mL IVPB  Status:  Discontinued        2 g 200 mL/hr over 30 Minutes Intravenous Every 12 hours 06/25/21 2006  06/27/21 1711   06/25/21 1500  Ampicillin-Sulbactam (UNASYN) 3 g in sodium chloride 0.9 % 100 mL IVPB  Status:  Discontinued        3 g 200 mL/hr over 30 Minutes Intravenous Every 6 hours 06/25/21 1356 06/25/21 1957   06/24/21 2000  amoxicillin-clavulanate (AUGMENTIN) 875-125 MG per tablet 1 tablet  Status:  Discontinued        1  tablet Per Tube Every 12 hours 06/24/21 1912 06/25/21 1353   06/24/21 1800  amoxicillin-clavulanate (AUGMENTIN) 875-125 MG per tablet 1 tablet  Status:  Discontinued        1 tablet Oral Every 12 hours 06/24/21 1635 06/24/21 1912   06/22/21 0300  ceFEPIme (MAXIPIME) 2 g in sodium chloride 0.9 % 100 mL IVPB  Status:  Discontinued        2 g 200 mL/hr over 30 Minutes Intravenous Every 12 hours 06/21/21 1526 06/21/21 1546   06/21/21 2200  Ampicillin-Sulbactam (UNASYN) 3 g in sodium chloride 0.9 % 100 mL IVPB  Status:  Discontinued        3 g 200 mL/hr over 30 Minutes Intravenous Every 6 hours 06/21/21 1551 06/24/21 1635   06/21/21 1600  metroNIDAZOLE (FLAGYL) IVPB 500 mg  Status:  Discontinued        500 mg 100 mL/hr over 60 Minutes Intravenous Every 12 hours 06/21/21 1511 06/21/21 1546   06/21/21 1500  vancomycin (VANCOCIN) IVPB 1000 mg/200 mL premix  Status:  Discontinued        1,000 mg 200 mL/hr over 60 Minutes Intravenous  Once 06/21/21 1447 06/21/21 1455   06/21/21 1500  ceFEPIme (MAXIPIME) 2 g in sodium chloride 0.9 % 100 mL IVPB        2 g 200 mL/hr over 30 Minutes Intravenous  Once 06/21/21 1447 06/21/21 1548   06/21/21 1500  vancomycin (VANCOREADY) IVPB 1250 mg/250 mL  Status:  Discontinued        1,250 mg 166.7 mL/hr over 90 Minutes Intravenous  Once 06/21/21 1455 06/21/21 1546       Subjective: Complaining of hiccups and some indigestion today  Objective: Vitals:   07/02/21 1100 07/02/21 1200 07/02/21 1255 07/02/21 1550  BP: 90/68 105/64 118/63 109/67  Pulse: 85 88 92 87  Resp: 20 20 20 20   Temp: 97.9 F (36.6 C)  (!) 97.4 F (36.3 C) 97.8 F (36.6 C)  TempSrc: Oral  Oral   SpO2: 94% 94% 93% 98%  Weight: 54.1 kg     Height:        Intake/Output Summary (Last 24 hours) at 07/02/2021 1605 Last data filed at 07/02/2021 1211 Gross per 24 hour  Intake 1947.7 ml  Output 1950 ml  Net -2.3 ml    Filed Weights   06/22/21 0331 06/25/21 1857 07/02/21 1100  Weight:  48.2 kg 54.6 kg 54.1 kg    Examination: General exam: Conversant, in no acute distress Respiratory system: normal chest rise, clear, no audible wheezing Cardiovascular system: regular rhythm, s1-s2 Gastrointestinal system: Nondistended, nontender, pos BS Central nervous system: No seizures, no tremors Extremities: No cyanosis, no joint deformities Skin: No rashes, no pallor Psychiatry: Affect normal // no auditory hallucinations   Data Reviewed: I have personally reviewed following labs and imaging studies  CBC: Recent Labs  Lab 06/25/21 1937 06/26/21 0409 06/27/21 0417 06/28/21 0529 06/29/21 0502 07/02/21 0745  WBC 14.7* 17.1* 19.5* 15.9* 11.0* 7.7  NEUTROABS 13.5* 14.9* 18.2*  --   --   --   HGB  13.0 12.5* 12.2* 11.6* 10.6* 10.7*  HCT 41.6 39.6 38.8* 37.0* 34.6* 34.0*  MCV 82.1 81.1 81.7 80.6 80.7 80.4  PLT 168 169 216 199 235 376    Basic Metabolic Panel: Recent Labs  Lab 06/25/21 1937 06/26/21 0409 06/27/21 0417 06/27/21 1605 06/28/21 0529 06/29/21 0502 07/02/21 0745  NA 146* 147* 148* 146* 145 144 136  K 3.5 3.7 3.1*  --  4.0 3.7 4.2  CL 104 107 109  --  107 106 99  CO2 32 30 31  --  29 33* 31  GLUCOSE 143* 109* 200*  --  178* 134* 169*  BUN 23 27* 48*  --  46* 33* 25*  CREATININE 0.95 0.99 0.94  --  0.88 0.93 0.90  CALCIUM 9.0 8.9 9.3  --  8.8* 9.0 9.2  MG 2.4 2.5* 2.9*  --  2.8* 2.7*  --   PHOS  --   --  2.4*  --  3.3 3.3  --     GFR: Estimated Creatinine Clearance: 59.3 mL/min (by C-G formula based on SCr of 0.9 mg/dL). Liver Function Tests: Recent Labs  Lab 06/26/21 0409 06/27/21 0417 06/28/21 0529 07/02/21 0745  AST 73* 391* 319* 52*  ALT 87* 293* 333* 132*  ALKPHOS 109 126 119 92  BILITOT 1.0 0.6 0.6 0.5  PROT 7.3 7.1 6.6 6.7  ALBUMIN 2.4* 2.0* 1.9* 2.6*    No results for input(s): LIPASE, AMYLASE in the last 168 hours. No results for input(s): AMMONIA in the last 168 hours. Coagulation Profile: Recent Labs  Lab 06/27/21 0932   INR 1.1    Cardiac Enzymes: Recent Labs  Lab 06/27/21 0932  CKTOTAL 77    BNP (last 3 results) No results for input(s): PROBNP in the last 8760 hours. HbA1C: No results for input(s): HGBA1C in the last 72 hours. CBG: Recent Labs  Lab 07/02/21 0324 07/02/21 0718 07/02/21 1118 07/02/21 1256 07/02/21 1551  GLUCAP 179* 161* 201* 159* 175*    Lipid Profile: No results for input(s): CHOL, HDL, LDLCALC, TRIG, CHOLHDL, LDLDIRECT in the last 72 hours. Thyroid Function Tests: No results for input(s): TSH, T4TOTAL, FREET4, T3FREE, THYROIDAB in the last 72 hours. Anemia Panel: No results for input(s): VITAMINB12, FOLATE, FERRITIN, TIBC, IRON, RETICCTPCT in the last 72 hours. Sepsis Labs: Recent Labs  Lab 06/25/21 1937 06/26/21 0409 06/27/21 0417  PROCALCITON 3.21 15.54 14.18  LATICACIDVEN 2.7*  --   --      Recent Results (from the past 240 hour(s))  CULTURE, BLOOD (ROUTINE X 2) w Reflex to ID Panel     Status: None   Collection Time: 06/25/21  8:05 PM   Specimen: BLOOD  Result Value Ref Range Status   Specimen Description BLOOD LEFT ANTECUBITAL  Final   Special Requests   Final    BOTTLES DRAWN AEROBIC AND ANAEROBIC Blood Culture adequate volume   Culture   Final    NO GROWTH 5 DAYS Performed at [facility], [address] [place], [place] [address]    Report Status 06/30/2021 FINAL  Final  CULTURE, BLOOD (ROUTINE X 2) w Reflex to ID Panel     Status: None   Collection Time: 06/25/21  8:08 PM   Specimen: BLOOD  Result Value Ref Range Status   Specimen Description BLOOD BLOOD LEFT HAND  Final   Special Requests   Final    BOTTLES DRAWN AEROBIC AND ANAEROBIC Blood Culture adequate volume   Culture   Final  NO GROWTH 5 DAYS Performed at [facility], [address] [place], [place] [address]    Report Status 06/30/2021 FINAL  Final  Urine Culture     Status: None   Collection Time: 06/25/21  8:52 PM   Specimen: Urine, Random  Result  Value Ref Range Status   Specimen Description   Final    URINE, RANDOM Performed at [facility], [place], [address], [place], [place] [address]    Special Requests   Final    NONE Performed at [facility], [address], [place], [place] [address]    Culture   Final    NO GROWTH Performed at [facility], [place] [address] [place], [place] [address]    Report Status 06/27/2021 FINAL  Final      Radiology Studies: No results found.  Scheduled Meds:  arformoterol  15 mcg Nebulization BID   budesonide (PULMICORT) nebulizer solution  0.25 mg Nebulization BID   chlorhexidine  15 mL Mouth Rinse BID   Chlorhexidine Gluconate Cloth  6 each Topical Q0600   enoxaparin (LOVENOX) injection  40 mg Subcutaneous QHS   famotidine  20 mg Per Tube Daily   free water  100 mL Per Tube Q4H   insulin aspart  0-20 Units Subcutaneous Q4H   insulin detemir  10 Units Subcutaneous Q12H   mouth rinse  15 mL Mouth Rinse q12n4p   metoprolol tartrate  6.25 mg Per Tube BID   revefenacin  175 mcg Nebulization Daily   Continuous Infusions:  sodium chloride Stopped (06/29/21 0922)   feeding supplement (OSMOLITE 1.5 CAL) 1,000 mL (07/02/21 0803)   piperacillin-tazobactam (ZOSYN)  IV 12.5 mL/hr at 07/02/21 1200     LOS: 11 days   Marylu Lund, MD [facility] Pager On Amion  If 7PM-7AM, please contact night-coverage 07/02/2021, 4:05 PM

## 2021-07-02 NOTE — Progress Notes (Signed)
Nutrition Follow Up Note  ° °DOCUMENTATION CODES:  ° °Severe malnutrition in context of chronic illness ° °INTERVENTION:  ° °Continue Osmolite 1.5@60ml/hr  ° °Free water flushes 100ml q4 hours °  °Regimen provides 2160kcal/day, 90g/day protein and 1697ml/day free water.  ° °NUTRITION DIAGNOSIS:  ° °Severe Malnutrition related to cancer and cancer related treatments as evidenced by severe muscle depletion, severe fat depletion, percent weight loss. ° °GOAL:  ° °Patient will meet greater than or equal to 90% of their needs °-met with tube feeds  ° °MONITOR:  ° °Labs, Weight trends, TF tolerance, Skin, I & O's ° °ASSESSMENT:  ° °69 y.o. 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 Refeed labs stable. Hypernatremia resolved. Per chart, pt appears weight stable since admission. Pt +7.3L on his I & Os. Pt declines SNF.  ° °Medications reviewed and include: lovenox, pepcid, insulin, zosyn  ° °Labs reviewed: K 4.2 wnl, BUN 25(H) °P 3.3 wnl, Mg 2.7(H)- 12/26 °Hgb 10.7(L), Hct 34.0(L) °Cbgs- 201, 161, 179 x 24 hrs ° °Diet Order:   °Diet Order   ° °       °  Diet NPO time specified  Diet effective now       °  ° °  °  ° °  ° °EDUCATION NEEDS:  ° °Education needs have been addressed ° °Skin:  Skin Assessment: Reviewed RN Assessment (ecchymosis) ° °Last BM:  12/28- type 7 ° °Height:  ° °Ht Readings from Last 1 Encounters:  °06/25/21 5' 5" (1.651 m)  ° ° °Weight:  ° °Wt Readings from Last 1 Encounters:  °06/25/21 54.6 kg  ° ° °Ideal Body Weight:  61.8 kg ° °BMI:  Body mass index is 20.03 kg/m². ° °Estimated Nutritional Needs:  ° °Kcal:  1800-2100kcal/day ° °Protein:  90-105 ° °Fluid:  1.5-1.7L/day ° °  MS, RD, LDN °Please refer to AMION for RD and/or RD on-call/weekend/after hours pager ° °

## 2021-07-02 NOTE — Progress Notes (Signed)
Physical Therapy Treatment Patient Details Name: Louis Stone MRN: [id number] DOB: 12-04-1950 Today's Date: 07/02/2021   History of Present Illness Louis Stone is a 70 y.o. male with medical history significant of oropharynx cancer, s/p of G-tube placement, diabetes mellitus, hyperlipidemia, GERD, former smoker, aspiration pneumonia, who presents with shortness breath and cough.    PT Comments    Pt received supine in bed reporting cervical pain. PT initiated session with soft tissue mobilization, PROM and stretching to bilateral cervical musculature. Pt then agreeable to therapeutic exercise of STS and seated strengthening. Reps and standing time with each STS were limited by fatigue. Pt asking to return to bed. Blankets provided. Functional mobility is progressing with less assist required however pt continues to present with deficits in strength, ROM, balance and endurance. Would benefit from skilled PT to address above deficits and promote optimal return to PLOF.   Recommendations for follow up therapy are one component of a multi-disciplinary discharge planning process, led by the attending physician.  Recommendations may be updated based on patient status, additional functional criteria and insurance authorization.  Follow Up Recommendations  Skilled nursing-short term rehab (<3 hours/day)     Assistance Recommended at Discharge Frequent or constant Supervision/Assistance  Equipment Recommendations  Other (comment) (TBD at next venue of care)    Recommendations for Other Services       Precautions / Restrictions Precautions Precautions: Fall Restrictions Weight Bearing Restrictions: No     Mobility  Bed Mobility Overal bed mobility: Modified Independent                  Transfers Overall transfer level: Needs assistance Equipment used: None Transfers: Sit to/from Stand Sit to Stand: Min guard           General transfer comment: CGA for mild  steadying to stand from EOB. HHA provided for first rep, no external support for final 2. 3 reps performed for strengthening, pt reporting fatigue and unable to perform additional reps.    Ambulation/Gait               General Gait Details: deffered - pt reporting fatigue and asking to remain at bedside   Stairs             Wheelchair Mobility    Modified Rankin (Stroke Patients Only)       Balance Overall balance assessment: Needs assistance Sitting-balance support: Feet supported Sitting balance-Leahy Scale: Fair Sitting balance - Comments: mild posterior lean with seated therex; no LOB   Standing balance support: During functional activity;No upper extremity supported Standing balance-Leahy Scale: Fair Standing balance comment: Light CGA to steady once upright. HHA required on first stand.                            Cognition Arousal/Alertness: Awake/alert Behavior During Therapy: WFL for tasks assessed/performed Overall Cognitive Status: Within Functional Limits for tasks assessed                                 General Comments: difficult to understand        Exercises Total Joint Exercises Long Arc Quad: Strengthening;Both;20 reps;Seated Marching in Standing: Strengthening;Both;20 reps;Seated Other Exercises Other Exercises: Cervical PROM and stretching to alleviate tightness/pain. Multiple trigger points detected in upper traps, scalenes and SCM, R>L.    General Comments        Pertinent Vitals/Pain Pain Assessment:  No/denies pain    Home Living                          Prior Function            PT Goals (current goals can now be found in the care plan section) Acute Rehab PT Goals Patient Stated Goal: to go home PT Goal Formulation: With patient Time For Goal Achievement: 07/09/21 Potential to Achieve Goals: Fair    Frequency    Min 2X/week      PT Plan      Co-evaluation               AM-PAC PT "6 Clicks" Mobility   Outcome Measure  Help needed turning from your back to your side while in a flat bed without using bedrails?: A Little Help needed moving from lying on your back to sitting on the side of a flat bed without using bedrails?: A Little Help needed moving to and from a bed to a chair (including a wheelchair)?: A Little Help needed standing up from a chair using your arms (e.g., wheelchair or bedside chair)?: A Little Help needed to walk in hospital room?: A Little Help needed climbing 3-5 steps with a railing? : Total 6 Click Score: 16    End of Session Equipment Utilized During Treatment: Oxygen Activity Tolerance: Patient tolerated treatment well Patient left: in bed;with call bell/phone within reach;with bed alarm set Nurse Communication: Mobility status PT Visit Diagnosis: Unsteadiness on feet (R26.81);Muscle weakness (generalized) (M62.81);Difficulty in walking, not elsewhere classified (R26.2);Other abnormalities of gait and mobility (R26.89)     Time: 9937-1696 PT Time Calculation (min) (ACUTE ONLY): 20 min  Charges:  $Therapeutic Exercise: 8-22 mins                     Patrina Levering PT, DPT 07/02/21 3:36 PM [phone]

## 2021-07-03 DIAGNOSIS — L8990 Pressure ulcer of unspecified site, unspecified stage: Secondary | ICD-10-CM | POA: Insufficient documentation

## 2021-07-03 LAB — CBC
HCT: 32.8 % — ABNORMAL LOW (ref 39.0–52.0)
Hemoglobin: 10.4 g/dL — ABNORMAL LOW (ref 13.0–17.0)
MCH: 25.4 pg — ABNORMAL LOW (ref 26.0–34.0)
MCHC: 31.7 g/dL (ref 30.0–36.0)
MCV: 80.2 fL (ref 80.0–100.0)
Platelets: 379 10*3/uL (ref 150–400)
RBC: 4.09 MIL/uL — ABNORMAL LOW (ref 4.22–5.81)
RDW: 14.6 % (ref 11.5–15.5)
WBC: 6.4 10*3/uL (ref 4.0–10.5)
nRBC: 0 % (ref 0.0–0.2)

## 2021-07-03 LAB — COMPREHENSIVE METABOLIC PANEL
ALT: 115 U/L — ABNORMAL HIGH (ref 0–44)
AST: 46 U/L — ABNORMAL HIGH (ref 15–41)
Albumin: 2.6 g/dL — ABNORMAL LOW (ref 3.5–5.0)
Alkaline Phosphatase: 90 U/L (ref 38–126)
Anion gap: 6 (ref 5–15)
BUN: 25 mg/dL — ABNORMAL HIGH (ref 8–23)
CO2: 32 mmol/L (ref 22–32)
Calcium: 9.2 mg/dL (ref 8.9–10.3)
Chloride: 99 mmol/L (ref 98–111)
Creatinine, Ser: 1 mg/dL (ref 0.61–1.24)
GFR, Estimated: >60 mL/min (ref >60)
Glucose, Bld: 110 mg/dL — ABNORMAL HIGH (ref 70–99)
Potassium: 4 mmol/L (ref 3.5–5.1)
Sodium: 137 mmol/L (ref 135–145)
Total Bilirubin: 0.4 mg/dL (ref 0.3–1.2)
Total Protein: 6.7 g/dL (ref 6.5–8.1)

## 2021-07-03 LAB — GLUCOSE, CAPILLARY
Glucose-Capillary: 123 mg/dL — ABNORMAL HIGH (ref 70–99)
Glucose-Capillary: 219 mg/dL — ABNORMAL HIGH (ref 70–99)
Glucose-Capillary: 257 mg/dL — ABNORMAL HIGH (ref 70–99)
Glucose-Capillary: 218 mg/dL — ABNORMAL HIGH (ref 70–99)
Glucose-Capillary: 227 mg/dL — ABNORMAL HIGH (ref 70–99)

## 2021-07-03 MED ORDER — SODIUM CHLORIDE 0.9 % IV BOLUS
500.0000 mL | Freq: Once | INTRAVENOUS | Status: AC
Start: 2021-07-03 — End: 2021-07-03
  Administered 2021-07-03: 13:00:00 500 mL via INTRAVENOUS

## 2021-07-03 NOTE — Care Management Important Message (Signed)
Important Message  Patient Details  Name: Louis Stone MRN: [id number] Date of Birth: [birth year]   Medicare Important Message Given:  Yes     Juliann Pulse A Sherol Sabas 07/03/2021, 11:04 AM

## 2021-07-03 NOTE — Progress Notes (Signed)
Mobility Specialist - Progress Note   07/03/21 1600  Mobility  Activity Ambulated to bathroom  Level of Assistance Minimal assist, patient does 75% or more  Assistive Device None  Distance Ambulated (ft) 10 ft  Mobility Out of bed for toileting  Mobility Response Tolerated well  Mobility performed by Mobility specialist  $Mobility charge 1 Mobility    Pt ambulated to bathroom with minA (+2 for safety with RN). Pt refused use of AD, unsteady with ambulation. Assistance for peri-care d/t UE support needed for balance. Pt took lateral steps towards HOB before returning supine. Pt left in bed with needs in reach.    Kathee Delton Mobility Specialist 07/03/21, 4:17 PM

## 2021-07-03 NOTE — Progress Notes (Signed)
PT Cancellation Note  Patient Details Name: Louis Stone MRN: [id number] DOB: 05-05-1951   Cancelled Treatment:    Reason Eval/Treat Not Completed: Patient declined, no reason specified. Chart reviewed ans spoke to RN. Upon arrival, pt declines therapy stating he does not feel well today. PT offered transfer to recliner, STS from EOB and to return to bed and supine exercises - pt declining all. PT educated on weakness acquired due to immobility and encouraged pt to sit up in chair this evening or tomorrow. PT will continue to follow and treat as pt is willing and agreeable.    Patrina Levering PT, DPT 07/03/21 3:06 PM [phone]

## 2021-07-03 NOTE — Progress Notes (Signed)
PROGRESS NOTE    Louis Stone  TIR:[id number] DOB: December 21, 1950 DOA: 06/21/2021 PCP: Theotis Burrow, MD    Brief Narrative:  70 y.o. male with medical history significant of oropharynx cancer, s/p of G-tube placement, diabetes mellitus, hyperlipidemia, GERD, former smoker, aspiration pneumonia, who presents with shortness breath and cough.Pt was admitted for acute hypoxemic failure from aspiration PNA  Assessment & Plan:   Principal Problem:   Aspiration pneumonia ([place]) Active Problems:   Acute metabolic encephalopathy   GERD without esophagitis   Acute respiratory failure with hypoxia (HCC)   Squamous cell carcinoma of oropharynx (HCC)   Acute renal failure (ARF) (HCC)   Protein-calorie malnutrition, severe   Diabetes mellitus without complication (HCC)   Dehydration   Hypernatremia   HLD (hyperlipidemia)   Acute respiratory failure with hypoxemia (HCC)   Pneumoperitoneum of unknown etiology  1.Acute hypoxic respiratory failure due to aspiration pneumonia Sepsis -ID consulted-Staph hominis and hemolyticus are 2 skin bacteria in the blood culture and are contaminants- no reason to suspect endocarditis- no need for TEE or treatment  Rec.discontinuing vancomycin since MRSA naris negative and hence MRSA pneumonia unlikely.  12/27 continue zosyn for anaerobic coverage. Has been weaned from [facility], presently weaned to room air   2.hx/oEmphysema with prior smoking Transferred to SDU 12/22 critical care consulted Antibiotics broadened to cefepime and vancomycin--procalcitonin 3.2 --->15 do not de-escalate antibiotics at this time  Blood recultured-12/22-follow result-attempt sputum culture  follow urine culture 12/22 12/27 continue Brovana, pulmicort and nebs Steroids were discontinued. Pt initially required high flow O2, now weaned to minimal o2 support    3.G-tube dependent Adult failure to thrive in the setting of cancer -continue tube feeding as tolerated    4.Hypernatremia      Likely free water deficit     12/26 improved with dextrose infusion . Sodium has normalized   5.Poorly controlled diabetes mellitus with DKA this admission and profound hypoglycemia last admission A1c 6.8 -Now on decreased QHS lantus dose of 10 units, had d/c meal coverage -Continue SSI while in hospital   6.AKI  Cr has normalized Cont to follow bmet trends   7.12 beats of V. tach 0n 12/23    was placed on beta blocker, now held secondary to hypotension Mg stable Echo nml ef Cards following-conservative mx. No further w/u. F/u with cards as outpt  8. Hypotension -held beta blocker for now, would resume as bp allows -Given IVF bolus     DVT prophylaxis: Lovenox subq Code Status: Full Family Communication: pt in room, family not at bedside  Status is: Inpatient  Remains inpatient appropriate because: Severity of illness   Consultants:  PCCM ID Palliative Care Nephrology General Surgery  Procedures:    Antimicrobials: Anti-infectives (From admission, onward)    Start     Dose/Rate Route Frequency Ordered Stop   06/27/21 1800  piperacillin-tazobactam (ZOSYN) IVPB 3.375 g        3.375 g 12.5 mL/hr over 240 Minutes Intravenous Every 8 hours 06/27/21 1715 07/04/21 0959   06/26/21 2200  vancomycin (VANCOCIN) IVPB 1000 mg/200 mL premix  Status:  Discontinued        1,000 mg 200 mL/hr over 60 Minutes Intravenous Every 24 hours 06/25/21 2019 06/27/21 1710   06/25/21 2100  vancomycin (VANCOREADY) IVPB 1250 mg/250 mL        1,250 mg 166.7 mL/hr over 90 Minutes Intravenous  Once 06/25/21 2006 06/26/21 0112   06/25/21 2030  ceFEPIme (MAXIPIME) 2 g in sodium chloride 0.9 %  100 mL IVPB  Status:  Discontinued        2 g 200 mL/hr over 30 Minutes Intravenous Every 12 hours 06/25/21 2006 06/27/21 1711   06/25/21 1500  Ampicillin-Sulbactam (UNASYN) 3 g in sodium chloride 0.9 % 100 mL IVPB  Status:  Discontinued        3 g 200 mL/hr over 30 Minutes  Intravenous Every 6 hours 06/25/21 1356 06/25/21 1957   06/24/21 2000  amoxicillin-clavulanate (AUGMENTIN) 875-125 MG per tablet 1 tablet  Status:  Discontinued        1 tablet Per Tube Every 12 hours 06/24/21 1912 06/25/21 1353   06/24/21 1800  amoxicillin-clavulanate (AUGMENTIN) 875-125 MG per tablet 1 tablet  Status:  Discontinued        1 tablet Oral Every 12 hours 06/24/21 1635 06/24/21 1912   06/22/21 0300  ceFEPIme (MAXIPIME) 2 g in sodium chloride 0.9 % 100 mL IVPB  Status:  Discontinued        2 g 200 mL/hr over 30 Minutes Intravenous Every 12 hours 06/21/21 1526 06/21/21 1546   06/21/21 2200  Ampicillin-Sulbactam (UNASYN) 3 g in sodium chloride 0.9 % 100 mL IVPB  Status:  Discontinued        3 g 200 mL/hr over 30 Minutes Intravenous Every 6 hours 06/21/21 1551 06/24/21 1635   06/21/21 1600  metroNIDAZOLE (FLAGYL) IVPB 500 mg  Status:  Discontinued        500 mg 100 mL/hr over 60 Minutes Intravenous Every 12 hours 06/21/21 1511 06/21/21 1546   06/21/21 1500  vancomycin (VANCOCIN) IVPB 1000 mg/200 mL premix  Status:  Discontinued        1,000 mg 200 mL/hr over 60 Minutes Intravenous  Once 06/21/21 1447 06/21/21 1455   06/21/21 1500  ceFEPIme (MAXIPIME) 2 g in sodium chloride 0.9 % 100 mL IVPB        2 g 200 mL/hr over 30 Minutes Intravenous  Once 06/21/21 1447 06/21/21 1548   06/21/21 1500  vancomycin (VANCOREADY) IVPB 1250 mg/250 mL  Status:  Discontinued        1,250 mg 166.7 mL/hr over 90 Minutes Intravenous  Once 06/21/21 1455 06/21/21 1546       Subjective: Still having hiccups this AM, denies sob  Objective: Vitals:   07/02/21 2342 07/03/21 0434 07/03/21 0744 07/03/21 1313  BP: 91/60 (!) 81/50 (!) 87/54 121/68  Pulse: 96 95 87 96  Resp: 16 17 15    Temp: 98.3 F (36.8 C) 98 F (36.7 C) 98.1 F (36.7 C) 98.4 F (36.9 C)  TempSrc:  Oral Oral Oral  SpO2: 97% 96% 97% 98%  Weight:      Height:        Intake/Output Summary (Last 24 hours) at 07/03/2021 1533 Last  data filed at 07/03/2021 0435 Gross per 24 hour  Intake --  Output 1500 ml  Net -1500 ml    Filed Weights   06/22/21 0331 06/25/21 1857 07/02/21 1100  Weight: 48.2 kg 54.6 kg 54.1 kg    Examination: General exam: Awake, laying in bed, in nad Respiratory system: Normal respiratory effort, no wheezing Cardiovascular system: regular rate, s1, s2 Gastrointestinal system: Soft, nondistended, positive BS Central nervous system: CN2-12 grossly intact, strength intact Extremities: Perfused, no clubbing Skin: Normal skin turgor, no notable skin lesions seen Psychiatry: Mood normal // no visual hallucinations   Data Reviewed: I have personally reviewed following labs and imaging studies  CBC: Recent Labs  Lab 06/27/21 0417 06/28/21 0529  06/29/21 0502 07/02/21 0745 07/03/21 0413  WBC 19.5* 15.9* 11.0* 7.7 6.4  NEUTROABS 18.2*  --   --   --   --   HGB 12.2* 11.6* 10.6* 10.7* 10.4*  HCT 38.8* 37.0* 34.6* 34.0* 32.8*  MCV 81.7 80.6 80.7 80.4 80.2  PLT 216 199 235 352 185    Basic Metabolic Panel: Recent Labs  Lab 06/27/21 0417 06/27/21 1605 06/28/21 0529 06/29/21 0502 07/02/21 0745 07/03/21 0413  NA 148* 146* 145 144 136 137  K 3.1*  --  4.0 3.7 4.2 4.0  CL 109  --  107 106 99 99  CO2 31  --  29 33* 31 32  GLUCOSE 200*  --  178* 134* 169* 110*  BUN 48*  --  46* 33* 25* 25*  CREATININE 0.94  --  0.88 0.93 0.90 1.00  CALCIUM 9.3  --  8.8* 9.0 9.2 9.2  MG 2.9*  --  2.8* 2.7*  --   --   PHOS 2.4*  --  3.3 3.3  --   --     GFR: Estimated Creatinine Clearance: 53.3 mL/min (by C-G formula based on SCr of 1 mg/dL). Liver Function Tests: Recent Labs  Lab 06/27/21 0417 06/28/21 0529 07/02/21 0745 07/03/21 0413  AST 391* 319* 52* 46*  ALT 293* 333* 132* 115*  ALKPHOS 126 119 92 90  BILITOT 0.6 0.6 0.5 0.4  PROT 7.1 6.6 6.7 6.7  ALBUMIN 2.0* 1.9* 2.6* 2.6*    No results for input(s): LIPASE, AMYLASE in the last 168 hours. No results for input(s): AMMONIA in the  last 168 hours. Coagulation Profile: Recent Labs  Lab 06/27/21 0932  INR 1.1    Cardiac Enzymes: Recent Labs  Lab 06/27/21 0932  CKTOTAL 77    BNP (last 3 results) No results for input(s): PROBNP in the last 8760 hours. HbA1C: No results for input(s): HGBA1C in the last 72 hours. CBG: Recent Labs  Lab 07/02/21 2006 07/02/21 2341 07/03/21 0437 07/03/21 0833 07/03/21 1217  GLUCAP 238* 247* 123* 219* 257*    Lipid Profile: No results for input(s): CHOL, HDL, LDLCALC, TRIG, CHOLHDL, LDLDIRECT in the last 72 hours. Thyroid Function Tests: No results for input(s): TSH, T4TOTAL, FREET4, T3FREE, THYROIDAB in the last 72 hours. Anemia Panel: No results for input(s): VITAMINB12, FOLATE, FERRITIN, TIBC, IRON, RETICCTPCT in the last 72 hours. Sepsis Labs: Recent Labs  Lab 06/27/21 0417  PROCALCITON 14.18     Recent Results (from the past 240 hour(s))  CULTURE, BLOOD (ROUTINE X 2) w Reflex to ID Panel     Status: None   Collection Time: 06/25/21  8:05 PM   Specimen: BLOOD  Result Value Ref Range Status   Specimen Description BLOOD LEFT ANTECUBITAL  Final   Special Requests   Final    BOTTLES DRAWN AEROBIC AND ANAEROBIC Blood Culture adequate volume   Culture   Final    NO GROWTH 5 DAYS Performed at [facility], [address] [place], [place] [address]    Report Status 06/30/2021 FINAL  Final  CULTURE, BLOOD (ROUTINE X 2) w Reflex to ID Panel     Status: None   Collection Time: 06/25/21  8:08 PM   Specimen: BLOOD  Result Value Ref Range Status   Specimen Description BLOOD BLOOD LEFT HAND  Final   Special Requests   Final    BOTTLES DRAWN AEROBIC AND ANAEROBIC Blood Culture adequate volume   Culture   Final    NO  GROWTH 5 DAYS Performed at [facility], [address] [place], [place] [address]    Report Status 06/30/2021 FINAL  Final  Urine Culture     Status: None   Collection Time: 06/25/21  8:52 PM   Specimen: Urine, Random  Result  Value Ref Range Status   Specimen Description   Final    URINE, RANDOM Performed at [facility], [address] [place], [place] [address]    Special Requests   Final    NONE Performed at [facility], [address] [place], [place] [address]    Culture   Final    NO GROWTH Performed at [facility], [place] [address] [place], [place] [address]    Report Status 06/27/2021 FINAL  Final      Radiology Studies: No results found.  Scheduled Meds:  arformoterol  15 mcg Nebulization BID   budesonide (PULMICORT) nebulizer solution  0.25 mg Nebulization BID   chlorhexidine  15 mL Mouth Rinse BID   Chlorhexidine Gluconate Cloth  6 each Topical Q0600   enoxaparin (LOVENOX) injection  40 mg Subcutaneous QHS   famotidine  20 mg Per Tube Daily   free water  100 mL Per Tube Q4H   insulin aspart  0-20 Units Subcutaneous Q4H   insulin detemir  10 Units Subcutaneous Q12H   mouth rinse  15 mL Mouth Rinse q12n4p   revefenacin  175 mcg Nebulization Daily   Continuous Infusions:  sodium chloride Stopped (06/29/21 0922)   feeding supplement (OSMOLITE 1.5 CAL) 1,000 mL (07/02/21 0803)   piperacillin-tazobactam (ZOSYN)  IV 3.375 g (07/03/21 0851)     LOS: 12 days   Marylu Lund, MD [facility] Pager On Amion  If 7PM-7AM, please contact night-coverage 07/03/2021, 3:33 PM

## 2021-07-03 NOTE — Plan of Care (Signed)

## 2021-07-04 LAB — COMPREHENSIVE METABOLIC PANEL
ALT: 97 U/L — ABNORMAL HIGH (ref 0–44)
AST: 44 U/L — ABNORMAL HIGH (ref 15–41)
Albumin: 2.6 g/dL — ABNORMAL LOW (ref 3.5–5.0)
Alkaline Phosphatase: 89 U/L (ref 38–126)
Anion gap: 9 (ref 5–15)
BUN: 24 mg/dL — ABNORMAL HIGH (ref 8–23)
CO2: 29 mmol/L (ref 22–32)
Calcium: 9.2 mg/dL (ref 8.9–10.3)
Chloride: 98 mmol/L (ref 98–111)
Creatinine, Ser: 0.96 mg/dL (ref 0.61–1.24)
GFR, Estimated: >60 mL/min (ref >60)
Glucose, Bld: 199 mg/dL — ABNORMAL HIGH (ref 70–99)
Potassium: 4.1 mmol/L (ref 3.5–5.1)
Sodium: 136 mmol/L (ref 135–145)
Total Bilirubin: 0.5 mg/dL (ref 0.3–1.2)
Total Protein: 6.9 g/dL (ref 6.5–8.1)

## 2021-07-04 LAB — GLUCOSE, CAPILLARY
Glucose-Capillary: 45 mg/dL — ABNORMAL LOW (ref 70–99)
Glucose-Capillary: 206 mg/dL — ABNORMAL HIGH (ref 70–99)
Glucose-Capillary: 47 mg/dL — ABNORMAL LOW (ref 70–99)
Glucose-Capillary: 186 mg/dL — ABNORMAL HIGH (ref 70–99)
Glucose-Capillary: 252 mg/dL — ABNORMAL HIGH (ref 70–99)
Glucose-Capillary: 237 mg/dL — ABNORMAL HIGH (ref 70–99)
Glucose-Capillary: 221 mg/dL — ABNORMAL HIGH (ref 70–99)
Glucose-Capillary: 206 mg/dL — ABNORMAL HIGH (ref 70–99)

## 2021-07-04 LAB — CBC
HCT: 32.8 % — ABNORMAL LOW (ref 39.0–52.0)
Hemoglobin: 10.1 g/dL — ABNORMAL LOW (ref 13.0–17.0)
MCH: 24.9 pg — ABNORMAL LOW (ref 26.0–34.0)
MCHC: 30.8 g/dL (ref 30.0–36.0)
MCV: 80.8 fL (ref 80.0–100.0)
Platelets: 418 10*3/uL — ABNORMAL HIGH (ref 150–400)
RBC: 4.06 MIL/uL — ABNORMAL LOW (ref 4.22–5.81)
RDW: 14.8 % (ref 11.5–15.5)
WBC: 10.3 10*3/uL (ref 4.0–10.5)
nRBC: 0 % (ref 0.0–0.2)

## 2021-07-04 MED ORDER — DEXTROSE 50 % IV SOLN
INTRAVENOUS | Status: AC
  Administered 2021-07-04: 50 mL
  Filled 2021-07-04: qty 50

## 2021-07-04 NOTE — TOC Progression Note (Signed)
Transition of Care [facility]) - Progression Note    Patient Details  Name: Louis Stone MRN: [id number] Date of Birth: 03/20/1951  Transition of Care [facility]) CM/SW [place], LCSW Phone Number: 07/04/2021, 11:04 AM  Clinical Narrative:    Checked with RN. Patient on room air. Will not need home o2 as of now.   Expected Discharge Plan: Home/Self Care Barriers to Discharge: Continued Medical Work up  Expected Discharge Plan and Services Expected Discharge Plan: Home/Self Care   Discharge Planning Services: CM Consult   Living arrangements for the past 2 months: Single Family Home                 DME Arranged: N/A DME Agency: NA       HH Arranged: Refused SNF, Refused HH           Social Determinants of Health (SDOH) Interventions    Readmission Risk Interventions Readmission Risk Prevention Plan 07/01/2021 02/03/2021  Transportation Screening Complete Complete  PCP or Specialist Appt within 3-5 Days - Complete  HRI or [place] - Complete  Social Work Consult for [place] Planning/Counseling - Complete  Palliative Care Screening - Not Applicable  Medication Review Press photographer) Complete Complete  PCP or Specialist appointment within 3-5 days of discharge Complete -  Reserve or Home Care Consult Patient refused -  SW Recovery Care/Counseling Consult Complete -  Palliative Care Screening Complete -  [place] Patient Refused -  Some recent data might be hidden

## 2021-07-04 NOTE — Progress Notes (Signed)
Pt tube feeds kept stopping due to a clog. Pt BS was checked, BS 47. Pt given an amp of D50. Rechecked in 30 min, BS 207. Clog was worked on for 30 min and released. Tube feeding resumed. BS rechecked after and hour, BS 187 with tube feed working properly. MD notified of all occurrences.

## 2021-07-04 NOTE — Discharge Summary (Addendum)
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           106 bpm. Exam Location:  [facility] Procedure: 2D Echo, Color Doppler and Cardiac Doppler Indications:     I38 Endocarditis  History:         Patient has prior history of Echocardiogram examinations, most                  recent 07/17/2020. Risk Factors:Diabetes and Current Smoker.  Sonographer:     Charmayne Sheer Referring Phys:  [place] Diagnosing Phys: Neoma Laming  Sonographer Comments: Technically challenging study due to limited acoustic windows, no parasternal window and no subcostal window. TDS due to abdominal bandage with feeding tube. IMPRESSIONS  1. Left ventricular ejection fraction, by estimation, is 55 to 60%. The left ventricle has normal function. The left ventricle has no regional wall motion abnormalities. There is mild left ventricular hypertrophy. Left ventricular diastolic parameters are consistent with Grade I diastolic dysfunction (impaired relaxation).  2. Right ventricular systolic function is normal. The right ventricular size is normal.  3. The mitral valve is normal in structure. No evidence of mitral valve  regurgitation. No evidence of mitral stenosis.  4. The aortic valve is normal in structure. Aortic valve regurgitation is not visualized. No aortic stenosis is present.  5. The inferior vena cava is normal in size with greater than 50% respiratory variability, suggesting right atrial pressure of 3 mmHg. FINDINGS  Left Ventricle: Left ventricular ejection fraction, by estimation, is 55 to 60%. The left ventricle has normal function. The left ventricle has no regional wall motion abnormalities. The left ventricular internal cavity size was normal in size. There is  mild left ventricular hypertrophy. Left ventricular diastolic parameters are consistent with Grade I diastolic dysfunction (impaired relaxation). Right Ventricle: The right ventricular size is normal. No increase in right ventricular wall thickness. Right ventricular systolic function is normal. Left Atrium: Left atrial size was normal in size. Right Atrium: Right atrial size was normal in size. Pericardium: There is no evidence of pericardial effusion. Mitral Valve: The mitral valve is normal in structure. No  evidence of mitral valve regurgitation. No evidence of mitral valve stenosis. MV peak gradient, 2.4 mmHg. The mean mitral valve gradient is 1.0 mmHg. Tricuspid Valve: The tricuspid valve is normal in structure. Tricuspid valve regurgitation is not demonstrated. No evidence of tricuspid stenosis. Aortic Valve: The aortic valve is normal in structure. Aortic valve regurgitation is not visualized. No aortic stenosis is present. Aortic valve mean gradient measures 3.0 mmHg. Aortic valve peak gradient measures 5.7 mmHg. Pulmonic Valve: The pulmonic valve was normal in structure. Pulmonic valve regurgitation is not visualized. No evidence of pulmonic stenosis. Aorta: The aortic root is normal in size and structure. Venous: The inferior vena cava is normal in size with greater than 50% respiratory variability, suggesting right atrial pressure of 3 mmHg.  IAS/Shunts: No atrial level shunt detected by color flow Doppler.   LV Volumes (MOD) LV vol d, MOD A2C: 56.8 ml Diastology LV vol d, MOD A4C: 59.0 ml LV e' medial:    4.24 cm/s LV vol s, MOD A2C: 26.2 ml LV E/e' medial:  12.1 LV vol s, MOD A4C: 27.0 ml LV e' lateral:   5.11 cm/s LV SV MOD A2C:     30.6 ml LV E/e' lateral: 10.0 LV SV MOD A4C:     59.0 ml LV SV MOD BP:      31.2 ml RIGHT VENTRICLE RV S prime:     14.80 cm/s LEFT ATRIUM           Index LA Vol (A2C): 24.3 ml 15.24 ml/m  AORTIC VALVE AV Vmax:           119.00 cm/s AV Vmean:          79.800 cm/s AV VTI:            0.209 m AV Peak Grad:      5.7 mmHg AV Mean Grad:      3.0 mmHg LVOT Vmax:         91.40 cm/s LVOT Vmean:        59.900 cm/s LVOT VTI:          0.137 m LVOT/AV VTI ratio: 0.66 MITRAL VALVE MV Area (PHT): 4.96 cm    SHUNTS MV Peak grad:  2.4 mmHg    Systemic VTI: 0.14 m MV Mean grad:  1.0 mmHg MV Vmax:       0.78 m/s MV Vmean:      53.1 cm/s MV Decel Time: 153 msec MV E velocity: 51.20 cm/s MV A velocity: 80.75 cm/s MV E/A ratio:  0.63 Shaukat Khan Electronically signed by Neoma Laming Signature Date/Time: 06/26/2021/5:53:45 PM    Final    US Abdomen Limited RUQ (LIVER/GB)  Result Date: 06/27/2021 CLINICAL DATA:  Transaminitis. EXAM: ULTRASOUND ABDOMEN LIMITED RIGHT UPPER QUADRANT COMPARISON:  CT 06/22/2021 FINDINGS: Gallbladder: Numerous stones within the gallbladder, the largest 1.4 cm. These appear to be mobile. No Murphy sign. No wall thickening. No surrounding fluid. Common bile duct: Diameter: 3 mm.  Normal. Liver: No focal lesion identified. Within normal limits in parenchymal echogenicity. Portal vein is patent on color Doppler imaging with normal direction of blood flow towards the liver. Other: None. IMPRESSION: Mobile stones within the gallbladder, the largest 1.4 cm. No sonographic evidence of cholecystitis or obstruction however. Electronically Signed   By: Nelson Chimes M.D.   On: 06/27/2021 15:38    Subjective: Eager to go  home  Discharge Exam: Vitals:   07/05/21 0905 07/05/21 1157  BP: 107/66 (!) 146/95  Pulse: 86 88  Resp: 18 14  Temp: 98 F (36.7 C) (!) 97.5 F (36.4 C)  SpO2: 99% 99%   Vitals:   07/05/21 0500 07/05/21 0710 07/05/21 0905 07/05/21 1157  BP:   107/66 (!) 146/95  Pulse:   86 88  Resp:   18 14  Temp:   98 F (36.7 C) (!) 97.5 F (36.4 C)  TempSrc:    Oral  SpO2:  93% 99% 99%  Weight: 53.5 kg     Height:        General: Pt is alert, awake, not in acute distress Cardiovascular: RRR, S1/S2 + Respiratory: CTA bilaterally, no wheezing, no rhonchi Abdominal: Soft, NT, ND, bowel sounds + Extremities: no edema, no cyanosis   The results of significant diagnostics from this hospitalization (including imaging, microbiology, ancillary and laboratory) are listed below for reference.     Microbiology: Recent Results (from the past 240 hour(s))  CULTURE, BLOOD (ROUTINE X 2) w Reflex to ID Panel     Status: None   Collection Time: 06/25/21  8:05 PM   Specimen: BLOOD  Result Value Ref Range Status   Specimen Description BLOOD LEFT ANTECUBITAL  Final   Special Requests   Final    BOTTLES DRAWN AEROBIC AND ANAEROBIC Blood Culture adequate volume   Culture   Final    NO GROWTH 5 DAYS Performed at [facility], [address] [place], [place] [address]    Report Status 06/30/2021 FINAL  Final  CULTURE, BLOOD (ROUTINE X 2) w Reflex to ID Panel     Status: None   Collection Time: 06/25/21  8:08 PM   Specimen: BLOOD  Result Value Ref Range Status   Specimen Description BLOOD BLOOD LEFT HAND  Final   Special Requests   Final    BOTTLES DRAWN AEROBIC AND ANAEROBIC Blood Culture adequate volume   Culture   Final    NO GROWTH 5 DAYS Performed at [facility], [address], [place], [place] [address]    Report Status 06/30/2021 FINAL  Final  Urine Culture     Status: None   Collection Time: 06/25/21  8:52 PM   Specimen: Urine, Random  Result Value Ref Range  Status   Specimen Description   Final    URINE, RANDOM Performed at [facility], [address], [place], [place] [address]    Special Requests   Final    NONE Performed at [facility], [address], [place], [place] [address]    Culture   Final    NO GROWTH Performed at [facility], [place] [address], [place], [place] [address]    Report Status 06/27/2021 FINAL  Final     Labs: BNP (last 3 results) Recent Labs    06/21/21 1244 06/25/21 1937 06/27/21 0417  BNP 107.5* 174.2* 27.7   Basic Metabolic Panel: Recent Labs  Lab 06/29/21 0502 07/02/21 0745 07/03/21 0413 07/04/21 0613  NA 144 136 137 136  K 3.7 4.2 4.0 4.1  CL 106 99 99 98  CO2 33* 31 32 29  GLUCOSE 134* 169* 110* 199*  BUN 33* 25* 25* 24*  CREATININE 0.93 0.90 1.00 0.96  CALCIUM 9.0 9.2 9.2 9.2  MG 2.7*  --   --   --   PHOS 3.3  --   --   --    Liver Function Tests: Recent Labs  Lab 07/02/21 0745 07/03/21 0413 07/04/21 0613  AST 52* 46* 44*  ALT 132* 115*  97*  ALKPHOS 92 90 89  BILITOT 0.5 0.4 0.5  PROT 6.7 6.7 6.9  ALBUMIN 2.6* 2.6* 2.6*   No results for input(s): LIPASE, AMYLASE in the last 168 hours. No results for input(s): AMMONIA in the last 168 hours. CBC: Recent Labs  Lab 06/29/21 0502 07/02/21 0745 07/03/21 0413 07/04/21 0613  WBC 11.0* 7.7 6.4 10.3  HGB 10.6* 10.7* 10.4* 10.1*  HCT 34.6* 34.0* 32.8* 32.8*  MCV 80.7 80.4 80.2 80.8  PLT 235 352 379 418*   Cardiac Enzymes: No results for input(s): CKTOTAL, CKMB, CKMBINDEX, TROPONINI in the last 168 hours. BNP: Invalid input(s): POCBNP CBG: Recent Labs  Lab 07/04/21 2306 07/05/21 0402 07/05/21 0909 07/05/21 1004 07/05/21 1202  GLUCAP 206* 228* 30* 166* 142*   D-Dimer No results for input(s): DDIMER in the last 72 hours. Hgb A1c No results for input(s): HGBA1C in the last 72 hours. Lipid Profile No results for input(s): CHOL, HDL, LDLCALC, TRIG, CHOLHDL, LDLDIRECT in the last 72  hours. Thyroid function studies No results for input(s): TSH, T4TOTAL, T3FREE, THYROIDAB in the last 72 hours.  Invalid input(s): FREET3 Anemia work up No results for input(s): VITAMINB12, FOLATE, FERRITIN, TIBC, IRON, RETICCTPCT in the last 72 hours. Urinalysis    Component Value Date/Time   COLORURINE YELLOW 06/25/2021 2052   APPEARANCEUR CLEAR 06/25/2021 2052   LABSPEC 1.015 06/25/2021 2052   PHURINE 5.0 06/25/2021 2052   GLUCOSEU NEGATIVE 06/25/2021 2052   HGBUR MODERATE (A) 06/25/2021 2052   BILIRUBINUR NEGATIVE 06/25/2021 2052   KETONESUR NEGATIVE 06/25/2021 2052   PROTEINUR NEGATIVE 06/25/2021 2052   NITRITE NEGATIVE 06/25/2021 2052   LEUKOCYTESUR NEGATIVE 06/25/2021 2052   Sepsis Labs Invalid input(s): PROCALCITONIN,  WBC,  LACTICIDVEN Microbiology Recent Results (from the past 240 hour(s))  CULTURE, BLOOD (ROUTINE X 2) w Reflex to ID Panel     Status: None   Collection Time: 06/25/21  8:05 PM   Specimen: BLOOD  Result Value Ref Range Status   Specimen Description BLOOD LEFT ANTECUBITAL  Final   Special Requests   Final    BOTTLES DRAWN AEROBIC AND ANAEROBIC Blood Culture adequate volume   Culture   Final    NO GROWTH 5 DAYS Performed at [facility], [address] [name], [place] [address]    Report Status 06/30/2021 FINAL  Final  CULTURE, BLOOD (ROUTINE X 2) w Reflex to ID Panel     Status: None   Collection Time: 06/25/21  8:08 PM   Specimen: BLOOD  Result Value Ref Range Status   Specimen Description BLOOD BLOOD LEFT HAND  Final   Special Requests   Final    BOTTLES DRAWN AEROBIC AND ANAEROBIC Blood Culture adequate volume   Culture   Final    NO GROWTH 5 DAYS Performed at [facility], [address] [place], [place] [address]    Report Status 06/30/2021 FINAL  Final  Urine Culture     Status: None   Collection Time: 06/25/21  8:52 PM   Specimen: Urine, Random  Result Value Ref Range Status   Specimen Description   Final     URINE, RANDOM Performed at [facility], [address], [place], [place] [address]    Special Requests   Final    NONE Performed at [facility], [address], [place], [place] [address]    Culture   Final    NO GROWTH Performed at [facility], [place] [address] [place], [place]  [address]    Report Status 06/27/2021 FINAL  Final   Time spent: 30 min  SIGNED:   Marylu Lund, MD  [facility] 07/05/2021, 4:17 PM  If 7PM-7AM, please contact night-coverage

## 2021-07-04 NOTE — Progress Notes (Signed)
Patient has no key to home. Patient is being discharged to home. Attempted to call daughter and sister. No answer from daughter, [name], attempted twice. Spoke briefly with Velta Addison then tried to reach her again to find out if anyone was home and no answer. Awaiting return of phone call before discharge.

## 2021-07-04 NOTE — Plan of Care (Signed)

## 2021-07-05 ENCOUNTER — Inpatient Hospital Stay: Payer: Medicare Other

## 2021-07-05 LAB — GLUCOSE, CAPILLARY
Glucose-Capillary: 228 mg/dL — ABNORMAL HIGH (ref 70–99)
Glucose-Capillary: 30 mg/dL — CL (ref 70–99)
Glucose-Capillary: 166 mg/dL — ABNORMAL HIGH (ref 70–99)
Glucose-Capillary: 142 mg/dL — ABNORMAL HIGH (ref 70–99)

## 2021-07-05 MED ORDER — DEXTROSE-NACL 5-0.9 % IV SOLN: INTRAVENOUS | Status: DC

## 2021-07-05 MED ORDER — ACETAMINOPHEN 650 MG RE SUPP
650.0000 mg | RECTAL | Status: DC | PRN
  Administered 2021-07-05: 06:00:00 650 mg via RECTAL
  Filled 2021-07-05: qty 1

## 2021-07-05 MED ORDER — DEXTROSE 50 % IV SOLN
25.0000 g | Freq: Once | INTRAVENOUS | Status: AC
  Administered 2021-07-05: 25 g via INTRAVENOUS
  Filled 2021-07-05: qty 50

## 2021-07-05 MED ORDER — IOHEXOL 300 MG/ML  SOLN
10.0000 mL | Freq: Once | INTRAMUSCULAR | Status: AC | PRN
Start: 2021-07-05 — End: 2021-07-05
  Administered 2021-07-05: 11:00:00 10 mL

## 2021-07-05 MED ORDER — ACETAMINOPHEN 160 MG/5ML PO SOLN
650.0000 mg | ORAL | Status: DC | PRN
  Filled 2021-07-05: qty 20.3

## 2021-07-05 NOTE — Plan of Care (Signed)
°  Problem: Nutrition: Goal: Adequate nutrition will be maintained 07/05/2021 0335 by Lydia Guiles, RN Outcome: Progressing 07/05/2021 0335 by Lydia Guiles, RN Outcome: Progressing   Problem: Safety: Goal: Ability to remain free from injury will improve 07/05/2021 0335 by Lydia Guiles, RN Outcome: Progressing 07/05/2021 0335 by Lydia Guiles, RN Outcome: Progressing

## 2021-07-05 NOTE — Progress Notes (Signed)
HOSPITAL MEDICINE OVERNIGHT EVENT NOTE    Notified by nursing that PEG tube is currently clogged.  Nursing attempting to relieve the obstruction per protocol using various methods.  If unsuccessful, either GI or IR may need to exchange the PEG on day shift.  In the meantime, nursing is requesting a suppository alternative for as needed Tylenol for mild pain.  Order placed.  Vernelle Emerald  MD [facility]

## 2021-07-05 NOTE — Procedures (Signed)
Interventional Radiology Procedure Note  Date of Procedure: 07/05/2021  Procedure: G tube check   Findings:  1. G tube partially clogged, able to open with flushing. G tube is functional and ready for use.    Complications: No immediate complications noted.   Estimated Blood Loss: minimal  Follow-up and Recommendations: 1. Please continue to flush with 68ml water at least x3 daily and always after feeds to reduce the risk of clogging.     Albin Felling, MD  Vascular & Interventional Radiology  07/05/2021 11:23 AM

## 2021-07-05 NOTE — Progress Notes (Signed)
Received MD order to discharge patient to home, reviewed discharge instructions and follow up appointments with patient and patient verbalized understanding

## 2021-07-05 NOTE — Progress Notes (Signed)
PROGRESS NOTE    Louis Stone  BMW:[id number] DOB: 11-Jan-1951 DOA: 06/21/2021 PCP: Theotis Burrow, MD    Brief Narrative:  71 y.o. male with medical history significant of oropharynx cancer, s/p of G-tube placement, diabetes mellitus, hyperlipidemia, GERD, former smoker, aspiration pneumonia, who presents with shortness breath and cough.Pt was admitted for acute hypoxemic failure from aspiration PNA  Assessment & Plan:   Principal Problem:   Aspiration pneumonia ([place]) Active Problems:   Acute metabolic encephalopathy   GERD without esophagitis   Acute respiratory failure with hypoxia (HCC)   Squamous cell carcinoma of oropharynx (HCC)   Acute renal failure (ARF) (HCC)   Protein-calorie malnutrition, severe   Diabetes mellitus without complication (HCC)   Dehydration   Hypernatremia   HLD (hyperlipidemia)   Acute respiratory failure with hypoxemia (HCC)   Pneumoperitoneum of unknown etiology   Pressure injury of skin  1.Acute hypoxic respiratory failure due to aspiration pneumonia Sepsis -ID consulted-Staph hominis and hemolyticus are 2 skin bacteria in the blood culture and are contaminants- no reason to suspect endocarditis- no need for TEE or treatment  Rec.discontinuing vancomycin since MRSA naris negative and hence MRSA pneumonia unlikely.  Completed course of zosyn Has been weaned from [place], presently weaned to room air   2.hx/oEmphysema with prior smoking Transferred to SDU 12/22 critical care consulted Antibiotics broadened to cefepime and vancomycin--procalcitonin 3.2 --->15 do not de-escalate antibiotics at this time  12/27 continue Brovana, pulmicort and nebs Steroids were discontinued. Pt initially required high flow O2, now weaned to minimal o2 support    3.G-tube dependent Adult failure to thrive in the setting of cancer -continue tube feeding as tolerated -PEG became clogged on the evening of 12/21, was successfully unclogged per IR on  1/1/123   4.Hypernatremia      Likely free water deficit     12/26 improved with dextrose infusion . Sodium has normalized   5.Poorly controlled diabetes mellitus with DKA this admission and profound hypoglycemia last admission A1c 6.8 -Now on decreased QHS lantus dose of 10 units, had d/c meal coverage -Continue SSI while in hospital   6.AKI  Cr has normalized Cont to follow bmet trends   7.12 beats of V. tach 0n 12/23    was initially placed on beta blocker, now held secondary to hypotension Mg stable Echo nml ef Cards following-conservative mx. No further w/u. F/u with cards as outpt   8. Hypotension -held beta blocker -Given IVF bolus  -BP has since stabilized    DVT prophylaxis: Lovenox subq Code Status: Full Family Communication: pt in room, family not at bedside  Status is: Inpatient  Remains inpatient appropriate because: Severity of illness   Consultants:  PCCM ID Palliative Care Nephrology General Surgery  Procedures:    Antimicrobials: Anti-infectives (From admission, onward)    Start     Dose/Rate Route Frequency Ordered Stop   06/27/21 1800  piperacillin-tazobactam (ZOSYN) IVPB 3.375 g        3.375 g 12.5 mL/hr over 240 Minutes Intravenous Every 8 hours 06/27/21 1715 07/04/21 0600   06/26/21 2200  vancomycin (VANCOCIN) IVPB 1000 mg/200 mL premix  Status:  Discontinued        1,000 mg 200 mL/hr over 60 Minutes Intravenous Every 24 hours 06/25/21 2019 06/27/21 1710   06/25/21 2100  vancomycin (VANCOREADY) IVPB 1250 mg/250 mL        1,250 mg 166.7 mL/hr over 90 Minutes Intravenous  Once 06/25/21 2006 06/26/21 0112   06/25/21 2030  ceFEPIme (  MAXIPIME) 2 g in sodium chloride 0.9 % 100 mL IVPB  Status:  Discontinued        2 g 200 mL/hr over 30 Minutes Intravenous Every 12 hours 06/25/21 2006 06/27/21 1711   06/25/21 1500  Ampicillin-Sulbactam (UNASYN) 3 g in sodium chloride 0.9 % 100 mL IVPB  Status:  Discontinued        3 g 200 mL/hr over 30  Minutes Intravenous Every 6 hours 06/25/21 1356 06/25/21 1957   06/24/21 2000  amoxicillin-clavulanate (AUGMENTIN) 875-125 MG per tablet 1 tablet  Status:  Discontinued        1 tablet Per Tube Every 12 hours 06/24/21 1912 06/25/21 1353   06/24/21 1800  amoxicillin-clavulanate (AUGMENTIN) 875-125 MG per tablet 1 tablet  Status:  Discontinued        1 tablet Oral Every 12 hours 06/24/21 1635 06/24/21 1912   06/22/21 0300  ceFEPIme (MAXIPIME) 2 g in sodium chloride 0.9 % 100 mL IVPB  Status:  Discontinued        2 g 200 mL/hr over 30 Minutes Intravenous Every 12 hours 06/21/21 1526 06/21/21 1546   06/21/21 2200  Ampicillin-Sulbactam (UNASYN) 3 g in sodium chloride 0.9 % 100 mL IVPB  Status:  Discontinued        3 g 200 mL/hr over 30 Minutes Intravenous Every 6 hours 06/21/21 1551 06/24/21 1635   06/21/21 1600  metroNIDAZOLE (FLAGYL) IVPB 500 mg  Status:  Discontinued        500 mg 100 mL/hr over 60 Minutes Intravenous Every 12 hours 06/21/21 1511 06/21/21 1546   06/21/21 1500  vancomycin (VANCOCIN) IVPB 1000 mg/200 mL premix  Status:  Discontinued        1,000 mg 200 mL/hr over 60 Minutes Intravenous  Once 06/21/21 1447 06/21/21 1455   06/21/21 1500  ceFEPIme (MAXIPIME) 2 g in sodium chloride 0.9 % 100 mL IVPB        2 g 200 mL/hr over 30 Minutes Intravenous  Once 06/21/21 1447 06/21/21 1548   06/21/21 1500  vancomycin (VANCOREADY) IVPB 1250 mg/250 mL  Status:  Discontinued        1,250 mg 166.7 mL/hr over 90 Minutes Intravenous  Once 06/21/21 1455 06/21/21 1546       Subjective: Eager for d/c today  Objective: Vitals:   07/05/21 0500 07/05/21 0710 07/05/21 0905 07/05/21 1157  BP:   107/66 (!) 146/95  Pulse:   86 88  Resp:   18 14  Temp:   98 F (36.7 C) (!) 97.5 F (36.4 C)  TempSrc:    Oral  SpO2:  93% 99% 99%  Weight: 53.5 kg     Height:        Intake/Output Summary (Last 24 hours) at 07/05/2021 1338 Last data filed at 07/05/2021 0356 Gross per 24 hour  Intake 1521 ml   Output 950 ml  Net 571 ml    Filed Weights   07/02/21 1100 07/04/21 0500 07/05/21 0500  Weight: 54.1 kg 54.6 kg 53.5 kg    Examination: General exam: Conversant, in no acute distress Respiratory system: normal chest rise, clear, no audible wheezing Cardiovascular system: regular rhythm, s1-s2 Gastrointestinal system: Nondistended, nontender, pos BS, PEG in place Central nervous system: No seizures, no tremors Extremities: No cyanosis, no joint deformities Skin: No rashes, no pallor Psychiatry: Affect normal // no auditory hallucinations   Data Reviewed: I have personally reviewed following labs and imaging studies  CBC: Recent Labs  Lab 06/29/21 0502 07/02/21  0745 07/03/21 0413 07/04/21 0613  WBC 11.0* 7.7 6.4 10.3  HGB 10.6* 10.7* 10.4* 10.1*  HCT 34.6* 34.0* 32.8* 32.8*  MCV 80.7 80.4 80.2 80.8  PLT 235 352 379 418*    Basic Metabolic Panel: Recent Labs  Lab 06/29/21 0502 07/02/21 0745 07/03/21 0413 07/04/21 0613  NA 144 136 137 136  K 3.7 4.2 4.0 4.1  CL 106 99 99 98  CO2 33* 31 32 29  GLUCOSE 134* 169* 110* 199*  BUN 33* 25* 25* 24*  CREATININE 0.93 0.90 1.00 0.96  CALCIUM 9.0 9.2 9.2 9.2  MG 2.7*  --   --   --   PHOS 3.3  --   --   --     GFR: Estimated Creatinine Clearance: 55 mL/min (by C-G formula based on SCr of 0.96 mg/dL). Liver Function Tests: Recent Labs  Lab 07/02/21 0745 07/03/21 0413 07/04/21 0613  AST 52* 46* 44*  ALT 132* 115* 97*  ALKPHOS 92 90 89  BILITOT 0.5 0.4 0.5  PROT 6.7 6.7 6.9  ALBUMIN 2.6* 2.6* 2.6*    No results for input(s): LIPASE, AMYLASE in the last 168 hours. No results for input(s): AMMONIA in the last 168 hours. Coagulation Profile: No results for input(s): INR, PROTIME in the last 168 hours.  Cardiac Enzymes: No results for input(s): CKTOTAL, CKMB, CKMBINDEX, TROPONINI in the last 168 hours.  BNP (last 3 results) No results for input(s): PROBNP in the last 8760 hours. HbA1C: No results for  input(s): HGBA1C in the last 72 hours. CBG: Recent Labs  Lab 07/04/21 2306 07/05/21 0402 07/05/21 0909 07/05/21 1004 07/05/21 1202  GLUCAP 206* 228* 30* 166* 142*    Lipid Profile: No results for input(s): CHOL, HDL, LDLCALC, TRIG, CHOLHDL, LDLDIRECT in the last 72 hours. Thyroid Function Tests: No results for input(s): TSH, T4TOTAL, FREET4, T3FREE, THYROIDAB in the last 72 hours. Anemia Panel: No results for input(s): VITAMINB12, FOLATE, FERRITIN, TIBC, IRON, RETICCTPCT in the last 72 hours. Sepsis Labs: No results for input(s): PROCALCITON, LATICACIDVEN in the last 168 hours.   Recent Results (from the past 240 hour(s))  CULTURE, BLOOD (ROUTINE X 2) w Reflex to ID Panel     Status: None   Collection Time: 06/25/21  8:05 PM   Specimen: BLOOD  Result Value Ref Range Status   Specimen Description BLOOD LEFT ANTECUBITAL  Final   Special Requests   Final    BOTTLES DRAWN AEROBIC AND ANAEROBIC Blood Culture adequate volume   Culture   Final    NO GROWTH 5 DAYS Performed at [facility], [address] [place], [place] [address]    Report Status 06/30/2021 FINAL  Final  CULTURE, BLOOD (ROUTINE X 2) w Reflex to ID Panel     Status: None   Collection Time: 06/25/21  8:08 PM   Specimen: BLOOD  Result Value Ref Range Status   Specimen Description BLOOD BLOOD LEFT HAND  Final   Special Requests   Final    BOTTLES DRAWN AEROBIC AND ANAEROBIC Blood Culture adequate volume   Culture   Final    NO GROWTH 5 DAYS Performed at [facility], [address] [place], [place] [address]    Report Status 06/30/2021 FINAL  Final  Urine Culture     Status: None   Collection Time: 06/25/21  8:52 PM   Specimen: Urine, Random  Result Value Ref Range Status   Specimen Description   Final    URINE, RANDOM Performed  at [facility], [address] [place], [place] [address]    Special Requests   Final    NONE Performed at [facility], [address] [place], [place] [address]    Culture   Final    NO GROWTH Performed at [facility], [place] [address], [place], [place] [address]    Report Status 06/27/2021 FINAL  Final      Radiology Studies: DG Replc Gastro/Jejuno Tube Percut W/Fluoro  Result Date: 07/05/2021 INDICATION: Clogged gastrostomy tube EXAM: Gastrostomy tube check using fluoroscopy MEDICATIONS: None ANESTHESIA/SEDATION: None CONTRAST:  10 mL-administered into the gastric lumen. FLUOROSCOPY TIME:  Fluoroscopy Time: 0.2 minutes with 2 exposures COMPLICATIONS: None immediate. PROCEDURE: Informed written consent was obtained from the patient after a thorough discussion of the procedural risks, benefits and alternatives. All questions were addressed. Maximal Sterile Barrier Technique was utilized including caps, mask, sterile gowns, sterile gloves, sterile drape, hand hygiene and skin antiseptic. A timeout was performed prior to the initiation of the procedure. The patient was placed supine on the exam table. Inspection of the gastrostomy tube demonstrated no external defects. The gastrostomy tube was able to be flushed using 10 mL of sterile saline. The gastrostomy tube was copiously irrigated to ensure appropriate function. It was then injected with approximately 10 mL of iodinated contrast material to ensure appropriate positioning of the gastrostomy tube within the gastric lumen. Injection of contrast material demonstrated appropriate opacification of the stomach. The gastrostomy tube was then again flushed with sterile water, and was capped. The patient tolerated the procedure well without immediate complication. IMPRESSION: Partially clogged gastrostomy tube was able to be flushed open. The gastrostomy tube is patent and in appropriate location, and is ready for immediate use. Recommend continuing to flush the gastrostomy tube with 20 mL water at least 3 times daily and always after feeding to minimize the risk of clogging.  Electronically Signed   By: Albin Felling M.D.   On: 07/05/2021 11:52    Scheduled Meds:  arformoterol  15 mcg Nebulization BID   budesonide (PULMICORT) nebulizer solution  0.25 mg Nebulization BID   chlorhexidine  15 mL Mouth Rinse BID   Chlorhexidine Gluconate Cloth  6 each Topical Q0600   enoxaparin (LOVENOX) injection  40 mg Subcutaneous QHS   famotidine  20 mg Per Tube Daily   free water  100 mL Per Tube Q4H   insulin aspart  0-20 Units Subcutaneous Q4H   mouth rinse  15 mL Mouth Rinse q12n4p   revefenacin  175 mcg Nebulization Daily   Continuous Infusions:  sodium chloride Stopped (06/29/21 0922)   feeding supplement (OSMOLITE 1.5 CAL) Stopped (07/05/21 0405)     LOS: 14 days   Marylu Lund, MD [facility] Pager On Amion  If 7PM-7AM, please contact night-coverage 07/05/2021, 1:38 PM

## 2021-07-05 NOTE — TOC Progression Note (Addendum)
Transition of Care [facility]) - Progression Note    Patient Details  Name: Louis Stone MRN: [id number] Date of Birth: 09-09-1950  Transition of Care [facility]) CM/SW [place], LCSW Phone Number: 07/05/2021, 8:45 AM  Clinical Narrative:   Patient did not have a house key yesterday to [place] home and floor staff could not reach any family that could pick him up or let him in the house. TOC Supervisor was made aware yesterday.  1:20-  Patient ready for DC.  Called daughter, Louis Stone @ [phone]. She answered immediately and stated she lives with patient. She or her daughter will be home to let patient in to the home when he is DC today. Tiffany stated her car is broken and there is no one who can pick patient up. Requesting a ride. Confirmed address is [place].  CSW checked with MD and RN, patient not appropriate for [facility] due to having a PEG and running tube feeds. More appropriate for EMS per MD. EMS paperwork completed. [facility] arranged for pick up at 2:30 or later pending truck availability, RN notified. Patient wanting clothes refusing to transport home in hospital gown. CSW will provide clothes from clothing closet.     Expected Discharge Plan: Home/Self Care Barriers to Discharge: Continued Medical Work up  Expected Discharge Plan and Services Expected Discharge Plan: Home/Self Care   Discharge Planning Services: CM Consult   Living arrangements for the past 2 months: Single Family Home Expected Discharge Date: 07/04/21               DME Arranged: N/A DME Agency: NA       HH Arranged: Refused SNF, Refused HH           Social Determinants of Health (SDOH) Interventions    Readmission Risk Interventions Readmission Risk Prevention Plan 07/01/2021 02/03/2021  Transportation Screening Complete Complete  PCP or Specialist Appt within 3-5 Days - Complete  HRI or [place] - Complete  Social Work Consult for [place] Planning/Counseling  - Complete  Palliative Care Screening - Not Applicable  Medication Review Press photographer) Complete Complete  PCP or Specialist appointment within 3-5 days of discharge Complete -  [place] or Home Care Consult Patient refused -  SW Recovery Care/Counseling Consult Complete -  Palliative Care Screening Complete -  [place] Patient Refused -  Some recent data might be hidden

## 2021-08-05 DEATH — deceased

## 2021-10-28 ENCOUNTER — Inpatient Hospital Stay: Payer: Self-pay | Admitting: Oncology

## 2021-10-28 ENCOUNTER — Inpatient Hospital Stay: Payer: Self-pay | Attending: Oncology
